# Patient Record
Sex: Male | Born: 1935
Health system: Southern US, Community
[De-identification: ages and names within clinical notes are randomized; demographics above are authoritative.]

## PROBLEM LIST (undated history)

## (undated) DIAGNOSIS — I2510 Atherosclerotic heart disease of native coronary artery without angina pectoris: Secondary | ICD-10-CM

## (undated) DIAGNOSIS — E785 Hyperlipidemia, unspecified: Secondary | ICD-10-CM

## (undated) DIAGNOSIS — I4891 Unspecified atrial fibrillation: Secondary | ICD-10-CM

## (undated) DIAGNOSIS — I10 Essential (primary) hypertension: Secondary | ICD-10-CM

## (undated) DIAGNOSIS — I495 Sick sinus syndrome: Secondary | ICD-10-CM

## (undated) DIAGNOSIS — R001 Bradycardia, unspecified: Secondary | ICD-10-CM

## (undated) HISTORY — DX: Atherosclerotic heart disease of native coronary artery without angina pectoris: I25.10

## (undated) HISTORY — DX: Bradycardia, unspecified: R00.1

## (undated) HISTORY — PX: CORONARY ARTERY BYPASS GRAFT: SHX141

## (undated) HISTORY — DX: Sick sinus syndrome: I49.5

## (undated) HISTORY — DX: Essential (primary) hypertension: I10

## (undated) HISTORY — DX: Hyperlipidemia, unspecified: E78.5

## (undated) HISTORY — PX: CARDIAC SURGERY: SHX584

## (undated) HISTORY — DX: Unspecified atrial fibrillation: I48.91

## (undated) HISTORY — PX: TONSILLECTOMY AND ADENOIDECTOMY: SUR1326

---

## 1999-03-16 ENCOUNTER — Encounter: Payer: Self-pay | Admitting: Internal Medicine

## 1999-03-16 ENCOUNTER — Inpatient Hospital Stay (HOSPITAL_COMMUNITY): Admission: EM | Admit: 1999-03-16 | Discharge: 1999-03-19 | Payer: Self-pay | Admitting: Emergency Medicine

## 1999-06-02 ENCOUNTER — Encounter (HOSPITAL_COMMUNITY): Admission: RE | Admit: 1999-06-02 | Discharge: 1999-08-31 | Payer: Self-pay | Admitting: *Deleted

## 1999-06-22 ENCOUNTER — Encounter: Payer: Self-pay | Admitting: Emergency Medicine

## 1999-06-22 ENCOUNTER — Inpatient Hospital Stay (HOSPITAL_COMMUNITY): Admission: EM | Admit: 1999-06-22 | Discharge: 1999-06-24 | Payer: Self-pay

## 1999-09-01 ENCOUNTER — Encounter (HOSPITAL_COMMUNITY): Admission: RE | Admit: 1999-09-01 | Discharge: 1999-09-12 | Payer: Self-pay | Admitting: *Deleted

## 1999-11-10 ENCOUNTER — Inpatient Hospital Stay (HOSPITAL_COMMUNITY): Admission: EM | Admit: 1999-11-10 | Discharge: 1999-11-12 | Payer: Self-pay | Admitting: Emergency Medicine

## 1999-11-16 ENCOUNTER — Encounter: Payer: Self-pay | Admitting: Surgery

## 1999-11-16 ENCOUNTER — Inpatient Hospital Stay (HOSPITAL_COMMUNITY): Admission: RE | Admit: 1999-11-16 | Discharge: 1999-11-21 | Payer: Self-pay | Admitting: Surgery

## 1999-11-17 ENCOUNTER — Encounter: Payer: Self-pay | Admitting: Surgery

## 1999-11-18 ENCOUNTER — Encounter: Payer: Self-pay | Admitting: Surgery

## 1999-12-22 ENCOUNTER — Encounter (HOSPITAL_COMMUNITY): Admission: RE | Admit: 1999-12-22 | Discharge: 2000-03-21 | Payer: Self-pay | Admitting: *Deleted

## 2001-05-20 ENCOUNTER — Emergency Department (HOSPITAL_COMMUNITY): Admission: EM | Admit: 2001-05-20 | Discharge: 2001-05-20 | Payer: Self-pay | Admitting: Emergency Medicine

## 2003-11-14 ENCOUNTER — Ambulatory Visit: Payer: Self-pay | Admitting: Psychology

## 2004-04-09 ENCOUNTER — Ambulatory Visit: Payer: Self-pay | Admitting: Internal Medicine

## 2004-04-14 ENCOUNTER — Ambulatory Visit: Payer: Self-pay | Admitting: *Deleted

## 2004-05-18 ENCOUNTER — Ambulatory Visit: Payer: Self-pay | Admitting: Internal Medicine

## 2004-05-25 ENCOUNTER — Ambulatory Visit: Payer: Self-pay | Admitting: Internal Medicine

## 2004-09-24 ENCOUNTER — Ambulatory Visit: Payer: Self-pay | Admitting: *Deleted

## 2004-09-29 ENCOUNTER — Ambulatory Visit: Payer: Self-pay | Admitting: *Deleted

## 2004-09-29 ENCOUNTER — Ambulatory Visit: Payer: Self-pay

## 2005-03-17 ENCOUNTER — Ambulatory Visit: Payer: Self-pay | Admitting: *Deleted

## 2005-03-17 ENCOUNTER — Ambulatory Visit: Payer: Self-pay | Admitting: Internal Medicine

## 2005-03-23 ENCOUNTER — Ambulatory Visit: Payer: Self-pay | Admitting: *Deleted

## 2005-05-04 ENCOUNTER — Ambulatory Visit: Payer: Self-pay | Admitting: Internal Medicine

## 2005-09-16 ENCOUNTER — Ambulatory Visit: Payer: Self-pay | Admitting: Internal Medicine

## 2005-09-20 ENCOUNTER — Ambulatory Visit: Payer: Self-pay | Admitting: *Deleted

## 2006-04-07 ENCOUNTER — Ambulatory Visit: Payer: Self-pay | Admitting: *Deleted

## 2006-04-12 ENCOUNTER — Ambulatory Visit: Payer: Self-pay | Admitting: *Deleted

## 2006-04-12 LAB — CONVERTED CEMR LAB
ALT: 20 units/L (ref 0–40)
AST: 24 units/L (ref 0–37)
Alkaline Phosphatase: 82 units/L (ref 39–117)
BUN: 12 mg/dL (ref 6–23)
Bilirubin, Direct: 0.2 mg/dL (ref 0.0–0.3)
CO2: 27 meq/L (ref 19–32)
Calcium: 9.5 mg/dL (ref 8.4–10.5)
Chloride: 106 meq/L (ref 96–112)
Cholesterol: 163 mg/dL (ref 0–200)
Creatinine, Ser: 1.1 mg/dL (ref 0.4–1.5)
Glucose, Bld: 101 mg/dL — ABNORMAL HIGH (ref 70–99)
Total Protein: 6.5 g/dL (ref 6.0–8.3)

## 2006-05-12 ENCOUNTER — Ambulatory Visit: Payer: Self-pay | Admitting: Internal Medicine

## 2006-06-09 ENCOUNTER — Ambulatory Visit: Payer: Self-pay | Admitting: *Deleted

## 2006-06-09 LAB — CONVERTED CEMR LAB
AST: 28 units/L (ref 0–37)
Albumin: 4 g/dL (ref 3.5–5.2)
Alkaline Phosphatase: 91 units/L (ref 39–117)
Total Bilirubin: 1.3 mg/dL — ABNORMAL HIGH (ref 0.3–1.2)
VLDL: 16 mg/dL (ref 0–40)

## 2006-07-12 ENCOUNTER — Ambulatory Visit: Payer: Self-pay | Admitting: Internal Medicine

## 2006-09-15 ENCOUNTER — Ambulatory Visit: Payer: Self-pay | Admitting: Internal Medicine

## 2006-09-22 ENCOUNTER — Ambulatory Visit: Payer: Self-pay

## 2006-09-22 LAB — CONVERTED CEMR LAB
AST: 31 units/L (ref 0–37)
Albumin: 4.4 g/dL (ref 3.5–5.2)
Bilirubin, Direct: 0.2 mg/dL (ref 0.0–0.3)
Calcium: 9.7 mg/dL (ref 8.4–10.5)
Chloride: 109 meq/L (ref 96–112)
Cholesterol: 149 mg/dL (ref 0–200)
GFR calc Af Amer: 93 mL/min
GFR calc non Af Amer: 77 mL/min
Glucose, Bld: 114 mg/dL — ABNORMAL HIGH (ref 70–99)
HDL: 61.9 mg/dL (ref >39.0)
LDL Cholesterol: 73 mg/dL (ref 0–99)
Sodium: 144 meq/L (ref 135–145)
Total CHOL/HDL Ratio: 2.4
VLDL: 14 mg/dL (ref 0–40)

## 2006-10-21 ENCOUNTER — Ambulatory Visit: Payer: Self-pay | Admitting: Internal Medicine

## 2006-10-21 LAB — CONVERTED CEMR LAB
BUN: 11 mg/dL (ref 6–23)
CO2: 27 meq/L (ref 19–32)
Chloride: 110 meq/L (ref 96–112)
Creatinine, Ser: 1.1 mg/dL (ref 0.4–1.5)

## 2006-12-01 ENCOUNTER — Telehealth: Payer: Self-pay | Admitting: Internal Medicine

## 2006-12-09 ENCOUNTER — Encounter: Payer: Self-pay | Admitting: Internal Medicine

## 2006-12-09 ENCOUNTER — Ambulatory Visit: Payer: Self-pay | Admitting: Internal Medicine

## 2006-12-09 DIAGNOSIS — I252 Old myocardial infarction: Secondary | ICD-10-CM

## 2006-12-09 DIAGNOSIS — E782 Mixed hyperlipidemia: Secondary | ICD-10-CM | POA: Insufficient documentation

## 2006-12-09 DIAGNOSIS — E785 Hyperlipidemia, unspecified: Secondary | ICD-10-CM

## 2006-12-09 DIAGNOSIS — I2510 Atherosclerotic heart disease of native coronary artery without angina pectoris: Secondary | ICD-10-CM | POA: Insufficient documentation

## 2006-12-09 DIAGNOSIS — R279 Unspecified lack of coordination: Secondary | ICD-10-CM

## 2006-12-09 DIAGNOSIS — I10 Essential (primary) hypertension: Secondary | ICD-10-CM

## 2006-12-15 ENCOUNTER — Encounter: Admission: RE | Admit: 2006-12-15 | Discharge: 2006-12-15 | Payer: Self-pay | Admitting: Internal Medicine

## 2006-12-15 ENCOUNTER — Encounter: Payer: Self-pay | Admitting: Internal Medicine

## 2006-12-20 ENCOUNTER — Telehealth (INDEPENDENT_AMBULATORY_CARE_PROVIDER_SITE_OTHER): Payer: Self-pay | Admitting: *Deleted

## 2006-12-22 ENCOUNTER — Telehealth: Payer: Self-pay | Admitting: Internal Medicine

## 2006-12-23 ENCOUNTER — Encounter: Payer: Self-pay | Admitting: Internal Medicine

## 2007-04-06 ENCOUNTER — Ambulatory Visit: Payer: Self-pay | Admitting: Internal Medicine

## 2007-04-07 ENCOUNTER — Ambulatory Visit: Payer: Self-pay | Admitting: Internal Medicine

## 2007-04-07 LAB — CONVERTED CEMR LAB
AST: 26 units/L (ref 0–37)
Albumin: 3.6 g/dL (ref 3.5–5.2)
BUN: 14 mg/dL (ref 6–23)
Calcium: 9.1 mg/dL (ref 8.4–10.5)
Chloride: 112 meq/L (ref 96–112)
Cholesterol: 154 mg/dL (ref 0–200)
Creatinine, Ser: 1 mg/dL (ref 0.4–1.5)
GFR calc Af Amer: 93 mL/min
GFR calc non Af Amer: 77 mL/min
HDL: 61.8 mg/dL (ref >39.0)
LDL Cholesterol: 81 mg/dL (ref 0–99)
Total Bilirubin: 0.9 mg/dL (ref 0.3–1.2)
Total CHOL/HDL Ratio: 2.5
Triglycerides: 54 mg/dL (ref 0–149)
VLDL: 11 mg/dL (ref 0–40)

## 2007-05-26 ENCOUNTER — Ambulatory Visit: Payer: Self-pay | Admitting: Internal Medicine

## 2007-05-26 DIAGNOSIS — I872 Venous insufficiency (chronic) (peripheral): Secondary | ICD-10-CM | POA: Insufficient documentation

## 2007-05-30 ENCOUNTER — Telehealth: Payer: Self-pay | Admitting: Internal Medicine

## 2007-06-12 ENCOUNTER — Ambulatory Visit: Payer: Self-pay | Admitting: Internal Medicine

## 2007-06-12 LAB — CONVERTED CEMR LAB
ALT: 19 units/L (ref 0–53)
Albumin: 3.9 g/dL (ref 3.5–5.2)
Bilirubin, Direct: 0.2 mg/dL (ref 0.0–0.3)
Cholesterol: 147 mg/dL (ref 0–200)
Total Protein: 6.7 g/dL (ref 6.0–8.3)
Triglycerides: 91 mg/dL (ref 0–149)
VLDL: 18 mg/dL (ref 0–40)

## 2007-07-19 ENCOUNTER — Telehealth (INDEPENDENT_AMBULATORY_CARE_PROVIDER_SITE_OTHER): Payer: Self-pay | Admitting: *Deleted

## 2007-07-20 ENCOUNTER — Ambulatory Visit: Payer: Self-pay | Admitting: Internal Medicine

## 2007-07-25 ENCOUNTER — Encounter: Payer: Self-pay | Admitting: Internal Medicine

## 2007-07-25 LAB — CONVERTED CEMR LAB: Creatinine, Urine: 72.8 mg/dL

## 2007-07-27 ENCOUNTER — Encounter: Payer: Self-pay | Admitting: Internal Medicine

## 2007-07-31 ENCOUNTER — Ambulatory Visit: Payer: Self-pay

## 2007-07-31 ENCOUNTER — Encounter: Payer: Self-pay | Admitting: Internal Medicine

## 2007-08-06 ENCOUNTER — Encounter: Payer: Self-pay | Admitting: Internal Medicine

## 2007-08-14 ENCOUNTER — Ambulatory Visit: Payer: Self-pay | Admitting: Internal Medicine

## 2007-08-14 LAB — CONVERTED CEMR LAB
Albumin: 4.1 g/dL (ref 3.5–5.2)
Bilirubin, Direct: 0.2 mg/dL (ref 0.0–0.3)
Cholesterol: 160 mg/dL (ref 0–200)
LDL Cholesterol: 88 mg/dL (ref 0–99)
Total CHOL/HDL Ratio: 3.2
Total Protein: 6.7 g/dL (ref 6.0–8.3)
Triglycerides: 106 mg/dL (ref 0–149)
VLDL: 21 mg/dL (ref 0–40)

## 2007-09-21 ENCOUNTER — Ambulatory Visit: Payer: Self-pay | Admitting: Internal Medicine

## 2007-09-28 ENCOUNTER — Ambulatory Visit: Payer: Self-pay | Admitting: Internal Medicine

## 2007-09-28 LAB — CONVERTED CEMR LAB
Chloride: 105 meq/L (ref 96–112)
GFR calc non Af Amer: 69 mL/min
Potassium: 4.3 meq/L (ref 3.5–5.1)

## 2007-12-21 ENCOUNTER — Telehealth (INDEPENDENT_AMBULATORY_CARE_PROVIDER_SITE_OTHER): Payer: Self-pay | Admitting: *Deleted

## 2007-12-25 ENCOUNTER — Ambulatory Visit: Payer: Self-pay | Admitting: Internal Medicine

## 2008-03-21 ENCOUNTER — Ambulatory Visit: Payer: Self-pay | Admitting: Internal Medicine

## 2008-03-21 ENCOUNTER — Encounter: Payer: Self-pay | Admitting: Internal Medicine

## 2008-03-21 DIAGNOSIS — I498 Other specified cardiac arrhythmias: Secondary | ICD-10-CM

## 2008-03-29 ENCOUNTER — Ambulatory Visit: Payer: Self-pay | Admitting: Internal Medicine

## 2008-04-02 LAB — CONVERTED CEMR LAB
AST: 29 units/L (ref 0–37)
Albumin: 4 g/dL (ref 3.5–5.2)
CO2: 31 meq/L (ref 19–32)
Chloride: 106 meq/L (ref 96–112)
HDL: 53.8 mg/dL (ref >39.00)
LDL Cholesterol: 88 mg/dL (ref 0–99)
Sodium: 144 meq/L (ref 135–145)
Total CHOL/HDL Ratio: 3
Triglycerides: 75 mg/dL (ref 0.0–149.0)
VLDL: 15 mg/dL (ref 0.0–40.0)

## 2008-05-16 ENCOUNTER — Telehealth (INDEPENDENT_AMBULATORY_CARE_PROVIDER_SITE_OTHER): Payer: Self-pay | Admitting: *Deleted

## 2008-06-16 ENCOUNTER — Inpatient Hospital Stay (HOSPITAL_COMMUNITY): Admission: EM | Admit: 2008-06-16 | Discharge: 2008-06-17 | Payer: Self-pay | Admitting: Emergency Medicine

## 2008-06-16 ENCOUNTER — Ambulatory Visit: Payer: Self-pay | Admitting: Cardiology

## 2008-06-16 ENCOUNTER — Telehealth: Payer: Self-pay | Admitting: Adult Health

## 2008-06-18 ENCOUNTER — Ambulatory Visit: Payer: Self-pay | Admitting: Cardiology

## 2008-06-24 ENCOUNTER — Ambulatory Visit: Payer: Self-pay | Admitting: Cardiology

## 2008-06-24 ENCOUNTER — Encounter (INDEPENDENT_AMBULATORY_CARE_PROVIDER_SITE_OTHER): Payer: Self-pay | Admitting: Cardiology

## 2008-06-24 LAB — CONVERTED CEMR LAB: POC INR: 1.9

## 2008-06-27 ENCOUNTER — Ambulatory Visit: Payer: Self-pay | Admitting: Internal Medicine

## 2008-06-27 DIAGNOSIS — I4891 Unspecified atrial fibrillation: Secondary | ICD-10-CM

## 2008-07-01 ENCOUNTER — Ambulatory Visit: Payer: Self-pay | Admitting: Internal Medicine

## 2008-07-01 LAB — CONVERTED CEMR LAB
POC INR: 5.8
Protime: 29.4

## 2008-07-08 ENCOUNTER — Ambulatory Visit: Payer: Self-pay | Admitting: Cardiovascular Disease

## 2008-07-11 ENCOUNTER — Telehealth: Payer: Self-pay | Admitting: Internal Medicine

## 2008-07-16 ENCOUNTER — Encounter (INDEPENDENT_AMBULATORY_CARE_PROVIDER_SITE_OTHER): Payer: Self-pay | Admitting: Cardiology

## 2008-07-16 ENCOUNTER — Ambulatory Visit: Payer: Self-pay | Admitting: Cardiology

## 2008-07-16 LAB — CONVERTED CEMR LAB: POC INR: 2.2

## 2008-07-23 ENCOUNTER — Ambulatory Visit: Payer: Self-pay | Admitting: Internal Medicine

## 2008-07-23 LAB — CONVERTED CEMR LAB
Albumin: 4 g/dL (ref 3.5–5.2)
BUN: 13 mg/dL (ref 6–23)
Bilirubin, Direct: 0.2 mg/dL (ref 0.0–0.3)
Chloride: 106 meq/L (ref 96–112)
Cholesterol: 153 mg/dL (ref 0–200)
LDL Cholesterol: 74 mg/dL (ref 0–99)
Potassium: 5.5 meq/L — ABNORMAL HIGH (ref 3.5–5.1)
TSH: 1.03 microintl units/mL (ref 0.35–5.50)
Total Protein: 6.9 g/dL (ref 6.0–8.3)
VLDL: 20.6 mg/dL (ref 0.0–40.0)

## 2008-08-01 ENCOUNTER — Ambulatory Visit: Payer: Self-pay | Admitting: Cardiovascular Disease

## 2008-08-01 LAB — CONVERTED CEMR LAB
POC INR: 2
Prothrombin Time: 17.3 s

## 2008-08-07 ENCOUNTER — Ambulatory Visit: Payer: Self-pay | Admitting: Internal Medicine

## 2008-08-22 ENCOUNTER — Ambulatory Visit: Payer: Self-pay | Admitting: Internal Medicine

## 2008-09-04 ENCOUNTER — Ambulatory Visit: Payer: Self-pay | Admitting: Internal Medicine

## 2008-09-19 ENCOUNTER — Ambulatory Visit: Payer: Self-pay | Admitting: Cardiology

## 2008-10-17 ENCOUNTER — Ambulatory Visit: Payer: Self-pay | Admitting: Cardiology

## 2008-10-17 LAB — CONVERTED CEMR LAB: POC INR: 2.8

## 2008-11-14 ENCOUNTER — Ambulatory Visit: Payer: Self-pay | Admitting: Internal Medicine

## 2008-12-12 ENCOUNTER — Ambulatory Visit: Payer: Self-pay | Admitting: Cardiology

## 2009-01-09 ENCOUNTER — Encounter (INDEPENDENT_AMBULATORY_CARE_PROVIDER_SITE_OTHER): Payer: Self-pay | Admitting: Cardiology

## 2009-01-09 ENCOUNTER — Ambulatory Visit: Payer: Self-pay | Admitting: Cardiovascular Disease

## 2009-01-09 LAB — CONVERTED CEMR LAB: POC INR: 2.7

## 2009-01-29 ENCOUNTER — Ambulatory Visit: Payer: Self-pay | Admitting: Cardiology

## 2009-01-29 LAB — CONVERTED CEMR LAB: POC INR: 2.6

## 2009-02-18 ENCOUNTER — Ambulatory Visit: Payer: Self-pay | Admitting: Internal Medicine

## 2009-02-19 ENCOUNTER — Ambulatory Visit: Payer: Self-pay | Admitting: Internal Medicine

## 2009-02-26 LAB — CONVERTED CEMR LAB
Albumin: 3.9 g/dL (ref 3.5–5.2)
Chloride: 102 meq/L (ref 96–112)
Cholesterol: 173 mg/dL (ref 0–200)
HDL: 63.7 mg/dL (ref >39.00)
LDL Cholesterol: 96 mg/dL (ref 0–99)
Potassium: 4.6 meq/L (ref 3.5–5.1)
Sodium: 140 meq/L (ref 135–145)
Total Protein: 6.6 g/dL (ref 6.0–8.3)
Triglycerides: 69 mg/dL (ref 0.0–149.0)
VLDL: 13.8 mg/dL (ref 0.0–40.0)

## 2009-02-27 ENCOUNTER — Ambulatory Visit: Payer: Self-pay | Admitting: Cardiology

## 2009-02-27 ENCOUNTER — Telehealth: Payer: Self-pay | Admitting: Internal Medicine

## 2009-03-07 ENCOUNTER — Telehealth: Payer: Self-pay | Admitting: Internal Medicine

## 2009-03-18 ENCOUNTER — Telehealth: Payer: Self-pay | Admitting: Internal Medicine

## 2009-03-27 ENCOUNTER — Encounter (INDEPENDENT_AMBULATORY_CARE_PROVIDER_SITE_OTHER): Payer: Self-pay | Admitting: Cardiology

## 2009-03-27 ENCOUNTER — Ambulatory Visit: Payer: Self-pay | Admitting: Internal Medicine

## 2009-03-27 ENCOUNTER — Ambulatory Visit: Payer: Self-pay | Admitting: Cardiology

## 2009-03-27 LAB — CONVERTED CEMR LAB: POC INR: 2.7

## 2009-04-02 ENCOUNTER — Telehealth: Payer: Self-pay | Admitting: Internal Medicine

## 2009-04-17 ENCOUNTER — Ambulatory Visit: Payer: Self-pay | Admitting: Cardiology

## 2009-04-17 LAB — CONVERTED CEMR LAB: POC INR: 2

## 2009-05-07 ENCOUNTER — Telehealth: Payer: Self-pay | Admitting: Internal Medicine

## 2009-05-08 ENCOUNTER — Ambulatory Visit: Payer: Self-pay | Admitting: Cardiology

## 2009-06-05 ENCOUNTER — Ambulatory Visit: Payer: Self-pay | Admitting: Internal Medicine

## 2009-07-01 ENCOUNTER — Telehealth: Payer: Self-pay | Admitting: Internal Medicine

## 2009-07-03 ENCOUNTER — Ambulatory Visit: Payer: Self-pay | Admitting: Cardiology

## 2009-08-07 ENCOUNTER — Ambulatory Visit: Payer: Self-pay | Admitting: Cardiology

## 2009-08-12 ENCOUNTER — Telehealth: Payer: Self-pay | Admitting: Internal Medicine

## 2009-08-13 ENCOUNTER — Ambulatory Visit: Payer: Self-pay | Admitting: Internal Medicine

## 2009-08-13 LAB — CONVERTED CEMR LAB
ALT: 23 units/L (ref 0–53)
AST: 27 units/L (ref 0–37)
BUN: 13 mg/dL (ref 6–23)
Bilirubin, Direct: 0.2 mg/dL (ref 0.0–0.3)
CO2: 28 meq/L (ref 19–32)
Chloride: 102 meq/L (ref 96–112)
Creatinine, Ser: 1 mg/dL (ref 0.4–1.5)
Potassium: 3.9 meq/L (ref 3.5–5.1)
Total CHOL/HDL Ratio: 3
Total Protein: 6.4 g/dL (ref 6.0–8.3)
Triglycerides: 116 mg/dL (ref 0.0–149.0)

## 2009-08-14 ENCOUNTER — Ambulatory Visit: Payer: Self-pay | Admitting: Internal Medicine

## 2009-08-15 ENCOUNTER — Encounter: Payer: Self-pay | Admitting: Internal Medicine

## 2009-08-22 ENCOUNTER — Telehealth: Payer: Self-pay | Admitting: Internal Medicine

## 2009-08-22 DIAGNOSIS — H9190 Unspecified hearing loss, unspecified ear: Secondary | ICD-10-CM | POA: Insufficient documentation

## 2009-09-01 ENCOUNTER — Encounter: Payer: Self-pay | Admitting: Internal Medicine

## 2009-09-04 ENCOUNTER — Ambulatory Visit: Payer: Self-pay | Admitting: Internal Medicine

## 2009-09-23 ENCOUNTER — Ambulatory Visit: Payer: Self-pay | Admitting: Internal Medicine

## 2009-09-23 ENCOUNTER — Ambulatory Visit: Payer: Self-pay

## 2009-10-02 ENCOUNTER — Ambulatory Visit: Payer: Self-pay | Admitting: Internal Medicine

## 2009-10-02 LAB — CONVERTED CEMR LAB: POC INR: 2.2

## 2009-10-06 ENCOUNTER — Telehealth: Payer: Self-pay | Admitting: Internal Medicine

## 2009-11-12 ENCOUNTER — Ambulatory Visit: Payer: Self-pay | Admitting: Cardiology

## 2009-11-12 LAB — CONVERTED CEMR LAB: POC INR: 2.7

## 2009-12-22 ENCOUNTER — Encounter: Payer: Self-pay | Admitting: Internal Medicine

## 2009-12-24 ENCOUNTER — Ambulatory Visit: Payer: Self-pay | Admitting: Cardiology

## 2009-12-24 LAB — CONVERTED CEMR LAB: POC INR: 2.3

## 2010-02-02 ENCOUNTER — Telehealth: Payer: Self-pay | Admitting: Internal Medicine

## 2010-02-04 ENCOUNTER — Encounter: Payer: Self-pay | Admitting: Internal Medicine

## 2010-02-04 ENCOUNTER — Ambulatory Visit: Admission: RE | Admit: 2010-02-04 | Discharge: 2010-02-04 | Payer: Self-pay | Source: Home / Self Care

## 2010-02-04 ENCOUNTER — Ambulatory Visit
Admission: RE | Admit: 2010-02-04 | Discharge: 2010-02-04 | Payer: Self-pay | Source: Home / Self Care | Attending: Internal Medicine | Admitting: Internal Medicine

## 2010-02-10 NOTE — Progress Notes (Signed)
Summary: questions re lipator   Phone Note Call from Patient Call back at Home Phone [phone]   Caller: Patient [phone] Reason for Call: Talk to Nurse Summary of Call: questions re lipator Initial call taken by: Glynda Jaeger,  July 01, 2009 4:10 PM  Follow-up for Phone Call        wanted to know when med was going to be generic and if he could switch advised was going generic in the fall and we will be happy to switch him at that time Meredith Staggers, RN  July 01, 2009 4:28 PM

## 2010-02-10 NOTE — Medication Information (Signed)
Summary: rov/ewj  Anticoagulant Therapy  Managed by: Cloyde Reams, RN, BSN Referring MD: Gala Romney MD, Reuel Boom PCP: Link Snuffer MD: Myrtis Ser MD, Tinnie Gens Indication 1: Atrial Fibrillation Lab Used: [facility] [place] Site: [address] INR POC 3.4 INR RANGE 2.0-3.0  Dietary changes: no    Health status changes: no    Bleeding/hemorrhagic complications: no    Recent/future hospitalizations: no    Any changes in medication regimen? no    Recent/future dental: no  Any missed doses?: no       Is patient compliant with meds? yes       Allergies (verified): No Known Drug Allergies  Anticoagulation Management History:      The patient is taking warfarin and comes in today for a routine follow up visit.  Anticoagulation is being administered due to chronic atrial fibrillation.  Positive risk factors for bleeding include an age of 40 years or older.  Negative risk factors for bleeding include no history of CVA/TIA, no history of GI bleeding, and absence of serious comorbidities.  The bleeding index is 'intermediate risk'.  Positive CHADS2 values include History of HTN and Age > 4 years old.  Negative CHADS2 values include History of CHF, History of Diabetes, and Prior Stroke/CVA/TIA.  The start date was 06/18/2008.  His last INR was 2.  Anticoagulation responsible provider: Myrtis Ser MD, Tinnie Gens.  INR POC: 3.4.  Cuvette Lot#: [id number].  Exp: 04/2010.    Anticoagulation Management Assessment/Plan:      The patient's current anticoagulation dose is Warfarin sodium 2.5 mg tabs: Use as directed by Anticoagualtion Clinic.  The target INR is 2.0-3.0.  The next INR is due 03/27/2009.  Anticoagulation instructions were given to patient.  Results were reviewed/authorized by Cloyde Reams, RN, BSN.  He was notified by Cloyde Reams RN.         Prior Anticoagulation Instructions: INR 2.6  Continue on same dosage 1 tablet except 2 tablets on Tuesdays and Saturdays.  Recheck in 4  weeks.    Current Anticoagulation Instructions: INR 3.4  Skip tonight's dosage of coumadin, then resume same dosage 1 tablet daily except 2 tablets on Tuesdays and Saturdays.   Recheck in 3-4 weeks.

## 2010-02-10 NOTE — Medication Information (Signed)
Summary: rov/ewj  Anticoagulant Therapy  Managed by: Weston Brass, PharmD Referring MD: Gala Romney MD, Reuel Boom PCP: Debby [name] Supervising MD: Tenny Craw MD, Gunnar Fusi Indication 1: Atrial Fibrillation Lab Used: [facility] [place] Site: [address] INR POC 2.6 INR RANGE 2.0-3.0  Dietary changes: no    Health status changes: no    Bleeding/hemorrhagic complications: no    Recent/future hospitalizations: no    Any changes in medication regimen? no    Recent/future dental: no  Any missed doses?: no       Is patient compliant with meds? yes       Allergies: No Known Drug Allergies  Anticoagulation Management History:      The patient is taking warfarin and comes in today for a routine follow up visit.  He is being anticoagulated due to chronic atrial fibrillation.  Positive risk factors for bleeding include an age of 75 years or older.  Negative risk factors for bleeding include no history of CVA/TIA, no history of GI bleeding, and absence of serious comorbidities.  The bleeding index is 'intermediate risk'.  Positive CHADS2 values include History of HTN and Age > 21 years old.  Negative CHADS2 values include History of CHF, History of Diabetes, and Prior Stroke/CVA/TIA.  The start date was 06/18/2008.  His last INR was 2.  Anticoagulation responsible Macaria Bias: Tenny Craw MD, Gunnar Fusi.  INR POC: 2.6.  Cuvette Lot#: [id number].  Exp: 10/2010.    Anticoagulation Management Assessment/Plan:      The patient's current anticoagulation dose is Warfarin sodium 2.5 mg tabs: Use as directed by Anticoagualtion Clinic.  The target INR is 2.0-3.0.  The next INR is due 10/02/2009.  Anticoagulation instructions were given to patient.  Results were reviewed/authorized by Weston Brass, PharmD.  He was notified by Weston Brass PharmD.         Prior Anticoagulation Instructions: INR 2.3  Continue on same dosage 1 tablet daily except 2 tablets on Tuesdays.  Recheck in 4 weeks.    Current Anticoagulation  Instructions: INR 2.6  Continue same dose of 1 tablet every day except 2 tablets on Tuesday.  Recheck INR in 4 weeks.

## 2010-02-10 NOTE — Medication Information (Signed)
Summary: rov.m  Anticoagulant Therapy  Managed by: Cloyde Reams, RN, BSN Referring MD: Gala Romney MD, Reuel Boom PCP: Link Snuffer MD: Myrtis Ser MD, Tinnie Gens Indication 1: Atrial Fibrillation Lab Used: [facility] [place] Site: [address] INR POC 2.6 INR RANGE 2.0-3.0  Dietary changes: no    Health status changes: no    Bleeding/hemorrhagic complications: no    Recent/future hospitalizations: no    Any changes in medication regimen? no    Recent/future dental: no  Any missed doses?: no       Is patient compliant with meds? yes       Allergies (verified): No Known Drug Allergies  Anticoagulation Management History:      The patient is taking warfarin and comes in today for a routine follow up visit.  Warfarin therapy is being given due to chronic atrial fibrillation.  Positive risk factors for bleeding include an age of 75 years or older.  Negative risk factors for bleeding include no history of CVA/TIA, no history of GI bleeding, and absence of serious comorbidities.  The bleeding index is 'intermediate risk'.  Positive CHADS2 values include History of HTN and Age > 75 years old.  Negative CHADS2 values include History of CHF, History of Diabetes, and Prior Stroke/CVA/TIA.  The start date was 06/18/2008.  His last INR was 2.  Anticoagulation responsible provider: Myrtis Ser MD, Tinnie Gens.  INR POC: 2.6.  Cuvette Lot#: [id number].  Exp: 04/2010.    Anticoagulation Management Assessment/Plan:      The patient's current anticoagulation dose is Warfarin sodium 2.5 mg tabs: Use as directed by Anticoagualtion Clinic.  The target INR is 2.0-3.0.  The next INR is due 02/27/2009.  Anticoagulation instructions were given to patient.  Results were reviewed/authorized by Cloyde Reams, RN, BSN.  He was notified by Cloyde Reams RN.         Prior Anticoagulation Instructions: INR 2.7  Continue 1 tab daily except 2 tabs each Tuesday and Saturday.  Recheck prior to trip.    Current  Anticoagulation Instructions: INR 2.6  Continue on same dosage 1 tablet except 2 tablets on Tuesdays and Saturdays.  Recheck in 4 weeks.

## 2010-02-10 NOTE — Progress Notes (Signed)
Summary: elevated bp   Phone Note Call from Patient Call back at Home Phone [phone]   Caller: Patient Reason for Call: Talk to Nurse Summary of Call: pt states  bp is elevated, needs to know what to do Initial call taken by: Migdalia Dk,  March 18, 2009 1:52 PM  Follow-up for Phone Call        BP today was 161, for past few weeks BP has been ranging from 139-150, will discuss w/Dr Bensimhon and call back tom Meredith Staggers, RN  March 18, 2009 5:42 PM   Additional Follow-up for Phone Call Additional follow up Details #1::        Potassium has been up in past. Thus not good candidate for ACE-I or ARB. Would start amlodipine 2.5 once daily Dolores Patty, MD, [facility]  March 18, 2009 8:11 PM  Left message to call back Meredith Staggers, RN  March 19, 2009 11:59 AM     Additional Follow-up for Phone Call Additional follow up Details #2::    spoke w/pt, he states that his wife had a physical for insurance purposes today and he had them check his BP as well and it was 126/78 he turned right around and checked it w/his home cuff and it was high a very different reading, so he is confused, advised pt to bring cuff to his coumadin appt next week and we will also sch him a nurse visit to compare his cuff and go from there, pt agreeable Meredith Staggers, RN  March 19, 2009 4:46 PM

## 2010-02-10 NOTE — Progress Notes (Signed)
Summary: pt wants to lab work at visit tomorrow   Phone Note Call from Patient Call back at [facility] Phone [phone]   Caller: Patient Reason for Call: Talk to Nurse, Talk to Doctor Summary of Call: pt wants to have lab work a lipid  prior to his appt on tomorrow pls let him know today so he will know if he can eat or not Initial call taken by: Omer Jack,  August 12, 2009 11:51 AM  Follow-up for Phone Call        spoke w/pt he will come fasting tom Meredith Staggers, RN  August 12, 2009 12:42 PM

## 2010-02-10 NOTE — Medication Information (Signed)
Summary: Larry Lopez  Anticoagulant Therapy  Managed by: Cloyde Reams, RN, BSN Referring MD: Gala Romney MD, Reuel Boom PCP: Link Snuffer MD: Shirlee Latch MD, Dalton Indication 1: Atrial Fibrillation Lab Used: [facility] [place] Site: [address] INR POC 2.3 INR RANGE 2.0-3.0  Dietary changes: no    Health status changes: no    Bleeding/hemorrhagic complications: yes       Details: Incr bruising  Recent/future hospitalizations: no    Any changes in medication regimen? no    Recent/future dental: no  Any missed doses?: no       Is patient compliant with meds? yes       Allergies: No Known Drug Allergies  Anticoagulation Management History:      The patient is taking warfarin and comes in today for a routine follow up visit.  The patient is on warfarin for chronic atrial fibrillation.  Positive risk factors for bleeding include an age of 33 years or older.  Negative risk factors for bleeding include no history of CVA/TIA, no history of GI bleeding, and absence of serious comorbidities.  The bleeding index is 'intermediate risk'.  Positive CHADS2 values include History of HTN and Age > 49 years old.  Negative CHADS2 values include History of CHF, History of Diabetes, and Prior Stroke/CVA/TIA.  The start date was 06/18/2008.  His last INR was 2.  Anticoagulation responsible provider: Shirlee Latch MD, Dalton.  INR POC: 2.3.  Cuvette Lot#: [id number].  Exp: 10/2010.    Anticoagulation Management Assessment/Plan:      The patient's current anticoagulation dose is Warfarin sodium 2.5 mg tabs: Use as directed by Anticoagualtion Clinic.  The target INR is 2.0-3.0.  The next INR is due 09/04/2009.  Anticoagulation instructions were given to patient.  Results were reviewed/authorized by Cloyde Reams, RN, BSN.  He was notified by Cloyde Reams RN.         Prior Anticoagulation Instructions: INR 2.2  Continue on same dosage 1 tablet daily except 2 tablets on Tuesdays. Return in 4 weeks.     Current Anticoagulation Instructions: INR 2.3  Continue on same dosage 1 tablet daily except 2 tablets on Tuesdays.  Recheck in 4 weeks.

## 2010-02-10 NOTE — Therapy (Signed)
Summary: [facility]  [facility]   Imported By: Sherian Rein 10/02/2009 09:42:37  _____________________________________________________________________  External Attachment:    Type:   Image     Comment:   External Document

## 2010-02-10 NOTE — Progress Notes (Signed)
Summary: Coumadin f/u every 6 weeks   ---- Converted from flag ---- ---- 10/02/2009 7:01 PM, Dolores Patty, MD, [facility] wrote: ok by me.  ---- 10/02/2009 9:31 AM, Eda Keys wrote: Larry Lopez was seen today in coumadin clinic with therapeutic INR of 2.2.  He has been in range since 06/2008 with the exception of one INR of 3.4 and one INR of 5.8 back in 2010.  We have been seeing him monthly, but if possible patient would like to be placed on a 6 week schedule with the understanding that if he falls out of range he will need to be seen sooner.  Do you approve of a monitoring change to every 6 weeks?  Thank you! ------------------------------  Phone Note Call from Patient   Caller: Patient Summary of Call: Pt called to see if Dr. Gala Romney had responded to message.  Informed pt okay with Dr. Gala Romney to f/u every 6 weeks.  Appt made for 6 weeks from prior INR check.  Initial call taken by: Weston Brass PharmD,  October 06, 2009 11:59 AM

## 2010-02-10 NOTE — Miscellaneous (Signed)
Summary: [facility]  [facility]   Imported By: Lester Stratford 08/22/2009 10:32:42  _____________________________________________________________________  External Attachment:    Type:   Image     Comment:   External Document

## 2010-02-10 NOTE — Medication Information (Signed)
Summary: rov/eac   Anticoagulant Therapy  Managed by: Lyna Poser, PharmD Referring MD: Gala Romney MD, Reuel Boom PCP: Debby [name] Supervising MD: Clifton James MD,Christopher Indication 1: Atrial Fibrillation Lab Used: [facility] [place] Site: [address] INR POC 2.7 INR RANGE 2.0-3.0  Dietary changes: no    Health status changes: no    Bleeding/hemorrhagic complications: no    Recent/future hospitalizations: no    Any changes in medication regimen? no    Recent/future dental: no  Any missed doses?: no       Is patient compliant with meds? yes       Allergies: No Known Drug Allergies  Anticoagulation Management History:      The patient is taking warfarin and comes in today for a routine follow up visit.  He is being anticoagulated because of chronic atrial fibrillation.  Positive risk factors for bleeding include an age of 6 years or older.  Negative risk factors for bleeding include no history of CVA/TIA, no history of GI bleeding, and absence of serious comorbidities.  The bleeding index is 'intermediate risk'.  Positive CHADS2 values include History of HTN and Age > 58 years old.  Negative CHADS2 values include History of CHF, History of Diabetes, and Prior Stroke/CVA/TIA.  The start date was 06/18/2008.  His last INR was 2.  Anticoagulation responsible provider: Clifton James MD,Christopher.  INR POC: 2.7.  Cuvette Lot#: [id number].  Exp: 11/2010.    Anticoagulation Management Assessment/Plan:      The patient's current anticoagulation dose is Warfarin sodium 2.5 mg tabs: Use as directed by Anticoagualtion Clinic.  The target INR is 2.0-3.0.  The next INR is due 12/24/2009.  Anticoagulation instructions were given to patient.  Results were reviewed/authorized by Lyna Poser, PharmD.         Prior Anticoagulation Instructions: INR 2.2  Continue taking 1 tablet everyday, except take 2 tablets on Tuesday.  Return in 4 weeks unless approved by Dr. Gala Romney.  Will call  you to reschedule if able.   Current Anticoagulation Instructions: INR 2.7  Continue taking 2 tablets on tuesday. And 1 tablet all other days. Recheck in 6 weeks.

## 2010-02-10 NOTE — Progress Notes (Signed)
Summary: REFERRAL  Phone Note Call from Patient Call back at Home Phone [phone]   Caller: Patient Call For: Dr Debby [name] Summary of Call: Pt requesting referral for hearing evaluation. [facility] -[address] near [place] [phone] phone.  Initial call taken by: Verdell Face,  August 22, 2009 9:24 AM  Follow-up for Phone Call        OK for eval. [facility] notified Follow-up by: Jacques Navy MD,  August 22, 2009 2:30 PM  New Problems: HEARING LOSS (ICD-389.9)   New Problems: HEARING LOSS (ICD-389.9)

## 2010-02-10 NOTE — Medication Information (Signed)
Summary: rov/cb  Anticoagulant Therapy  Managed by: Bethena Midget, RN, BSN Referring MD: Gala Romney MD, Reuel Boom PCP: Link Snuffer MD: Myrtis Ser MD, Tinnie Gens Indication 1: Atrial Fibrillation Lab Used: [facility] [place] Site: [address] INR POC 2.1 INR RANGE 2.0-3.0  Dietary changes: no    Health status changes: no    Bleeding/hemorrhagic complications: no    Recent/future hospitalizations: no    Any changes in medication regimen? no    Recent/future dental: no  Any missed doses?: no       Is patient compliant with meds? yes      Comments: Pending eye surgery need off betweeen 5-7 days per Dr. Priscille Kluver, sees them on 06/11/09 for pre-op.   Allergies: No Known Drug Allergies  Anticoagulation Management History:      The patient is taking warfarin and comes in today for a routine follow up visit.  The patient is taking warfarin for chronic atrial fibrillation.  Positive risk factors for bleeding include an age of 75 years or older.  Negative risk factors for bleeding include no history of CVA/TIA, no history of GI bleeding, and absence of serious comorbidities.  The bleeding index is 'intermediate risk'.  Positive CHADS2 values include History of HTN and Age > 75 years old.  Negative CHADS2 values include History of CHF, History of Diabetes, and Prior Stroke/CVA/TIA.  The start date was 06/18/2008.  His last INR was 2.  Anticoagulation responsible provider: Myrtis Ser MD, Tinnie Gens.  INR POC: 2.1.  Cuvette Lot#: [id number].  Exp: 06/2010.    Anticoagulation Management Assessment/Plan:      The patient's current anticoagulation dose is Warfarin sodium 2.5 mg tabs: Use as directed by Anticoagualtion Clinic.  The target INR is 2.0-3.0.  The next INR is due 06/05/2009.  Anticoagulation instructions were given to patient.  Results were reviewed/authorized by Bethena Midget, RN, BSN.  He was notified by Bethena Midget, RN, BSN.         Prior Anticoagulation Instructions: INR 2.0.  Take 1 tablet daily except 2 tablets on Tues. Recheck in 3 weeks.  Current Anticoagulation Instructions: INR 2.1 Continue 2.5mg s daily except 5mg s on Tuesdays. Recheck in 4 weeks.

## 2010-02-10 NOTE — Progress Notes (Signed)
Summary: eye surgery/coumadin   Phone Note Call from Patient Call back at Home Phone [phone]   Summary of Call: can pt stop his coumadin for 1 week for eye surgery Initial call taken by: Migdalia Dk,  May 07, 2009 1:42 PM  Follow-up for Phone Call        [facility] opthamology, ? need laser surgery in June for narrow angle glaucoma and MD ask if pt can be off coumadin for 1 week, will send to Dr Gala Romney for review Meredith Staggers, RN  May 07, 2009 2:14 PM   Additional Follow-up for Phone Call Additional follow up Details #1::        that is ok Dolores Patty, MD, [facility]  May 07, 2009 6:06 PM  pt is aware, will send mess to coumadin clinic Meredith Staggers, RN  May 08, 2009 9:20 AM

## 2010-02-10 NOTE — Medication Information (Signed)
Summary: rov/ewj  Anticoagulant Therapy  Managed by: Elaina Pattee, PharmD Referring MD: Gala Romney MD, Reuel Boom PCP: Link Snuffer MD: Shirlee Latch MD, Charell Faulk Indication 1: Atrial Fibrillation Lab Used: [facility] [place] Site: [address] INR POC 2.0 INR RANGE 2.0-3.0  Dietary changes: yes       Details: Drinks 3 glasses of wine per day (has a wine cellar).  Health status changes: no    Bleeding/hemorrhagic complications: no    Recent/future hospitalizations: no    Any changes in medication regimen? yes       Details: Amlodipine added for BP.   Recent/future dental: no  Any missed doses?: no       Is patient compliant with meds? yes       Allergies: No Known Drug Allergies  Anticoagulation Management History:      The patient is taking warfarin and comes in today for a routine follow up visit.  The patient is taking warfarin for chronic atrial fibrillation.  Positive risk factors for bleeding include an age of 29 years or older.  Negative risk factors for bleeding include no history of CVA/TIA, no history of GI bleeding, and absence of serious comorbidities.  The bleeding index is 'intermediate risk'.  Positive CHADS2 values include History of HTN and Age > 54 years old.  Negative CHADS2 values include History of CHF, History of Diabetes, and Prior Stroke/CVA/TIA.  The start date was 06/18/2008.  His last INR was 2.  Anticoagulation responsible provider: Shirlee Latch MD, Leighanne Adolph.  INR POC: 2.0.  Cuvette Lot#: [id number].  Exp: 05/2010.    Anticoagulation Management Assessment/Plan:      The patient's current anticoagulation dose is Warfarin sodium 2.5 mg tabs: Use as directed by Anticoagualtion Clinic.  The target INR is 2.0-3.0.  The next INR is due 05/08/2009.  Anticoagulation instructions were given to patient.  Results were reviewed/authorized by Elaina Pattee, PharmD.  He was notified by Elaina Pattee, PharmD.         Prior Anticoagulation Instructions: INR  2.7  Take 2 tabs each Tuesday, and 1 tab on all other days because INR is trending upwards.  Recheck in 3 - 4 weeks.   Current Anticoagulation Instructions: INR 2.0. Take 1 tablet daily except 2 tablets on Tues. Recheck in 3 weeks.

## 2010-02-10 NOTE — Assessment & Plan Note (Signed)
Summary: rov per pt call/lg      Allergies Added: NKDA  Primary Provider:  Norins  CC:  follow up.  History of Present Illness: Mr. Larry Lopez is a very pleasant 77 year old male with a history of a coronary artery disease status post redo bypass surgery in 2002. He also has a history of hypertension and hyperlipidemia, PAF complicated by  tachy-brady syndrome with significant resting bradycardia prohibiting AV-Nodal blockade. Was hospitalized in 6/10 with rapid AF.  He saw Dr. Graciela Husbands last year for consideration of pacemaker but it was thought that as AF is infrequent and self-limiting and his bradycardia is asymptomatic  that we would watch it for now and avoid AV-nodal blockers due to bradycardia. IF AF burden increased would then consider pacing and re-insitutuion of AV-nodal blockers.  He returns for routine f/u. He is doing great. [facility] least 4 times a week and aerobics classes and treadmill. He feels great.  He denies any chest pain or shortness of breath no lower extremity edema. With exercise gets up to 112bpm. No palpitations. Tolerating coumadin well. no bleeding. SP 119-135 generally. HRs 52-60.    Current Medications (verified): 1)  Chlorthalidone 25 Mg Tabs (Chlorthalidone) .... Take One Tablet Once Daily 2)  Calcium 600 1500 Mg  Tabs (Calcium Carbonate) .... Once Daily 3)  Isopto Carpine 1 %  Soln (Pilocarpine Hcl) .... Three Times A Day 4)  Lipitor 40 Mg  Tabs (Atorvastatin Calcium) .[name].. 1 By Mouth Once Daily 5)  Multivitamins   Tabs (Multiple Vitamin) .[name].. 1 By Mouth Once Daily 6)  Nitroquick 0.4 Mg  Subl (Nitroglycerin) .... Prn 7)  Warfarin Sodium 2.5 Mg Tabs (Warfarin Sodium) .... Use As Directed By Anticoagualtion Clinic 8)  Metoprolol Tartrate 25 Mg Tabs (Metoprolol Tartrate) .... Take 1 As Needed For Tachycardia  Allergies (verified): No Known Drug Allergies  Past History:  Past Medical History:  1.  CAD      a.   s/p re-do bypass  in 2001. Normal LV function      b.   LIMA to LAD, SVG to OM1 and 2, diagonal, and PDA.      c.   ETT/Myoview 9/08 EF 65% normal perfusion   2.  HTN  3.  Hyperlipidemia  4.  Bradycardia prohibiting beta-blocker usage  5.  ACE-I cough 6.  glaucoma  7. Atrial fib with tachy-brady syndrome  Review of Systems       As per HPI and past medical history; otherwise all systems negative.   Vital Signs:  Patient profile:   77 year old male Height:      71 inches Weight:      167 pounds BMI:     23.38 Pulse rate:   54 / minute BP sitting:   144 / 76  (left arm) Cuff size:   regular  Vitals Entered By: Hardin Negus, RMA (February 18, 2009 12:46 PM)  Physical Exam  General:  Slender. well appearing. no resp difficulty HEENT: normal Neck: supple. no JVD. Carotids 2+ bilat; bruits. No lymphadenopathy or thryomegaly appreciated. Cor: PMI nondisplaced. Regular and bradycardic. No rubs, murmur. +s4 Lungs: clear Abdomen: soft, nontender, nondistended.  Good bowel sounds. Extremities: no cyanosis, clubbing, rash, edema Neuro: alert & orientedx3, cranial nerves grossly intact. moves all 4 extremities w/o difficulty. affect pleasant    Impression & Recommendations:  Problem # 1:  CORONARY ARTERY DISEASE (ICD-414.00) Stable. No evidence of ischemia. Continue current regimen. Will  need screening ETT in near future.   Problem # 2:  HYPERTENSION (ICD-401.9) Blood pressure mildly elevated here but normal at home. Continue current regimen.  Problem # 3:  ATRIAL FIBRILLATION (ICD-427.31) Quiescent. Continue coumadin. No need for pacer at this point.  Other Orders: EKG w/ Interpretation (93000)  Patient Instructions: 1)  Labs at [facility] office, next week 2)  Follow up in 6 months

## 2010-02-10 NOTE — Medication Information (Signed)
Summary: rov/ewj  Anticoagulant Therapy  Managed by: Shelby Dubin, PharmD, BCPS, CPP Referring MD: Gala Romney MD, Reuel Boom PCP: Link Snuffer MD: Shirlee Latch MD, Dalton Indication 1: Atrial Fibrillation Lab Used: [facility] [place] Site: [address] INR POC 2.7 INR RANGE 2.0-3.0  Dietary changes: yes       Details: increased EtOH from "baseline"...  Health status changes: no    Bleeding/hemorrhagic complications: no    Recent/future hospitalizations: no    Any changes in medication regimen? no    Recent/future dental: no  Any missed doses?: no       Is patient compliant with meds? yes       Allergies (verified): No Known Drug Allergies  Anticoagulation Management History:      The patient is taking warfarin and comes in today for a routine follow up visit.  The patient is taking warfarin for chronic atrial fibrillation.  Positive risk factors for bleeding include an age of 17 years or older.  Negative risk factors for bleeding include no history of CVA/TIA, no history of GI bleeding, and absence of serious comorbidities.  The bleeding index is 'intermediate risk'.  Positive CHADS2 values include History of HTN and Age > 55 years old.  Negative CHADS2 values include History of CHF, History of Diabetes, and Prior Stroke/CVA/TIA.  The start date was 06/18/2008.  His last INR was 2.  Anticoagulation responsible provider: Shirlee Latch MD, Dalton.  INR POC: 2.7.  Cuvette Lot#: [id number].  Exp: 05/2010.    Anticoagulation Management Assessment/Plan:      The patient's current anticoagulation dose is Warfarin sodium 2.5 mg tabs: Use as directed by Anticoagualtion Clinic.  The target INR is 2.0-3.0.  The next INR is due 04/17/2009.  Anticoagulation instructions were given to patient.  Results were reviewed/authorized by Shelby Dubin, PharmD, BCPS, CPP.  He was notified by Shelby Dubin PharmD, BCPS, CPP.         Prior Anticoagulation Instructions: INR 3.4  Skip tonight's dosage  of coumadin, then resume same dosage 1 tablet daily except 2 tablets on Tuesdays and Saturdays.   Recheck in 3-4 weeks.      Current Anticoagulation Instructions: INR 2.7  Take 2 tabs each Tuesday, and 1 tab on all other days because INR is trending upwards.  Recheck in 3 - 4 weeks.

## 2010-02-10 NOTE — Medication Information (Signed)
Summary: rov/tm   Anticoagulant Therapy  Managed by: Eda Keys, PHarmD Referring MD: Gala Romney MD, Reuel Boom PCP: Debby [name] Supervising MD: Daleen Squibb MD, Maisie Fus Indication 1: Atrial Fibrillation Lab Used: [facility]  Site: [address] INR POC 2.6 INR RANGE 2.0-3.0  Dietary changes: no    Health status changes: no    Bleeding/hemorrhagic complications: no    Recent/future hospitalizations: no    Any changes in medication regimen? no    Recent/future dental: no  Any missed doses?: no       Is patient compliant with meds? yes       Allergies: No Known Drug Allergies  Anticoagulation Management History:      The patient is taking warfarin and comes in today for a routine follow up visit.  The patient is on warfarin for chronic atrial fibrillation.  Positive risk factors for bleeding include an age of 75 years or older.  Negative risk factors for bleeding include no history of CVA/TIA, no history of GI bleeding, and absence of serious comorbidities.  The bleeding index is 'intermediate risk'.  Positive CHADS2 values include History of HTN and Age > 34 years old.  Negative CHADS2 values include History of CHF, History of Diabetes, and Prior Stroke/CVA/TIA.  The start date was 06/18/2008.  His last INR was 2.  Anticoagulation responsible provider: Daleen Squibb MD, Maisie Fus.  INR POC: 2.6.  Cuvette Lot#: [id number].  Exp: 08.12.    Anticoagulation Management Assessment/Plan:      The patient's current anticoagulation dose is Warfarin sodium 2.5 mg tabs: Use as directed by Anticoagualtion Clinic.  The target INR is 2.0-3.0.  The next INR is due 07/03/2009.  Anticoagulation instructions were given to patient.  Results were reviewed/authorized by Eda Keys, PHarmD.  He was notified by Eda Keys, PharmD.         Prior Anticoagulation Instructions: INR 2.1 Continue 2.5mg s daily except 5mg s on Tuesdays. Recheck in 4 weeks.   Current Anticoagulation Instructions: INR  2.6  Continue taking 2 tablets on Tuesday and 1 tablet all other days.  Return ot clinic in 4 weeks.

## 2010-02-10 NOTE — Assessment & Plan Note (Signed)
Summary: bp check to compare home cuff/hms  Nurse Visit   Vital Signs:  Patient profile:   77 year old male Height:      71 inches Weight:      164.75 pounds Pulse rate:   62 / minute Pulse rhythm:   regular BP sitting:   142 / 74  (left arm) Cuff size:   regular Comments Pt here to compare his home BP monitor to ours.  His monitor shows a BP of 156/85 HR 61 and ours is 142/74 HR 62.  Pt concerned that his BP is still too high.  He has no complaints.  Stated his medications have not changed since his last office visit; he didn't bring a list and doesn't know what they are.  He is going to purchase a new monitor to use at home.  Pt aware Dr Gala Romney will be made aware of readings and his nurse will call him if any changes are to be made.  He states understanding.   Allergies: No Known Drug Allergies  Appended Document: bp check to compare home cuff/hms Has h/o hyperkalemia so will not use ace-i or ARB. Start amlodipine 2.5 once daily.   Appended Document: bp check to compare home cuff/hms Left message to call back   Appended Document: bp check to compare home cuff/hms pt aware, see phone note 3/23

## 2010-02-10 NOTE — Medication Information (Signed)
Summary: rov/sp  Anticoagulant Therapy  Managed by: Eda Keys, PharmD Referring MD: Gala Romney MD, Reuel Boom PCP: Debby [name] Supervising MD: Gala Romney MD, Reuel Boom Indication 1: Atrial Fibrillation Lab Used: [facility] [place] Site: [address] INR POC 2.2 INR RANGE 2.0-3.0  Dietary changes: no    Health status changes: no    Bleeding/hemorrhagic complications: no    Recent/future hospitalizations: no    Any changes in medication regimen? no    Recent/future dental: no  Any missed doses?: no       Is patient compliant with meds? yes       Allergies: No Known Drug Allergies  Anticoagulation Management History:      The patient is taking warfarin and comes in today for a routine follow up visit.  He is being anticoagulated because of chronic atrial fibrillation.  Positive risk factors for bleeding include an age of 75 years or older.  Negative risk factors for bleeding include no history of CVA/TIA, no history of GI bleeding, and absence of serious comorbidities.  The bleeding index is 'intermediate risk'.  Positive CHADS2 values include History of HTN and Age > 39 years old.  Negative CHADS2 values include History of CHF, History of Diabetes, and Prior Stroke/CVA/TIA.  The start date was 06/18/2008.  His last INR was 2.  Anticoagulation responsible provider: Bensimhon MD, Reuel Boom.  INR POC: 2.2.  Cuvette Lot#: [id number].  Exp: 11/2010.    Anticoagulation Management Assessment/Plan:      The patient's current anticoagulation dose is Warfarin sodium 2.5 mg tabs: Use as directed by Anticoagualtion Clinic.  The target INR is 2.0-3.0.  The next INR is due 10/30/2009.  Anticoagulation instructions were given to patient.  Results were reviewed/authorized by Eda Keys, PharmD.  He was notified by Eda Keys.         Prior Anticoagulation Instructions: INR 2.6  Continue same dose of 1 tablet every day except 2 tablets on Tuesday.  Recheck INR in 4 weeks.    Current Anticoagulation Instructions: INR 2.2  Continue taking 1 tablet everyday, except take 2 tablets on Tuesday.  Return in 4 weeks unless approved by Dr. Gala Romney.  Will call you to reschedule if able.

## 2010-02-10 NOTE — Medication Information (Signed)
Summary: rov/eac  Anticoagulant Therapy  Managed by: Cloyde Reams, RN, BSN Referring MD: Gala Romney MD, Reuel Boom PCP: Link Snuffer MD: Shirlee Latch MD, Merrit Friesen Indication 1: Atrial Fibrillation Lab Used: [facility] [place] Site: [address] INR POC 2.2 INR RANGE 2.0-3.0  Dietary changes: no    Health status changes: no    Bleeding/hemorrhagic complications: yes       Details: Incr bruising  Recent/future hospitalizations: no    Any changes in medication regimen? no    Recent/future dental: no  Any missed doses?: no       Is patient compliant with meds? yes       Allergies: No Known Drug Allergies  Anticoagulation Management History:      The patient is taking warfarin and comes in today for a routine follow up visit.  The patient is on warfarin for chronic atrial fibrillation.  Positive risk factors for bleeding include an age of 75 years or older.  Negative risk factors for bleeding include no history of CVA/TIA, no history of GI bleeding, and absence of serious comorbidities.  The bleeding index is 'intermediate risk'.  Positive CHADS2 values include History of HTN and Age > 45 years old.  Negative CHADS2 values include History of CHF, History of Diabetes, and Prior Stroke/CVA/TIA.  The start date was 06/18/2008.  His last INR was 2.  Anticoagulation responsible provider: Shirlee Latch MD, Luqman Perrelli.  INR POC: 2.2.  Cuvette Lot#: [id number].  Exp: 08/2010.    Anticoagulation Management Assessment/Plan:      The patient's current anticoagulation dose is Warfarin sodium 2.5 mg tabs: Use as directed by Anticoagualtion Clinic.  The target INR is 2.0-3.0.  The next INR is due 08/07/2009.  Anticoagulation instructions were given to patient.  Results were reviewed/authorized by Cloyde Reams, RN, BSN.  He was notified by Cloyde Reams RN.         Prior Anticoagulation Instructions: INR 2.6  Continue taking 2 tablets on Tuesday and 1 tablet all other days.  Return ot clinic in  4 weeks.    Current Anticoagulation Instructions: INR 2.2  Continue on same dosage 1 tablet daily except 2 tablets on Tuesdays. Return in 4 weeks.

## 2010-02-10 NOTE — Progress Notes (Signed)
Summary: afib   Phone Note Call from Patient Call back at Home Phone [phone]   Caller: Patient Reason for Call: Talk to Nurse Summary of Call: hr elevated, thinks he is in afib Initial call taken by: Migdalia Dk,  March 07, 2009 8:26 AM  Follow-up for Phone Call        Today BP 121/65 HR 69, 102/68 HR 69, 113/71 HR 57, no pain does feel shakey or jumpy, no SOB, pt states he feels like he went into a-fib for a short time but now is back in rhythm, wants to know at what point to take metoprolol, explained to pt, he will see how he feels today and c/b as needed Meredith Staggers, RN  March 07, 2009 9:09 AM

## 2010-02-10 NOTE — Progress Notes (Signed)
Summary: BP med  Medications Added PLAVIX 75 MG TABS (CLOPIDOGREL BISULFATE)  PLAVIX 75 MG TABS (CLOPIDOGREL BISULFATE) Take 1 tablet by mouth once a day PLAVIX 75 MG TABS (CLOPIDOGREL BISULFATE) Take 1 tablet by mouth once a day ADULT ASPIRIN LOW STRENGTH 81 MG  TBDP (ASPIRIN) 1 by mouth two times a day AMLODIPINE BESYLATE 10 MG TABS (AMLODIPINE BESYLATE) Take one tablet by mouth daily CHLORTHALIDONE 25 MG TABS (CHLORTHALIDONE) take one tablet once daily ADULT ASPIRIN LOW STRENGTH 81 MG  TBDP (ASPIRIN) Take 2 tablet by mouth once a day CALCIUM 600 1500 MG  TABS (CALCIUM CARBONATE) once daily ISOPTO CARPINE 1 %  SOLN (PILOCARPINE HCL) three times a day NORVASC 10 MG  TABS (AMLODIPINE BESYLATE) 1 by mouth once daily NORVASC 10 MG  TABS (AMLODIPINE BESYLATE) 1 by mouth once daily ZETIA 10 MG  TABS (EZETIMIBE) 1 by mouth once daily ZETIA 10 MG  TABS (EZETIMIBE) 1 by mouth once daily LIPITOR 80 MG TABS (ATORVASTATIN CALCIUM) Take one tablet by mouth daily. LIPITOR 40 MG  TABS (ATORVASTATIN CALCIUM) 1 by mouth once daily MULTIVITAMINS   TABS (MULTIPLE VITAMIN) 1 by mouth once daily NITROQUICK 0.4 MG  SUBL (NITROGLYCERIN) prn AUG BETAMETHASONE DIPROPIONATE 0.05 %  CREA (AUG BETAMETHASONE DIPROPIONATE) used as needed for ear infection. AUG BETAMETHASONE DIPROPIONATE 0.05 %  CREA (AUG BETAMETHASONE DIPROPIONATE) used as needed for ear infection. CRESTOR 40 MG  TABS (ROSUVASTATIN CALCIUM) Take 1 tablet by mouth once a day CRESTOR 40 MG  TABS (ROSUVASTATIN CALCIUM) Take 1 tablet by mouth once a day WARFARIN SODIUM 2.5 MG TABS (WARFARIN SODIUM) Use as directed by Anticoagualtion Clinic WARFARIN SODIUM 5 MG TABS (WARFARIN SODIUM) Use as directed by Anticoagulation Clinic METOPROLOL TARTRATE 25 MG TABS (METOPROLOL TARTRATE) take 1 as needed for tachycardia AMLODIPINE BESYLATE 2.5 MG TABS (AMLODIPINE BESYLATE) Take one tablet by mouth daily       Phone Note Call from Patient Call back at Home  Phone [phone]   Caller: Patient Reason for Call: Talk to Nurse Summary of Call: returning call  Initial call taken by: Migdalia Dk,  April 02, 2009 9:00 AM  Follow-up for Phone Call        I spoke with the pt and made him aware that Dr Gala Romney would like to start him on Amlodipine 2.5mg  daily.  The pt will monitor his BP 2-3 per week.  Rx sent to [facility].  Follow-up by: Julieta Gutting, RN, BSN,  April 02, 2009 9:19 AM    New/Updated Medications: AMLODIPINE BESYLATE 2.5 MG TABS (AMLODIPINE BESYLATE) Take one tablet by mouth daily Prescriptions: AMLODIPINE BESYLATE 2.5 MG TABS (AMLODIPINE BESYLATE) Take one tablet by mouth daily  #90 x 3   Entered by:   Julieta Gutting, RN, BSN   Authorized by:   Dolores Patty, MD, [facility]   Signed by:   Julieta Gutting, RN, BSN on 04/02/2009   Method used:   Electronically to        [id number] (retail)       [address]       [place], [place]  [address]       Ph: [phone] or [phone]       Fax: [phone]   RxID:   [id number]

## 2010-02-10 NOTE — Letter (Signed)
Summary: Handout Printed  Printed Handout:  - Coumadin Instructions-w/out Meds 

## 2010-02-10 NOTE — Assessment & Plan Note (Signed)
Summary: YEARLY FU/ MEDICARE/ MAY COME FATING/NWS   Vital Signs:  Patient profile:   77 year old male Height:      71 inches Weight:      164 pounds BMI:     22.96 O2 Sat:      96 % on Room air Temp:     98.0 degrees F oral Pulse rate:   60 / minute BP sitting:   114 / 62  (left arm) Cuff size:   regular  Vitals Entered By: Bill Salinas CMA (August 14, 2009 1:12 PM)  O2 Flow:  Room air CC: cpx/ab  Vision Screening:      Vision Comments: Pt has eye exam every 6 months. Hx of Glaucoma-both eyes. Next appt Aug 2011   Primary Care Provider:  Norins  CC:  cpx/ab.  History of Present Illness: Patient presents for medical follow-up. He is feeling great. He is exercisng on a regular basis. He just saw Dr. Gala Romney August 3 and was found to be doing well. He is to have an ETT for routine risk stratification and follow-up. He did have a lipid panel: LDL 98 up from 74 one year ago. On reflection he has had an increase in consumption of desserts.   Preventive Screening-Counseling & Management  Alcohol-Tobacco     Alcohol drinks/day: 4     Alcohol type: wine     Alcohol Counseling: not indicated; use of alcohol is not excessive or problematic     Smoking Status: quit     Smoking Cessation Counseling: no  Caffeine-Diet-Exercise     Diet Comments: heart healthy - low fat     Diet Counseling: to improve diet; diet is suboptimal     Does Patient Exercise: yes     Type of exercise: water exercise     Exercise (avg: min/session): 30-60     Times/week: 5     MSH Depression Score: not depressed  Hep-HIV-STD-Contraception     Dental Visit-last 6 months yes     Sun Exposure-Excessive: no  Safety-Violence-Falls     Seat Belt Use: yes     Fall Risk: no falls in past year  Current Medications (verified): 1)  Chlorthalidone 25 Mg Tabs (Chlorthalidone) .... Take One Tablet Once Daily 2)  Calcium 600 1500 Mg  Tabs (Calcium Carbonate) .... Once Daily 3)  Isopto Carpine 1 %  Soln  (Pilocarpine Hcl) .... Three Times A Day 4)  Lipitor 80 Mg Tabs (Atorvastatin Calcium) .... Take One Tablet By Mouth Daily. 5)  Multivitamins   Tabs (Multiple Vitamin) .[name].. 1 By Mouth Once Daily 6)  Nitroquick 0.4 Mg  Subl (Nitroglycerin) .... Prn 7)  Warfarin Sodium 2.5 Mg Tabs (Warfarin Sodium) .... Use As Directed By Anticoagualtion Clinic 8)  Metoprolol Tartrate 25 Mg Tabs (Metoprolol Tartrate) .... Take 1 As Needed For Tachycardia 9)  Amlodipine Besylate 2.5 Mg Tabs (Amlodipine Besylate) .... Take One Tablet By Mouth Daily 10)  Travatan Z 0.004 % Soln (Travoprost) .... Once Daily  Allergies (verified): No Known Drug Allergies  Past History:  Past Medical History: Last updated: 02/18/2009  1.  CAD      a.   s/p re-do bypass in 2001. Normal LV function      b.   LIMA to LAD, SVG to OM1 and 2, diagonal, and PDA.      c.   ETT/Myoview 9/08 EF 65% normal perfusion   2.  HTN  3.  Hyperlipidemia  4.  Bradycardia prohibiting beta-blocker  usage  5.  ACE-I cough 6.  glaucoma  7. Atrial fib with tachy-brady syndrome  Past Surgical History: Last updated: 12/09/2006 Tonsillectomy with adenoidectomy, remote * CARDIAC BYPASS SURGERY  Family History: Last updated: 08-09-2007 father - deceased. No health history avail mother - deceased @ 47: cancer death (breast,ovarian?), Neg- colon or prostate cancer; DM,CAD  Social History: Last updated: 08/09/2007 HS[employer], [employer]. 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  Risk Factors: Alcohol Use: 4 (08/14/2009) Diet: heart healthy - low fat (08/14/2009) Exercise: yes (08/14/2009)  Risk Factors: Smoking Status: quit (08/14/2009)  Social History: Dental Care w/in 6 mos.:  yes Sun Exposure-Excessive:  no Fall  Risk:  no falls in past year Risk analyst Use:  yes Alcohol:  1 per day Does Patient Exercise:  yes  Review of Systems  The patient denies anorexia, fever, weight loss, weight gain, decreased hearing, hoarseness, chest pain, dyspnea on exertion, peripheral edema, headaches, abdominal pain, hematochezia, hematuria, muscle weakness, difficulty walking, depression, abnormal bleeding, and angioedema.    Physical Exam  General:  Thin older white male in no distress Head:  normocephalic, atraumatic, and no abnormalities observed.   Eyes:  vision grossly intact, pupils equal, pupils round, and corneas and lenses clear.   Ears:  R ear normal and L ear normal.  Hearing normal Nose:  no external deformity and no external erythema.   Mouth:  good dentition, no gingival abnormalities, no dental plaque, and pharynx pink and moist.   Neck:  supple and full ROM.   Chest Wall:  no deformities.   Lungs:  normal respiratory effort, normal breath sounds, no crackles, and no wheezes.   Heart:  normal rate, regular rhythm, and no JVD.   Abdomen:  soft, non-tender, normal bowel sounds, no masses, and no hepatomegaly.   Prostate:  deferred to age. Msk:  normal ROM, no joint tenderness, no joint swelling, no joint warmth, and no joint instability.   Pulses:  2+ radial  Extremities:  No clubbing, cyanosis, edema, or deformity noted with normal full range of motion of all joints.   Neurologic:  alert & oriented X3, cranial nerves II-XII intact, strength normal in all extremities, gait normal, and DTRs symmetrical and normal.   Skin:  turgor normal, color normal, no rashes, no petechiae, and no ulcerations.   Cervical Nodes:  no anterior cervical adenopathy and no posterior cervical adenopathy.   Psych:  Oriented X3, memory intact for recent and remote, normally interactive, good eye contact, and not anxious appearing.     Impression & Recommendations:  Problem # 1:  ATRIAL FIBRILLATION (ICD-427.31) Stable  rate. Continues on coumadin  His updated medication list for this problem includes:    Warfarin Sodium 2.5 Mg Tabs (Warfarin sodium) ..... Use as directed by anticoagualtion clinic    Metoprolol Tartrate 25 Mg Tabs (Metoprolol tartrate) .[name]... Take 1 as needed for tachycardia    Amlodipine Besylate 2.5 Mg Tabs (Amlodipine besylate) .[name]... Take one tablet by mouth daily  Problem # 2:  CORONARY ARTERY DISEASE (ICD-414.00) Assessment: Deteriorated Stable with recent visit to Dr. Gala Romney. For ETT as noted  His updated medication list for this problem includes:    Chlorthalidone 25 Mg Tabs (Chlorthalidone) .[name]... Take one tablet  once daily    Nitroquick 0.4 Mg Subl (Nitroglycerin) .[name]... Prn    Metoprolol Tartrate 25 Mg Tabs (Metoprolol tartrate) .[name]... Take 1 as needed for tachycardia    Amlodipine Besylate 2.5 Mg Tabs (Amlodipine besylate) .[name]... Take one tablet by mouth daily  Problem # 3:  HYPERTENSION (ICD-401.9)  His updated medication list for this problem includes:    Chlorthalidone 25 Mg Tabs (Chlorthalidone) .[name]... Take one tablet once daily    Metoprolol Tartrate 25 Mg Tabs (Metoprolol tartrate) .[name]... Take 1 as needed for tachycardia    Amlodipine Besylate 2.5 Mg Tabs (Amlodipine besylate) .[name]... Take one tablet by mouth daily  BP today: 114/62 Prior BP: 118/64 (08/13/2009)  Labs Reviewed: K+: 3.9 (08/13/2009) Creat: : 1.0 (08/13/2009)     Good control. Continue present medications  Problem # 4:  HYPERLIPIDEMIA (ICD-272.4) Rise in LDL from 74 to 98. This is most likely due to dietary indescretion.  Plan - limit desserts.  His updated medication list for this problem includes:    Lipitor 40 Mg Tabs (Atorvastatin calcium) .[name]... 1 by mouth qpm  Problem # 5:  Preventive Health Care (ICD-V70.0) Unremarkable history and normal physical exanm. Labs reviewed and in normal limits but LDL is up. He is current with colorectal cancer screenig. He has no symptoms or signs to suggest  prostate disease and is not in need of screening at age 31.   Patient has great equanimity in the face of an unstable stock market. He has no signs of depression. He has no increased fall or injury risk. He does exercise on a regular basis. He is fully independent in all activities of daily living.  In summary - a delightful man who is medically stable. He will return as needed or 6 months.   Complete Medication List: 1)  Chlorthalidone 25 Mg Tabs (Chlorthalidone) .... Take one tablet once daily 2)  Calcium 600 1500 Mg Tabs (Calcium carbonate) .... Once daily 3)  Isopto Carpine 1 % Soln (Pilocarpine hcl) .... Three times a day 4)  Lipitor 40 Mg Tabs (Atorvastatin calcium) .[name].. 1 by mouth qpm 5)  Multivitamins Tabs (Multiple vitamin) .[name].. 1 by mouth once daily 6)  Nitroquick 0.4 Mg Subl (Nitroglycerin) .... Prn 7)  Warfarin Sodium 2.5 Mg Tabs (Warfarin sodium) .... Use as directed by anticoagualtion clinic 8)  Metoprolol Tartrate 25 Mg Tabs (Metoprolol tartrate) .... Take 1 as needed for tachycardia 9)  Amlodipine Besylate 2.5 Mg Tabs (Amlodipine besylate) .... Take one tablet by mouth daily 10)  Travatan Z 0.004 % Soln (Travoprost) .... Once daily  Other Orders: Subsequent annual wellness visit with prevention plan (D6644)  Patient: Larry Lopez Note: All result statuses are Final unless otherwise noted.  Tests: (1) BMP (METABOL)   Sodium                    140 mEq/L                   135-145   Potassium                 3.9 mEq/L                   3.5-5.1   Chloride                  102 mEq/L                   96-112   Carbon Dioxide  28 mEq/L                    19-32   Glucose                   90 mg/dL                    32-35   BUN                       13 mg/dL                    5-73   Creatinine                1.0 mg/dL                   2.2-0.2   Calcium                   8.9 mg/dL                   5.4-27.0   GFR                       77.11 mL/min                 >60  Tests: (2) Hepatic/Liver Function Panel (HEPATIC)   Total Bilirubin           1.1 mg/dL                   6.2-3.7   Direct Bilirubin          0.2 mg/dL                   6.2-8.3   Alkaline Phosphatase      84 U/L                      39-117   AST                       27 U/L                      0-37   ALT                       23 U/L                      0-53   Total Protein             6.4 g/dL                    1.5-1.7   Albumin                   4.1 g/dL                    6.1-6.0  Tests: (3) Lipid Panel (LIPID)   Cholesterol               173 mg/dL                   7-371     ATP III Classification            Desirable:  < 200 mg/dL  Borderline High:  200 - 239 mg/dL               High:  > = 240 mg/dL   Triglycerides             116.0 mg/dL                 6.3-875.6     Normal:  <150 mg/dL     Borderline High:  433 - 199 mg/dL   HDL                       29.51 mg/dL                 >88.41   VLDL Cholesterol          23.2 mg/dL                  6.6-06.3   LDL Cholesterol           98 mg/dL                    0-16  CHO/HDL Ratio:  CHD Risk                             3                    Men          Women     1/2 Average Risk     3.4          3.3     Average Risk          5.0          4.4     2X Average Risk          9.6          7.1     3X Average Risk          15.0          11.0   Preventive Care Screening  Last Flu Shot:    Date:  10/23/2008    Results:  given   Prevention & Chronic Care Immunizations   Influenza vaccine: given  (10/23/2008)    Tetanus booster: Not documented    Pneumococcal vaccine: Pneumovax  (05/13/2003)    H. zoster vaccine: Not documented   H. zoster vaccine deferral: Deferred  (08/14/2009)    Immunization comments: patient given Rx to get zostavax at pharmacy undr mediare part D  Colorectal Screening   Hemoccult: Not documented    Colonoscopy: Done  (08/29/2001)  Other Screening   PSA: Not  documented   Smoking status: quit  (08/14/2009)  Lipids   Total Cholesterol: 173  (08/13/2009)   Lipid panel action/deferral: Not indicated   LDL: 98  (08/13/2009)   LDL Direct: Not documented   HDL: 52.00  (08/13/2009)   Triglycerides: 116.0  (08/13/2009)    SGOT (AST): 27  (08/13/2009)   SGPT (ALT): 23  (08/13/2009)   Alkaline phosphatase: 84  (08/13/2009)   Total bilirubin: 1.1  (08/13/2009)    Lipid flowsheet reviewed?: Yes   Progress toward LDL goal: Unchanged  Hypertension   Last Blood Pressure: 114 / 62  (08/14/2009)   Serum creatinine: 1.0  (08/13/2009)   Serum potassium 3.9  (08/13/2009)  Self-Management Support :    Patient will work on the following items until the  next clinic visit to reach self-care goals:     Eating: eat fruit for snacks and desserts  (08/14/2009)    Hypertension self-management support: Not documented    Lipid self-management support: Not documented

## 2010-02-10 NOTE — Progress Notes (Signed)
Summary: Lipitor   Phone Note Call from Patient   Caller: Patient Summary of Call: pt called and stated he had reviewed his records and that in 2008 he had increased lipitor to 80mg  and he had a terrible time w/leg cramps and weakness, since he tolerates the 40mg  dose he will stay there and work on diet and exercise  Initial call taken by: Meredith Staggers, RN,  February 27, 2009 11:56 AM

## 2010-02-10 NOTE — Assessment & Plan Note (Signed)
Summary: f30m      Allergies Added: NKDA  Visit Type:  Follow-up Primary Provider:  Norins  CC:  no complaints.  History of Present Illness: Larry Lopez is a very pleasant 77 year old male with a history of a coronary artery disease status post redo bypass surgery in 2002. He also has a history of hypertension and hyperlipidemia, PAF complicated by  tachy-brady syndrome with significant resting bradycardia prohibiting AV-Nodal blockade. Was hospitalized in 6/10 with rapid AF.  He saw Dr. Graciela Husbands last year for consideration of pacemaker but it was thought that as AF is infrequent and self-limiting and his bradycardia is asymptomatic  that we would watch it for now and avoid AV-nodal blockers due to bradycardia. IF AF burden increased would then consider pacing and re-insitutuion of AV-nodal blockers.  He returns for routine f/u. He is doing great. [facility] at least 4 times a week and aerobics classes and treadmill. He feels great.  He denies any chest pain or shortness of breath no lower extremity edema. With exercise gets up to 112bpm. No palpitations. Tolerating coumadin well. no bleeding.    Current Medications (verified): 1)  Chlorthalidone 25 Mg Tabs (Chlorthalidone) .... Take One Tablet Once Daily 2)  Calcium 600 1500 Mg  Tabs (Calcium Carbonate) .... Once Daily 3)  Isopto Carpine 1 %  Soln (Pilocarpine Hcl) .... Three Times A Day 4)  Lipitor 80 Mg Tabs (Atorvastatin Calcium) .... Take One Tablet By Mouth Daily. 5)  Multivitamins   Tabs (Multiple Vitamin) .[name].. 1 By Mouth Once Daily 6)  Nitroquick 0.4 Mg  Subl (Nitroglycerin) .... Prn 7)  Warfarin Sodium 2.5 Mg Tabs (Warfarin Sodium) .... Use As Directed By Anticoagualtion Clinic 8)  Metoprolol Tartrate 25 Mg Tabs (Metoprolol Tartrate) .... Take 1 As Needed For Tachycardia 9)  Amlodipine Besylate 2.5 Mg Tabs (Amlodipine Besylate) .... Take One Tablet By Mouth Daily  Allergies (verified): No Known Drug  Allergies  Review of Systems       As per HPI and past medical history; otherwise all systems negative.   Vital Signs:  Patient profile:   77 year old male Height:      71 inches Weight:      162 pounds BMI:     22.68 Pulse rate:   52 / minute BP sitting:   118 / 64  (right arm) Cuff size:   regular  Vitals Entered By: Larry Lopez, RMA (August 13, 2009 9:49 AM)  Physical Exam  General:  Slender. well appearing. no resp difficulty HEENT: normal Neck: supple. no JVD. Carotids 2+ bilat; bruits. No lymphadenopathy or thryomegaly appreciated. Cor: PMI nondisplaced. Regular and bradycardic. No rubs, murmur. +s4 Lungs: clear Abdomen: soft, nontender, nondistended.  Good bowel sounds. Extremities: no cyanosis, clubbing, rash, edema Neuro: alert & orientedx3, cranial nerves grossly intact. moves all 4 extremities w/o difficulty. affect pleasant    Impression & Recommendations:  Problem # 1:  CORONARY ARTERY DISEASE (ICD-414.00) Stable. No evidence of ischemia. Continue current regimen. Plan screening ETT.  Problem # 2:  ATRIAL FIBRILLATION (ICD-427.31) Quiescent. Continue coumadin. No need for pacer at this point.  Problem # 3:  HYPERLIPIDEMIA (ICD-272.4) Followed by Dr. Debby Larry Lopez. Goal LDL < 70. Continue Lipitor.   Other Orders: EKG w/ Interpretation (93000) Treadmill (Treadmill)  Patient Instructions: 1)  Labs today 2)  Your physician has requested that you have an exercise tolerance test.  For further information please visit [email or web address]  Please also follow instruction sheet,  as given. 3)  Your physician wants you to follow-up in:  9 months.  You will receive a reminder letter in the mail two months in advance. If you don't receive a letter, please call our office to schedule the follow-up appointment.  Appended Document: f22m    Clinical Lists Changes  Medications: Added new medication of TRAVATAN Z 0.004 % SOLN (TRAVOPROST) once daily

## 2010-02-11 ENCOUNTER — Telehealth: Payer: Self-pay | Admitting: Internal Medicine

## 2010-02-12 NOTE — Medication Information (Signed)
Summary: rov/mw   Anticoagulant Therapy  Managed by: Weston Brass, PharmD Referring MD: Gala Romney MD, Reuel Boom PCP: Debby [name] Supervising MD: Patty Sermons Indication 1: Atrial Fibrillation Lab Used: [facility] [place] Site: [address] INR POC 2.3 INR RANGE 2.0-3.0  Dietary changes: no    Health status changes: no    Bleeding/hemorrhagic complications: no    Recent/future hospitalizations: no    Any changes in medication regimen? no    Recent/future dental: no  Any missed doses?: no       Is patient compliant with meds? yes       Allergies: No Known Drug Allergies  Anticoagulation Management History:      The patient is taking warfarin and comes in today for a routine follow up visit.  Warfarin therapy is being given due to chronic atrial fibrillation.  Positive risk factors for bleeding include an age of 75 years or older.  Negative risk factors for bleeding include no history of CVA/TIA, no history of GI bleeding, and absence of serious comorbidities.  The bleeding index is 'intermediate risk'.  Positive CHADS2 values include History of HTN and Age > 68 years old.  Negative CHADS2 values include History of CHF, History of Diabetes, and Prior Stroke/CVA/TIA.  The start date was 06/18/2008.  His last INR was 2.  Anticoagulation responsible provider: Myrtis Maille.  INR POC: 2.3.  Cuvette Lot#: [id number].  Exp: 02/2011.    Anticoagulation Management Assessment/Plan:      The patient's current anticoagulation dose is Warfarin sodium 2.5 mg tabs: Use as directed by Anticoagualtion Clinic.  The target INR is 2.0-3.0.  The next INR is due 02/04/2010.  Anticoagulation instructions were given to patient.  Results were reviewed/authorized by Weston Brass, PharmD.  He was notified by Weston Brass PharmD.         Prior Anticoagulation Instructions: INR 2.7  Continue taking 2 tablets on tuesday. And 1 tablet all other days. Recheck in 6 weeks.   Current Anticoagulation  Instructions: INR 2.3  Continue same dose of 1 tablet every day except 2 tablets on Tuesday.  Recheck INR in 6 weeks.

## 2010-02-12 NOTE — Medication Information (Signed)
Summary: rov/sp  Anticoagulant Therapy  Managed by: Bethena Midget, RN, BSN Referring MD: Gala Romney MD, Reuel Boom PCP: Debby [name] Supervising MD: Patty Sermons Indication 1: Atrial Fibrillation Lab Used: [facility] [place] Site: [address] INR POC 2.3 INR RANGE 2.0-3.0  Dietary changes: no    Health status changes: yes       Details: Sunday morning started experiencing Afib, seeing nurse today for EKG.   Bleeding/hemorrhagic complications: no    Recent/future hospitalizations: no    Any changes in medication regimen? no    Recent/future dental: no  Any missed doses?: no       Is patient compliant with meds? yes       Allergies: No Known Drug Allergies  Anticoagulation Management History:      The patient is taking warfarin and comes in today for a routine follow up visit.  Anticoagulation is being administered due to chronic atrial fibrillation.  Positive risk factors for bleeding include an age of 75 years or older.  Negative risk factors for bleeding include no history of CVA/TIA, no history of GI bleeding, and absence of serious comorbidities.  The bleeding index is 'intermediate risk'.  Positive CHADS2 values include History of HTN and Age > 69 years old.  Negative CHADS2 values include History of CHF, History of Diabetes, and Prior Stroke/CVA/TIA.  The start date was 06/18/2008.  His last INR was 2.  Anticoagulation responsible provider: Fay Swider.  INR POC: 2.3.  Cuvette Lot#: [id number].  Exp: 01/2011.    Anticoagulation Management Assessment/Plan:      The patient's current anticoagulation dose is Warfarin sodium 2.5 mg tabs: Use as directed by Anticoagualtion Clinic.  The target INR is 2.0-3.0.  The next INR is due 03/18/2010.  Anticoagulation instructions were given to patient.  Results were reviewed/authorized by Bethena Midget, RN, BSN.  He was notified by Bethena Midget, RN, BSN.         Prior Anticoagulation Instructions: INR 2.3  Continue same dose of 1 tablet  every day except 2 tablets on Tuesday.  Recheck INR in 6 weeks.   Current Anticoagulation Instructions: INR 2.3 Continue 1 pill everyday except 2 pills on Tuesdays. Recheck in 6 weeks.

## 2010-02-12 NOTE — Letter (Signed)
Summary: Ophthalmology/[facility]  Ophthalmology/[facility]   Imported By: Sherian Rein 01/22/2010 10:54:02  _____________________________________________________________________  External Attachment:    Type:   Image     Comment:   External Document

## 2010-02-12 NOTE — Progress Notes (Signed)
Summary: medication question   Phone Note Call from Patient Call back at [phone]   Caller: Patient Reason for Call: Talk to Nurse, Talk to Doctor Summary of Call: pt would like to discuss a old medication to see if Dr. Jesusita Oka still wants him to take it Initial call taken by: Omer Jack,  February 02, 2010 10:02 AM  Follow-up for Phone Call        Phone Call Completed PER DR Sherena Machorro METOPROLOL STILL GOOD CHOICE OF MED FOR AFIB HOWEVER IF FREQUENCY OF AFIB INCREASES TO CALL OFFICE  FOR POSSIBLE DIFFER MED   ALSO HAVE PT COME IN OFFICE THIS WEEK FOR EKG.PT AWARE OFABOVE AND HAS COUMADIN CLINIC APPT ON WED AT 9:00 AM  WILL DO EKG AT THAT TIME  PER PT HEART DOWN AND IN NORMAL RYHTM B/P OKAY FEELS FINE .  Follow-up by: Scherrie Bateman, LPN,  February 02, 2010 11:14 AM    Prescriptions: METOPROLOL TARTRATE 25 MG TABS (METOPROLOL TARTRATE) take 1 as needed for tachycardia  #30 x 11   Entered by:   Meredith Staggers, RN   Authorized by:   Dolores Patty, MD, [facility]   Signed by:   Meredith Staggers, RN on 02/02/2010   Method used:   Electronically to        [id number] (retail)       [address]       [place], [place]  [address]       Ph: [phone] or [phone]       Fax: [phone]   RxID:   [id number]

## 2010-02-12 NOTE — Assessment & Plan Note (Signed)
Summary: ekg epsiode over weekend of afib per dr Kaison Mcparland./cy  Nurse Visit   Vital Signs:  Patient profile:   77 year old male Height:      71 inches Weight:      165.50 pounds BMI:     23.17 Pulse rate:   54 / minute Pulse rhythm:   regular BP sitting:   122 / 76  (left arm) Cuff size:   regular  Vitals Entered By: Lisabeth Devoid RN (February 04, 2010 9:52 AM) CC: follow-up visit Comments Pt here for ekg.  He experienced rapid heart rate on 02/01/10 of 101.  He took 2 Metoprolol Tartrate 25mg  that day with HR decreasing to 71 by bedtime   Current Medications (verified): 1)  Chlorthalidone 25 Mg Tabs (Chlorthalidone) .... Take One Tablet Once Daily 2)  Calcium 600 1500 Mg  Tabs (Calcium Carbonate) .... Once Daily 3)  Isopto Carpine 1 %  Soln (Pilocarpine Hcl) .... Three Times A Day 4)  Lipitor 40 Mg Tabs (Atorvastatin Calcium) .[name].. 1 By Mouth Qpm 5)  Multivitamins   Tabs (Multiple Vitamin) .[name].. 1 By Mouth Once Daily 6)  Nitroquick 0.4 Mg  Subl (Nitroglycerin) .... Prn 7)  Warfarin Sodium 2.5 Mg Tabs (Warfarin Sodium) .... Use As Directed By Anticoagualtion Clinic 8)  Metoprolol Tartrate 25 Mg Tabs (Metoprolol Tartrate) .... Take 1 As Needed For Tachycardia 9)  Amlodipine Besylate 2.5 Mg Tabs (Amlodipine Besylate) .... Take One Tablet By Mouth Daily 10)  Travatan Z 0.004 % Soln (Travoprost) .... Once Daily  Allergies (verified): No Known Drug Allergies

## 2010-02-18 NOTE — Progress Notes (Signed)
Summary: question about medication due to rapid heart beat   Phone Note Call from Patient Call back at Home Phone [phone]   Caller: Patient Summary of Call: Pt have questions about taking a medication, due to his rapid heart beat. Initial call taken by: Judie Grieve,  February 11, 2010 8:46 AM  Follow-up for Phone Call        Pt. called to ask if he needs to take Metoprolol 25 mg. Pt. has metoprolol 25 mg as needed for tachycardia. Pt's heart rate is 85 beats/ min. Pt denies any symptoms. He states  is feeling great. I let pt. know that he can take Metoprolol 25 mg if his heart rate is over 100 beats/mi. Pt. verbalized understanding. Follow-up by: Ollen Gross, RN, BSN,  February 11, 2010 9:06 AM

## 2010-03-09 ENCOUNTER — Encounter: Payer: Self-pay | Admitting: Internal Medicine

## 2010-03-09 DIAGNOSIS — I4891 Unspecified atrial fibrillation: Secondary | ICD-10-CM

## 2010-03-18 ENCOUNTER — Encounter: Payer: Self-pay | Admitting: Cardiology

## 2010-03-18 ENCOUNTER — Encounter (INDEPENDENT_AMBULATORY_CARE_PROVIDER_SITE_OTHER): Payer: Medicare Other

## 2010-03-18 DIAGNOSIS — Z7901 Long term (current) use of anticoagulants: Secondary | ICD-10-CM

## 2010-03-18 DIAGNOSIS — I4891 Unspecified atrial fibrillation: Secondary | ICD-10-CM

## 2010-03-18 LAB — CONVERTED CEMR LAB: POC INR: 2.2

## 2010-03-24 NOTE — Medication Information (Signed)
Summary: rov/tm   Anticoagulant Therapy  Managed by: Weston Brass, PharmD Referring MD: Gala Romney MD, Reuel Boom PCP: Debby [name] Supervising MD: Myrtis Ser MD, Tinnie Gens Indication 1: Atrial Fibrillation Lab Used: [facility] [place] Site: [address] INR POC 2.2 INR RANGE 2.0-3.0  Dietary changes: no    Health status changes: yes       Details: have an episode of tachycardia about 1x/month and taking metoprolol prn  Bleeding/hemorrhagic complications: no    Recent/future hospitalizations: no    Any changes in medication regimen? no    Recent/future dental: no  Any missed doses?: no       Is patient compliant with meds? yes       Allergies: No Known Drug Allergies  Anticoagulation Management History:      The patient is taking warfarin and comes in today for a routine follow up visit.  Anticoagulation is being administered due to chronic atrial fibrillation.  Positive risk factors for bleeding include an age of 38 years or older.  Negative risk factors for bleeding include no history of CVA/TIA, no history of GI bleeding, and absence of serious comorbidities.  The bleeding index is 'intermediate risk'.  Positive CHADS2 values include History of HTN and Age > 56 years old.  Negative CHADS2 values include History of CHF, History of Diabetes, and Prior Stroke/CVA/TIA.  The start date was 06/18/2008.  His last INR was 2.  Anticoagulation responsible provider: Myrtis Ser MD, Tinnie Gens.  INR POC: 2.2.  Cuvette Lot#: [id number].  Exp: 01/2011.    Anticoagulation Management Assessment/Plan:      The patient's current anticoagulation dose is Warfarin sodium 2.5 mg tabs: Use as directed by Anticoagualtion Clinic.  The target INR is 2.0-3.0.  The next INR is due 05/06/2010.  Anticoagulation instructions were given to patient.  Results were reviewed/authorized by Weston Brass, PharmD.  He was notified by Weston Brass PharmD.         Prior Anticoagulation Instructions: INR 2.3 Continue 1 pill everyday  except 2 pills on Tuesdays. Recheck in 6 weeks.   Current Anticoagulation Instructions: INR 2.2  Continue same dose of 1 tablet every day except 2 tablets on Tuesday.  Recheck INR in 6 weeks.

## 2010-04-20 LAB — CARDIAC PANEL(CRET KIN+CKTOT+MB+TROPI)
CK, MB: 1.3 ng/mL (ref 0.3–4.0)
CK, MB: 1.8 ng/mL (ref 0.3–4.0)
Total CK: 118 U/L (ref 7–232)

## 2010-04-20 LAB — CBC
HCT: 50.9 % (ref 39.0–52.0)
Hemoglobin: 15.6 g/dL (ref 13.0–17.0)
Hemoglobin: 17.6 g/dL — ABNORMAL HIGH (ref 13.0–17.0)
Platelets: 195 10*3/uL (ref 150–400)
RBC: 5.43 MIL/uL (ref 4.22–5.81)
RDW: 14.1 % (ref 11.5–15.5)
WBC: 5.2 10*3/uL (ref 4.0–10.5)

## 2010-04-20 LAB — PROTIME-INR
INR: 1 (ref 0.00–1.49)
Prothrombin Time: 13.4 seconds (ref 11.6–15.2)

## 2010-04-20 LAB — DIFFERENTIAL
Lymphocytes Relative: 23 % (ref 12–46)
Lymphs Abs: 1.2 10*3/uL (ref 0.7–4.0)
Neutro Abs: 3.3 10*3/uL (ref 1.7–7.7)
Neutrophils Relative %: 63 % (ref 43–77)

## 2010-04-20 LAB — HEPARIN LEVEL (UNFRACTIONATED): Heparin Unfractionated: 0.65 IU/mL (ref 0.30–0.70)

## 2010-04-20 LAB — BASIC METABOLIC PANEL
Calcium: 8.7 mg/dL (ref 8.4–10.5)
Calcium: 9.4 mg/dL (ref 8.4–10.5)
GFR calc Af Amer: >60 mL/min (ref >60)
GFR calc Af Amer: >60 mL/min (ref >60)
GFR calc non Af Amer: >60 mL/min (ref >60)
GFR calc non Af Amer: >60 mL/min (ref >60)
Glucose, Bld: 101 mg/dL — ABNORMAL HIGH (ref 70–99)
Glucose, Bld: 99 mg/dL (ref 70–99)
Potassium: 3.8 mEq/L (ref 3.5–5.1)
Sodium: 139 mEq/L (ref 135–145)
Sodium: 140 mEq/L (ref 135–145)

## 2010-04-20 LAB — TROPONIN I: Troponin I: 0.01 ng/mL (ref 0.00–0.06)

## 2010-04-20 LAB — MAGNESIUM: Magnesium: 2.3 mg/dL (ref 1.5–2.5)

## 2010-04-20 LAB — TSH: TSH: 1.163 u[IU]/mL (ref 0.350–4.500)

## 2010-04-20 LAB — CK TOTAL AND CKMB (NOT AT ARMC)
CK, MB: 1.4 ng/mL (ref 0.3–4.0)
Total CK: 94 U/L (ref 7–232)

## 2010-05-06 ENCOUNTER — Ambulatory Visit (INDEPENDENT_AMBULATORY_CARE_PROVIDER_SITE_OTHER): Payer: Medicare Other | Admitting: *Deleted

## 2010-05-06 DIAGNOSIS — I4891 Unspecified atrial fibrillation: Secondary | ICD-10-CM

## 2010-05-06 LAB — POCT INR: INR: 2.1

## 2010-05-26 NOTE — Assessment & Plan Note (Signed)
[facility]                            CARDIOLOGY OFFICE NOTE   NAME:TURCOTKeigan, Tafoya                       MRN:          [id number]  DATE:09/21/2007                            DOB:          [birth year]    PRIMARY CARE PHYSICIAN:  Rosalyn Gess. Norins, MD.   INTERVAL HISTORY:  Mr. Layton is a delightful 77 year old male with a  history of coronary artery disease, status post redo bypass in 2002.  He  has normal LV function.  He also has a history of hypertension and  hyperlipidemia.  He has been intolerant of beta-blockers due to  bradycardia and unable to tolerate ACE inhibitors due to cough.   He is doing great.  [facility] walking 2 miles at a time.  He has no chest pain or shortness of  breath.  He walks fairly quickly.  However, he says that when he put his  hands on the bar to measures heart rate, he only gets about 55.  He is  wondering if he should do anything different.  His main complaint is  some lower extremity edema.  This is worse at night and usually goes  away in the morning.  He has not had orthopnea or PND.   CURRENT MEDICATIONS:  1. Aspirin 81 a day.  2. Calcium.  3. Plavix 75 a day.  4. Norvasc 10 a day.  5. Lipitor 40 a day.   He was intolerant previously of LIPITOR 80.   REVIEW OF SYSTEMS:  Negative except for as above.   PHYSICAL EXAMINATION:  GENERAL:  He is well-appearing, looks much  younger than the stated age.  He ambulates in the clinic briskly with no  respiratory distress.  VITAL SIGNS:  Blood pressure is 138/64, heart rate is 51, and weight is  163.  HEENT:  Normal.  NECK:  Supple.  No JVD.  Carotids are 2+ bilaterally without any bruits.  There is no lymphadenopathy or thyromegaly.  CARDIAC:  PMI is nondisplaced.  Regular rate and rhythm with an S4.  No  obvious murmur.  LUNGS:  Clear.  ABDOMEN:  Soft, nontender, and nondistended.  There is no  hepatosplenomegaly.   No bruits.  No masses.  Good bowel sounds.  EXTREMITIES:  Warm with no cyanosis, clubbing or edema.  Good pulses.  NEURO:  Alert and x3.  Cranial nerves II-XII intact.  Moves all 4  extremities without difficulty.  Affect is pleasant.   EKG shows sinus bradycardia at a rate of 51 with no ST-T wave  abnormalities.  QRS duration is 80 msec.   I walked him around the office and he walked very briskly.  He get his  heart rate up to about 80.   ASSESSMENT AND PLAN:  1. Coronary artery disease, status post redo bypass.  He is stable.      No evidence of ischemia.  2. Hypertension.  Blood pressures are mildly elevated.  We will be      taking him off his Norvasc.  I think this  is contributing to his      lower extremity edema.  We will start him on chlorthalidone at 12.5      mg a day.  Of note, I did consider spironolactone or an ARB,      however, he has a history of hyperkalemia.  Potassium is around 5,      so we chose a thiazide.  3. Lower extremity edema.  I suspect this is mostly venous      insufficiency in combination with his Norvasc.  As above we are      stopping Norvasc.  We are changing him to chlorthalidone and giving      him support stockings as needed.  4. Bradycardia.  He may have some ailment of chronotropic      incompetence, but this is not debilitating.  Continue current      therapy.  5. Hyperlipidemia.  Recheck his lipids.  Goal LDL is less than 70.  He      has been intolerant of higher doses of STATINS in the past.   DISPOSITION:  Return to clinic in 6 months.     Bevelyn Buckles. Bensimhon, MD  Electronically Signed    DRB/MedQ  DD: 09/21/2007  DT: 09/22/2007  Job #: [id number]

## 2010-05-26 NOTE — Assessment & Plan Note (Signed)
[facility]                           PRIMARY CARE OFFICE NOTE   NAME:TURCOTRegan, Llorente                       MRN:          [id number]  DATE:07/12/2006                            DOB:          14-Sep-1926    Mr. Gullickson is a 77 year old gentleman followed for CAD, history of MI,  history of bypass surgery, history of hyperlipidemia and hypertension.   The patient reports he has had a 3-week history of mild dysequilibrium  that is specifically related to head movement.  If he rotates and  extends his neck, such as looking back over his shoulder or up at an  object, he will have dysequilibrium that is short in duration.  He also  will have a similar problem if he bends over.  The patient also reports  that, if he moves quickly from a sitting or lying to a standing  position, he will have momentary vertigo/dysequilibrium.  The patient  has had no slurred speech, change in vision, focal weakness,  paresthesias, loss of coordination or other neurological symptoms or  changes.   CURRENT MEDICATIONS:  1. Lipitor 40 mg daily.  2. Aspirin 81 mg daily.  3. Calcium daily.  4. Plavix 75 mg daily.  5. Isopto-Carpine drops t.i.d.  6. Furosemide 20 mg daily.  7. Norvasc 5 mg daily.  8. Zetia 10 mg daily.   REVIEW OF SYSTEMS:  Negative for any fevers, sweats, or chills.  He has  had no chest pain, chest discomfort, or other cardiac symptoms.  He has  had no respiratory problems.   CHART REVIEW:  The patient has had no neuro imaging.   EXAMINATION:  Temperature 98.5, blood pressure 115/67, pulse 60, weight  168.  GENERAL APPEARANCE:  This is a well-nourished gentleman in no acute  distress.  NEUROLOGIC:  The patient is awake, alert, and oriented to person, place,  time, and context.  His speech is clear.  He is a good historian.  There  is no cognitive impairment.  Cranial nerves 2-12 are intact.  Normal  facial symmetry and muscle movement.  No deviation of  the tongue or  uvula.  Extraocular muscles are intact.  There was no nystagmus.  Pupils  are equal, round, and reactive.  The patient had normal cerebellar  function with normal rapid alternating finger movement and no pronator  drift.  He could do a heel to shin maneuver without difficulty.  He had  normal gait and station.   ASSESSMENT AND PLAN:  1. Labyrinthitis.  I believe the patient's dysequilibrium associated      with the movement of his head, particularly with rotation and      extension, is strongly suggestive of labyrinthitis.  Full      explanation was provided to the patient, including diagrams and      anatomy pictures.  Plan:  If the patient's symptoms are bothersome,      I have recommended taking Meclizine in the form of Bonine at 12.5      mg q.8h as needed.  This is optional.  2. Positional vertigo.  The patient does have some component of      positional vertigo, most likely related to carotid dysautonomia.      Full explanation was provided to the patient.  I have advised him      to follow the rule of 20 in terms of giving himself adequate time      for accommodation and adjustment if he changes positions.   The patient was reassured that his neurological examination was nonfocal  and that there was no evidence of a central nervous system disorder  here.  I have advised him to watch for signs of any progressive change,  particularly slurred speech, change in speech, focal weakness, change in  vision, change in motor strength.     Rosalyn Gess Norins, MD  Electronically Signed    MEN/MedQ  DD: 07/13/2006  DT: 07/13/2006  Job #: [id number]   cc:   Jossie Ng Groot

## 2010-05-26 NOTE — H&P (Signed)
NAMEDIMETRI, ARMITAGE NO.:  [id number]   MEDICAL RECORD NO.:  [id number]          PATIENT TYPE:  INP   LOCATION:  2926                         FACILITY:  [facility]   PHYSICIAN:  Jesse Sans. Wall, MD, FACCDATE OF BIRTH:  03-03-1926   DATE OF ADMISSION:  06/16/2008  DATE OF DISCHARGE:                              HISTORY & PHYSICAL   PRIMARY CARDIOLOGIST:  Bevelyn Buckles. Bensimhon, MD.   PRIMARY CARE PHYSICIAN:  Rosalyn Gess. Norins, MD.   HISTORY OF PRESENT ILLNESS:  A 77 year old Caucasian male with known  history of CAD, CABG x2, hypertension, hyperlipidemia, and multiple  percutaneous interventions who during this morning after climbing stairs  in his home began to have racing heart.  He states he had to climb some  stairs into the attic and came back down and then had forgotten  something and had to go back upstairs and on his way back down he  started feeling funny and felt his heart racing.  He rested for a few  minutes and then took his blood pressure and found that his blood  pressure was 117/76 with his heart rate in the 120s.  As a result of  this and the feeling funny, he called our office and was directed to me.  As this is a weekend, I advised him to come to the emergency room to  evaluate his heart rate.  On arrival, he had an EKG which revealed an  AFib RVR with a heart rate in the 120s and 140s with some nonspecific ST  changes, possibly rate-related.  The ER physician called and was advised  to start him on a Cardizem drip.  He was given 15 mg IV bolus and  started on 15 mg an hour.  The patient denies any chest pain, shortness  of breath, nausea, vomiting, or weakness associated with these symptoms  and is currently resting quietly.   REVIEW OF SYSTEMS:  Rapid heart rate.  All other systems reviewed and  are found to be negative.   PAST MEDICAL HISTORY:  1. Extensive cardiac history, CAD.      a.     Status post 3-vessel coronary artery bypass grafting  in       [place], [place].  This was an SVG to the LAD; SVG to OM-1, 2,       and 3; and SVG to RCA.      b.     Status post MI in 1995 with PCI to the SVG to OM-1.      c.     Recurrent angina with SVG to the right coronary artery       stenting in 1995.      d.     Status post coronary artery bypass grafting redo in August       2001.  2. Hypertension.  3. Hyperlipidemia.   PAST SURGICAL HISTORY:  CABG x2 in 1983 and 2001.  Echocardiogram  revealing EF of 55-60% in June 2009.   SOCIAL HISTORY:  He lives in [place] with his wife.  He is retired.  No  cigarettes, no alcohol, no drug use.   FAMILY HISTORY:  Nonsignificant secondary to patient's age and cardiac  history.   CURRENT MEDICATIONS AT HOME:  1. Plavix 75 mg daily.  2. Lipitor 40 mg daily.  3. Norvasc 10 mg daily.  4. Aspirin 81 mg daily.  5. Multivitamin daily.   ALLERGIES:  No known drug allergies.   CURRENT LABORATORY DATA:  Hemoglobin 17.6, hematocrit 50.9, white blood  cells 5.2, platelets 182, INR 1.0, PT 13.4, APTT 27.  Troponin and  chemistries are pending.  Chest x-ray revealing no active disease.  EKG  revealing atrial fib with RVR with ST changes, nonspecific, possibly  rate related.   PHYSICAL EXAMINATION:  VITAL SIGNS:  Blood pressure 100/71, pulse 89  irregular, respirations 20, O2 sat 100% on 2 L.  GENERAL:  He is awake, alert, oriented, in no acute distress.  Good  affect.  HEENT:  Head is normocephalic and atraumatic.  Eyes, PERRLA.  Mucous  membranes, mouth pink and moist.  Tongue is midline. NECK:  Supple, no  JVD.  No carotid bruits are appreciated.  CARDIOVASCULAR:  Irregular rhythm.  Distant heart sounds without  murmurs, rubs, or gallops.  Pulses are 2+ and equal without bruits.  LUNGS:  Clear to auscultation without wheezes, rales, or rhonchi.  ABDOMEN:  Soft, nontender, 2+ bowel sounds.  EXTREMITIES:  Without clubbing or cyanosis.  There is some mild  nonpitting edema in the lower  extremities bilaterally.  NEURO:  Cranial nerves II-XII are grossly intact with exception he is  hard of hearing.   IMPRESSION:  1. Paroxysmal atrial fibrillation with rapid ventricular response, new      onset.  2. Coronary artery disease.      a.     Status post coronary artery bypass grafting in 1983 with       redo in October 2001 with multiple percutaneous coronary       interventions.  3. History of bradycardia intolerant to beta-blocker.  4. History of hypertension.  5. Hyperlipidemia.   PLAN:  This is a very pleasant 77 year old Caucasian male, well known to  Dr. Arvilla Meres, with sudden onset of atrial fibrillation with RVR  this morning after climbing stairs.  The patient was advised to come to  the emergency room and found to be in AFib RVR, heart rate 120-140.  The  patient was started on a diltiazem drip.  After a 15 mg bolus, the  patient will be admitted, we will cycle cardiac enzymes, begin heparin,  and follow closely  making further recommendations throughout hospital course.  In the  interim, the patient's Norvasc will be discontinued as he is on the  Cardizem, and adjustment of medications may be necessary post discharge.  This has been explained to the patient who verbalizes understanding and  is willing to be admitted.      Bettey Mare. Lyman Bishop, NP      Jesse Sans. Daleen Squibb, MD, [facility]  Electronically Signed    KML/MEDQ  D:  06/16/2008  T:  06/16/2008  Job:  [id number]   cc:   Rosalyn Gess. Norins, MD

## 2010-05-26 NOTE — Assessment & Plan Note (Signed)
Larry Lopez                            Larry Lopez   NAME:Larry Lopez, Larry Lopez                       MRN:          [id number]  DATE:04/06/2007                            DOB:          1926/05/09    PRIMARY CARE PHYSICIAN:  Dr. Illene Regulus   INTERVAL HISTORY:  [name] is a very pleasant 77 year old male with  history of coronary artery disease status post redo bypass surgery six  or seven years ago.  He has normal LV function.  Also with a history of  hyperlipidemia and hypertension.  He has been intolerant to BETA  BLOCKERS in the past due to bradycardia.   He returns today for routine follow-up.  He is doing quite well.  [facility] and walks two miles in under  40 minutes.  He has not had any chest pain or shortness of breath.  He  does have numerous questions about his cholesterol regimen.  He does not  like to take Zetia as he thinks it is associated with worsening  outcomes.  He also has been intolerant of high dose Lipitor in the past.   CURRENT MEDICATIONS:  1. Aspirin 81 a day.  2. Calcium.  3. Plavix 75 a day.  4. Lipitor 40 a day.  5. Multivitamin.  6. Norvasc 10 a day.   PHYSICAL EXAMINATION:  GENERAL:  He is well-appearing and in no acute  distress, ambulates around the clinic without respiratory difficulty.  VITAL SIGNS:  Blood pressure was 128/64, heart rate 61, weight 163.  HEENT:  Normal.  NECK:  Supple.  No JVD.  Carotids are 2+ bilaterally without bruits.  There is no lymphadenopathy or thyromegaly.  CARDIAC:  PMI is  nondisplaced, regular rate and rhythm with S4, no murmur.  LUNGS:  Clear.  ABDOMEN:  Soft, nontender, nondistended.  There is no  hepatosplenomegaly, no bruits, no masses.  Good bowel sounds.  EXTREMITIES:  Warm with no cyanosis, clubbing or edema.  SKIN:  No rash.  NEUROLOGICAL:  Alert and oriented x3.  Cranial nerves II-XII are intact.  Moves all four extremities  without  difficulty.  Affect is pleasant.   EKG shows normal sinus rhythm at a rate of 61 with possible small  inferior Q waves.  No significant ST-T wave abnormalities.   ASSESSMENT/PLAN:  1. Coronary artery disease is stable without any evidence of ongoing      ischemia.  Continue current therapy.  Continue exercise.  2. Hypertension well-controlled.  3 Chronic hyperlipidemia.  Goal LDL is less than 70.  We will get  fasting lipid panel tomorrow if his LDL is not under 70.  We have made  the decision to switch him over to Crestor 40.  However, if his LDL is  markedly elevated I think he would probably be better served on Lipitor  40 and Zetia 10.  We had a very long discussion about this.  We will see  what his lipids are on the check tomorrow.  We will also check a liver  panel.  DISPOSITION:  Will see him back in six months for routine follow-up.  Total time spent during the visit of approximately 35 minutes.     Bevelyn Buckles. Bensimhon, MD  Electronically Signed    DRB/MedQ  DD: 04/06/2007  DT: 04/07/2007  Job #: [id number]

## 2010-05-26 NOTE — Assessment & Plan Note (Signed)
[facility]                            CARDIOLOGY OFFICE NOTE   NAME:TURCOTGaberial, Lopez                       MRN:          [id number]  DATE:09/15/2006                            DOB:          1926-04-15    PRIMARY CARE PHYSICIAN:  Rosalyn Gess. Norins, M.D.   INTERVAL HISTORY:  Mr. Larry Lopez is a delightful 77 year old male with a  history of coronary artery disease, status post redo bypass  approximately 6 years ago.  He has normal LV function.  He also has a  history of hyperlipidemia, hypertension.  He has been intolerant of beta-  blocker in the past due to significant bradycardia.  He was previously  followed by Dr. Glennon Hamilton and comes today to establish long term  followup.   Overall, he is doing quite well.  [facility] where they do stretching, calisthenics, and  some aerobics for about an hour.  He tolerates this without much  problems.  He has had 2 episodes in the past month or so, one while  taking the garbage and one during the exercise class where he had some  brief period of dyspnea but this was fleeting.  There was no chest pain.  He has been compliant with all his medications.  No heart failure  symptoms.   CURRENT MEDICATIONS:  1. Aspirin 81.  2. Plavix 75.  3. Norvasc 5.  4. Zetia 10.  5. Lipitor 40.  6. Multivitamin.   Of note, he was intolerant of 80 mg of Lipitor.   PHYSICAL EXAMINATION:  GENERAL:  He is well-appearing, no acute  distress.  He ambulates around the clinic without any respiratory  difficulty.  VITAL SIGNS:  Blood pressure is 142/68, heart rate is 53, weight is 168.  HEENT:  Normal.  NECK:  Supple.  No JVD.  Carotids are 2+ bilaterally without any bruits.  There is no lymphadenopathy or thyromegaly.  CARDIAC:  PMI is not displaced.  He has a regular rate and rhythm with  an S4.  No obvious murmur.  LUNGS:  Clear.  ABDOMEN:  Soft, nontender, nondistended.   There is hepatosplenomegaly.  No bruits.  No masses.  Good bowel sounds.  EXTREMITIES:  Warm with no cyanosis, clubbing, or edema.  Good pulses.  No rash.  NEURO:  Alert and oriented x3.  Cranial nerves II-XII are intact.  He  moves all 4 extremities without difficulty.  Affect is pleasant.   EKG shows a sinus bradycardia at a rate of 53 with mild nonspecific T  wave flattening in V1 and V2.   ASSESSMENT/PLAN:  1. Coronary artery disease is quite stable.  He has had several      episodes of dyspnea.  I think it is reasonable to proceed with a      treadmill Cardiolite to make sure there is no underlying ischemia.      It would also be good to see what his heart rate response is.  2. Hypertension.  Blood pressure is suboptimally controlled.  Increase  his Norvasc to 10 a day.  I suspect he may need an ACE inhibitor in      the future.  3. Hyperlipidemia.  Most recent LDL was 105.  He subsequently has been      started on Zetia.  We will recheck his Lipids today.  If his LDL is      at goal, then I would leave him where he is at.  If he is not, I      would consider switching the Lipitor over to Crestor and he is      interested in trying to stop the Zetia but I told him not at this      moment.   DISPOSITION:  We will see him back for routine followup in 6 months.  We  will await the results of his stress test.     Bevelyn Buckles. Bensimhon, MD  Electronically Signed    DRB/MedQ  DD: 09/15/2006  DT: 09/15/2006  Job #: [id number]

## 2010-05-26 NOTE — Discharge Summary (Signed)
NAMEBOYD, BUFFALO NO.:  [id number]   MEDICAL RECORD NO.:  [id number]          PATIENT TYPE:  INP   LOCATION:  2926                         FACILITY:  [facility]   PHYSICIAN:  Bevelyn Buckles. Bensimhon, MDDATE OF BIRTH:  May 19, 1926   DATE OF ADMISSION:  06/16/2008  DATE OF DISCHARGE:  06/17/2008                               DISCHARGE SUMMARY   PROCEDURES PERFORMED DURING HOSPITALIZATION:  None.   FINAL DISCHARGE DIAGNOSES:  1. Paroxysmal atrial fibrillation with rapid ventricular response.      a.     Converted with Cardizem drip.      b.     CHADS score of 2.  2. Hypotension.  3. Coronary artery disease.      a.     Status post 3-vessel coronary artery bypass grafting in       [place], [place] with saphenous vein graft to left anterior       descending, saphenous vein graft to obtuse marginal 1, 2, and 3,       and saphenous vein graft to right coronary artery.      b.     Status post myocardial infarction in 1995 with percutaneous       coronary intervention to saphenous vein graft and obtuse marginal       1.      c.     Recurrent angina with saphenous vein graft to right coronary       artery stenting in 1995.      d.     Status post redo coronary artery bypass grafting in August       2001.  4. Hypertension.  5. Hyperlipidemia.   HOSPITAL COURSE:  This is a very pleasant 77 year old Caucasian male  with known history of CAD, CABG, and hypertension who presented to the  emergency room after calling over the weekend because of racing heart.  In the emergency room, he had an EKG revealing AFib with RVR,  ventricular rate of 140 beats per minute.  The patient was given IV  bolus of Cardizem and placed on a Cardizem drip and brought to the unit  for further evaluation.  Cardiac enzymes were cycled and found to be  negative x3.  The patient converted to normal sinus rhythm approximately  6 hours later and diltiazem drip was discontinued.  The patient remained  mildly hypotensive with a blood pressure of 104/51.  He was previously  on Norvasc prior to being admitted, but this was not restarted.  He was  also found to be mildly hypokalemic with a potassium of 3.1 which was  repleted.  The patient was found after cardioversion to be bradycardic  at rest with evidence of chronotropic incompetence as he had been walked  in Dr. Prescott Gum office with a heart rate in 80s.  The patient was  started on p.o. Coumadin 5 mg daily and heparin was discontinued.  He  will follow up in our office by Dr. Sherryl Manges to evaluate for  chronotropic incompetence and possible need for pacemaker with evidence  of tachy-brady syndrome.  The patient's  Plavix will be discontinued and  so will his Norvasc.  The patient will be followed in the Coumadin  Clinic and also by Dr. Gala Romney after discharge.  It was discussed with  the patient by Dr. Gala Romney that there was a possibility of need for  pacemaker.   DISCHARGE LABORATORY DATA:  Sodium 139, potassium 3.1 which has been  repleted, chloride 104, CO2 of 26, BUN 18, creatinine 1.0, and glucose  101.  Hemoglobin 15.6, hematocrit 45.1, white blood cells 6.8, and  platelets 195.  Troponins are negative x3.   Blood pressure 104/51, pulse 48, respirations 15, temperature 98.6, and  O2 saturation 95% on room air.   DISCHARGE MEDICATIONS:  1. Lipitor 40 mg daily.  2. Aspirin 81 mg daily.  3. Multivitamin daily.  4. Calcium daily.  5. Coumadin 5 mg daily (new prescription provided).  The patient has been advised to stop taking Plavix.   FOLLOWUP PLANS AND APPOINTMENTS:  1. The patient will follow up with the Coumadin Clinic on June 8 at 11      a.m. for establishment with clinic and adjustment of Coumadin      dosing in the setting of paroxysmal atrial fibrillation.  2. The patient will follow with Dr. Sherryl Manges on June 22, 2008, at      1:45 p.m. for evaluation for need for pacemaker in the setting of       tachy-brady syndrome and possible chronotropic incompetence.  3. The patient will follow up with Dr. Arvilla Meres on September 04, 2008, at 10:30 p.m. on previously scheduled appointment.  4. The patient will follow up with primary care physician, Dr. Illene Regulus.  He is to make that appointment on his own accord for      continued medical management.  5. The patient has been advised on changing of medications, to stop      Plavix, and to start Coumadin.   Time spent with the patient to include physician time 35 minutes.      Bettey Mare. Lyman Bishop, NP      Bevelyn Buckles. Bensimhon, MD  Electronically Signed    KML/MEDQ  D:  06/17/2008  T:  06/18/2008  Job:  [id number]   cc:   Rosalyn Gess. Norins, MD

## 2010-05-29 NOTE — Discharge Summary (Signed)
[place]. [facility]  Patient:    Larry Lopez, Larry Lopez                       MRN: [id number] Adm. Date:  [id number] Disc. Date: [id number] Attending:  Cleatrice Burke Dictator:   Carlye Grippe. CC:         Cecil Cranker, M.D. [facility]   Discharge Summary  CLINICAL HISTORY:  This is a 77 year old gentleman who underwent coronary artery bypass grafting x 5 in 1983 in [place], [place], by Dr. ______.  He has done well since that procedure, but required multiple percutaneous interventions for vein graft stenosis and native vessel disease.  He was recently admitted with recurrent exertional angina.  Cardiac catheterization showed severe native three-vessel disease with severe saphenous vein graft disease.  The LAD was occluded proximally.  There was a patent saphenous vein graft to the mid LAD.  There was a medium sized diagonal branch that came off the LAD proximally and the a high-grade proximal stenosis.  The left circumflex was a dominant vessel that gave off two or three obtuse marginal branches.  The obtuse marginal branches were occluded.  They were filled by a saphenous vein graft that was severely and diffusely diseased.  The vein graft had been stented a couple of times and had high-grade restenosis inside the stents.  There were two obtuse marginal branches at the vein grafts.  Both were small branches.  The distal left circumflex terminated at a small posterolateral branch which was not felt to be graftable size.  The right coronary artery was diffusely diseased.  The posterior descending branch was occluded and had an open saphenous vein graft to the posterior descending branch, which was diffusely disease.  Left ventricular function was well preserved.  After review of the angiograms and examination of the patient, it was felt by Alleen Borne, M.D., that coronary artery bypass grafting was the best treatment.  This was discussed with the  patient.  The patient was admitted for the procedure.  PAST MEDICAL HISTORY:  Coronary artery disease.  History of myocardial infarction.  History of hyperlipidemia.  MEDICATIONS ON ADMISSION: 1. Aspirin 325 mg q.d. 2. Lipitor 20 mg q.h.s. 3. Hyzaar 50/12.5 mg q.d. 4. Toprol XL 50 mg one half q.d. 5. Isoptocarpine 1% eyedrops. 6. Nitroglycerin 0.4 mg p.r.n. 7. Ativan 1 mg t.i.d. p.r.n.  ALLERGIES:  None.  FAMILY HISTORY:  Please see the recent history and physical done at the previous hospitalization.  SOCIAL HISTORY:  Please see the recent history and physical done at the previous hospitalization.  REVIEW OF SYSTEMS:  Please see the recent history and physical done at the previous hospitalization.  PHYSICAL EXAMINATION:  Please see the recent history and physical done at the previous hospitalization.  HOSPITAL COURSE:  The patient was admitted electively and underwent the following procedure:  Redo coronary artery bypass grafting x 5.  The following grafts were placed:  1. Left internal mammary artery to the LAD. 2. Sequential saphenous vein graft to obtuse marginal 1 and obtuse marginal 2. 3. Saphenous vein graft to the diagonal.  4. Saphenous vein graft to the posterior descending.  The cross clamp time was 138 minutes.  The patient tolerated the procedure well and was taken to the surgical intensive care unit in stable condition.  The patient has done well postoperatively.  He has remained hemodynamically stable.  He initially had some sinus bradycardia, but on subsequently changed  to normal sinus rhythm.  He has had no significant cardiac dysrhythmias.  He was extubated without difficulty.  Routine lines, monitors, and tubes were discontinued in a stepwise routine manner.  The patient did have postoperative anemia that did require transfusion.  The hemoglobin and hematocrit have stabilized with the most recent of 8.9 and 25.2, respectively, on November 20, 1999.  The  incisions are healing well.  The patient was tolerating routine advancement in regards to cardiac rehabilitation phase I modalities.  The patient is tolerating diet.  Oxygen has been weaned and he maintained good saturation.  He is tolerating a good diuresis, although he is above his preoperative weight and will continue short term on post discharge Lasix.  DISPOSITION:  Overall he is currently felt to be quite stable for tentative discharge in the morning of November 21, 1999, pending morning round re-evaluation.  MEDICATIONS ON DISCHARGE: 1. Enteric-coated aspirin 325 mg q.d. 2. Lipitor 20 mg q.d. 3. Tylox one to two q.4-6h. p.r.n. 4. Lasix 40 mg q.d. x 5 days. 5. Potassium chloride 20 mEq q.d. x 5 days.  Note that the patient has not been started on his Hyzaar and Toprol has been on hold.  FOLLOW-UP:  Will include Alleen Borne, M.D., in three weeks and E. Graceann Congress, M.D., in two weeks.  FINAL DIAGNOSES: 1. Recurrent coronary artery disease. 2. History of myocardial infarction. 3. Hyperlipidemia. 4. Postoperative anemia. 5. History of previous percutaneous transluminal coronary angioplasty and    stenting.  DISCHARGE INSTRUCTIONS:  The patient will receive written instructions in regard to medications, activity, diet, wound care, and follow-up. DD:  11/20/99 TD:  11/21/99 Job: [id number]

## 2010-05-29 NOTE — Op Note (Signed)
[place]. [facility]  Patient:    Larry Lopez, Larry Lopez                       MRN: [id number] Proc. Date: 11/16/99 Adm. Date:  [id number] Attending:  Cleatrice Burke CC:         [facility]  [facility]  [facility]   Operative Report  PREOPERATIVE DIAGNOSIS:  Severe native three-vessel coronary artery disease and saphenous vein graft disease with unstable angina.  POSTOPERATIVE DIAGNOSIS:  Severe native three-vessel coronary artery disease and saphenous vein graft disease with unstable angina.  PROCEDURE:  Median sternotomy, extracorporeal circulation, redo coronary artery bypass graft surgery x 5 using a left internal mammary artery graft to the left anterior descending coronary artery, with a saphenous vein graft to the diagonal branch of the left anterior descending artery and a sequential saphenous vein graft to the first and second obtuse marginal branches of the left circumflex coronary artery, and a saphenous vein graft to the posterior descending branch of the right coronary artery.  SURGEON:  Alleen Borne, M.D.  ASSISTANT:  _____, P.A.-C.  ANESTHESIA:  General endotracheal.  CLINICAL HISTORY:  This patient is a 77 year old gentleman who underwent coronary bypass x 5 in 1983 in [place], [place].  He has done well since that procedure but has required multiple percutaneous interventions for vein graft stenoses and native vessel disease.  He was recently readmitted with recurrent exertional angina.  Cardiac catheterization showed severe native three-vessel disease with severe saphenous vein graft disease.  The LAD was occluded proximally.  There was a patent saphenous vein graft to the mid-LAD. There was a medium-sized diagonal branch that came off the LAD proximally, then a high-grade proximal stenosis.  The left circumflex was a dominant vessel that gave off two or three obtuse marginal branches.  The  obtuse marginal branches were occluded.  They were filled by a saphenous vein graft that was severely and diffusely diseased.  This vein graft had been stented a couple of times and had high-grade restenoses inside the stents.  The two obtuse marginal branches that the vein graft went to were both small branches. The distal left circumflex terminated as a small posterolateral branch, which was not felt to be a graftable size.  The right coronary artery was diffusely diseased.  The posterior descending branch was occluded and had an open saphenous vein graft to the posterior descending branch, which was diffusely diseased.  Left ventricular function was well-preserved.  After review of the angiograms and examination of the patient, it was felt that coronary artery bypass graft surgery was the best treatment.  I discussed the operative procedure of redo coronary bypass surgery with the patient and his wife, including the increased risks and the possibility that the obtuse marginal branches may not be graftable due to their small size.  I also discussed the usual risks of bleeding, possible blood transfusion, infection, stroke, myocardial infarction, and death.  They understood and agreed to proceed with surgery.  DESCRIPTION OF PROCEDURE:  The patient was taken to the operating room and placed on the table in supine position.  After induction of general endotracheal anesthesia, a Foley catheter was placed in the bladder using sterile technique.  Then the chest, abdomen, and both lower extremities were prepped and draped in the usual sterile manner.  The chest was re-entered through the previous median sternotomy incision.  The sternum was opened without difficulty using  the oscillating saw.  The pericardium had not been closed at the original procedure.  Therefore, the anterior surface of the right ventricle was adhesed to the sternum, and this was dissected using electrocautery.  Then  the left internal mammary artery was harvested from the chest wall as a pedicle graft.  This was a medium-caliber vessel with excellent blood flow through it.  At the same time, a segment of greater saphenous vein was harvested from the right lower leg, and this vein was of medium size and good quality.  Then further dissection was performed to expose the right atrium and ascending aorta.  The old saphenous vein grafts were carefully identified and preserved.  Then the patient was heparinized and when an adequate activated clotting time was achieved, the distal ascending aorta was cannulated using a 20 French aortic cannula for arterial inflow.  Venous outflow was achieved using a two-stage venous cannula through the right atrial appendage.  An antegrade cardioplegia and vent cannula was inserted in the aortic root.  The patient was placed on cardiopulmonary bypass, and the remainder of the heart was dissected from the pericardium.  There were moderately dense adhesions present throughout.  The distal coronary arteries were then identified.  The LAD was a medium-sized graftable vessel.  Saphenous vein graft to the LAD was diseased proximally but beyond this had no significant disease in it.  The diagonal branch was intramyocardial, was located, and was a medium-sized graftable vessel.  The first and second marginal branches were intramyocardial.  They were located and were small but graftable.  The distal left circumflex was a very small vessel that was not graftable.  On the right coronary artery, there was a medium-sized posterior descending branch that was graftable.  Then the aorta was crossclamped and 500 cc of cold blood antegrade cardioplegia was administered in the aortic root with quick arrest of the heart.  Systemic hypothermia at 20 degrees Centigrade and topical hypothermic iced saline was used.  The temperature probe was placed in the septum and an  insulating pad in the  pericardium.  The first distal anastomosis was performed to the first marginal branch.  The internal diameter was about 1.5 mm.  The conduit used was a segment of greater saphenous vein and the anastomosis performed in a sequential side-to-side manner using continuous 7-0 Prolene suture.  Flow was noted through the graft and was excellent.  The second distal anastomosis was performed to the second marginal branch. The internal diameter was 1.5 mm.  The conduit used was the same segment of greater saphenous vein.  Anastomosis was performed in a sequential end-to-side manner using continuous 7-0 Prolene suture.  Flow was noted through the graft and was excellent.  Then another dose of cardioplegia was given down the vein graft and in the aortic root.  The third distal anastomosis was performed to the diagonal branch.  The internal diameter was 1.6 mm.  The conduit used was a second segment of greater saphenous vein and the anastomosis performed in an end-to-side manner using continuous 7-0 Prolene suture.  Flow was noticed through the graft and was excellent.  The fourth distal anastomosis was performed to the posterior descending branch.  The internal diameter was 1.5 mm.  The conduit used was a third segment of third segment of greater saphenous vein and the anastomosis performed in an end-to-side manner using continuous 7-0 Prolene suture.  Flow was noted through the graft and was excellent.  Then another dose of cardioplegia was  given down the vein graft and in the aortic root.  The fifth distal anastomosis was performed in the distal portion of the left anterior descending coronary artery.  The internal diameter was 1.75 mm.  The conduit used was the left internal mammary artery graft, and this was brought through an opening in the left pericardium anterior to the phrenic nerve.  It was anastomosed to the LAD in an end-to-side manner using continuous 8-0 Prolene suture.  The  pedicle was tacked to the epicardium with 6-0 Prolene sutures.  The patient was rewarmed to 37 degrees Centigrade.  Then with the aortic crossclamp in place, the three proximal vein graft anastomoses were performed to the hoods of the old vein grafts using continuous 6-0 Prolene suture in an end-to-side manner.  Then the aortic root was de-aired.  The clamp was removed from the mammary pedicle.  There was rapid warming of the ventricular septum and return of spontaneous ventricular fibrillation.  The crossclamp was removed with a time of 138 minutes.  There was spontaneous return of sinus rhythm.  The proximal and distal anastomoses appeared hemostatic and the alignment of the grafts satisfactory.  Graft markers were placed on the proximal anastomoses.  Two temporary right ventricular and right atrial pacing wires were placed and brought out through the skin.  When the patient had rewarmed to 37 degrees Centigrade, he was weaned from cardiopulmonary bypass on no inotropic agents.  Total bypass time was 188 minutes.  Cardiac function appeared excellent with a cardiac output of 4 L/min.  Protamine was given, and the venous and aortic cannulae were removed without difficulty.  Four chest tubes were placed, with a tube in the posterior pericardium, one in the anterior mediastinum, and one in both pleural spaces.  The pericardium was loosely reapproximated over the heart. The sternum was closed with #6 stainless steel wires.  The fascia was closed with continuous #1 Vicryl suture.  The subcutaneous tissue was closed using continuous 2-0 Vicryl, and the skin with 3-0 Vicryl subcuticular closure.  The lower extremity vein harvest site was closed in layers in a similar manner. The sponge, needle, and instrument counts were correct according to the scrub nurse.  Dry sterile dressings were applied over the incisions, around the chest tubes, which was hooked to Pleuravac suction.  The patient  remained stable and was transported to the SICU in guarded but stable condition. DD:  11/16/99 TD:  11/17/99 Job: [id number]

## 2010-05-29 NOTE — Cardiovascular Report (Signed)
[place]. [facility]  Patient:    Larry Lopez, Larry Lopez                       MRN: [id number] Proc. Date: 06/23/99 Adm. Date:  [id number] Disc. Date: [id number] Attending:  Alric Quan CC:         Cecil Cranker, M.D. [facility]             Catheter lab                        Cardiac Catheterization  PROCEDURES: 1. Left heart catheterization with coronary angiography. 2. Bypass graft angiography and left ventriculography. 3. Percutaneous transluminal coronary angioplasty x 3 of a sequential    saphenous vein graft to obtuse marginal-1, obtuse marginal-2, and obtuse    marginal-3.  INDICATION:  Mr. Frankey Poot is a 77 year old male with a history of previous bypass surgery.  He has had multiple previous interventions including placement of three stents and sequential vein graft to OM1, OM2, and OM3 and a stent in the distal portion of the vein to the distal right coronary artery.  He presented with recurrent unstable angina.  DESCRIPTION OF PROCEDURE:  A 6 French sheath was placed in the right femoral artery.  Catheter was utilized including a 6 [place], 6 [place] JL4, and 6 [place] RCD.  We used an angled pigtail catheter for ventriculography. Contrast was Omnipaque.  There were no complications.  RESULTS:  Hemodynamics: 1. Left ventricular pressure 138/20. 2. Aortic pressure 128/64. 3. There is no aortic valve gradient.  Left ventriculogram:  There is mild focal area of hypokinesis of the anterior wall.  Ejection fraction is calculated at 62%.  No mitral regurgitation.  Coronary arteriography (codominant): 1. Left main normal. 2. LAD is 100% occluded at its origin. 3. The mid and distal LAD fill via saphenous vein graft. 4. Left circumflex has a 50% stenosis in the proximal vessel.  The distal    vessel has a 60% followed by an 80% stenosis proximal to the origin of    two small posterolateral branches.  There is a small first posterolateral  branch with an 80% stenosis proximally.  There is a small second    posterolateral branch.  There are three normal size obtuse marginals which    are all 100% occluded at their origin and fill via a sequential vein graft. 5. Right coronary artery has a 70% stenosis proximally, 100% occlusion in the    mid-vessel.  The distal vessel including the posterior descending artery    and a small posterior lateral branch fill via a saphenous vein graft. 6. Saphenous vein graft to LAD is patent.  There is a 30% stenosis in the    proximal graft.  It fills the mid and distal LAD.  The proximal vein graft    insertion in the mid-vessel is a 50% stenosis.  In the apical vessel, there    was a 70% stenosis. 7. The first diagonal was normal size with a tubular 90% stenosis. 8. Sequential saphenous vein graft to OM1/OM2/OM3 had significant disease.    There is a 60% restenosis in the proximal stent.  In the mid-stent there is    a 99% focal end-stent restenosis at the proximal edge of the stent.  There    is TIMI II flow beyond this.  In the distal portion of the vein graft just    after the second  anastomosis is a 90% stenosis.  There are collaterals    filling the obtuse marginal branches from the LAD. 9. Saphenous vein graft to the distal right coronary artery has three 30%    stenoses in the proximal and mid-vessel.  In the distal vessel, there is    a stent which is widely patent with 0% stenosis.  IMPRESSION: 1. Preserved left ventricular systolic function. 2. Severe native three vessel coronary artery disease. 3. Status post coronary artery bypass grafting characterized by patent grafts    to the right coronary artery and left anterior descending artery.  There    is significant restenosis in the sequential vein graft to obtuse    marginal-1, obtuse marginal-2, and obtuse marginal-3 as described.  PLAN:  Percutaneous intervention to the vein graft to the obtuse marginal. See below.  PTCA  PROCEDURE NOTE:  Following completion of diagnostic catheterization, we opted to proceed with coronary intervention.  A 6 French sheath in the right femoral artery was exchanged over the wire for a 7 [place] sheath.  We used a 7 [place] guiding catheter with side holes and a luge wire.  The wire was advanced successfully via the vein graft into the distal marginal.  We then used a 2.5 by 15 mm Maverick balloon.  This was positioned within the stent, across the 99% stenosis in the stent in the mid-graft, and inflated to 12 atmospheres.  The balloon was then passed distally and positioned across the 90% stenosis in the distal anastomosis and inflated to 9 atmospheres.  We then used a 3.5 by 15 mm cutting balloon.  This was positioned across the stents in the mid-vessel and inflated to 6 atmospheres.  The balloon was then pulled back slightly to overlap the proximal edge of the stent as well as the vessel proximal to the stent where it was inflated to 6 atmospheres for two additional inflations.  The balloon was then positioned within the stent and the proximal graft and inflated to 6 atmospheres for three inflations.  We then went back to the portion of the vein graft just proximal to the mid-stent and inflated the same 3, 5, and 15 cutting balloon to 6 atmospheres for two additional inflations.  Final angiographic images revealed an excellent result at all sites.  The 99% stenosis in the mid-graft at the proximal edge of the stent in the mid-graft was reduced to less than 20% with TIMI III flow.  The 90% stenosis at the distal anastomosis was reduced to 25% with TIMI III flow.  The 60% stenosis in the proximal stent was reduced to 25% stenosis with TIMI III flow.  COMPLICATIONS:  None.  RESULTS:  Successful PTCA at three sites with the saphenous vein graft to OM1/OM2/OM3 as described above.  A 99% end-stent restenosis and the mid-graft was reduced to less than 20%.  A 90% stenosis in  the distal anastomosis was reduced to 25%.  A 60% stenosis in the proximal graft was reduced to 25%.   PLAN:  Integrilin will be continued overnight.  The patient will be continued on Plavix which he has been taking since his last coronary intervention.  If he does have recurrent end-stent restenosis, would consider use of brachytherapy. DD:  06/23/99 TD:  06/25/99 Job: [id number]

## 2010-05-29 NOTE — Cardiovascular Report (Signed)
[place]. [facility]  Patient:    Larry Lopez, Larry Lopez                       MRN: [id number] Proc. Date: 03/17/99 Adm. Date:  [id number] Disc. Date: [id number] Attending:  Pricilla Riffle CC:         Mahala Menghini. Evonnie Dawes, M.D. [facility]             E. Graceann Congress, M.D. [facility]                        Cardiac Catheterization  INDICATIONS:  This is a 77 year old patient with an 66 year old bypass graft. e has been having unstable angina.  This study is done to rule out ischemia.  The case was done from the right femoral artery.  There is considerable scar tissue in the right femoral artery.  A right coronary catheter and J wire was used to negotiate the mild to moderate peripheral vascular disease in the leg.  CORONARY ARTERIOGRAPHY:  Left main coronary artery:  The left main coronary artery had 50% diffuse disease.  Left anterior descending artery:  The left anterior descending artery was 100% occluded ostially.  Circumflex coronary artery:  The circumflex coronary artery had 70% diffuse disease proximally.  The obtuse marginal branches were all 100% occluded and the distal  circumflex had 90% discrete disease.  Right coronary artery:  The right coronary artery was 100% occluded in its mid vessel.  Saphenous vein graft to the right coronary artery had 30% multiple discrete lesions in the mid vessel.  There was a 60% hazy-type lesion in the distal body of the ein graft to the RCA.  Saphenous vein graft to the obtuse marginal branch was markedly diseased. There was a 995-99% tubular lesion proximally.  There was also 80% diffuse in-stent re-stenosis in the midbody and poor runoff.  Saphenous vein graft to the LAD had 20% discrete disease.  There was diffuse 40-50% disease in the distal LAD after insertion of the graft.  Subselective injection of the left subclavian showed the LIMA to be widely patent.   RIGHT ANTERIOR OBLIQUE VENTRICULOGRAPHY:  The RAO  ventriculography revealed anterior basal wall hypokinesis with an ejection fraction in the 50%.  There was no MR and no gradient across the aortic valve.  Aortic pressure was 137/66.  LV pressure was 133 with a post A wave EDP of 30.   IMPRESSION:  I do not think the patient would be a candidate for re-do coronary  artery bypass graft since the vein graft to the left anterior descending looks o good.  The problem with the vein graft to the obtuse marginal branch is extremely poor runoff.  However, I do think that it would be reasonable to start him on the IIb/IIIa inhibitor and proceed with possible angioplasty of this vein graft in he morning.  We may also need to intervene on the distal right coronary graft.  The films will be reviewed with Dr. Juanda Chance and Dr. Riley Kill for final recommendations. DD:  03/17/99 TD:  03/18/99 Job: [id number]

## 2010-05-29 NOTE — Assessment & Plan Note (Signed)
[facility]                           PRIMARY CARE OFFICE NOTE   NAME:TURCOTUrian, Martenson                       MRN:          [id number]  DATE:05/12/2006                            DOB:          [birth year]    Mr. Beaver is a 77 year old Caucasian gentleman followed for CAD,  hyperlipidemia and hypertension who presents for followup evaluation and  exam. He was last seen May 04, 2005 in primary care and in the  interval has kept up with his cardiology visits, most recently April 07, 2006. Dr. Corinda Gubler is pleased with the patient's progress. He has  recommended the patient to Dr. Gala Romney following his retirement.   CHIEF COMPLAINT:  1. Lipids. The patient is very concerned about Zetia. He has read the      press reports. He had stopped taking Zetia and is on Lipitor alone.      His LDL had gone from 73 to 88. Dr. Corinda Gubler is aware of this and      has ordered a followup lipid panel at the end of May. We discussed      extensively the role of Zetia in terms of lowering LDL and that it      does do this in an effective way. Furthermore Zetia has not been      shown to have any adverse effect. We also discussed risk and risk      management in regards to his overall cholesterol management.  2. GI. The patient complains of singultus on an intermittent basis,      usually in the evening and usually following a meal. He is      concerned this is a variant of GERD and we discussed the mechanisms      involved.  3. DERM. The patient has several new lumps and bumps including  a      small nodule on his right forehead and a new lesion on his back. He      is followed by Dr. Dorinda Hill on a regular basis.  4. GU. The patient does have some problems with decreased libido and      decreased tumescence. He has had his testosterone level checked May 25, 2004 which was low at 259. We discussed the relative options      including testosterone replacement.  There is not an absolute      cardiovascular contraindication although if he should decide to      pursue this, he would need cardiac clearance.   PAST MEDICAL HISTORY:   SURGICAL:  1. Tonsillectomy with adenoidectomy, remote.  2. Cardiac bypass surgery x5 vessels in 1983.  3. Redo surgery 2001 with LIMA to LAD and SVG to OM1 and 2, diagonal      and PDA.   MEDICAL:  1. Usual childhood disease.  2. An unusual illness that restricted his activities for several      months as a child.  3. CAD with initial surgery by Dr. __________ in [place].  4. MI in 1995 with angioplasty to follow with dilation  of the SVG to      OM1, OM2 and OM3.  5. Repeat angioplasty in 1995.  6. Angioplasty and stenting of the marginal vessel in 2001.  7. Hyperlipidemia.  8. Glaucoma.  9. Hearing loss.   PHYSICIAN ROSTER:  1. Dr. Gabriel Rung [place] for cardiology.  2. Dr. Narda Bonds for ENT.  3. Dr. Nile Riggs for ophthalmology.  4. Dr. Laneta Simmers for CVTS.   FAMILY HISTORY:  Negative for colon cancer, prostate cancer or diabetes.   SOCIAL HISTORY:  The patient was married for 19 years. He has been  married to his second wife for 35 years. He has 4 sons and 1 daughter  and now has 6 grandchildren. 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 He  is an oenophile. He is happily married and reports that his marriage is  very satisfying and stable. He also reports that he thoroughly enjoys  his grandchildren and is a great grandfather.   CURRENT MEDICATIONS:  1. Lipitor 40 mg daily.  2. Aspirin 81 mg b.i.d.  3. Calcium daily.  4. Plavix 75 mg daily.  5. Multivitamin daily.  6. Isopto-Carpine t.i.d.  7. Lasix 20 mg daily.  8. Norvasc 5 mg daily.   REVIEW OF SYSTEMS:  Negative for any constitutional, cardiovascular,  respiratory, or GI problem. The patient does have occasional cough and  throat clearing particularly when in animated  conversation. No GU  problems except as per the HPI.   CHART REVIEW:  Last chest x-ray from April 09, 2004 was negative for any  active disease. Last EKG from April 07, 2006 showed sinus bradycardia.  The last colonoscopy was August 2003 with Dr. Corinda Gubler recommending 4  year followup. Last stress test Cardiolite study from September 30, 2004  which showed no ischemia, ejection fraction of 62%. Last lower extremity  arterial Doppler from March 2000 was negative for arterial disease. Last  abdominal ultrasound December 2002 was negative for abdominal aortic  aneurysm. Last carotid Doppler study from October 2001 with no evidence  of any carotid stenosis.   PHYSICAL EXAMINATION:  VITAL SIGNS:  Temperature was 97.2, blood  pressure 139/69, pulse 56, weight 172.  GENERAL:  A well-nourished, well-developed gentleman in no acute  distress.  HEENT:  Normocephalic, atraumatic. The patient had hearing aids in both  ears. Oropharynx with native dentition in good repair. No buccal or  palatal lesions were noted. The posterior pharynx was clear, conjunctiva  and sclera was clear. PERRLA. EOMI. Funduscopic exam was unremarkable.  NECK:  Supple without thyromegaly.  NODES:  No adenopathy was noted in the cervical or supraclavicular  regions.  CHEST:  No CVA tenderness. He has a well healed sternotomy scar.  LUNGS:  Clear with no rales, wheezes or rhonchi.  CARDIOVASCULAR:  2+ radial pulses. No JVD or carotid bruits. He had a  quiet precordium with a regular rate and rhythm without murmurs, rubs or  gallops.  ABDOMEN:  Soft, no guarding, no rebound, no organosplenomegaly was  noted.  GENITALIA:  Normal male phallus. The patient has mildly atrophic  testicles that are soft.  RECTAL:  A normal sphincter tone was noted. The prostate was smooth,  round and normal in size and contour. No nodules or abnormalities were  present. EXTREMITIES:  Without clubbing, cyanosis, edema or deformity.  NEUROLOGIC:   Nonfocal.   Most recent laboratory April 12, 2006 with normal electrolytes. Glucose  was 101. Kidney function normal  with a creatinine of 1.1. liver  functions were normal. Cholesterol 163, triglycerides 116, HDL 52.0, LDL  was 88.   ASSESSMENT/PLAN:  1. Coronary artery disease stable and followed by Dr. Corinda Gubler.  2. Lipids. An area of discussion in regards to the use of Zetia. The      patient is to have a followup lipid panel at the end of May. Based      on those results, it may be necessary to restart him on Zetia or      other second agent to attain an LDL goal of less than 80.  3. Hypertension. The patient's blood pressure is adequately controlled      on his present medical regimen. He will continue the same.  4. Hypogonadism. Patient with known low testosterone level and mild      gonadal change. We discussed the options. At this point, he will      take this under consideration. Should he decide he wants to      consider testosterone replacement would then need clearance from      cardiology.  5. Health maintenance. The patient should schedule a colonoscopy at      his convenience or ask me to assist in the same. He is otherwise      current and up-to-date with health maintenance. The patient did      have a pneumonia vaccine in 2005. He may want to consider Zostavax      and we would be glad to help facilitate this in regards to whether      there is insurance coverage or whether he needs to be referred to      the adult immunization clinic.   In summary, this is a very pleasant gentleman with problems as outlined  above who is medically stable. I have asked him to return to see me in 1  year or on an as needed basis.     Rosalyn Gess Norins, MD  Electronically Signed    MEN/MedQ  DD: 05/13/2006  DT: 05/13/2006  Job #: [id number]   cc:   Jossie Ng. [place]

## 2010-05-29 NOTE — Assessment & Plan Note (Signed)
[facility]                              CARDIOLOGY OFFICE NOTE   NAME:TURCOTCataldo, Cosgriff                       MRN:          [id number]  DATE:09/16/2005                            DOB:          14-Jun-1926    Mr. Mangiaracina is a very pleasant 77 year old married white male, 24 years post  CABG by Dr. Fayette Pho, and five years post redo CABG by Dr. Laneta Simmers.  Patient  continues to feel well, is completely asymptomatic.  He has a normal  ventricle.  He exercises regularly.  His cholesterol is well controlled.  He  has not tolerated beta blockers, which were discontinued in February, 2001  because of sinus bradycardia.   The patient has had occasional blood pressures in the 140-150 range.  For  several weeks, they have been in the 120s range; however, with anticipation  of his doctor's visit, according to the patient, his blood pressure has been  up to the 150s over the last 3-4 days.   MEDICATIONS:  Include Zetia 10, Lipitor 40, aspirin 81, Plavix 75,  __________, furosemide 20, per Dr. Debby [name].   PHYSICAL EXAMINATION:  VITAL SIGNS:  Blood pressure 148/77, pulse 58, sinus  bradycardia.  GENERAL APPEARANCE:  Normal.  NECK:  JVP is not elevated.  Carotid pulses are palpable.  He has no bruits.  LUNGS:  Clear.  CARDIAC:  Normal.  ABDOMEN:  Normal.  No pulsations or masses.  Liver, spleen, and kidney not  palpable.  EXTREMITIES:  Normal.  Pulses are palpable equally bilaterally.   EKG:  No sinus bradycardia.  Minor nonspecific T changes in AVL and V1-2.  These may be slightly more significant than a year ago, but I do not think  there is significant concern.  The patient is asymptomatic.   IMPRESSION:  1. Coronary disease, five years status post redo coronary artery bypass      graft with good left ventricular function and asymptomatic.  2. Hyperlipidemia, controlled.  3. Hypertension, fair control.  I have suggested adding additional      antihypertensive  therapy; however, the patient is somewhat resistant      and would like to continue on the present regimen.  I suggested that he      continue with daily blood pressures, fax this information after a      month.  Certainly, if he has more than 1 or 2 days when the blood      pressure is over 130, I think additional therapy would be indicated.   Otherwise, we will plan to see him in six months.  He should have a BNP,  lipid, and LFTs in the near future.                              Cecil Cranker, MD, [facility]    EJL/MedQ  DD:  09/16/2005  DT:  09/16/2005  Job #:  [id number]

## 2010-05-29 NOTE — Discharge Summary (Signed)
[place]. [facility]  Patient:    Larry Lopez, Larry Lopez                       MRN: [id number] Adm. Date:  [id number] Disc. Date: 06/23/99 Attending:  Alric Quan Dictator:   Joellyn Rued, P.A.C. CC:         Cecil Cranker, M.D. [facility]             Hubert L. Evonnie Dawes, M.D. [facility]                  Referring Physician Discharge [place]  DATE OF BIRTH:  11-10-1926  SUMMARY OF HISTORY:  Larry Lopez is a 77 year old white male who has been doing well since his last intervention in March.  However, he began cardiac rehab approximately three weeks ago.  While in rehab this morning, he developed chest tightness while using the Stepmaster.  He took a sublingual nitroglycerin without relief.  He then began exercising on an Exercycle, and after approximately two minutes, he had recurring chest discomfort associated with shortness of breath and diaphoresis.  The sublingual nitroglycerin did not provide any relief.  He stopped exercising and he was brought to the emergency room.  His initial EKG did not show any acute changes.  However, he said his symptoms were similar to, but not as severe as, his presentation in March. His history is notable for MI angioplasty in 1985, followed by two stents secondary to restenoses.  He had bypass surgery in 1983 and, more recently, he had a complex intervention on a saphenous vein graft to his circumflex branches in March.  He was admitted for cardiac catheterization. He also has a history of hyperlipidemia.  LABORATORY DATA:  EKG showed sinus bradycardia, no acute changes.  Chest x-ray showed new left retrocardiac infiltrate or atelectasis.  CKs and troponins were negative for myocardial infarction.  H&H was 15.4 and 44.8, normal indices, platelets 198, wbcs 4.6.  Sodium 141, potassium 4.1, BUN 16, creatinine 1.1, glucose 126.  PT 12.6, PTT 25.  HOSPITAL COURSE:  Larry Lopez underwent cardiac catheterization on June 12 by Dr.  Gerri Spore.  According to his progress note, he had an EF of 62%, mild anterior hypokinesis, no MR.  He had native three-vessel coronary artery disease.  The saphenous vein graft to the RCA had less than 30% irregularities and the distal saphenous vein graft to the RCA stent was patent.  The saphenous vein graft to the LAD had a proximal less than 30% lesion.  There was noted to be some distal lesions in the branches off the LAD.  The saphenous vein graft to the OM-1, OM-2, and OM-3 had a 60% proximal in-stent restenosis, a mid 99% focal in-stent restenosis, and a 90% stenosis at the anastomosis of the OM-2.  Dr. Gerri Spore, after reviewing the information with the patient, felt that intervention on the saphenous vein graft to the circumflex branches was in order, with consideration, if there were to be any restenoses, to think about radiation therapy.  Thus, Dr. Gerri Spore angioplastied the mid stent lesion of 99% to less than 20%, restoring TIMI 3 flow.  The proximal in-stent stenosis reduced from 60-25% and the distal anastomosis from 90 to 25% without difficulty.  Post sheath removal and bedrest, he was ambulating the hall without difficulty.  Catheterization site was intact.  Integrilin was discontinued after 20 hours.  Post procedural CK and troponin were unremarkable.  H&H was 15.6  and 43.4, normal indices, platelets 188, wbcs 5.5.  Sodium 140, potassium 4.1, BUN 10, creatinine 1.1, glucose 98.  Post EKG did not show any acute changes.  On June 13, he was ambulating the hall without difficulty and it was felt that he could be discharged home.  DISCHARGE DIAGNOSES:  Unstable angina, status post angioplasty utilizing a cutting balloon on three lesions in the saphenous vein graft to the first obtuse marginal, second obtuse marginal, and third obtuse marginal.  History as previously described.  DISPOSITION:  He is discharged home.  MEDICATIONS: 1. Plavix 75 mg q.d. 2. Enteric-coated  aspirin 325 q.d. 3. Hyzaar 50/12.5 q.d. 4. Lipitor 20 mg q.h.s. 5. Toprol XL 25 mg q.d. 6. Sublingual nitroglycerin as needed. 7. His eyedrops.  ACTIVITY:  He was advised no lifting, driving, sexual activity, or heavy exertion for two days.  He was given permission to resume cardiac rehab on Monday.  FOLLOW-UP:  He will see Dr. Corinda Gubler on Thursday, June 28 at 3:15 p.m.  WOUND CARE:  If he had any problems with his cardiac catheterization site, he was asked to call us immediately. DD:  06/24/99 TD:  06/24/99 Job: [id number]

## 2010-05-29 NOTE — Assessment & Plan Note (Signed)
[facility]                            CARDIOLOGY OFFICE NOTE   NAME:TURCOTJahi, Roza                       MRN:          [id number]  DATE:04/07/2006                            DOB:          07-Apr-1926    Mr. Snedeker is a very pleasant 77 year old white married male 25 years  post CABG by Dr. Nathanial Rancher and 6-and-a-half years post redo CABG by Dr.  Evelene Croon.  The patient has remained asymptomatic.  He exercises  regularly.  His cholesterol has been well controlled.  He has not  tolerated beta blockers in the past and they were discontinued February  2001 because of significant sinus bradycardia.  Blood pressure has been  well controlled.  He follows this closely at home and the systolic  pressures at home have been in the 110-120s range.   He is on Lipitor 40, aspirin 81, Plavix 75, furosemide 20, Norvasc 5.  Blood pressure is 139/76, pulse 53, sinus bradycardia.  General  appearance is normal.  JVP is not elevated.  Lungs are clear to  percussion and auscultation.  Cardiac exam reveals no murmur or gallop.  Abdominal exam is normal.  Extremities normal.  Pulse palpable and equal  bilaterally.  EKG reveals sinus bradycardia, rate of 53.  There is T-  wave inversion V1 through V2 which is unchanged.   I am certainly pleased the patient is getting along so well.  His  diagnoses are as above.  We will plan to check a BMP, LFTs, and lipid,  and I have suggested he follow up with Dr. Gala Romney in 6 months or  p.r.n.     E. Graceann Congress, MD, [facility]  Electronically Signed    EJL/MedQ  DD: 04/07/2006  DT: 04/07/2006  Job #: [id number]

## 2010-05-29 NOTE — Discharge Summary (Signed)
[place]  Patient:    Larry Lopez, Larry Lopez                       MRN: [id number] Adm. Date:  [id number] Disc. Date: [id number] Attending:  Pricilla Riffle Dictator:   Leonides Cave, P.A. CC:         Cecil Cranker, M.D., [facility]             Velda Shell L. Evonnie Dawes, M.D. [facility]                           Discharge Summary  DATE OF BIRTH:  [birth year]  PRIMARY CARE PHYSICIAN:  Mahala Menghini. Evonnie Dawes, M.D.  DISCHARGE DIAGNOSES: 1. Coronary artery disease. Status post multiple percutaneous coronary    interventions per Bruce R. Juanda Chance, M.D. March 18, 1999. 2. Hyperlipidemia. 3. Remote tobacco.  HISTORY OF PRESENT ILLNESS:  The patient is a 77 year old white male who presented to [facility] on March 16, 1999, complaining of chest discomfort. Sunday prior to admission, he had a 6 to 7-minute pain episode after using a treadmill noted anterior chest pressure and burning with radiation to bilateral arms. The patient continued to exercise, and pain intensified with increased speed on the treadmill. He stopped after 30 minutes, and symptoms gradually decreased, but he also noted a soreness in his chest which has persisted since. One sublingual nitroglycerin 20 minutes after exercise was used which also helped to alleviate his pain. The patient states that this pain does not resemble his pain in the past which was felt to be musculoskeletal in the past. He was admitted for cardiac catheterization. He was started on IV heparin and continued on his home medications. He was also started on Aggrastat and IV nitroglycerin.  HOSPITAL COURSE:  The patient was taken to the catheterization lab by Theron Arista C. Eden Emms, M.D. on March 17, 1999. Results are:  Left main 50% diffuse disease, LAD 100% osteal lesion, circumflex 70% diffuse proximal lesion, OM1 and OM2 100% occluded, 90% distal disease, RCA 100% mid SVG to the RCA with a 30% mid lesion, 50 to 60% distal lesion, SVG to the  OM with a 95% proximal lesion, 95% in-stent restenosis, SVG to LAD with a 20% lesion and a 40 to 50% more distal. LV revealed anterior and basilar hypokinesis with EF of 60%. Noralyn Pick. Eden Emms, M.D. reviewed the case with Everardo Beals. Juanda Chance, M.D., and it was felt that a percutaneous intervention was the best course of action. On March 18, 1999, Bruce R. Juanda Chance, M.D. proceeded to perform PCI. Bruce Elvera Lennox Juanda Chance, M.D. performed PTCA of the SVG to the circumflex artery reducing the 95% lesion to less than 10% residually and also performed PTCA to the SVG to circumflex artery 80% lesion was reduced to less than 20% residually and the patient had a PTCA and stent performed on the RCA. Please see dictated procedure report for full details. The patient is discharged home on the next day, March 19, 1999, on the same medications he came in on with addition of Plavix 75 mg q.d. for four weeks. He will continue nitroglycerin 0.4 p.r.n., enteric-coated aspirin q.d., Lipitor 20 mg q.p.m., Isopto Carpine o.u., and Hyzaar 50/12.5 q.d.  INSTRUCTIONS:  He is instructed to undergo no heavy lifting, driving, or sexual activity for 48 hours. He will continue a low-salt, low-fat, low-cholesterol diet. He was given instructions to observe his catheterization site.  FOLLOW-UP:  He will follow up with E. Graceann Congress, M.D. on Tuesday, March 27 at 2:15 p.m. DD:  03/19/99 TD:  03/19/99 Job: [id number]

## 2010-05-29 NOTE — Discharge Summary (Signed)
[place]  Patient:    HOLLY, PRING                       MRN: [id number] Adm. Date:  [id number] Disc. Date: [id number] Attending:  Cleatrice Burke Dictator:   Joellyn Rued, P.A.C. CC:         [employer], primary care             [employer]. Dellia Cloud, Ph.D. [facility]                  Referring Physician Discharge [place]  SUMMARY OF HISTORY:  Mr. Prosperi is a 77 year old white male who complained of chest soreness radiating into both his arms associated with shortness of breath while walking on the treadmill.  The discomfort eased with a sublingual nitroglycerin.  However, due to its persistence, he presented to the hospital. He had the same experience in March and June, but not as severe.  His history is notable for bypass surgery in 1983, with a saphenous vein graft to the LAD and diagonal, saphenous vein graft to the OM-1, OM-2, and OM-3, and a saphenous vein graft to the RCA.  In 1995, he had a myocardial infarction with angioplasty to the saphenous vein graft to the OM.  It was redilated later that same year, and unsuccessful stent placement in the same vessel.  He did have an intervention for in-stent restenosis in the mid graft, and a stent placed in the saphenous vein graft to the RCA in March by Dr. Juanda Chance.  He has also had angioplasty of three different sites in the saphenous vein graft to the OM-1, OM-2, and OM-3 in June by Dr. Gerri Spore.  His ejection fraction at that time was 62% with anterior hypokinesis.  His history is also notable for hyperlipidemia, and stress.  LABORATORY DATA:  CKs and MBs and troponins were negative for myocardial infarction.  Admission PT was 11.7, PTT 25.  H&H 15.1 and 41.7, MCHC was slightly elevated at 36.2, platelets 150, WBC 4.6.  Chest x-ray did not show any active disease.  There is mild cardiomegaly and slight elevation of the left hemidiaphragm, which was felt to be improved compared to  prior studies.  His EKG showed sinus bradycardia with a rate of 48, nonspecific ST-T wave changes.  HOSPITAL COURSE:  Mr. Jeanbaptiste was admitted to the hospital.  With his multiple interventions in the past, it was felt that catheterization was the best evaluation for Mr. Correll.  It was felt that if his stent had reclosed, that brachytherapy would be initiated and radiology/oncology saw him in anticipation of this.  Catheterization was performed on October 31 by Dr. Gerri Spore.  This revealed, according to the progress note, native three-vessel coronary artery disease. The saphenous vein graft to the RCA had a 30/50/30% lesion and there was less than 10% stent restenosis.  The saphenous vein graft to the LAD had a 50% proximal lesion.  The saphenous vein graft to the OM-1, OM-2, and OM-3 had multiple lesions, with a 95% in-stent restenosis.  CVTS was consulted, and family is considering surgery.  After arrangements were made, it was felt that he could be discharged home with readmission for bypass surgery.  DISCHARGE DIAGNOSES: 1. Unstable angina. 2. Progressive coronary artery disease.  DISPOSITION:  He is discharged home.  MEDICATIONS: 1. Coated aspirin 325 q.d. 2. Lipitor 20 q.h.s. 3. Hyzaar 50/12.25 q.d. 4. Toprol-XL 50 one-half tablet q.d. 5.  Isopto Carpine 1% eyedrops as previously. 6. Sublingual nitroglycerin as needed. 7. Ativan 1 mg t.i.d. as needed.  ACTIVITY:  No lifting, driving, sexual activity, or heavy exertion for two days.  He was advised to take it easy over the weekend.  SPECIAL INSTRUCTIONS:  If he had any problems, to call or return to the emergency room immediately.  DIET:  He was advised a low salt/fat/cholesterol diet.  WOUND CARE:  If he had any problems with his catheterization site, he was asked to call.  MEDICATION INSTRUCTIONS:  He was instructed to not take his Plavix.  FOLLOW-UP:  To report as scheduled per CVTS. DD:  11/30/99 TD:   11/30/99 Job: [id number]

## 2010-05-29 NOTE — Cardiovascular Report (Signed)
[place]  Patient:    Larry Lopez, Larry Lopez                       MRN: [id number] Proc. Date: 11/11/99 Adm. Date:  [id number] Attending:  Talitha Givens CC:         Cecil Cranker, M.D. [facility]  [facility]  Cardiac catheterization lab   Cardiac Catheterization  PROCEDURE PERFORMED:  Left heart cardiac catheterization, coronary angiography, bypass graft angiography, and left ventriculography.  INDICATION:  [name] is a 77 year-old male with a history of coronary artery bypass grafting in 1983. He has had multiple previous percutaneous interventions to his vein grafts. Most recently was treated for in-stent restenosis in the sequential saphenous vein graft to three obtuse marginal branches. He presented to the hospital again with unstable angina.  PROCEDURES:  A 6-French sheath was placed in the right femoral artery. Catheters utilized included a 6-French JL4, 6-French JR4, 6-French RCD, and 6-French angle pigtail. Contrast was Omnipaque. There were no complications.  RESULTS:  HEMODYNAMICS:  Left ventricular pressure:  124/20. Aortic pressure:  130/64. No aortic valve gradient.  Left ventriculogram:  Wall motion is normal. Ejection fraction estimated at 60%. No mitral regurgitation.  CORONARY ARTERIOGRAPHY ( ______ ):  Left main has a tubular 50% stenosis in the distal portion.  Left circumflex has an ostial 60% stenosis. In the distal circumflex before the posterolateral branches is a 60% stenosis followed by an 80% stenosis at the bifurcation at the first posterolateral branch. There is a small first posterolateral branch which has an 80% stenosis and a small second posterolateral branch. There are three obtuse marginals arising from the proximal to mid circumflex which were 100% occluded proximally.  The left anterior descending artery is 100% occluded at its origin.  Right coronary artery has a diffuse  70% stenosis in the proximal vessel and 100% occluded in the mid vessel after a right ventricular branch.  Saphenous vein graft to the distal left anterior descending artery has a 50% stenosis in the proximal body of the graft. The remainder of the graft is patent and is anastomosed to the distal LAD. The distal LAD beyond the graft insertion has a diffuse 30% stenosis. The mid LAD proximal to the graft insertion has a diffuse 70% stenosis. There is a first diagonal branch which also fills retrograde via this graft.  There is a sequential saphenous vein graft which is described as being anastomosed to OM1, OM2, and OM3. However, the first anastomosis may actually be with the first diagonal branch. Within this graft, in the proximal portion of this graft is an 80% stenosis. Following this 80% is a stent which had been placed in March. Within this stent is a 95% in-stent restenosis. Just beyond the stent is a tubular 80% stenosis. Further down in the distal portion of the graft is a stent which was placed several years ago. This was recently dilated for in-stent restenosis. There is a residual 40% stenosis within this stent. At the first anastomosis with the diagonal versus the first marginal branch there is a 40% stenosis with faint filling of this branch. However, that branch is seen filling via the left anterior descending artery system. This graft then is anastomosed to a second and third marginal branches. There is a 25% in the distal portion of the vein graft.  Saphenous vein graft to the posterior descending artery has a 30% stenosis in  the proximal body, 50% in the mid body, and 30% in the distal body. There is a previously placed stent in the very distal portion of this graft which has less than 10% stenosis within the stent. This is anastomosed to a normal-sized posterior descending artery.  IMPRESSIONS: 1. Normal left ventricular systolic function. 2. Native occlusive  three-vessel coronary artery disease. 3. Recurrent restenosis in the sequential saphenous vein graft to the obtuse    marginal system as described. There is also moderate disease in the vein    grafts to the left anterior descending and right coronary artery.  PLAN:  These images were reviewed with Dr. Juanda Chance. Based on the age of the vein grafts and the patients recurrent problem of restenosis, we feel the best option may be redo coronary artery bypass grafting with the LIMA to the LAD and regrafting of the obtuse marginals and posterior descending artery. This would likely give him a more durable long-term result. The patient will be restarted on heparin and cardiovascular surgery will be consulted. DD:  11/11/99 TD:  11/11/99 Job: [id number]

## 2010-05-29 NOTE — Cardiovascular Report (Signed)
[place]  Patient:    Larry Lopez, Larry Lopez                       MRN: [id number] Proc. Date: 03/18/99 Adm. Date:  [id number] Disc. Date: [id number] Attending:  Pricilla Riffle CC:         Mahala Menghini. Evonnie Dawes, M.D. [facility]             E. Graceann Congress, M.D. [facility]             Peter C. Eden Emms, M.D. [facility]             Bruce R. Juanda Chance, M.D. [facility]             Cardiopulmonary Laboratory                        Cardiac Catheterization  CLINICAL HISTORY:  Mr. Massie is 77 years old and had bypass surgery in 1983. n 1995 he had a stent placed to the saphenous vein graft of the circumflex system  with tandem overlying Palmaz iliac stents.  He subsequently had PTCA for in-stent re-stenosis.  He was recently admitted with unstable angina and the diagnostic procedure was performed yesterday by Dr. Eden Emms.  This showed a critical lesion in the proximal portion of the vein graft to the circumflex marginal system as well as significant in-stent re-stenosis.  There was also moderately severe lesion in the distal portion of the vein graft to the right coronary artery.  DESCRIPTION OF PROCEDURE:  The procedure was performed via the right femoral artery using an arterial sheath and 6 French preformed coronary catheters.  A front wall arterial puncture was performed and Visipaque contrast was used.  The patient was given weight-adjusted heparin to prolong the ACT greater than 200 seconds.  He as given double bolus Integrilin and infusion.  We used an AL-1 7 [place] guiding catheter and a Software engineer.  We crossed the lesion in the proximal portion of the vein graft without difficulty.  We initially went in with a 3.0 x 20 mm CrossSail balloon and performed two inflations of 8 and 9 atmospheres for 32 and 39 seconds.  We then went in with a 3.5 x 15 mm cutting balloon and performed a total of 7 inflations up to 9 atmospheres for 55 seconds within the  stent and in the  segment just proximal to the stent, which was located in the midportion of the vein graft.  We then also used a cutting balloon in the proximal lesion performing two inflations of 6 atmospheres each for 55 and 52 seconds. e then elected to deploy a 3.0 x 13 mm Tetra at the proximal lesion site with two  inflations of 12 and 14 atmospheres for 48 and 30 seconds.  Repeat diagnostic studies were then performed through the guiding catheter.  We next turned our attention to the saphenous vein graft of the right coronary artery.  The distal portion of the vessel had a 70-80% stenosis.  We used a 7 [place] catheter and the same Personal assistant. We crossed the lesion with the wire without difficulty.  We direct stented with a 2.75 x 13 mm Tetra stent with one inflation of 10 atmospheres for 24 seconds. Repeat diagnostic studies were then performed through the guiding catheter. The patient tolerated the procedure well and left the laboratory in satisfactory condition.  RESULTS:  Initially  the stenosis in the proximal portion of the vein graft to the circumflex system was estimated at 95%.  Following PTCA and stenting this improved to less than 10%.  The lesion within the stent in the midportion of the vein graft to the circumflex artery was estimated at 80% initially and was the ______ site of diffuse in-stent re-stenosis.  Following use of the cutting balloon this improved to less than 20%.  The lesion in the distal portion of the vein graft to the right coronary artery was initially estimated at 70-80%.  Following stenting, this improved to 0%.  CONCLUSIONS: 1. Coronary artery disease, status post coronary artery bypass graft surgery in    1993 and status post stenting of the saphenous vein graft to the marginal branch    of the circumflex artery in 1995 with subsequent percutaneous transluminal    coronary angioplasty for in-stent re-stenosis in 1995,  with 95% stenosis in he    proximal portion of the vein graft, the circumflex artery, 80% stenosis within    the stent in the midportion of the vein graft to the circumflex artery, and    70-80% stenosis in the distal portion to the right coronary artery, patent    vein graft to the left anterior descending and good left ventricular function is    documented by Dr. Eden Emms. 2. Successful stenting of the proximal lesion in the vein graft to the marginal  vessel with improvement in percent diameter narrowing from 95% to less than    10%.  Successful percutaneous transluminal coronary angioplasty for in-stent    re-stenosis of the stent in the midportion of the vein graft to the circumflex    marginal vessel with improvement in percent diameter narrowing from 80% to less    than 20%. 3. Successful stenting of the lesion in the vein graft to the right coronary artery    with improvement in percent diameter narrowing from 70-80% to 0%.  DISPOSITION:  The patient was returned to the postangioplasty for further observation. DD:  03/18/99 TD:  03/19/99 Job: [id number]

## 2010-05-29 NOTE — Letter (Signed)
September 16, 2005     Mr. and Mrs. [facility]  [address]  [place], [place] [address]   RE:  Larry Lopez, Larry Lopez  MRN:  [id number]  /  DOB:  Apr 28, 1926   Dear [name] and [name],   Thank you so much for your thoughtfulness in bringing the wonderful bottle  of [employer] wine.  You are so nice to think of me when you come in for  your visits.   I am certainly pleased that your are continuing to get along so well.    Sincerely,      E. Graceann Congress, MD, [facility]   EJL/MedQ  DD:  09/16/2005  DT:  09/16/2005  Job #:  [id number]

## 2010-05-29 NOTE — Letter (Signed)
April 07, 2006    Mr. and Mrs. [name] Madole  [address]  [place], [place]  [address]   RE:  STACI, CARVER  MRN:  [id number]  /  DOB:  1926/12/18   Dear [name] and [name]:   It was so nice to see you all this week and I am pleased that you are  both getting along so well.  It has been a pleasure to care for you over  the past quarter century and I know you will continue to do well.   Thank you so much for the special bottle of [employer] [phone].  I will certainly look for a special occasion to enjoy this  and will think of you.   As you suggested, I hope that we can get together for dinner some time  in the future when my friend, Darl Pikes, is here.   Once again, it has been a pleasure and a privilege to care for you all  and please do not hesitate to call me at any time.    Sincerely,      E. Graceann Congress, MD, [facility]  Electronically Signed    EJL/MedQ  DD: 04/07/2006  DT: 04/07/2006  Job #: [id number]

## 2010-06-02 ENCOUNTER — Encounter: Payer: Self-pay | Admitting: Internal Medicine

## 2010-06-05 ENCOUNTER — Encounter: Payer: Self-pay | Admitting: Internal Medicine

## 2010-06-05 ENCOUNTER — Ambulatory Visit (INDEPENDENT_AMBULATORY_CARE_PROVIDER_SITE_OTHER): Payer: Medicare Other | Admitting: Internal Medicine

## 2010-06-05 VITALS — BP 126/78 | HR 60 | Ht 71.0 in | Wt 162.0 lb

## 2010-06-05 DIAGNOSIS — I4891 Unspecified atrial fibrillation: Secondary | ICD-10-CM

## 2010-06-05 DIAGNOSIS — I2510 Atherosclerotic heart disease of native coronary artery without angina pectoris: Secondary | ICD-10-CM

## 2010-06-05 DIAGNOSIS — I10 Essential (primary) hypertension: Secondary | ICD-10-CM

## 2010-06-05 NOTE — Assessment & Plan Note (Signed)
Quiescent. Continue coumadin.  

## 2010-06-05 NOTE — Progress Notes (Signed)
HPI:  Larry Lopez is a very pleasant 77 year old male with a history of a coronary artery disease status post redo bypass surgery in 2002 by Dr. Laneta Simmers.[name] He also has a history of hypertension and hyperlipidemia, PAF complicated by  tachy-brady syndrome with significant resting bradycardia prohibiting AV-Nodal blockade. Was hospitalized in 6/10 with rapid AF.  He saw Dr. Graciela Husbands last year for consideration of pacemaker but it was thought that as AF is infrequent and self-limiting and his bradycardia is asymptomatic  that we would watch it for now and avoid AV-nodal blockers due to bradycardia. IF AF burden increased would then consider pacing and re-insitutuion of AV-nodal blockers.  He returns for routine f/u. He is doing great. [facility] at least 4 times a week and aerobics classes and occasional treadmill. He feels great.  He denies any chest pain or shortness of breath no lower extremity edema. A few months ago had a few hour episode of AF which resolved. Tolerating coumadin well. No bleeding. INR always 2.3-2.4.    ROS: All systems negative except as listed in HPI, PMH and Problem List.  Past Medical History  Diagnosis Date  . CAD (coronary artery disease)   . HTN (hypertension)   . HLD (hyperlipidemia)   . Bradycardia   . Glaucoma   . Atrial fibrillation   . Tachy-brady syndrome     Current Outpatient Prescriptions  Medication Sig Dispense Refill  . amLODipine (NORVASC) 2.5 MG tablet Take 2.5 mg by mouth daily.        [name] aspirin 81 MG tablet Take 81 mg by mouth daily.        [name] atorvastatin (LIPITOR) 40 MG tablet Take 1 tablet by mouth daily.      . Calcium Carbonate (CALCIUM 600) 1500 MG TABS Take by mouth daily.        . chlorthalidone (HYGROTON) 25 MG tablet Take 25 mg by mouth daily.        . metoprolol tartrate (LOPRESSOR) 25 MG tablet Take 25 mg by mouth daily as needed.        . Multiple Vitamin (MULTIVITAMIN) tablet Take 1 tablet by mouth daily.        .  nitroGLYCERIN (NITROSTAT) 0.4 MG SL tablet Place 0.4 mg under the tongue every 5 (five) minutes as needed.        . pilocarpine (ISOPTO CARPINE) 1 % ophthalmic solution 1 drop 3 (three) times daily.        . TRAVATAN Z 0.004 % ophthalmic solution Take 1 tablet by mouth daily.      [name] warfarin (COUMADIN) 2.5 MG tablet Take by mouth as directed.           PHYSICAL EXAM: Filed Vitals:   06/05/10 1457  BP: 126/78  Pulse: 60   General:  Well appearing. Thin. No resp difficulty HEENT: normal Neck: supple. JVP flat. Carotids 2+ bilaterally; no bruits. No lymphadenopathy or thryomegaly appreciated. Cor: PMI normal. Regular rate & rhythm. No rubs, gallops or murmurs. Lungs: clear Abdomen: soft, nontender, nondistended. No hepatosplenomegaly. No bruits or masses. Good bowel sounds. Extremities: no cyanosis, clubbing, rash, edema Neuro: alert & orientedx3, cranial nerves grossly intact. Moves all 4 extremities w/o difficulty. Affect pleasant.    ECG: NSR 60 TWI v1-v2   ASSESSMENT & PLAN:

## 2010-06-05 NOTE — Patient Instructions (Signed)
Your physician wants you to follow-up in:  6 months. You will receive a reminder letter in the mail two months in advance. If you don't receive a letter, please call our office to schedule the follow-up appointment.   

## 2010-06-05 NOTE — Assessment & Plan Note (Signed)
No evidence of ischemia. Continue current regimen.   

## 2010-06-05 NOTE — Assessment & Plan Note (Signed)
Blood pressure well controlled. Continue current regimen.  

## 2010-06-15 ENCOUNTER — Telehealth: Payer: Self-pay | Admitting: Internal Medicine

## 2010-06-15 DIAGNOSIS — E785 Hyperlipidemia, unspecified: Secondary | ICD-10-CM

## 2010-06-15 DIAGNOSIS — I2510 Atherosclerotic heart disease of native coronary artery without angina pectoris: Secondary | ICD-10-CM

## 2010-06-15 NOTE — Telephone Encounter (Signed)
Spoke w/pt orders placed for labs

## 2010-06-15 NOTE — Telephone Encounter (Signed)
Pt wants to have blood work done this Wendnesday pt states dr bensimhom told him he needs blood work. Pt is coming in for his coumadin ck on Wednesday.

## 2010-06-17 ENCOUNTER — Ambulatory Visit (INDEPENDENT_AMBULATORY_CARE_PROVIDER_SITE_OTHER): Payer: Medicare Other | Admitting: *Deleted

## 2010-06-17 ENCOUNTER — Other Ambulatory Visit (INDEPENDENT_AMBULATORY_CARE_PROVIDER_SITE_OTHER): Payer: Medicare Other | Admitting: *Deleted

## 2010-06-17 DIAGNOSIS — I2510 Atherosclerotic heart disease of native coronary artery without angina pectoris: Secondary | ICD-10-CM

## 2010-06-17 DIAGNOSIS — I4891 Unspecified atrial fibrillation: Secondary | ICD-10-CM

## 2010-06-17 DIAGNOSIS — E785 Hyperlipidemia, unspecified: Secondary | ICD-10-CM

## 2010-06-17 LAB — LIPID PANEL
Cholesterol: 173 mg/dL (ref 0–200)
HDL: 64.2 mg/dL (ref >39.00)
LDL Cholesterol: 94 mg/dL (ref 0–99)
Triglycerides: 72 mg/dL (ref 0.0–149.0)
VLDL: 14.4 mg/dL (ref 0.0–40.0)

## 2010-06-17 LAB — BASIC METABOLIC PANEL
BUN: 15 mg/dL (ref 6–23)
Calcium: 9.1 mg/dL (ref 8.4–10.5)
Creatinine, Ser: 0.9 mg/dL (ref 0.4–1.5)
GFR: 87.01 mL/min (ref >60.00)
Potassium: 3.9 mEq/L (ref 3.5–5.1)

## 2010-06-17 LAB — POCT INR: INR: 2.1

## 2010-06-17 LAB — HEPATIC FUNCTION PANEL
Albumin: 4.3 g/dL (ref 3.5–5.2)
Total Bilirubin: 1.1 mg/dL (ref 0.3–1.2)
Total Protein: 6.8 g/dL (ref 6.0–8.3)

## 2010-06-24 ENCOUNTER — Telehealth: Payer: Self-pay | Admitting: *Deleted

## 2010-06-24 NOTE — Telephone Encounter (Signed)
  Patient requesting a return call w/specific numbers of cholesterol. - Called pt - I see results note from cardiology w/directions to change med on 6/6. I gave pt cholesterol info and suggested he call cardiology as Dr Gala Romney has noted to increase lipitor to 80 mg to get LDL <70. He says he will not increase med b/c at 80 mg in the past he had terrible muscle pains. He is very happy w/numbers as they are now and will keep f/u ov w/Dr Norins in August.

## 2010-06-24 NOTE — Telephone Encounter (Signed)
Patient requesting a return call w/specific numbers of cholesterol. I see results note from cardiology w/directions to change med on 6/6. I gave pt cholesterol info and suggested he call cardiology as Dr Gala Romney has noted to increase lipitor to 80 mg to get LDL <70. He says he will not increase med b/c at 80 mg in the past he had terrible muscle pains. He is very happy w/numbers as they are now and will keep f/u ov w/Dr Norins in August.

## 2010-07-29 ENCOUNTER — Ambulatory Visit (INDEPENDENT_AMBULATORY_CARE_PROVIDER_SITE_OTHER): Payer: Medicare Other | Admitting: *Deleted

## 2010-07-29 DIAGNOSIS — I4891 Unspecified atrial fibrillation: Secondary | ICD-10-CM

## 2010-07-30 ENCOUNTER — Other Ambulatory Visit: Payer: Self-pay | Admitting: Internal Medicine

## 2010-08-18 ENCOUNTER — Encounter: Payer: Self-pay | Admitting: Internal Medicine

## 2010-08-19 ENCOUNTER — Encounter: Payer: Self-pay | Admitting: Internal Medicine

## 2010-08-19 ENCOUNTER — Ambulatory Visit (INDEPENDENT_AMBULATORY_CARE_PROVIDER_SITE_OTHER): Payer: Medicare Other | Admitting: Internal Medicine

## 2010-08-19 VITALS — BP 110/62 | HR 58 | Temp 97.1°F | Wt 160.0 lb

## 2010-08-19 DIAGNOSIS — R279 Unspecified lack of coordination: Secondary | ICD-10-CM

## 2010-08-19 DIAGNOSIS — I10 Essential (primary) hypertension: Secondary | ICD-10-CM

## 2010-08-19 DIAGNOSIS — E785 Hyperlipidemia, unspecified: Secondary | ICD-10-CM

## 2010-08-19 DIAGNOSIS — Z0000 Encounter for general adult medical examination without abnormal findings: Secondary | ICD-10-CM

## 2010-08-19 DIAGNOSIS — Z23 Encounter for immunization: Secondary | ICD-10-CM

## 2010-08-19 DIAGNOSIS — I2510 Atherosclerotic heart disease of native coronary artery without angina pectoris: Secondary | ICD-10-CM

## 2010-08-19 DIAGNOSIS — I4891 Unspecified atrial fibrillation: Secondary | ICD-10-CM

## 2010-08-19 MED ORDER — TETANUS-DIPHTH-ACELL PERTUSSIS 5-2.5-18.5 LF-MCG/0.5 IM SUSP: 0.5000 mL | Freq: Once | INTRAMUSCULAR | Status: DC

## 2010-08-19 NOTE — Progress Notes (Signed)
Subjective:    Patient ID: Larry Lopez, male    DOB: July 09, 1926, 77 y.o.   MRN: [id number]  HPI  The patient is here for annual Medicare wellness examination and management of other chronic and acute problems. He is feeling very well. His only complaint is that he has nocturia x 3 but this is not really disturbing his sleep.   The risk factors are reflected in the social history.  The roster of all physicians providing medical care to patient - is listed in the Snapshot section of the chart.  Activities of daily living:  The patient is 100% inedpendent in all ADLs: dressing, toileting, feeding as well as independent mobility  Home safety : The patient has smoke detectors in the home. They wear seatbelts.No firearms at home. There is no violence in the home.   There is no risks for hepatitis, STDs or HIV. There is history of blood transfusion. They have no travel history to infectious disease endemic areas of the world.  The patient has seen their dentist in the last six month. They have seen their eye doctor in the last year. They admit to any hearing difficulty and have  had audiologic testing in the last year.  They do not  have excessive sun exposure. Discussed the need for sun protection: hats, long sleeves and use of sunscreen if there is significant sun exposure.   Diet: the importance of a healthy diet is discussed. They do have a healthy diet.  The patient has a regular exercise program: cardio and water aerobics , 1 hour duration, 4 days per week.  The benefits of regular aerobic exercise were discussed.  Depression screen: there are no signs or vegative symptoms of depression- irritability, change in appetite, anhedonia, sadness/tearfullness.  Cognitive assessment: the patient manages all their financial and personal affairs and is actively engaged. They could relate day,date,year and events; recalled 3/3 objects at 3 minutes; performed clock-face test normally.  The  following portions of the patient's history were reviewed and updated as appropriate: allergies, current medications, past family history, past medical history,  past surgical history, past social history  and problem list.  Vision, hearing, body mass index were assessed and reviewed.   During the course of the visit the patient was educated and counseled about appropriate screening and preventive services including : fall prevention , diabetes screening, nutrition counseling, colorectal cancer screening, and recommended immunizations.  Past Medical History  Diagnosis Date  . CAD (coronary artery disease)   . HTN (hypertension)   . HLD (hyperlipidemia)   . Bradycardia   . Glaucoma   . Atrial fibrillation   . Tachy-brady syndrome    Past Surgical History  Procedure Date  . Tonsillectomy and adenoidectomy   . Coronary artery bypass graft    Family History  Problem Relation Age of Onset  . Cancer    . Diabetes    . Coronary artery disease     History   Social History  . Marital Status: Married    Spouse Name: N/A    Number of Children: 2  . Years of Education: N/A   Occupational History  . accountant    Social History Main Topics  . Smoking status: Former Smoker    Quit date: 01/12/1967  . Smokeless tobacco: Never Used  . Alcohol Use: 14.0 oz/week    28 drink(s) per week  . Drug Use: No  . Sexually Active: Yes   Other Topics Concern  . Not on  file   Social History Narrative   [facility], [place]. 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 Has weathered the recession OK with some adjustments. Advanced Care planning - patient does not want cardiac resuscitation - provided a signed "out of facility" order for home display (Aug '12) and did review the MOST form, providing an unsigned copy to be used for home discussion with report  back.         Review of Systems Review of Systems  Constitutional:  Negative for fever, chills, activity change and unexpected weight change.  HEENT:  Negative for hearing loss, ear pain, congestion, neck stiffness and postnasal drip. Negative for sore throat or swallowing problems. Negative for dental complaints.   Eyes: Negative for vision loss or change in visual acuity.  Respiratory: Negative for chest tightness and wheezing.   Cardiovascular: Negative for chest pain and palpitation. No decreased exercise tolerance Gastrointestinal: No change in bowel habit. No bloating or gas. No reflux or indigestion Genitourinary: Negative for urgency, frequency, flank pain and difficulty urinating.  Musculoskeletal: Negative for myalgias, back pain, arthralgias and gait problem.  Neurological: Negative for dizziness, tremors, weakness and headaches.  Hematological: Negative for adenopathy.  Psychiatric/Behavioral: Negative for behavioral problems and dysphoric mood.       Objective:   Physical Exam Vital signs reviewed - normal Gen'l: Well nourished well developed white male in no acute distress looking fit HEENT:  Head: Normocephalic and atraumatic.  Right Ear: External ear normal. EAC/TM nl Left Ear: External ear normal.  EAC/TM nl Nose: Nose normal.  Mouth/Throat: Oropharynx is clear and moist. Dentition - native, in good repair. No buccal or palatal lesions. Posterior pharynx clear. Eyes: Conjunctivae and sclera clear. EOM intact. Pupils are constricted but equal, round, and reactive to light. Right eye exhibits no discharge. Left eye exhibits no discharge. Neck: Normal range of motion. Neck supple. No JVD present. No tracheal deviation present. No thyromegaly present.  Cardiovascular: Normal rate, regular rhythm, no gallop, no friction rub, no murmur heard.      Quiet precordium. 2+ radial and DP pulses . No carotid bruits Pulmonary/Chest: Effort normal. No respiratory distress or  increased WOB, no wheezes, no rales. No chest wall deformity or CVAT. Abdominal: Soft. Bowel sounds are normal in all quadrants. He exhibits no distension, no tenderness, no rebound or guarding, No heptosplenomegaly  Genitourinary:   Musculoskeletal: Normal range of motion. He exhibits no edema and no tenderness.       Small and large joints without redness, synovial thickening or deformity. Full range of motion preserved about all small, median and large joints.  Lymphadenopathy:    He has no cervical or supraclavicular adenopathy.  Neurological: He is alert and oriented to person, place, and time. CN II-XII intact. DTRs 2+ and symmetrical biceps, radial and patellar tendons. Cerebellar function normal with no tremor, rigidity, normal gait and station.  Skin: Skin is warm and dry. No rash noted. No erythema.  Psychiatric: He has a normal mood and affect. His behavior is normal. Thought content normal.   Lab Results  Component Value Date   WBC 6.8 06/17/2008   HGB 15.6 06/17/2008   HCT 45.1 06/17/2008   PLT 195 06/17/2008   CHOL 173 06/17/2010   TRIG 72.0 06/17/2010   HDL 64.20 06/17/2010   ALT 25 06/17/2010   AST 28 06/17/2010   NA  141 06/17/2010   K 3.9 06/17/2010   CL 104 06/17/2010   CREATININE 0.9 06/17/2010   BUN 15 06/17/2010   CO2 28 06/17/2010   TSH 1.03 07/23/2008   INR 1.9 07/29/2010   Lab Results  Component Value Date   LDLCALC 94 06/17/2010    No indication to repeat labs at this date.          Assessment & Plan:

## 2010-08-22 ENCOUNTER — Encounter: Payer: Self-pay | Admitting: Internal Medicine

## 2010-08-22 DIAGNOSIS — Z0000 Encounter for general adult medical examination without abnormal findings: Secondary | ICD-10-CM | POA: Insufficient documentation

## 2010-08-22 NOTE — Assessment & Plan Note (Signed)
Last LDL at 94 is close to goal.   Plan - no change in medication           Recommend improved vigilance re: low fat diet.

## 2010-08-22 NOTE — Assessment & Plan Note (Signed)
On exam he demonstrated a normal gait with minimal broadening of his base.

## 2010-08-22 NOTE — Assessment & Plan Note (Signed)
Interval history is benign. Physical exam (per Art Laural Benes MS III) is normal. Lab results from June are excellent. He is current with colorectal cancer screening with last exam Aug '03. Immunizations: Tdap - today, pneumonia vaccine May '05; Shingles vaccine March '10. Aged out of prostate cancer screening. Previous EKG reviewed - no active ischemia.  In summary - a delightful man who is medically stable and doing very well. He will continue his present medical and life-style regimen. He will return the MOST form at his discretion. He is asked to return as needed or in 6 months.

## 2010-08-22 NOTE — Assessment & Plan Note (Signed)
Stable and well controlled with no reported symptoms of dizziness, near-syncope, decreased exercise tolerance. He continues on stroke risk reduction with use of warfarin.  Plan - per cardiology

## 2010-08-22 NOTE — Assessment & Plan Note (Signed)
No c/o chest pain or other symptoms. He does follow with cardiology on a regular basis.   Plan - continued risk modification with appropriate diet, exercise and adherence to medical regimen

## 2010-08-22 NOTE — Assessment & Plan Note (Signed)
BP Readings from Last 3 Encounters:  08/19/10 110/62  06/05/10 126/78  02/04/10 122/76   Very good control on present medical regimen. No change indicated.

## 2010-09-09 ENCOUNTER — Ambulatory Visit (INDEPENDENT_AMBULATORY_CARE_PROVIDER_SITE_OTHER): Payer: Medicare Other | Admitting: *Deleted

## 2010-09-09 DIAGNOSIS — Z7901 Long term (current) use of anticoagulants: Secondary | ICD-10-CM | POA: Insufficient documentation

## 2010-09-09 DIAGNOSIS — I4891 Unspecified atrial fibrillation: Secondary | ICD-10-CM

## 2010-09-21 ENCOUNTER — Other Ambulatory Visit: Payer: Self-pay | Admitting: Internal Medicine

## 2010-10-21 ENCOUNTER — Encounter: Payer: Medicare Other | Admitting: *Deleted

## 2010-10-22 ENCOUNTER — Ambulatory Visit (INDEPENDENT_AMBULATORY_CARE_PROVIDER_SITE_OTHER): Payer: Medicare Other | Admitting: *Deleted

## 2010-10-22 DIAGNOSIS — I4891 Unspecified atrial fibrillation: Secondary | ICD-10-CM

## 2010-12-02 ENCOUNTER — Ambulatory Visit (INDEPENDENT_AMBULATORY_CARE_PROVIDER_SITE_OTHER): Payer: Medicare Other | Admitting: *Deleted

## 2010-12-02 DIAGNOSIS — Z7901 Long term (current) use of anticoagulants: Secondary | ICD-10-CM

## 2010-12-02 DIAGNOSIS — I4891 Unspecified atrial fibrillation: Secondary | ICD-10-CM

## 2011-01-01 ENCOUNTER — Other Ambulatory Visit: Payer: Self-pay | Admitting: Internal Medicine

## 2011-01-04 NOTE — Telephone Encounter (Signed)
Coumadin refill

## 2011-01-14 ENCOUNTER — Ambulatory Visit (INDEPENDENT_AMBULATORY_CARE_PROVIDER_SITE_OTHER): Payer: Medicare Other | Admitting: *Deleted

## 2011-01-14 DIAGNOSIS — Z7901 Long term (current) use of anticoagulants: Secondary | ICD-10-CM | POA: Diagnosis not present

## 2011-01-14 DIAGNOSIS — I4891 Unspecified atrial fibrillation: Secondary | ICD-10-CM

## 2011-01-18 DIAGNOSIS — H40039 Anatomical narrow angle, unspecified eye: Secondary | ICD-10-CM | POA: Insufficient documentation

## 2011-01-18 DIAGNOSIS — H4011X0 Primary open-angle glaucoma, stage unspecified: Secondary | ICD-10-CM | POA: Diagnosis not present

## 2011-01-18 DIAGNOSIS — H2510 Age-related nuclear cataract, unspecified eye: Secondary | ICD-10-CM | POA: Diagnosis not present

## 2011-01-18 DIAGNOSIS — H409 Unspecified glaucoma: Secondary | ICD-10-CM | POA: Diagnosis not present

## 2011-01-18 DIAGNOSIS — H401190 Primary open-angle glaucoma, unspecified eye, stage unspecified: Secondary | ICD-10-CM | POA: Insufficient documentation

## 2011-02-25 ENCOUNTER — Ambulatory Visit (INDEPENDENT_AMBULATORY_CARE_PROVIDER_SITE_OTHER): Payer: Medicare Other | Admitting: *Deleted

## 2011-02-25 DIAGNOSIS — Z7901 Long term (current) use of anticoagulants: Secondary | ICD-10-CM | POA: Diagnosis not present

## 2011-02-25 DIAGNOSIS — I4891 Unspecified atrial fibrillation: Secondary | ICD-10-CM

## 2011-03-11 DIAGNOSIS — H21569 Pupillary abnormality, unspecified eye: Secondary | ICD-10-CM | POA: Diagnosis not present

## 2011-03-11 DIAGNOSIS — H5340 Unspecified visual field defects: Secondary | ICD-10-CM | POA: Diagnosis not present

## 2011-03-11 DIAGNOSIS — H4020X0 Unspecified primary angle-closure glaucoma, stage unspecified: Secondary | ICD-10-CM | POA: Diagnosis not present

## 2011-03-11 DIAGNOSIS — H259 Unspecified age-related cataract: Secondary | ICD-10-CM | POA: Diagnosis not present

## 2011-03-15 ENCOUNTER — Other Ambulatory Visit (HOSPITAL_COMMUNITY): Payer: Self-pay | Admitting: Internal Medicine

## 2011-03-16 ENCOUNTER — Ambulatory Visit (INDEPENDENT_AMBULATORY_CARE_PROVIDER_SITE_OTHER): Payer: Medicare Other | Admitting: Internal Medicine

## 2011-03-16 ENCOUNTER — Encounter: Payer: Self-pay | Admitting: Internal Medicine

## 2011-03-16 ENCOUNTER — Ambulatory Visit (INDEPENDENT_AMBULATORY_CARE_PROVIDER_SITE_OTHER): Payer: Medicare Other | Admitting: Pharmacist

## 2011-03-16 VITALS — BP 124/58 | HR 62 | Ht 71.0 in | Wt 161.0 lb

## 2011-03-16 DIAGNOSIS — Z7901 Long term (current) use of anticoagulants: Secondary | ICD-10-CM | POA: Diagnosis not present

## 2011-03-16 DIAGNOSIS — I10 Essential (primary) hypertension: Secondary | ICD-10-CM | POA: Diagnosis not present

## 2011-03-16 DIAGNOSIS — I4891 Unspecified atrial fibrillation: Secondary | ICD-10-CM

## 2011-03-16 DIAGNOSIS — I2510 Atherosclerotic heart disease of native coronary artery without angina pectoris: Secondary | ICD-10-CM | POA: Diagnosis not present

## 2011-03-16 NOTE — Assessment & Plan Note (Signed)
Doing well. Maintaining SR. Continue coumadin.

## 2011-03-16 NOTE — Assessment & Plan Note (Signed)
Goal LDL < 70. Recheck lipids/CMET. Continue statin. Adjust as needed.

## 2011-03-16 NOTE — Patient Instructions (Signed)
Your physician wants you to follow-up in: 6 MONTHS IN THE [facility] AT [phone] You will receive a reminder letter in the mail two months in advance. If you don't receive a letter, please call our office to schedule the follow-up appointment.   Your physician recommends that you return for lab work in: WHEN ABLE-FASTING AT [address]

## 2011-03-16 NOTE — Progress Notes (Signed)
HPI:  [name] is a very pleasant 78 year old male with a history of a coronary artery disease status post redo bypass surgery in 2002 by Dr. Laneta Simmers.[name] He also has a history of hypertension and hyperlipidemia, PAF complicated by  tachy-brady syndrome with significant resting bradycardia prohibiting AV-Nodal blockade. Was hospitalized in 6/10 with rapid AF.  He saw Dr. Graciela Husbands two years ago for consideration of pacemaker but it was thought that as AF is infrequent and self-limiting and his bradycardia is asymptomatic  that we would watch it for now and avoid AV-nodal blockers due to bradycardia. If AF burden increased would then consider pacing and re-insitutuion of AV-nodal blockers.  He returns for routine f/u. He is doing great. [facility] at least 4 times a week and aerobics classes and occasional treadmill/exercise ike. He feels great.  He denies any chest pain or shortness of breath no lower extremity edema. Has not had known episode of AF since March 2012.Tolerating coumadin well. No bleeding. INR recently was 1.8 but last check 2.0.    SBP usually 120-130.   Lab Results  Component Value Date   CHOL 173 06/17/2010   HDL 64.20 06/17/2010   LDLCALC 94 06/17/2010   TRIG 72.0 06/17/2010   CHOLHDL 3 06/17/2010      ROS: All systems negative except as listed in HPI, PMH and Problem List.  Past Medical History  Diagnosis Date  . CAD (coronary artery disease)   . HTN (hypertension)   . HLD (hyperlipidemia)   . Bradycardia   . Glaucoma   . Atrial fibrillation   . Tachy-brady syndrome     Current Outpatient Prescriptions  Medication Sig Dispense Refill  . amLODipine (NORVASC) 2.5 MG tablet Take 2.5 mg by mouth daily.        [name] aspirin 81 MG tablet Take 81 mg by mouth daily.        [name] atorvastatin (LIPITOR) 40 MG tablet TAKE 1 TABLET BY MOUTH ONCE DAILY  90 tablet  3  . Calcium Carbonate (CALCIUM 600) 1500 MG TABS Take by mouth daily.        . chlorthalidone (HYGROTON) 25 MG  tablet Take 25 mg by mouth daily.        . metoprolol tartrate (LOPRESSOR) 25 MG tablet TAKE 1 TABLET BY MOUTH AS NEEDED FOR TACHYCARDIA, AS DIRECTED  30 tablet  11  . Multiple Vitamin (MULTIVITAMIN) tablet Take 1 tablet by mouth daily.        . nitroGLYCERIN (NITROSTAT) 0.4 MG SL tablet Place 0.4 mg under the tongue every 5 (five) minutes as needed.        . pilocarpine (ISOPTO CARPINE) 1 % ophthalmic solution 1 drop 3 (three) times daily.        . TRAVATAN Z 0.004 % ophthalmic solution Take 1 tablet by mouth daily.      [name] warfarin (COUMADIN) 2.5 MG tablet Take 1 tablet (2.5 mg total) by mouth as directed.  100 tablet  1   Current Facility-Administered Medications  Medication Dose Route Frequency Provider Last Rate Last Dose  . TDaP (BOOSTRIX) injection 0.5 mL  0.5 mL Intramuscular Once Duke Salvia, MD         PHYSICAL EXAM: Filed Vitals:   03/16/11 1058  BP: 142/68  Pulse: 62   General:  Well appearing. Thin. No resp difficulty HEENT: normal Neck: supple. JVP flat. Carotids 2+ bilaterally; no bruits. No lymphadenopathy or thryomegaly appreciated. Cor: PMI normal. Regular rate &  rhythm. No rubs, gallops or murmurs. Lungs: clear Abdomen: soft, nontender, nondistended. No hepatosplenomegaly. No bruits or masses. Good bowel sounds. Extremities: no cyanosis, clubbing, rash, edema Neuro: alert & orientedx3, cranial nerves grossly intact. Moves all 4 extremities w/o difficulty. Affect pleasant.    ECG: NSR 62 No ST-T wave abnormalities.    ASSESSMENT & PLAN:

## 2011-03-16 NOTE — Assessment & Plan Note (Signed)
Blood pressure well controlled. Continue current regimen.  

## 2011-03-17 ENCOUNTER — Other Ambulatory Visit (INDEPENDENT_AMBULATORY_CARE_PROVIDER_SITE_OTHER): Payer: Medicare Other

## 2011-03-17 DIAGNOSIS — I2510 Atherosclerotic heart disease of native coronary artery without angina pectoris: Secondary | ICD-10-CM

## 2011-03-17 LAB — LIPID PANEL
Cholesterol: 176 mg/dL (ref 0–200)
HDL: 60.3 mg/dL (ref >39.00)
LDL Cholesterol: 91 mg/dL (ref 0–99)
Triglycerides: 122 mg/dL (ref 0.0–149.0)
VLDL: 24.4 mg/dL (ref 0.0–40.0)

## 2011-03-17 LAB — HEPATIC FUNCTION PANEL
Albumin: 4.1 g/dL (ref 3.5–5.2)
Alkaline Phosphatase: 82 U/L (ref 39–117)
Total Protein: 6.6 g/dL (ref 6.0–8.3)

## 2011-03-23 ENCOUNTER — Telehealth: Payer: Self-pay | Admitting: Internal Medicine

## 2011-03-23 NOTE — Telephone Encounter (Signed)
New problem:  Patient calling regarding lab  test results.

## 2011-03-23 NOTE — Telephone Encounter (Signed)
Lab results read to pt.

## 2011-04-15 ENCOUNTER — Ambulatory Visit (INDEPENDENT_AMBULATORY_CARE_PROVIDER_SITE_OTHER): Payer: Medicare Other

## 2011-04-15 DIAGNOSIS — Z7901 Long term (current) use of anticoagulants: Secondary | ICD-10-CM

## 2011-04-15 DIAGNOSIS — I4891 Unspecified atrial fibrillation: Secondary | ICD-10-CM | POA: Diagnosis not present

## 2011-04-15 LAB — POCT INR: INR: 3.3

## 2011-05-10 ENCOUNTER — Other Ambulatory Visit (HOSPITAL_COMMUNITY): Payer: Self-pay | Admitting: Internal Medicine

## 2011-05-13 ENCOUNTER — Ambulatory Visit (INDEPENDENT_AMBULATORY_CARE_PROVIDER_SITE_OTHER): Payer: Medicare Other

## 2011-05-13 DIAGNOSIS — Z7901 Long term (current) use of anticoagulants: Secondary | ICD-10-CM

## 2011-05-13 DIAGNOSIS — I4891 Unspecified atrial fibrillation: Secondary | ICD-10-CM

## 2011-05-13 LAB — POCT INR: INR: 3.3

## 2011-05-24 ENCOUNTER — Telehealth (HOSPITAL_COMMUNITY): Payer: Self-pay | Admitting: *Deleted

## 2011-05-24 NOTE — Telephone Encounter (Signed)
Mr Pickel called today, he would like to have atrovastatin via mail order with [facility]. [phone]. This will save him $60/month. Please call him back.

## 2011-05-24 NOTE — Telephone Encounter (Signed)
Spoke w/pt new rx faxed to [facility] at [phone]

## 2011-05-30 ENCOUNTER — Other Ambulatory Visit: Payer: Self-pay | Admitting: Internal Medicine

## 2011-06-08 ENCOUNTER — Other Ambulatory Visit (HOSPITAL_COMMUNITY): Payer: Self-pay | Admitting: Internal Medicine

## 2011-06-24 ENCOUNTER — Ambulatory Visit (INDEPENDENT_AMBULATORY_CARE_PROVIDER_SITE_OTHER): Payer: Medicare Other | Admitting: *Deleted

## 2011-06-24 DIAGNOSIS — Z7901 Long term (current) use of anticoagulants: Secondary | ICD-10-CM

## 2011-06-24 DIAGNOSIS — I4891 Unspecified atrial fibrillation: Secondary | ICD-10-CM

## 2011-06-25 DIAGNOSIS — H903 Sensorineural hearing loss, bilateral: Secondary | ICD-10-CM | POA: Diagnosis not present

## 2011-08-05 ENCOUNTER — Ambulatory Visit (INDEPENDENT_AMBULATORY_CARE_PROVIDER_SITE_OTHER): Payer: Medicare Other | Admitting: *Deleted

## 2011-08-05 DIAGNOSIS — Z7901 Long term (current) use of anticoagulants: Secondary | ICD-10-CM | POA: Diagnosis not present

## 2011-08-05 DIAGNOSIS — I4891 Unspecified atrial fibrillation: Secondary | ICD-10-CM | POA: Diagnosis not present

## 2011-09-09 DIAGNOSIS — H40039 Anatomical narrow angle, unspecified eye: Secondary | ICD-10-CM | POA: Diagnosis not present

## 2011-09-09 DIAGNOSIS — H259 Unspecified age-related cataract: Secondary | ICD-10-CM | POA: Diagnosis not present

## 2011-09-09 DIAGNOSIS — H01009 Unspecified blepharitis unspecified eye, unspecified eyelid: Secondary | ICD-10-CM | POA: Diagnosis not present

## 2011-09-15 ENCOUNTER — Encounter: Payer: Self-pay | Admitting: General Practice

## 2011-09-23 ENCOUNTER — Ambulatory Visit (HOSPITAL_COMMUNITY)
Admission: RE | Admit: 2011-09-23 | Discharge: 2011-09-23 | Disposition: A | Discharge status: Home / Self Care | Payer: Medicare Other | Source: Ambulatory Visit | Attending: Internal Medicine | Admitting: Internal Medicine

## 2011-09-23 ENCOUNTER — Ambulatory Visit: Payer: Self-pay | Admitting: Pharmacist

## 2011-09-23 ENCOUNTER — Encounter (HOSPITAL_COMMUNITY): Payer: Self-pay

## 2011-09-23 VITALS — BP 126/58 | HR 60 | Ht 71.0 in | Wt 160.0 lb

## 2011-09-23 DIAGNOSIS — E785 Hyperlipidemia, unspecified: Secondary | ICD-10-CM

## 2011-09-23 DIAGNOSIS — I10 Essential (primary) hypertension: Secondary | ICD-10-CM | POA: Insufficient documentation

## 2011-09-23 DIAGNOSIS — I4891 Unspecified atrial fibrillation: Secondary | ICD-10-CM | POA: Diagnosis not present

## 2011-09-23 DIAGNOSIS — I2510 Atherosclerotic heart disease of native coronary artery without angina pectoris: Secondary | ICD-10-CM | POA: Diagnosis not present

## 2011-09-23 DIAGNOSIS — Z7901 Long term (current) use of anticoagulants: Secondary | ICD-10-CM

## 2011-09-23 LAB — BASIC METABOLIC PANEL
BUN: 12 mg/dL (ref 6–23)
Calcium: 10.3 mg/dL (ref 8.4–10.5)
Chloride: 103 mEq/L (ref 96–112)
Creatinine, Ser: 0.9 mg/dL (ref 0.50–1.35)
GFR calc Af Amer: 89 mL/min — ABNORMAL LOW (ref >90)
GFR calc non Af Amer: 77 mL/min — ABNORMAL LOW (ref >90)

## 2011-09-23 LAB — CBC WITH DIFFERENTIAL/PLATELET
Basophils Absolute: 0.1 10*3/uL (ref 0.0–0.1)
Basophils Relative: 1 % (ref 0–1)
HCT: 48.5 % (ref 39.0–52.0)
Lymphocytes Relative: 24 % (ref 12–46)
MCHC: 35.1 g/dL (ref 30.0–36.0)
Monocytes Absolute: 0.9 10*3/uL (ref 0.1–1.0)
Neutro Abs: 3.4 10*3/uL (ref 1.7–7.7)
Neutrophils Relative %: 58 % (ref 43–77)
Platelets: 182 10*3/uL (ref 150–400)
RDW: 14.6 % (ref 11.5–15.5)
WBC: 5.8 10*3/uL (ref 4.0–10.5)

## 2011-09-23 LAB — PROTIME-INR: Prothrombin Time: 21.3 seconds — ABNORMAL HIGH (ref 11.6–15.2)

## 2011-09-23 NOTE — Assessment & Plan Note (Signed)
Blood pressure well controlled. Continue current regimen.  

## 2011-09-23 NOTE — Progress Notes (Signed)
HPI:  [name] is a very pleasant 78 year old male with a history of a coronary artery disease status post redo bypass surgery in 2002 by Dr. Laneta Simmers. He also has a history of hypertension and hyperlipidemia, PAF complicated by  tachy-brady syndrome with significant resting bradycardia prohibiting AV-Nodal blockade. Was hospitalized in 6/10 with rapid AF.  He saw Dr. Graciela Husbands in 2010 for consideration of pacemaker but it was thought that as AF is infrequent and self-limiting and his bradycardia is asymptomatic  that we would watch it for now and avoid AV-nodal blockers due to bradycardia. If AF burden increased would then consider pacing and re-insitutuion of AV-nodal blockers.  He returns for routine f/u. He is doing great. [facility] and exercising at least 4 times a week on the treadmill and bike vigorously.  He feels great.  He denies any chest pain or shortness of breath no lower extremity edema. Has not had known episode of AF since March 2012.  Lots of bruising with coumadin.    SBP usually 120-130.   Lab Results  Component Value Date   CHOL 176 03/17/2011   HDL 60.30 03/17/2011   LDLCALC 91 03/17/2011   TRIG 122.0 03/17/2011   CHOLHDL 3 03/17/2011      ROS: All systems negative except as listed in HPI, PMH and Problem List.  Past Medical History  Diagnosis Date  . CAD (coronary artery disease)   . HTN (hypertension)   . HLD (hyperlipidemia)   . Bradycardia   . Glaucoma   . Atrial fibrillation   . Tachy-brady syndrome     Current Outpatient Prescriptions  Medication Sig Dispense Refill  . amLODipine (NORVASC) 2.5 MG tablet take 1 tablet by mouth once daily  90 tablet  5  . aspirin 81 MG tablet Take 81 mg by mouth daily.        [name] atorvastatin (LIPITOR) 40 MG tablet TAKE 1 TABLET BY MOUTH ONCE DAILY  90 tablet  3  . Calcium Carbonate (CALCIUM 600) 1500 MG TABS Take by mouth daily.        . chlorthalidone (HYGROTON) 25 MG tablet take 1 tablet by mouth once daily  90 tablet  3  .  metoprolol tartrate (LOPRESSOR) 25 MG tablet TAKE 1 TABLET BY MOUTH AS NEEDED FOR TACHYCARDIA, AS DIRECTED  30 tablet  11  . Multiple Vitamin (MULTIVITAMIN) tablet Take 1 tablet by mouth daily.        . nitroGLYCERIN (NITROSTAT) 0.4 MG SL tablet Place 0.4 mg under the tongue every 5 (five) minutes as needed.        . pilocarpine (ISOPTO CARPINE) 1 % ophthalmic solution 1 drop 3 (three) times daily.        . TRAVATAN Z 0.004 % ophthalmic solution Take 1 tablet by mouth daily.      [name] warfarin (COUMADIN) 2.5 MG tablet Take as directed by Anticoagulation clinic  115 tablet  1   Current Facility-Administered Medications  Medication Dose Route Frequency Provider Last Rate Last Dose  . TDaP (BOOSTRIX) injection 0.5 mL  0.5 mL Intramuscular Once Jacques Navy, MD         PHYSICAL EXAM: Filed Vitals:   09/23/11 0931  BP: 126/58  Pulse: 60   General:  Well appearing. Thin. No resp difficulty HEENT: normal Neck: supple. JVP flat. Carotids 2+ bilaterally; no bruits. No lymphadenopathy or thryomegaly appreciated. Cor: PMI normal. Regular rate & rhythm. No rubs, gallops or murmurs. Lungs: clear Abdomen: soft,  nontender, nondistended. No hepatosplenomegaly. No bruits or masses. Good bowel sounds. Extremities: no cyanosis, clubbing, rash, edema Neuro: alert & orientedx3, cranial nerves grossly intact. Moves all 4 extremities w/o difficulty. Affect pleasant.    ECG: SB 59 TWI v1, v2    ASSESSMENT & PLAN:

## 2011-09-23 NOTE — Addendum Note (Signed)
Encounter addended by: Noralee Space, RN on: 09/23/2011 11:29 AM<BR>     Documentation filed: Patient Instructions Section

## 2011-09-23 NOTE — Assessment & Plan Note (Signed)
At goal. Continue atorvastatin.

## 2011-09-23 NOTE — Assessment & Plan Note (Signed)
Bruising significantly with coumadin. Long talk about risks/benefits of NOACs. Will change to apixaban 5mg  bid when INR < 2.0.

## 2011-09-23 NOTE — Patient Instructions (Addendum)
We will contact you in 6 months to schedule your next appointment.  

## 2011-09-23 NOTE — Assessment & Plan Note (Signed)
No evidence of ischemia. Continue current regimen.   

## 2011-09-24 NOTE — Addendum Note (Signed)
Encounter addended by: Franco Duley S Manuelita Moxon on: 09/24/2011  8:34 AM<BR>     Documentation filed: Charges VN

## 2011-09-24 NOTE — Addendum Note (Signed)
Encounter addended by: Cresenciano Genre on: 09/24/2011  8:33 AM<BR>     Documentation filed: Charges VN

## 2011-10-14 ENCOUNTER — Other Ambulatory Visit (HOSPITAL_COMMUNITY): Payer: Self-pay | Admitting: *Deleted

## 2011-10-14 DIAGNOSIS — Z23 Encounter for immunization: Secondary | ICD-10-CM | POA: Diagnosis not present

## 2011-10-14 MED ORDER — APIXABAN 5 MG PO TABS: 5.0000 mg | ORAL_TABLET | Freq: Two times a day (BID) | ORAL | Status: DC

## 2011-10-21 ENCOUNTER — Ambulatory Visit (INDEPENDENT_AMBULATORY_CARE_PROVIDER_SITE_OTHER): Payer: Medicare Other | Admitting: Internal Medicine

## 2011-10-21 ENCOUNTER — Encounter: Payer: Self-pay | Admitting: Internal Medicine

## 2011-10-21 VITALS — BP 110/58 | HR 60 | Temp 97.0°F | Resp 16 | Wt 159.0 lb

## 2011-10-21 DIAGNOSIS — Z0000 Encounter for general adult medical examination without abnormal findings: Secondary | ICD-10-CM | POA: Diagnosis not present

## 2011-10-21 DIAGNOSIS — I4891 Unspecified atrial fibrillation: Secondary | ICD-10-CM

## 2011-10-21 DIAGNOSIS — I10 Essential (primary) hypertension: Secondary | ICD-10-CM

## 2011-10-21 DIAGNOSIS — E785 Hyperlipidemia, unspecified: Secondary | ICD-10-CM | POA: Diagnosis not present

## 2011-10-21 DIAGNOSIS — H9190 Unspecified hearing loss, unspecified ear: Secondary | ICD-10-CM

## 2011-10-21 DIAGNOSIS — I2510 Atherosclerotic heart disease of native coronary artery without angina pectoris: Secondary | ICD-10-CM

## 2011-10-21 DIAGNOSIS — I872 Venous insufficiency (chronic) (peripheral): Secondary | ICD-10-CM | POA: Diagnosis not present

## 2011-10-21 NOTE — Assessment & Plan Note (Signed)
BP Readings from Last 3 Encounters:  10/21/11 110/58  09/23/11 126/58  03/16/11 124/58   Good control on present medications

## 2011-10-21 NOTE — Assessment & Plan Note (Signed)
On Equis for anticoagulation x 1 month and is doing great. On exam today in sinus rhythm.  Plan - continue present meds.

## 2011-10-21 NOTE — Assessment & Plan Note (Signed)
No chest pain  Or limitation in activity. Follows closely with Dr. Leonard Schwartz

## 2011-10-21 NOTE — Assessment & Plan Note (Signed)
Stable with no peripheral edema on today's exam

## 2011-10-21 NOTE — Assessment & Plan Note (Signed)
Last lab in March '13 - good control.   Plan - continue good diet and medications

## 2011-10-21 NOTE — Progress Notes (Signed)
Subjective:    Patient ID: Larry Lopez, male    DOB: 1926/02/23, 78 y.o.   MRN: [id number]  HPI The patient is here for annual Medicare wellness examination and management of other chronic and acute problems. He reports that he is doing well. He has been followed closely by Dr. Gala Romney - he started on eliquis and has done well.    The risk factors are reflected in the social history.  The roster of all physicians providing medical care to patient - is listed in the Snapshot section of the chart.  Activities of daily living:  The patient is 100% inedpendent in all ADLs: dressing, toileting, feeding as well as independent mobility  Home safety : The patient has smoke detectors in the home. Fall - has had no falls, house is fall safe. They wear seatbelts. No firearms at home  There is no violence in the home.   There is no risks for hepatitis, STDs or HIV. There is no   history of blood transfusion. They have no travel history to infectious disease endemic areas of the world.  The patient has seen their dentist in the last six month. They have seen their eye doctor in the last year. They admit to hearing difficulty and have not had audiologic testing in the last year.    They do not  have excessive sun exposure. Discussed the need for sun protection: hats, long sleeves and use of sunscreen if there is significant sun exposure.   Diet: the importance of a healthy diet is discussed. They do have a healthy diet.  The patient has a regular exercise program: weight training, aerobic , 30-60 min duration, 3-per week.  The benefits of regular aerobic exercise were discussed.  Depression screen: there are no signs or vegative symptoms of depression- irritability, change in appetite, anhedonia, sadness/tearfullness.  Cognitive assessment: the patient manages all their financial and personal affairs and is actively engaged.   The following portions of the patient's history were reviewed and  updated as appropriate: allergies, current medications, past family history, past medical history,  past surgical history, past social history  and problem list.  Vision, hearing, body mass index were assessed and reviewed.   During the course of the visit the patient was educated and counseled about appropriate screening and preventive services including : fall prevention , diabetes screening, nutrition counseling, colorectal cancer screening, and recommended immunizations.  Past Medical History  Diagnosis Date  . CAD (coronary artery disease)   . HTN (hypertension)   . HLD (hyperlipidemia)   . Bradycardia   . Glaucoma(365)   . Atrial fibrillation   . Tachy-brady syndrome    Past Surgical History  Procedure Date  . Tonsillectomy and adenoidectomy   . Coronary artery bypass graft    Family History  Problem Relation Age of Onset  . Cancer    . Diabetes    . Coronary artery disease     History   Social History  . Marital Status: Married    Spouse Name: N/A    Number of Children: 2  . Years of Education: N/A   Occupational History  . accountant    Social History Main Topics  . Smoking status: Former Smoker    Quit date: 01/12/1967  . Smokeless tobacco: Never Used  . Alcohol Use: 14.0 oz/week    28 drink(s) per week  . Drug Use: No  . Sexually Active: Yes   Other Topics Concern  . Not on file  Social History Narrative   HS[employer], [employer]. 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 Has weathered the recession OK with some adjustments. Advanced Care planning - patient does not want cardiac resuscitation - provided a signed "out of facility" order for home display (Aug '12) and did review the MOST form, providing an unsigned copy to be used for home discussion with report back.     Current Outpatient Prescriptions on File  Prior to Visit  Medication Sig Dispense Refill  . amLODipine (NORVASC) 2.5 MG tablet take 1 tablet by mouth once daily  90 tablet  5  . apixaban (ELIQUIS) 5 MG TABS tablet Take 1 tablet (5 mg total) by mouth 2 (two) times daily.  60 tablet  6  . aspirin 81 MG tablet Take 81 mg by mouth daily.        [name] atorvastatin (LIPITOR) 40 MG tablet TAKE 1 TABLET BY MOUTH ONCE DAILY  90 tablet  3  . Calcium Carbonate (CALCIUM 600) 1500 MG TABS Take by mouth daily.        . chlorthalidone (HYGROTON) 25 MG tablet take 1 tablet by mouth once daily  90 tablet  3  . metoprolol tartrate (LOPRESSOR) 25 MG tablet TAKE 1 TABLET BY MOUTH AS NEEDED FOR TACHYCARDIA, AS DIRECTED  30 tablet  11  . Multiple Vitamin (MULTIVITAMIN) tablet Take 1 tablet by mouth daily.        . nitroGLYCERIN (NITROSTAT) 0.4 MG SL tablet Place 0.4 mg under the tongue every 5 (five) minutes as needed.        . pilocarpine (ISOPTO CARPINE) 1 % ophthalmic solution 1 drop 3 (three) times daily.        . TRAVATAN Z 0.004 % ophthalmic solution Place 1 drop into both eyes daily.        Current Facility-Administered Medications on File Prior to Visit  Medication Dose Route Frequency Provider Last Rate Last Dose  . TDaP (BOOSTRIX) injection 0.5 mL  0.5 mL Intramuscular Once Jacques Navy, MD           Review of Systems Constitutional:  Negative for fever, chills, activity change and unexpected weight change.  HEENT:  Negative for hearing loss, ear pain, congestion, neck stiffness and postnasal drip. Negative for sore throat or swallowing problems. Negative for dental complaints.   Eyes: Negative for vision loss or change in visual acuity.  Respiratory: Negative for chest tightness and wheezing. Negative for DOE.   Cardiovascular: Negative for chest pain or palpitations. No decreased exercise tolerance Gastrointestinal: No change in bowel habit. No bloating or gas. No reflux or indigestion Genitourinary: Negative for urgency, frequency, flank  pain and difficulty urinating.  Musculoskeletal: Negative for myalgias, back pain, arthralgias and gait problem.  Neurological: Negative for dizziness, tremors, weakness and headaches. Admits to problems with balance. Hematological: Negative for adenopathy.  Psychiatric/Behavioral: Negative for behavioral problems and dysphoric mood.       Objective:   Physical Exam Filed Vitals:   10/21/11 0906  BP: 110/58  Pulse: 60  Temp: 97 F (36.1 C)  Resp: 16   Wt Readings from Last 3 Encounters:  10/21/11 159 lb (72.122 kg)  09/23/11 160 lb (72.576 kg)  03/16/11 161 lb (73.029 kg)   Gen'l: Well nourished well developed white male in no acute distress  HEENT: Head: Normocephalic and atraumatic. Right Ear: External ear  normal. EAC/TM nl. Left Ear: External ear normal.  EAC/TM nl. Nose: Nose normal. Mouth/Throat: Oropharynx is clear and moist. Dentition - native, in good repair. No buccal or palatal lesions. Posterior pharynx clear. Eyes: Conjunctivae and sclera clear. EOM intact. Right eye exhibits no discharge. Left eye exhibits no discharge. Neck: Normal range of motion. Neck supple. No JVD present. No tracheal deviation present. No thyromegaly present.  Cardiovascular: Normal rate, regular rhythm, no gallop, no friction rub, no murmur heard.      Quiet precordium. 2+ radial and DP pulses . No carotid bruits Pulmonary/Chest: Effort normal. No respiratory distress or increased WOB, no wheezes, no rales. No chest wall deformity or CVAT. Abdomen: Soft. Bowel sounds are normal in all quadrants. He exhibits no distension, no tenderness, no rebound or guarding, No heptosplenomegaly  Genitourinary:  deferred Musculoskeletal: Normal range of motion. He exhibits no edema and no tenderness.       Small and large joints without redness, synovial thickening or deformity. Full range of motion preserved about all small, median and large joints.  Lymphadenopathy:    He has no cervical or supraclavicular  adenopathy.  Neurological: He is alert and oriented to person, place, and time. CN II-XII intact. DTRs 2+ and symmetrical biceps, radial and patellar tendons. Cerebellar function normal with no tremor, rigidity, normal gait and station.  Skin: Skin is warm and dry. No rash noted. No erythema.  Psychiatric: He has a normal mood and affect. His behavior is normal. Thought content normal.   Lab Results  Component Value Date   WBC 5.8 09/23/2011   HGB 17.0 09/23/2011   HCT 48.5 09/23/2011   PLT 182 09/23/2011   GLUCOSE 88 09/23/2011   CHOL 176 03/17/2011   TRIG 122.0 03/17/2011   HDL 60.30 03/17/2011   LDLCALC 91 03/17/2011   ALT 24 03/17/2011   AST 32 03/17/2011   NA 141 09/23/2011   K 5.0 09/23/2011   CL 103 09/23/2011   CREATININE 0.90 09/23/2011   BUN 12 09/23/2011   CO2 30 09/23/2011   TSH 1.03 07/23/2008   INR 1.81* 09/23/2011          Assessment & Plan:

## 2011-10-21 NOTE — Assessment & Plan Note (Signed)
Interval history w/o major illness,surgery or injury. Limited physical exam is normal. Aged out of colorectal and prostate cancer screening. Immunizations are up to date. He exercises on a regular basis. He is advised to do stretch/flex/balance classes to help with his balance problems.  In summary - a very nice man who appears to be medically stable.

## 2011-10-21 NOTE — Assessment & Plan Note (Signed)
Uses hearing aids and is doing OK

## 2011-11-16 ENCOUNTER — Other Ambulatory Visit (HOSPITAL_COMMUNITY): Payer: Self-pay | Admitting: *Deleted

## 2011-11-16 MED ORDER — APIXABAN 5 MG PO TABS: 5.0000 mg | ORAL_TABLET | Freq: Two times a day (BID) | ORAL | Status: DC

## 2011-12-08 DIAGNOSIS — H4011X0 Primary open-angle glaucoma, stage unspecified: Secondary | ICD-10-CM | POA: Diagnosis not present

## 2011-12-13 ENCOUNTER — Telehealth: Payer: Self-pay | Admitting: *Deleted

## 2011-12-13 NOTE — Telephone Encounter (Signed)
Pt called and stated he is taking ELIQUES and needs a note for his eye doctor for upcoming surgery .  Caralee Ates, CMA

## 2011-12-13 NOTE — Telephone Encounter (Signed)
Pt will probably need cataract surgery in the future and wants to know how long he would need to hold his eliquis, will check with Dr Gala Romney and get back to him

## 2011-12-13 NOTE — Telephone Encounter (Signed)
Left message to call back  

## 2011-12-16 NOTE — Telephone Encounter (Signed)
Hold Eliquis for 24 hour prior to surgery.

## 2012-01-18 ENCOUNTER — Telehealth (HOSPITAL_COMMUNITY): Payer: Self-pay | Admitting: Cardiology

## 2012-01-18 NOTE — Telephone Encounter (Signed)
Will send to Dr Bensimhon  

## 2012-01-18 NOTE — Telephone Encounter (Signed)
Pt is requesting a letter to give to eye doctor prior to surgery explaining the risks of bleeding associated with taking Eliquis. Clearance to proceed with surgery.[name]

## 2012-02-03 ENCOUNTER — Encounter (HOSPITAL_COMMUNITY): Payer: Self-pay | Admitting: Internal Medicine

## 2012-02-04 NOTE — Telephone Encounter (Signed)
Letter mailed to pt.  

## 2012-02-14 ENCOUNTER — Other Ambulatory Visit (HOSPITAL_COMMUNITY): Payer: Self-pay | Admitting: *Deleted

## 2012-02-14 MED ORDER — CHLORTHALIDONE 25 MG PO TABS: 25.0000 mg | ORAL_TABLET | Freq: Every day | ORAL | Status: DC

## 2012-03-10 ENCOUNTER — Telehealth (HOSPITAL_COMMUNITY): Payer: Self-pay | Admitting: Cardiology

## 2012-03-10 DIAGNOSIS — Z7901 Long term (current) use of anticoagulants: Secondary | ICD-10-CM

## 2012-03-10 MED ORDER — APIXABAN 5 MG PO TABS: 5.0000 mg | ORAL_TABLET | Freq: Two times a day (BID) | ORAL | Status: DC

## 2012-03-10 NOTE — Telephone Encounter (Signed)
Pt is requesting a 90 day supply of Eliquis

## 2012-03-15 DIAGNOSIS — H538 Other visual disturbances: Secondary | ICD-10-CM | POA: Diagnosis not present

## 2012-03-15 DIAGNOSIS — H40039 Anatomical narrow angle, unspecified eye: Secondary | ICD-10-CM | POA: Diagnosis not present

## 2012-03-15 DIAGNOSIS — H259 Unspecified age-related cataract: Secondary | ICD-10-CM | POA: Diagnosis not present

## 2012-04-03 ENCOUNTER — Ambulatory Visit (HOSPITAL_COMMUNITY)
Admission: RE | Admit: 2012-04-03 | Discharge: 2012-04-03 | Disposition: A | Discharge status: Home / Self Care | Payer: Medicare Other | Source: Ambulatory Visit | Attending: Internal Medicine | Admitting: Internal Medicine

## 2012-04-03 ENCOUNTER — Encounter (HOSPITAL_COMMUNITY): Payer: Self-pay

## 2012-04-03 VITALS — BP 130/70 | HR 61 | Wt 157.8 lb

## 2012-04-03 DIAGNOSIS — I498 Other specified cardiac arrhythmias: Secondary | ICD-10-CM | POA: Diagnosis not present

## 2012-04-03 DIAGNOSIS — E785 Hyperlipidemia, unspecified: Secondary | ICD-10-CM | POA: Diagnosis not present

## 2012-04-03 DIAGNOSIS — I2510 Atherosclerotic heart disease of native coronary artery without angina pectoris: Secondary | ICD-10-CM

## 2012-04-03 DIAGNOSIS — I4891 Unspecified atrial fibrillation: Secondary | ICD-10-CM | POA: Diagnosis not present

## 2012-04-03 DIAGNOSIS — I10 Essential (primary) hypertension: Secondary | ICD-10-CM | POA: Diagnosis not present

## 2012-04-03 NOTE — Assessment & Plan Note (Signed)
Blood pressure well controlled. Continue current regimen.  

## 2012-04-03 NOTE — Patient Instructions (Addendum)
Fasting Labs at [facility] on [facility]  We will contact you in 1 year to schedule your next appointment.

## 2012-04-03 NOTE — Assessment & Plan Note (Signed)
No evidence of ischemia. Continue current regimen.   

## 2012-04-03 NOTE — Assessment & Plan Note (Signed)
Quiescent. Continue apixaban.

## 2012-04-03 NOTE — Progress Notes (Signed)
HPI:  Larry Lopez is a very pleasant 79 year old male with a history of a coronary artery disease status post redo bypass surgery in 2002 by Dr. Laneta Simmers.[name] He also has a history of hypertension and hyperlipidemia, PAF complicated by  tachy-brady syndrome with significant resting bradycardia prohibiting AV-Nodal blockade. Was hospitalized in 6/10 with rapid AF.  He saw Dr. Graciela Husbands two years ago for consideration of pacemaker but it was thought that as AF is infrequent and self-limiting and his bradycardia is asymptomatic  that we would watch it for now and avoid AV-nodal blockers due to bradycardia. If AF burden increased would then consider pacing and re-insitutuion of AV-nodal blockers.  He returns for routine f/u. He is doing great. [facility] at least 4 times a week and aerobics classes, spin bike (12 miles in 30 mins) and 30 mins on [employer] rower. He feels great.  He denies any chest pain or shortness of breath no lower extremity edema. Has not had known episode of AF since March 2012. Doing well with apixaban.    SBP usually 120-130.   Lab Results  Component Value Date   CHOL 176 03/17/2011   HDL 60.30 03/17/2011   LDLCALC 91 03/17/2011   TRIG 122.0 03/17/2011   CHOLHDL 3 03/17/2011      ROS: All systems negative except as listed in HPI, PMH and Problem List.  Past Medical History  Diagnosis Date  . CAD (coronary artery disease)   . HTN (hypertension)   . HLD (hyperlipidemia)   . Bradycardia   . Glaucoma(365)   . Atrial fibrillation   . Tachy-brady syndrome     Current Outpatient Prescriptions  Medication Sig Dispense Refill  . amLODipine (NORVASC) 2.5 MG tablet take 1 tablet by mouth once daily  90 tablet  5  . apixaban (ELIQUIS) 5 MG TABS tablet Take 1 tablet (5 mg total) by mouth 2 (two) times daily.  180 tablet  3  . aspirin 81 MG tablet Take 81 mg by mouth daily.        [name] atorvastatin (LIPITOR) 40 MG tablet TAKE 1 TABLET BY MOUTH ONCE DAILY  90 tablet  3  .  Calcium Carbonate (CALCIUM 600) 1500 MG TABS Take by mouth daily.        . chlorthalidone (HYGROTON) 25 MG tablet Take 1 tablet (25 mg total) by mouth daily.  90 tablet  3  . metoprolol tartrate (LOPRESSOR) 25 MG tablet TAKE 1 TABLET BY MOUTH AS NEEDED FOR TACHYCARDIA, AS DIRECTED  30 tablet  11  . Multiple Vitamin (MULTIVITAMIN) tablet Take 1 tablet by mouth daily.        . nitroGLYCERIN (NITROSTAT) 0.4 MG SL tablet Place 0.4 mg under the tongue every 5 (five) minutes as needed.        . pilocarpine (ISOPTO CARPINE) 1 % ophthalmic solution 1 drop 3 (three) times daily.        . TRAVATAN Z 0.004 % ophthalmic solution Place 1 drop into both eyes daily.        Current Facility-Administered Medications  Medication Dose Route Frequency Provider Last Rate Last Dose  . TDaP (BOOSTRIX) injection 0.5 mL  0.5 mL Intramuscular Once Jacques Navy, MD         PHYSICAL EXAM: Filed Vitals:   04/03/12 1451  BP: 130/70  Pulse: 61   General:  Well appearing. Thin. No resp difficulty HEENT: normal Neck: supple. JVP flat. Carotids 2+ bilaterally; no bruits. No  lymphadenopathy or thryomegaly appreciated. Cor: PMI normal. Regular rate & rhythm. No rubs, gallops or murmurs. Lungs: clear Abdomen: soft, nontender, nondistended. No hepatosplenomegaly. No bruits or masses. Good bowel sounds. Extremities: no cyanosis, clubbing, rash, edema Neuro: alert & orientedx3, cranial nerves grossly intact. Moves all 4 extremities w/o difficulty. Affect pleasant.    ECG: Sinus brady 57 No ST-T wave abnormalities.    ASSESSMENT & PLAN:

## 2012-04-03 NOTE — Assessment & Plan Note (Signed)
Typically followed by Dr. Debby [name]. However, has not had lipid panel in over a year. We will take the liberty to order for him. Goal LDL < 70. Continue statin.

## 2012-04-05 NOTE — Addendum Note (Signed)
Encounter addended by: Pleasant Bensinger, CCT on: 04/05/2012  8:41 AM<BR>     Documentation filed: Charges VN

## 2012-04-06 ENCOUNTER — Other Ambulatory Visit (INDEPENDENT_AMBULATORY_CARE_PROVIDER_SITE_OTHER): Payer: Medicare Other

## 2012-04-06 DIAGNOSIS — I2510 Atherosclerotic heart disease of native coronary artery without angina pectoris: Secondary | ICD-10-CM | POA: Diagnosis not present

## 2012-04-06 DIAGNOSIS — E785 Hyperlipidemia, unspecified: Secondary | ICD-10-CM

## 2012-04-06 LAB — BASIC METABOLIC PANEL
Calcium: 9.5 mg/dL (ref 8.4–10.5)
GFR: 83.38 mL/min (ref >60.00)
Glucose, Bld: 100 mg/dL — ABNORMAL HIGH (ref 70–99)
Potassium: 3.9 mEq/L (ref 3.5–5.1)
Sodium: 139 mEq/L (ref 135–145)

## 2012-04-06 LAB — LIPID PANEL
Cholesterol: 155 mg/dL (ref 0–200)
HDL: 60.2 mg/dL (ref >39.00)
LDL Cholesterol: 79 mg/dL (ref 0–99)
VLDL: 15.6 mg/dL (ref 0.0–40.0)

## 2012-04-06 LAB — HEPATIC FUNCTION PANEL
Albumin: 4.1 g/dL (ref 3.5–5.2)
Alkaline Phosphatase: 83 U/L (ref 39–117)
Total Bilirubin: 0.8 mg/dL (ref 0.3–1.2)

## 2012-04-14 ENCOUNTER — Encounter (HOSPITAL_COMMUNITY): Payer: Self-pay | Admitting: *Deleted

## 2012-05-03 DIAGNOSIS — H40039 Anatomical narrow angle, unspecified eye: Secondary | ICD-10-CM | POA: Diagnosis not present

## 2012-05-03 DIAGNOSIS — H4011X0 Primary open-angle glaucoma, stage unspecified: Secondary | ICD-10-CM | POA: Diagnosis not present

## 2012-05-03 DIAGNOSIS — H2510 Age-related nuclear cataract, unspecified eye: Secondary | ICD-10-CM | POA: Diagnosis not present

## 2012-05-09 ENCOUNTER — Telehealth (HOSPITAL_COMMUNITY): Payer: Self-pay | Admitting: *Deleted

## 2012-05-09 MED ORDER — HYDROCHLOROTHIAZIDE 25 MG PO TABS: 25.0000 mg | ORAL_TABLET | Freq: Every day | ORAL | Status: DC

## 2012-05-09 NOTE — Telephone Encounter (Signed)
Per pt the Chlorthalidone is going to cost him about $80 but HCTZ will be free, per Dr Gala Romney ok to change, new rx for HCTZ faxed to [facility] at [phone]

## 2012-05-11 ENCOUNTER — Other Ambulatory Visit (HOSPITAL_COMMUNITY): Payer: Self-pay | Admitting: *Deleted

## 2012-05-11 MED ORDER — ATORVASTATIN CALCIUM 40 MG PO TABS: ORAL_TABLET | ORAL | Status: DC

## 2012-05-19 ENCOUNTER — Other Ambulatory Visit (HOSPITAL_COMMUNITY): Payer: Self-pay | Admitting: *Deleted

## 2012-05-19 MED ORDER — AMLODIPINE BESYLATE 2.5 MG PO TABS: ORAL_TABLET | ORAL | Status: DC

## 2012-05-23 DIAGNOSIS — H40039 Anatomical narrow angle, unspecified eye: Secondary | ICD-10-CM | POA: Diagnosis not present

## 2012-06-02 DIAGNOSIS — H40039 Anatomical narrow angle, unspecified eye: Secondary | ICD-10-CM | POA: Diagnosis not present

## 2012-06-02 DIAGNOSIS — H4011X0 Primary open-angle glaucoma, stage unspecified: Secondary | ICD-10-CM | POA: Diagnosis not present

## 2012-06-02 DIAGNOSIS — H2510 Age-related nuclear cataract, unspecified eye: Secondary | ICD-10-CM | POA: Diagnosis not present

## 2012-06-19 ENCOUNTER — Other Ambulatory Visit (HOSPITAL_COMMUNITY): Payer: Self-pay | Admitting: *Deleted

## 2012-06-19 MED ORDER — METOPROLOL TARTRATE 25 MG PO TABS: ORAL_TABLET | ORAL | Status: DC

## 2012-07-02 ENCOUNTER — Emergency Department (HOSPITAL_COMMUNITY): Payer: Medicare Other

## 2012-07-02 ENCOUNTER — Emergency Department (HOSPITAL_COMMUNITY)
Admission: EM | Admit: 2012-07-02 | Discharge: 2012-07-03 | Disposition: A | Discharge status: Home / Self Care | Payer: Medicare Other | Attending: Emergency Medicine | Admitting: Emergency Medicine

## 2012-07-02 DIAGNOSIS — S4980XA Other specified injuries of shoulder and upper arm, unspecified arm, initial encounter: Secondary | ICD-10-CM | POA: Diagnosis not present

## 2012-07-02 DIAGNOSIS — S61409A Unspecified open wound of unspecified hand, initial encounter: Secondary | ICD-10-CM | POA: Diagnosis not present

## 2012-07-02 DIAGNOSIS — W1809XA Striking against other object with subsequent fall, initial encounter: Secondary | ICD-10-CM | POA: Insufficient documentation

## 2012-07-02 DIAGNOSIS — I2510 Atherosclerotic heart disease of native coronary artery without angina pectoris: Secondary | ICD-10-CM | POA: Diagnosis not present

## 2012-07-02 DIAGNOSIS — S199XXA Unspecified injury of neck, initial encounter: Secondary | ICD-10-CM | POA: Diagnosis not present

## 2012-07-02 DIAGNOSIS — S0511XA Contusion of eyeball and orbital tissues, right eye, initial encounter: Secondary | ICD-10-CM

## 2012-07-02 DIAGNOSIS — H409 Unspecified glaucoma: Secondary | ICD-10-CM | POA: Insufficient documentation

## 2012-07-02 DIAGNOSIS — Z951 Presence of aortocoronary bypass graft: Secondary | ICD-10-CM | POA: Diagnosis not present

## 2012-07-02 DIAGNOSIS — Z7982 Long term (current) use of aspirin: Secondary | ICD-10-CM | POA: Diagnosis not present

## 2012-07-02 DIAGNOSIS — G319 Degenerative disease of nervous system, unspecified: Secondary | ICD-10-CM | POA: Insufficient documentation

## 2012-07-02 DIAGNOSIS — S058X9A Other injuries of unspecified eye and orbit, initial encounter: Secondary | ICD-10-CM | POA: Diagnosis not present

## 2012-07-02 DIAGNOSIS — W108XXA Fall (on) (from) other stairs and steps, initial encounter: Secondary | ICD-10-CM | POA: Insufficient documentation

## 2012-07-02 DIAGNOSIS — Z87891 Personal history of nicotine dependence: Secondary | ICD-10-CM | POA: Insufficient documentation

## 2012-07-02 DIAGNOSIS — S0083XA Contusion of other part of head, initial encounter: Secondary | ICD-10-CM | POA: Diagnosis not present

## 2012-07-02 DIAGNOSIS — Z8679 Personal history of other diseases of the circulatory system: Secondary | ICD-10-CM | POA: Diagnosis not present

## 2012-07-02 DIAGNOSIS — Y929 Unspecified place or not applicable: Secondary | ICD-10-CM | POA: Insufficient documentation

## 2012-07-02 DIAGNOSIS — S46909A Unspecified injury of unspecified muscle, fascia and tendon at shoulder and upper arm level, unspecified arm, initial encounter: Secondary | ICD-10-CM | POA: Insufficient documentation

## 2012-07-02 DIAGNOSIS — S01409A Unspecified open wound of unspecified cheek and temporomandibular area, initial encounter: Secondary | ICD-10-CM | POA: Diagnosis not present

## 2012-07-02 DIAGNOSIS — I10 Essential (primary) hypertension: Secondary | ICD-10-CM | POA: Insufficient documentation

## 2012-07-02 DIAGNOSIS — E785 Hyperlipidemia, unspecified: Secondary | ICD-10-CM | POA: Diagnosis not present

## 2012-07-02 DIAGNOSIS — S0510XA Contusion of eyeball and orbital tissues, unspecified eye, initial encounter: Secondary | ICD-10-CM | POA: Diagnosis not present

## 2012-07-02 DIAGNOSIS — S51809A Unspecified open wound of unspecified forearm, initial encounter: Secondary | ICD-10-CM | POA: Diagnosis not present

## 2012-07-02 DIAGNOSIS — W19XXXA Unspecified fall, initial encounter: Secondary | ICD-10-CM

## 2012-07-02 DIAGNOSIS — Y939 Activity, unspecified: Secondary | ICD-10-CM | POA: Insufficient documentation

## 2012-07-02 DIAGNOSIS — Z79899 Other long term (current) drug therapy: Secondary | ICD-10-CM | POA: Diagnosis not present

## 2012-07-02 DIAGNOSIS — S0993XA Unspecified injury of face, initial encounter: Secondary | ICD-10-CM | POA: Insufficient documentation

## 2012-07-02 DIAGNOSIS — S0990XA Unspecified injury of head, initial encounter: Secondary | ICD-10-CM | POA: Diagnosis not present

## 2012-07-02 DIAGNOSIS — S0003XA Contusion of scalp, initial encounter: Secondary | ICD-10-CM | POA: Diagnosis not present

## 2012-07-02 LAB — BASIC METABOLIC PANEL
Calcium: 9.1 mg/dL (ref 8.4–10.5)
GFR calc Af Amer: 87 mL/min — ABNORMAL LOW (ref >90)
GFR calc non Af Amer: 75 mL/min — ABNORMAL LOW (ref >90)
Sodium: 141 mEq/L (ref 135–145)

## 2012-07-02 LAB — CBC WITH DIFFERENTIAL/PLATELET
Basophils Absolute: 0.1 10*3/uL (ref 0.0–0.1)
Eosinophils Absolute: 0.2 10*3/uL (ref 0.0–0.7)
Eosinophils Relative: 2 % (ref 0–5)
MCH: 31.4 pg (ref 26.0–34.0)
MCV: 89.8 fL (ref 78.0–100.0)
Neutrophils Relative %: 60 % (ref 43–77)
Platelets: 190 10*3/uL (ref 150–400)
RDW: 14.3 % (ref 11.5–15.5)
WBC: 7.7 10*3/uL (ref 4.0–10.5)

## 2012-07-02 LAB — PROTIME-INR
INR: 0.96 (ref 0.00–1.49)
Prothrombin Time: 12.7 seconds (ref 11.6–15.2)

## 2012-07-02 NOTE — ED Notes (Signed)
Pt waiting to go to ct

## 2012-07-02 NOTE — ED Notes (Signed)
To ct now.

## 2012-07-02 NOTE — ED Notes (Signed)
The pt returned from xray 

## 2012-07-02 NOTE — ED Notes (Addendum)
Pt tripped and landed on his shoulder and right side of his face, right hand swollen and pain. Pt takes elloquist at home.  Pt is alert and oriented.

## 2012-07-02 NOTE — ED Notes (Signed)
No active bkeeding rt cheek wound

## 2012-07-02 NOTE — ED Notes (Signed)
The pt was  Brought back from triage after he fell striking his face on the ground after he tripped and fell striking his rt face on the  Ground.  No loc swelling and lac rt cheek.  Moves all extremities etc.  Family at the bedside

## 2012-07-02 NOTE — ED Notes (Signed)
Ice pack place on the pts face.  He is getting very agitated about waiting for the c-t scan his rt eye is getting more swollen

## 2012-07-02 NOTE — ED Notes (Signed)
rts 12

## 2012-07-02 NOTE — ED Notes (Signed)
Family at beside. Family given emotional support. 

## 2012-07-02 NOTE — Progress Notes (Signed)
Orthopedic Tech Progress Note Patient Details:  Larry Lopez 28-Jul-1926 [id number]  Patient ID: Larry Lopez, male   DOB: 12/02/1926, 79 y.o.   MRN: [id number] Made level 2 trauma visit  Nikki Dom 07/02/2012, 9:38 PM

## 2012-07-02 NOTE — ED Provider Notes (Signed)
History     CSN: [id number]  Arrival date & time 07/02/12  2108   First MD Initiated Contact with Patient 07/02/12 2133      Chief Complaint  Patient presents with  . Fall    (Consider location/radiation/quality/duration/timing/severity/associated sxs/prior treatment) Patient is a 79 y.o. male presenting with fall.  Fall This is a new problem. The current episode started today. Episode frequency: once. Pertinent negatives include no abdominal pain, arthralgias, chest pain, chills, congestion, coughing, fever, headaches, nausea, numbness, rash, sore throat, vertigo, vomiting or weakness. Nothing aggravates the symptoms. He has tried nothing for the symptoms.    Past Medical History  Diagnosis Date  . CAD (coronary artery disease)   . HTN (hypertension)   . HLD (hyperlipidemia)   . Bradycardia   . Glaucoma(365)   . Atrial fibrillation   . Tachy-brady syndrome     Past Surgical History  Procedure Laterality Date  . Tonsillectomy and adenoidectomy    . Coronary artery bypass graft      Family History  Problem Relation Age of Onset  . Cancer    . Diabetes    . Coronary artery disease      History  Substance Use Topics  . Smoking status: Former Smoker    Quit date: 01/12/1967  . Smokeless tobacco: Never Used  . Alcohol Use: 14.0 oz/week    28 drink(s) per week      Review of Systems  Constitutional: Negative for fever and chills.  HENT: Negative for congestion, sore throat and rhinorrhea.   Eyes: Negative for photophobia and visual disturbance.  Respiratory: Negative for cough and shortness of breath.   Cardiovascular: Negative for chest pain and leg swelling.  Gastrointestinal: Negative for nausea, vomiting, abdominal pain, diarrhea and constipation.  Endocrine: Negative for polydipsia and polyuria.  Genitourinary: Negative for dysuria and hematuria.  Musculoskeletal: Negative for back pain and arthralgias.  Skin: Negative for color change and rash.    Neurological: Negative for dizziness, vertigo, syncope, weakness, light-headedness, numbness and headaches.  Hematological: Negative for adenopathy. Does not bruise/bleed easily.  All other systems reviewed and are negative.    Allergies  Review of patient's allergies indicates no known allergies.  Home Medications   Current Outpatient Rx  Name  Route  Sig  Dispense  Refill  . amLODipine (NORVASC) 2.5 MG tablet      take 1 tablet by mouth once daily   90 tablet   5   . apixaban (ELIQUIS) 5 MG TABS tablet   Oral   Take 5 mg by mouth 2 (two) times daily.         [name] aspirin 81 MG tablet   Oral   Take 81 mg by mouth daily.           [name] atorvastatin (LIPITOR) 40 MG tablet   Oral   Take 40 mg by mouth daily.         . Calcium Carbonate (CALCIUM 600) 1500 MG TABS   Oral   Take 1 tablet by mouth daily.          . chlorthalidone (HYGROTON) 25 MG tablet   Oral   Take 25 mg by mouth daily.         . metoprolol tartrate (LOPRESSOR) 25 MG tablet   Oral   Take 25 mg by mouth as needed. Only uses if needed for tachycardia. Has not needed in over a year.         . Multiple Vitamin (MULTIVITAMIN)  tablet   Oral   Take 1 tablet by mouth daily.          . pilocarpine (ISOPTO CARPINE) 1 % ophthalmic solution   Both Eyes   Place 1 drop into both eyes 3 (three) times daily.          . TRAVATAN Z 0.004 % ophthalmic solution   Both Eyes   Place 1 drop into both eyes daily.          . metoprolol tartrate (LOPRESSOR) 25 MG tablet      TAKE 1 TABLET BY MOUTH AS NEEDED FOR TACHYCARDIA, AS DIRECTED   90 tablet   3   . nitroGLYCERIN (NITROSTAT) 0.4 MG SL tablet   Sublingual   Place 0.4 mg under the tongue every 5 (five) minutes as needed for chest pain.            BP 130/60  Pulse 52  Temp(Src) 98.4 F (36.9 C) (Oral)  Resp 18  Ht 5\' 10"  (1.778 m)  Wt 155 lb (70.308 kg)  BMI 22.24 kg/m2  SpO2 98%  Physical Exam  Vitals reviewed. Constitutional: He  is oriented to person, place, and time. He appears well-developed and well-nourished.  HENT:  Head: Normocephalic. Head is with contusion. Head is without raccoon's eyes.    Eyes: Conjunctivae and EOM are normal.  Neck: Normal range of motion. Neck supple.  Cardiovascular: Normal rate, regular rhythm and normal heart sounds.   Pulmonary/Chest: Effort normal and breath sounds normal. No respiratory distress.  Abdominal: He exhibits no distension. There is no tenderness. There is no rebound and no guarding.  Musculoskeletal: Normal range of motion.       Right hand: He exhibits no tenderness (no snuffbox tenderness).       Hands: Neurological: He is alert and oriented to person, place, and time.  Skin: Skin is warm and dry.    ED Course  Procedures (including critical care time)  Labs Reviewed  BASIC METABOLIC PANEL - Abnormal; Notable for the following:    Glucose, Bld 113 (*)    GFR calc non Af Amer 75 (*)    GFR calc Af Amer 87 (*)    All other components within normal limits  CBC WITH DIFFERENTIAL  PROTIME-INR   Results for orders placed during the hospital encounter of 07/02/12  CBC WITH DIFFERENTIAL      Result Value Range   WBC 7.7  4.0 - 10.5 K/uL   RBC 4.91  4.22 - 5.81 MIL/uL   Hemoglobin 15.4  13.0 - 17.0 g/dL   HCT 69.6  29.5 - 28.4 %   MCV 89.8  78.0 - 100.0 fL   MCH 31.4  26.0 - 34.0 pg   MCHC 34.9  30.0 - 36.0 g/dL   RDW 13.2  44.0 - 10.2 %   Platelets 190  150 - 400 K/uL   Neutrophils Relative % 60  43 - 77 %   Neutro Abs 4.6  1.7 - 7.7 K/uL   Lymphocytes Relative 27  12 - 46 %   Lymphs Abs 2.0  0.7 - 4.0 K/uL   Monocytes Relative 11  3 - 12 %   Monocytes Absolute 0.8  0.1 - 1.0 K/uL   Eosinophils Relative 2  0 - 5 %   Eosinophils Absolute 0.2  0.0 - 0.7 K/uL   Basophils Relative 1  0 - 1 %   Basophils Absolute 0.1  0.0 - 0.1 K/uL  BASIC METABOLIC PANEL  Result Value Range   Sodium 141  135 - 145 mEq/L   Potassium 3.6  3.5 - 5.1 mEq/L    Chloride 104  96 - 112 mEq/L   CO2 28  19 - 32 mEq/L   Glucose, Bld 113 (*) 70 - 99 mg/dL   BUN 18  6 - 23 mg/dL   Creatinine, Ser 1.61  0.50 - 1.35 mg/dL   Calcium 9.1  8.4 - 09.6 mg/dL   GFR calc non Af Amer 75 (*) >90 mL/min   GFR calc Af Amer 87 (*) >90 mL/min  PROTIME-INR      Result Value Range   Prothrombin Time 12.7  11.6 - 15.2 seconds   INR 0.96  0.00 - 1.49    Dg Forearm Right  07/03/2012   *RADIOLOGY REPORT*  Clinical Data: Fall.  Pain, lacerations.  RIGHT FOREARM - 2 VIEW  Comparison: None.  Findings: No acute bony abnormality.  Specifically, no fracture, subluxation, or dislocation.  Soft tissues are intact. Joint spaces are maintained.  Normal bone mineralization.  No radiopaque foreign bodies.  IMPRESSION: No acute bony abnormality.   Original Report Authenticated By: Charlett Nose, M.D.   Ct Head Wo Contrast  07/02/2012   *RADIOLOGY REPORT*  Clinical Data:  Fall.  Head injury  CT HEAD WITHOUT CONTRAST CT MAXILLOFACIAL WITHOUT CONTRAST CT CERVICAL SPINE WITHOUT CONTRAST  Technique:  Multidetector CT imaging of the head, cervical spine, and maxillofacial structures were performed using the standard protocol without intravenous contrast. Multiplanar CT image reconstructions of the cervical spine and maxillofacial structures were also generated.  Comparison:  MRI head 12/15/2006  CT HEAD  Findings: Moderate atrophy and chronic microvascular ischemic change.  No acute infarct.  Negative for hemorrhage or mass.  No subdural fluid collection.  Soft tissue swelling over the right eye.  Negative for skull fracture.  IMPRESSION: Moderate atrophy and chronic microvascular ischemia.  No acute abnormality.  CT MAXILLOFACIAL  Findings:  Soft tissue swelling over the right eye and maxilla.  Negative for facial fracture.  No orbital fracture is identified. Nasal bone is intact.  No fracture of the mandible.  Mild mucosal edema in the paranasal sinuses.  No air-fluid level.  IMPRESSION: Negative for  facial fracture.  CT CERVICAL SPINE  Findings:   Negative for fracture.  Disc degeneration and spondylosis throughout the cervical spine, most prominent C5-6 and C6-7.  Mild facet degeneration.  Sclerotic lesion C3 vertebral body appears benign. Atherosclerotic calcification carotid artery bilaterally.  IMPRESSION: Negative for fracture.   Original Report Authenticated By: Janeece Riggers, M.D.   Ct Cervical Spine Wo Contrast  07/02/2012   *RADIOLOGY REPORT*  Clinical Data:  Fall.  Head injury  CT HEAD WITHOUT CONTRAST CT MAXILLOFACIAL WITHOUT CONTRAST CT CERVICAL SPINE WITHOUT CONTRAST  Technique:  Multidetector CT imaging of the head, cervical spine, and maxillofacial structures were performed using the standard protocol without intravenous contrast. Multiplanar CT image reconstructions of the cervical spine and maxillofacial structures were also generated.  Comparison:  MRI head 12/15/2006  CT HEAD  Findings: Moderate atrophy and chronic microvascular ischemic change.  No acute infarct.  Negative for hemorrhage or mass.  No subdural fluid collection.  Soft tissue swelling over the right eye.  Negative for skull fracture.  IMPRESSION: Moderate atrophy and chronic microvascular ischemia.  No acute abnormality.  CT MAXILLOFACIAL  Findings:  Soft tissue swelling over the right eye and maxilla.  Negative for facial fracture.  No orbital fracture is identified. Nasal bone is  intact.  No fracture of the mandible.  Mild mucosal edema in the paranasal sinuses.  No air-fluid level.  IMPRESSION: Negative for facial fracture.  CT CERVICAL SPINE  Findings:   Negative for fracture.  Disc degeneration and spondylosis throughout the cervical spine, most prominent C5-6 and C6-7.  Mild facet degeneration.  Sclerotic lesion C3 vertebral body appears benign. Atherosclerotic calcification carotid artery bilaterally.  IMPRESSION: Negative for fracture.   Original Report Authenticated By: Janeece Riggers, M.D.   Dg Hand 2 View  Right  07/03/2012   *RADIOLOGY REPORT*  Clinical Data: Fall.  Right hand and forearm lacerations, pain.  RIGHT HAND - 2 VIEW  Comparison: None.  Findings: No acute bony abnormality.  Specifically, no fracture, subluxation, or dislocation.  Soft tissues are intact. Joint spaces are maintained.  Normal bone mineralization.  No radiopaque foreign bodies.  IMPRESSION: No acute bony abnormality.   Original Report Authenticated By: Charlett Nose, M.D.   Ct Maxillofacial Wo Cm  07/02/2012   *RADIOLOGY REPORT*  Clinical Data:  Fall.  Head injury  CT HEAD WITHOUT CONTRAST CT MAXILLOFACIAL WITHOUT CONTRAST CT CERVICAL SPINE WITHOUT CONTRAST  Technique:  Multidetector CT imaging of the head, cervical spine, and maxillofacial structures were performed using the standard protocol without intravenous contrast. Multiplanar CT image reconstructions of the cervical spine and maxillofacial structures were also generated.  Comparison:  MRI head 12/15/2006  CT HEAD  Findings: Moderate atrophy and chronic microvascular ischemic change.  No acute infarct.  Negative for hemorrhage or mass.  No subdural fluid collection.  Soft tissue swelling over the right eye.  Negative for skull fracture.  IMPRESSION: Moderate atrophy and chronic microvascular ischemia.  No acute abnormality.  CT MAXILLOFACIAL  Findings:  Soft tissue swelling over the right eye and maxilla.  Negative for facial fracture.  No orbital fracture is identified. Nasal bone is intact.  No fracture of the mandible.  Mild mucosal edema in the paranasal sinuses.  No air-fluid level.  IMPRESSION: Negative for facial fracture.  CT CERVICAL SPINE  Findings:   Negative for fracture.  Disc degeneration and spondylosis throughout the cervical spine, most prominent C5-6 and C6-7.  Mild facet degeneration.  Sclerotic lesion C3 vertebral body appears benign. Atherosclerotic calcification carotid artery bilaterally.  IMPRESSION: Negative for fracture.   Original Report Authenticated By:  Janeece Riggers, M.D.     1. Fall, initial encounter   2. Periorbital contusion of right eye, initial encounter       MDM  79 y.o. male  with pertinent PMH of CAD on apixaban, HTN presents with facial pain after fall down approximately 2 steps.  No LOC, pt ambulatory afterwards.  Physical exam as above with contusions, no signs of ocular entrapment, NV intact throughout.  Pt with only minimal pain.  Imaging returned negative as above.  As pt has no ha, no systemic signs, and was monitored without change, feel him stable to dc home with pcp fu and strict return precautions for any worsening ha, nausea, vomiting.  Family voiced understanding of precautions and agreed to fu.   Labs and imaging as above reviewed by myself and attending,Dr. Ranae Palms, with whom case was discussed.   1. Fall, initial encounter   2. Periorbital contusion of right eye, initial encounter             Noel Gerold, MD 07/03/12 [phone]

## 2012-07-03 ENCOUNTER — Emergency Department (HOSPITAL_COMMUNITY)
Admission: EM | Admit: 2012-07-03 | Discharge: 2012-07-03 | Disposition: A | Discharge status: Home / Self Care | Payer: Medicare Other | Source: Home / Self Care | Attending: Emergency Medicine | Admitting: Emergency Medicine

## 2012-07-03 ENCOUNTER — Encounter (HOSPITAL_COMMUNITY): Payer: Self-pay | Admitting: Nurse Practitioner

## 2012-07-03 ENCOUNTER — Emergency Department (HOSPITAL_COMMUNITY): Payer: Medicare Other

## 2012-07-03 ENCOUNTER — Telehealth: Payer: Self-pay | Admitting: Internal Medicine

## 2012-07-03 DIAGNOSIS — Z8679 Personal history of other diseases of the circulatory system: Secondary | ICD-10-CM | POA: Insufficient documentation

## 2012-07-03 DIAGNOSIS — I2510 Atherosclerotic heart disease of native coronary artery without angina pectoris: Secondary | ICD-10-CM | POA: Insufficient documentation

## 2012-07-03 DIAGNOSIS — E785 Hyperlipidemia, unspecified: Secondary | ICD-10-CM | POA: Insufficient documentation

## 2012-07-03 DIAGNOSIS — S1093XA Contusion of unspecified part of neck, initial encounter: Secondary | ICD-10-CM | POA: Diagnosis not present

## 2012-07-03 DIAGNOSIS — S0003XA Contusion of scalp, initial encounter: Secondary | ICD-10-CM | POA: Insufficient documentation

## 2012-07-03 DIAGNOSIS — I4891 Unspecified atrial fibrillation: Secondary | ICD-10-CM | POA: Insufficient documentation

## 2012-07-03 DIAGNOSIS — Y9389 Activity, other specified: Secondary | ICD-10-CM | POA: Insufficient documentation

## 2012-07-03 DIAGNOSIS — Z8669 Personal history of other diseases of the nervous system and sense organs: Secondary | ICD-10-CM | POA: Insufficient documentation

## 2012-07-03 DIAGNOSIS — S0083XD Contusion of other part of head, subsequent encounter: Secondary | ICD-10-CM

## 2012-07-03 DIAGNOSIS — Z79899 Other long term (current) drug therapy: Secondary | ICD-10-CM | POA: Insufficient documentation

## 2012-07-03 DIAGNOSIS — S51809A Unspecified open wound of unspecified forearm, initial encounter: Secondary | ICD-10-CM | POA: Diagnosis not present

## 2012-07-03 DIAGNOSIS — Z951 Presence of aortocoronary bypass graft: Secondary | ICD-10-CM | POA: Insufficient documentation

## 2012-07-03 DIAGNOSIS — IMO0002 Reserved for concepts with insufficient information to code with codable children: Secondary | ICD-10-CM | POA: Insufficient documentation

## 2012-07-03 DIAGNOSIS — Y929 Unspecified place or not applicable: Secondary | ICD-10-CM | POA: Insufficient documentation

## 2012-07-03 DIAGNOSIS — Z87891 Personal history of nicotine dependence: Secondary | ICD-10-CM | POA: Insufficient documentation

## 2012-07-03 DIAGNOSIS — Z7982 Long term (current) use of aspirin: Secondary | ICD-10-CM | POA: Insufficient documentation

## 2012-07-03 DIAGNOSIS — Z9889 Other specified postprocedural states: Secondary | ICD-10-CM | POA: Insufficient documentation

## 2012-07-03 DIAGNOSIS — S0081XA Abrasion of other part of head, initial encounter: Secondary | ICD-10-CM

## 2012-07-03 DIAGNOSIS — S61409A Unspecified open wound of unspecified hand, initial encounter: Secondary | ICD-10-CM | POA: Diagnosis not present

## 2012-07-03 DIAGNOSIS — R296 Repeated falls: Secondary | ICD-10-CM | POA: Insufficient documentation

## 2012-07-03 DIAGNOSIS — S058X9A Other injuries of unspecified eye and orbit, initial encounter: Secondary | ICD-10-CM | POA: Diagnosis not present

## 2012-07-03 DIAGNOSIS — I10 Essential (primary) hypertension: Secondary | ICD-10-CM | POA: Insufficient documentation

## 2012-07-03 DIAGNOSIS — H1130 Conjunctival hemorrhage, unspecified eye: Secondary | ICD-10-CM | POA: Insufficient documentation

## 2012-07-03 DIAGNOSIS — S01409A Unspecified open wound of unspecified cheek and temporomandibular area, initial encounter: Secondary | ICD-10-CM | POA: Diagnosis not present

## 2012-07-03 MED ORDER — SILVER NITRATE-POT NITRATE 75-25 % EX MISC
1.0000 | Freq: Once | CUTANEOUS | Status: DC
  Filled 2012-07-03: qty 1

## 2012-07-03 NOTE — ED Notes (Signed)
Lacerations cleaned with shurclens.  Noted swelling to the rt hand palmar surface and he has abrasion to the rt forearm and both knees

## 2012-07-03 NOTE — Telephone Encounter (Signed)
Patient Information:  Caller Name: [name]  Phone: [phone]  Patient: Larry, Lopez  Gender: Male  DOB: 08/10/1927  Age: 79 Years  PCP: Illene Regulus (Adults only)  Office Follow Up:  Does the office need to follow up with this patient?: No  Instructions For The Office: N/A  RN Note:  Call after 4 pm, sending to [facility] ER.  Symptoms  Reason For Call & Symptoms: Rushing to help family member about 8 pm yesterday 07/02/2012, fell down stairs, injury to Right side of face, nose and eye, injury to knees, hand and head.  Was bleeding from face and when applied pressure then bleeding stopped.  Since being home bleeding continues.  Bleeding multiple times during the night and all day today.  When applies pressure bleeding will stop.  When pressure stops bleeding starts again.   Blood all over multiple sheets, pillow cases, just can't stop the bleeding  Reviewed Health History In EMR: Yes  Reviewed Medications In EMR: Yes  Reviewed Allergies In EMR: Yes  Reviewed Surgeries / Procedures: Yes  Date of Onset of Symptoms: 07/02/2012  Treatments Tried: pressure only helps while applied  Treatments Tried Worked: No  Guideline(s) Used:  Skin Injury  Disposition Per Guideline:   Go to ED Now (or to Office with PCP Approval)  Reason For Disposition Reached:   Bleeding won't stop after 10 minutes of direct pressure (using correct technique)  Advice Given:  N/A  Patient Will Follow Care Advice:  YES

## 2012-07-03 NOTE — ED Provider Notes (Signed)
History  This chart was scribed for non-physician practitioner, Dierdre Forth PA-C, working with Flint Melter, MD by Ardeen Jourdain, ED Scribe. This patient was seen in room TR05C/TR05C and the patient's care was started at 1923.  CSN: [id number] Arrival date & time 07/03/12  1633   First MD Initiated Contact with Patient 07/03/12 1923     Chief Complaint  Patient presents with  . Coagulation Disorder    The history is provided by the patient. No language interpreter was used.    HPI Comments: Larry Lopez is a 79 y.o. male who presents to the Emergency Department complaining of a continually bleeding abrasion from a fall that occurred last night. Pt was evaluated here last night after the fall. He was discharged home with no intercranial bleeding or facial fractures. Pts wife states the abrasion to his right cheek has continued to ooze blood throughout the night. She states she was unable to get the bleeding stopped at home. She states she could intermittently stop the bleeding with pressure, but it would always return. He reports using ice on the area with slight relief, but without hemostasis. Pt is currently taking blood thinners.   Past Medical History  Diagnosis Date  . CAD (coronary artery disease)   . HTN (hypertension)   . HLD (hyperlipidemia)   . Bradycardia   . Glaucoma   . Atrial fibrillation   . Tachy-brady syndrome    Past Surgical History  Procedure Laterality Date  . Tonsillectomy and adenoidectomy    . Coronary artery bypass graft    . Cardiac surgery     Family History  Problem Relation Age of Onset  . Cancer    . Diabetes    . Coronary artery disease     History  Substance Use Topics  . Smoking status: Former Smoker    Quit date: 01/12/1967  . Smokeless tobacco: Never Used  . Alcohol Use: 14.0 oz/week    28 drink(s) per week    Review of Systems  Skin: Positive for wound.  All other systems reviewed and are negative.    Allergies   Review of patient's allergies indicates no known allergies.  Home Medications   Current Outpatient Rx  Name  Route  Sig  Dispense  Refill  . amLODipine (NORVASC) 2.5 MG tablet      take 1 tablet by mouth once daily   90 tablet   5   . apixaban (ELIQUIS) 5 MG TABS tablet   Oral   Take 5 mg by mouth 2 (two) times daily.         [name] aspirin 81 MG tablet   Oral   Take 81 mg by mouth daily.           [name] atorvastatin (LIPITOR) 40 MG tablet   Oral   Take 40 mg by mouth daily.         . Calcium Carbonate (CALCIUM 600) 1500 MG TABS   Oral   Take 1 tablet by mouth daily.          . chlorthalidone (HYGROTON) 25 MG tablet   Oral   Take 25 mg by mouth daily.         . metoprolol tartrate (LOPRESSOR) 25 MG tablet   Oral   Take 25 mg by mouth as needed. Only uses if needed for tachycardia. Has not needed in over a year.         . Multiple Vitamin (MULTIVITAMIN) tablet   Oral  Take 1 tablet by mouth daily.          . nitroGLYCERIN (NITROSTAT) 0.4 MG SL tablet   Sublingual   Place 0.4 mg under the tongue every 5 (five) minutes as needed for chest pain.          . pilocarpine (ISOPTO CARPINE) 1 % ophthalmic solution   Both Eyes   Place 1 drop into both eyes 3 (three) times daily.          . TRAVATAN Z 0.004 % ophthalmic solution   Both Eyes   Place 1 drop into both eyes daily.           Triage Vitals: BP 159/70  Pulse 65  Temp(Src) 98.3 F (36.8 C) (Oral)  Resp 16  Ht 5\' 11"  (1.803 m)  Wt 155 lb (70.308 kg)  BMI 21.63 kg/m2  SpO2 96%  Physical Exam  Nursing note and vitals reviewed. Constitutional: He is oriented to person, place, and time. He appears well-developed and well-nourished. No distress.  HENT:  Head: Normocephalic and atraumatic.  Mouth/Throat: Oropharynx is clear and moist. No oropharyngeal exudate.  Significant swelling and ecchymosis of right eye, cheek and nose. 1.5 cm open and bleeding laceration inferior to the right orbit.  Abrasion/skin tear surrounding laceration   Eyes: Pupils are equal, round, and reactive to light. Right conjunctiva has a hemorrhage. Left conjunctiva is not injected. Left conjunctiva has no hemorrhage. No scleral icterus. Right eye exhibits normal extraocular motion. Left eye exhibits normal extraocular motion.  Significant subconjunctival hemorrhage on exam  Neck: Normal range of motion and full passive range of motion without pain. No spinous process tenderness and no muscular tenderness present. No rigidity. Normal range of motion present.  Cardiovascular: Normal rate, regular rhythm, normal heart sounds and intact distal pulses.  Exam reveals no gallop and no friction rub.   No murmur heard. Pulmonary/Chest: Effort normal and breath sounds normal. No respiratory distress. He has no wheezes.  Musculoskeletal: Normal range of motion. He exhibits no edema.  Tenderness to the contusion of the face  Neurological: He is alert and oriented to person, place, and time. He exhibits normal muscle tone. Coordination normal.  Speech is clear and goal oriented Moves extremities without ataxia  Skin: Skin is warm and dry. He is not diaphoretic.  Psychiatric: He has a normal mood and affect.    ED Course  LACERATION REPAIR Date/Time: 07/03/2012 8:00 PM Performed by: Dierdre Forth Authorized by: Dierdre Forth Consent: Verbal consent obtained. Risks and benefits: risks, benefits and alternatives were discussed Consent given by: patient Patient understanding: patient states understanding of the procedure being performed Patient consent: the patient's understanding of the procedure matches consent given Procedure consent: procedure consent matches procedure scheduled Relevant documents: relevant documents present and verified Site marked: the operative site was marked Imaging studies: imaging studies available Required items: required blood products, implants, devices, and special  equipment available Patient identity confirmed: verbally with patient and arm band Time out: Immediately prior to procedure a "time out" was called to verify the correct patient, procedure, equipment, support staff and site/side marked as required. Body area: head/neck Location details: right cheek Laceration length: 2.5 cm Patient sedated: no Irrigation solution: saline Irrigation method: syringe Amount of cleaning: extensive Debridement: minimal Wound skin closure material used: silver nitrate.   (including critical care time)  DIAGNOSTIC STUDIES: Oxygen Saturation is 96% on room air, normal by my interpretation.    COORDINATION OF CARE:  7:42 PM-Discussed treatment plan which  includes quick clot and instructions for home care with pt at bedside and pt agreed to plan.   Labs Reviewed - No data to display Dg Forearm Right  07/03/2012   *RADIOLOGY REPORT*  Clinical Data: Fall.  Pain, lacerations.  RIGHT FOREARM - 2 VIEW  Comparison: None.  Findings: No acute bony abnormality.  Specifically, no fracture, subluxation, or dislocation.  Soft tissues are intact. Joint spaces are maintained.  Normal bone mineralization.  No radiopaque foreign bodies.  IMPRESSION: No acute bony abnormality.   Original Report Authenticated By: Charlett Nose, M.D.   Ct Head Wo Contrast  07/02/2012   *RADIOLOGY REPORT*  Clinical Data:  Fall.  Head injury  CT HEAD WITHOUT CONTRAST CT MAXILLOFACIAL WITHOUT CONTRAST CT CERVICAL SPINE WITHOUT CONTRAST  Technique:  Multidetector CT imaging of the head, cervical spine, and maxillofacial structures were performed using the standard protocol without intravenous contrast. Multiplanar CT image reconstructions of the cervical spine and maxillofacial structures were also generated.  Comparison:  MRI head 12/15/2006  CT HEAD  Findings: Moderate atrophy and chronic microvascular ischemic change.  No acute infarct.  Negative for hemorrhage or mass.  No subdural fluid collection.   Soft tissue swelling over the right eye.  Negative for skull fracture.  IMPRESSION: Moderate atrophy and chronic microvascular ischemia.  No acute abnormality.  CT MAXILLOFACIAL  Findings:  Soft tissue swelling over the right eye and maxilla.  Negative for facial fracture.  No orbital fracture is identified. Nasal bone is intact.  No fracture of the mandible.  Mild mucosal edema in the paranasal sinuses.  No air-fluid level.  IMPRESSION: Negative for facial fracture.  CT CERVICAL SPINE  Findings:   Negative for fracture.  Disc degeneration and spondylosis throughout the cervical spine, most prominent C5-6 and C6-7.  Mild facet degeneration.  Sclerotic lesion C3 vertebral body appears benign. Atherosclerotic calcification carotid artery bilaterally.  IMPRESSION: Negative for fracture.   Original Report Authenticated By: Janeece Riggers, M.D.   Ct Cervical Spine Wo Contrast  07/02/2012   *RADIOLOGY REPORT*  Clinical Data:  Fall.  Head injury  CT HEAD WITHOUT CONTRAST CT MAXILLOFACIAL WITHOUT CONTRAST CT CERVICAL SPINE WITHOUT CONTRAST  Technique:  Multidetector CT imaging of the head, cervical spine, and maxillofacial structures were performed using the standard protocol without intravenous contrast. Multiplanar CT image reconstructions of the cervical spine and maxillofacial structures were also generated.  Comparison:  MRI head 12/15/2006  CT HEAD  Findings: Moderate atrophy and chronic microvascular ischemic change.  No acute infarct.  Negative for hemorrhage or mass.  No subdural fluid collection.  Soft tissue swelling over the right eye.  Negative for skull fracture.  IMPRESSION: Moderate atrophy and chronic microvascular ischemia.  No acute abnormality.  CT MAXILLOFACIAL  Findings:  Soft tissue swelling over the right eye and maxilla.  Negative for facial fracture.  No orbital fracture is identified. Nasal bone is intact.  No fracture of the mandible.  Mild mucosal edema in the paranasal sinuses.  No air-fluid  level.  IMPRESSION: Negative for facial fracture.  CT CERVICAL SPINE  Findings:   Negative for fracture.  Disc degeneration and spondylosis throughout the cervical spine, most prominent C5-6 and C6-7.  Mild facet degeneration.  Sclerotic lesion C3 vertebral body appears benign. Atherosclerotic calcification carotid artery bilaterally.  IMPRESSION: Negative for fracture.   Original Report Authenticated By: Janeece Riggers, M.D.   Dg Hand 2 View Right  07/03/2012   *RADIOLOGY REPORT*  Clinical Data: Fall.  Right hand and forearm  lacerations, pain.  RIGHT HAND - 2 VIEW  Comparison: None.  Findings: No acute bony abnormality.  Specifically, no fracture, subluxation, or dislocation.  Soft tissues are intact. Joint spaces are maintained.  Normal bone mineralization.  No radiopaque foreign bodies.  IMPRESSION: No acute bony abnormality.   Original Report Authenticated By: Charlett Nose, M.D.   Ct Maxillofacial Wo Cm  07/02/2012   *RADIOLOGY REPORT*  Clinical Data:  Fall.  Head injury  CT HEAD WITHOUT CONTRAST CT MAXILLOFACIAL WITHOUT CONTRAST CT CERVICAL SPINE WITHOUT CONTRAST  Technique:  Multidetector CT imaging of the head, cervical spine, and maxillofacial structures were performed using the standard protocol without intravenous contrast. Multiplanar CT image reconstructions of the cervical spine and maxillofacial structures were also generated.  Comparison:  MRI head 12/15/2006  CT HEAD  Findings: Moderate atrophy and chronic microvascular ischemic change.  No acute infarct.  Negative for hemorrhage or mass.  No subdural fluid collection.  Soft tissue swelling over the right eye.  Negative for skull fracture.  IMPRESSION: Moderate atrophy and chronic microvascular ischemia.  No acute abnormality.  CT MAXILLOFACIAL  Findings:  Soft tissue swelling over the right eye and maxilla.  Negative for facial fracture.  No orbital fracture is identified. Nasal bone is intact.  No fracture of the mandible.  Mild mucosal edema in  the paranasal sinuses.  No air-fluid level.  IMPRESSION: Negative for facial fracture.  CT CERVICAL SPINE  Findings:   Negative for fracture.  Disc degeneration and spondylosis throughout the cervical spine, most prominent C5-6 and C6-7.  Mild facet degeneration.  Sclerotic lesion C3 vertebral body appears benign. Atherosclerotic calcification carotid artery bilaterally.  IMPRESSION: Negative for fracture.   Original Report Authenticated By: Janeece Riggers, M.D.   1. Contusion of face, subsequent encounter   2. Abrasion, face w/o infection     MDM  Jossie Ng Michelotti present 24 hours after fall with persistent oozing from right facial abrasion/laceration and patient takes blood thinners.  Patient was seen last night in the emergency department and evaluated for intracerebral hemorrhage.  CT scan of the face, head and cervical spine negative. I personally reviewed the imaging tests through PACS system.  I reviewed available ER/hospitalization records through the EMR.  Abrasion and small laceration noted to the right cheek. Site continues to ooze.  Use of silver nitrate on abrasion with subsequent pressure created hemostasis.  Discussed wound care and further actions if wound reopened. Suggested follow up care with primary care physician next week.  I have also discussed reasons to return immediately to the ER.  Patient expresses understanding and agrees with plan.  Dr. Mancel Bale was consulted, evaluated this patient with me and agrees with the plan.     I personally performed the services described in this documentation, which was scribed in my presence. The recorded information has been reviewed and is accurate.     Dahlia Client Zalyn Amend, PA-C 07/03/12 2037

## 2012-07-03 NOTE — ED Notes (Signed)
Pt was here last night after a fall, discharged home. Wife states R cheek abrasion continues to ooze blood since, she was unable to get the bleeding stopped at home with pressure. Minimal bleeding now. Pt is on blood thinners. A&Ox4, resp e/u

## 2012-07-04 NOTE — ED Provider Notes (Signed)
I saw and evaluated the patient, reviewed the resident's note and I agree with the findings and plan. Pt with fall and R eye contusion. Normal neuro exam. CT's neg. Return precautions given.   Loren Racer, MD 07/04/12 2330

## 2012-07-05 NOTE — ED Provider Notes (Signed)
Medical screening examination/treatment/procedure(s) were performed by non-physician practitioner and as supervising physician I was immediately available for consultation/collaboration.  Flint Melter, MD 07/05/12 [phone]

## 2012-09-15 DIAGNOSIS — H40039 Anatomical narrow angle, unspecified eye: Secondary | ICD-10-CM | POA: Diagnosis not present

## 2012-09-15 DIAGNOSIS — H5700 Unspecified anomaly of pupillary function: Secondary | ICD-10-CM | POA: Diagnosis not present

## 2012-09-15 DIAGNOSIS — H47239 Glaucomatous optic atrophy, unspecified eye: Secondary | ICD-10-CM | POA: Diagnosis not present

## 2012-09-15 DIAGNOSIS — H259 Unspecified age-related cataract: Secondary | ICD-10-CM | POA: Diagnosis not present

## 2012-09-20 ENCOUNTER — Telehealth (HOSPITAL_COMMUNITY): Payer: Self-pay | Admitting: Cardiology

## 2012-09-20 NOTE — Telephone Encounter (Signed)
Pt called to request order for Eliquis to be d/c up to one week prior to surgery. Current letter says one day prior however his surgeon would like to stop a week prior at least 4 days.  Please advise

## 2012-09-21 NOTE — Telephone Encounter (Signed)
Per Dr Gala Romney this is ok for pt to be off for a week for eye surgery, however pt is requesting a letter stating this, will send to Dr Gala Romney to complete letter

## 2012-09-24 ENCOUNTER — Encounter (HOSPITAL_COMMUNITY): Payer: Self-pay | Admitting: Internal Medicine

## 2012-09-24 NOTE — Telephone Encounter (Signed)
Letter written. Please see Letters section.

## 2012-10-17 DIAGNOSIS — Z9889 Other specified postprocedural states: Secondary | ICD-10-CM | POA: Diagnosis not present

## 2012-10-17 DIAGNOSIS — H5700 Unspecified anomaly of pupillary function: Secondary | ICD-10-CM | POA: Diagnosis not present

## 2012-10-17 DIAGNOSIS — H2510 Age-related nuclear cataract, unspecified eye: Secondary | ICD-10-CM | POA: Diagnosis not present

## 2012-10-17 DIAGNOSIS — H25049 Posterior subcapsular polar age-related cataract, unspecified eye: Secondary | ICD-10-CM | POA: Diagnosis not present

## 2012-10-17 DIAGNOSIS — H25019 Cortical age-related cataract, unspecified eye: Secondary | ICD-10-CM | POA: Diagnosis not present

## 2012-10-17 DIAGNOSIS — H409 Unspecified glaucoma: Secondary | ICD-10-CM | POA: Diagnosis not present

## 2012-10-17 DIAGNOSIS — H269 Unspecified cataract: Secondary | ICD-10-CM | POA: Diagnosis not present

## 2012-10-23 ENCOUNTER — Encounter: Payer: Self-pay | Admitting: Internal Medicine

## 2012-10-23 ENCOUNTER — Ambulatory Visit (INDEPENDENT_AMBULATORY_CARE_PROVIDER_SITE_OTHER): Payer: Medicare Other | Admitting: Internal Medicine

## 2012-10-23 VITALS — BP 124/62 | HR 77 | Temp 97.2°F | Ht 71.0 in | Wt 161.0 lb

## 2012-10-23 DIAGNOSIS — Z0000 Encounter for general adult medical examination without abnormal findings: Secondary | ICD-10-CM

## 2012-10-23 DIAGNOSIS — I10 Essential (primary) hypertension: Secondary | ICD-10-CM

## 2012-10-23 DIAGNOSIS — Z23 Encounter for immunization: Secondary | ICD-10-CM | POA: Diagnosis not present

## 2012-10-23 DIAGNOSIS — I2510 Atherosclerotic heart disease of native coronary artery without angina pectoris: Secondary | ICD-10-CM

## 2012-10-23 DIAGNOSIS — E785 Hyperlipidemia, unspecified: Secondary | ICD-10-CM

## 2012-10-23 NOTE — Progress Notes (Signed)
Subjective:    Patient ID: Larry Lopez, male    DOB: 1927/01/02, 79 y.o.   MRN: [id number]  HPI The patient is here for annual Medicare wellness examination and management of other chronic and acute problems.  Interval history - had cataract with IOL OS in the past 10 days - doing ok.  C/O:  positional vertigo  Nocturia  X 3, due to BPH/BOO, not disrupting sleep.   Shuffling gait with decreased balance - no falls and not tripping. Did have a fall.  Some stress overall.  Acid reflux - takes H2 blocker  Dry skin - worse after a hot shower.    The risk factors are reflected in the social history.  The roster of all physicians providing medical care to patient - is listed in the Snapshot section of the chart.  Activities of daily living:  The patient is 100% inedpendent in all ADLs: dressing, toileting, feeding as well as independent mobility  Home safety : The patient has smoke detectors in the home. Falls - had one in June - not LOC or balance issue. They wear seatbelts. No firearms at home. There is no violence in the home.   There is no risks for hepatitis, STDs or HIV. There is no history of blood transfusion - did have CABG. They have no travel history to infectious disease endemic areas of the world.  The patient has seen their dentist in the last six month. They have seen their eye doctor in the last year. They admit to any hearing difficulty and have had audiologic testing in the last year.    They do not  have excessive sun exposure. Discussed the need for sun protection: hats, long sleeves and use of sunscreen if there is significant sun exposure.   Diet: the importance of a healthy diet is discussed. They do have a healthy diet.  The patient has a regular exercise program: regular exercise/balance/training , 30 min duration, 5 per week.  The benefits of regular aerobic exercise were discussed.  Depression screen: there are no signs or vegative symptoms of depression-  irritability, change in appetite, anhedonia, sadness/tearfullness.  Cognitive assessment: the patient manages all their financial and personal affairs and is actively engaged.   The following portions of the patient's history were reviewed and updated as appropriate: allergies, current medications, past family history, past medical history,  past surgical history, past social history  and problem list.  Vision, hearing, body mass index were assessed and reviewed.   During the course of the visit the patient was educated and counseled about appropriate screening and preventive services including : fall prevention , diabetes screening, nutrition counseling, colorectal cancer screening, and recommended immunizations.  Past Medical History  Diagnosis Date  . CAD (coronary artery disease)   . HTN (hypertension)   . HLD (hyperlipidemia)   . Bradycardia   . Glaucoma   . Atrial fibrillation   . Tachy-brady syndrome    Past Surgical History  Procedure Laterality Date  . Tonsillectomy and adenoidectomy    . Coronary artery bypass graft    . Cardiac surgery     Family History  Problem Relation Age of Onset  . Cancer    . Diabetes    . Coronary artery disease     History   Social History  . Marital Status: Married    Spouse Name: N/A    Number of Children: 2  . Years of Education: N/A   Occupational History  . accountant  Social History Main Topics  . Smoking status: Former Smoker    Quit date: 01/12/1967  . Smokeless tobacco: Never Used  . Alcohol Use: 14.0 oz/week    28 drink(s) per week  . Drug Use: No  . Sexual Activity: Yes   Other Topics Concern  . Not on file   Social History Narrative   HSG, [place]. 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 Has weathered the recession OK with some adjustments. [facility]  planning - patient does not want cardiac resuscitation - provided a signed "out of facility" order for home display (Aug '12) and did review the MOST form, providing an unsigned copy to be used for home discussion with report back.              Current Outpatient Prescriptions on File Prior to Visit  Medication Sig Dispense Refill  . amLODipine (NORVASC) 2.5 MG tablet take 1 tablet by mouth once daily  90 tablet  5  . apixaban (ELIQUIS) 5 MG TABS tablet Take 5 mg by mouth 2 (two) times daily.      [name] aspirin 81 MG tablet Take 81 mg by mouth daily.        [name] atorvastatin (LIPITOR) 40 MG tablet Take 40 mg by mouth daily.      . Calcium Carbonate (CALCIUM 600) 1500 MG TABS Take 1 tablet by mouth daily.       . chlorthalidone (HYGROTON) 25 MG tablet Take 25 mg by mouth daily.      . metoprolol tartrate (LOPRESSOR) 25 MG tablet Take 25 mg by mouth as needed. Only uses if needed for tachycardia. Has not needed in over a year.      . Multiple Vitamin (MULTIVITAMIN) tablet Take 1 tablet by mouth daily.       . nitroGLYCERIN (NITROSTAT) 0.4 MG SL tablet Place 0.4 mg under the tongue every 5 (five) minutes as needed for chest pain.       . TRAVATAN Z 0.004 % ophthalmic solution Place 1 drop into both eyes daily.        Current Facility-Administered Medications on File Prior to Visit  Medication Dose Route Frequency Provider Last Rate Last Dose  . TDaP (BOOSTRIX) injection 0.5 mL  0.5 mL Intramuscular Once Jacques Navy, MD          Review of Systems Constitutional:  Negative for fever, chills, activity change and unexpected weight change.  HEENT:  Negative for hearing loss, ear pain, congestion, neck stiffness and postnasal drip. Negative for sore throat or swallowing problems. Negative for dental complaints.   Eyes: Negative for vision loss or change in visual acuity.  Respiratory: Negative for chest tightness and wheezing. Negative for DOE.   Cardiovascular: Negative for chest pain or  palpitations. No decreased exercise tolerance Gastrointestinal: No change in bowel habit. No bloating or gas. No reflux or indigestion Genitourinary: Negative for frequency, flank pain and difficulty urinating. Nocturia. Urgency. Musculoskeletal: Negative for myalgias, back pain, arthralgias and gait problem.  Neurological: Negative for dizziness, tremors, weakness and headaches. Balance Hematological: Negative for adenopathy.  Psychiatric/Behavioral: Negative for behavioral problems and dysphoric mood.       Objective:   Physical Exam Filed Vitals:   10/23/12 1334  BP: 124/62  Pulse: 77  Temp: 97.2 F (36.2 C)   Wt Readings from Last 3 Encounters:  10/23/12  161 lb (73.029 kg)  07/03/12 155 lb (70.308 kg)  07/02/12 155 lb (70.308 kg)   Gen'l: Well nourished well developed older male in no acute distress  HEENT: Head: Normocephalic and atraumatic. Right Ear: External ear normal. EAC/TM nl. Left Ear: External ear normal.  EAC/TM nl. Nose: Nose normal. Mouth/Throat: Oropharynx is clear and moist. Dentition - native, in good repair. No buccal or palatal lesions. Posterior pharynx clear. Eyes: Conjunctivae and sclera clear. EOM intact. Pupils are equal, round, and reactive to light. Right eye exhibits no discharge. Left eye exhibits no discharge. Neck: Normal range of motion. Neck supple. No JVD present. No tracheal deviation present. No thyromegaly present.  Cardiovascular: Normal rate, regular rhythm, no gallop, no friction rub, no murmur heard.      Quiet precordium. 2+ radial and DP pulses . No carotid bruits Pulmonary/Chest: Effort normal. No respiratory distress or increased WOB, no wheezes, no rales. No chest wall deformity or CVAT. Well healed sternotomy scar. Abdomen: Soft. Bowel sounds are normal in all quadrants. He exhibits no distension, no tenderness, no rebound or guarding, No heptosplenomegaly  Genitourinary:  deferred Musculoskeletal: Normal range of motion. He exhibits no  edema and no tenderness.       Small and large joints without redness, synovial thickening or deformity. Full range of motion preserved about all small, median and large joints.  Lymphadenopathy:    He has no cervical or supraclavicular adenopathy.  Neurological: He is alert and oriented to person, place, and time. CN II-XII intact. DTRs 2+ and symmetrical biceps, radial and patellar tendons. Normal 'get up and go," normal gait with 50 foot walk, normal turn. Cerebellar function normal with no tremor, rigidity.  Skin: Skin is warm and dry. No rash noted. No erythema.  Psychiatric: He has a normal mood and affect. His behavior is normal. Thought content normal.   Labs done in March '14 and June '14. No need to repeat.      Assessment & Plan:

## 2012-10-23 NOTE — Patient Instructions (Signed)
You are doing great. You gait, on my exam, is normal. No evidence to suggest any parkinsonian gait issue or other central nervous system dysfunction. If it is fear of falling that hinders your gait - have a trip free environment, continue to work on the balance and consider the use of a cane.  Your exam is otherwise normal.  You are current in regard to lab work.  Immunization status is current and now with Prevnar 13 complete. Please delay the flu shot for two weeks.

## 2012-10-24 NOTE — Assessment & Plan Note (Signed)
Interval history - unremarkable. Physical exam is normal. Reviewed previous labs - OK. He is current with immunizations and is given Prevnar today. He is aged out of colorectal and prostate cancer screening. He is advised to continue his activity level, to consider limiting wine to 2 x 40z glasses daily.  In summary A charming man who is medically stable at today's exam. He is encouraged to continue exercise and balance training, to watch his posture while walking and if in doubt use a cane.  He will return prn.

## 2012-10-24 NOTE — Assessment & Plan Note (Signed)
BP Readings from Last 3 Encounters:  10/23/12 124/62  07/03/12 159/70  07/03/12 130/60   Good control on present meds. Bmet in June '14 - normal  Plan Continue present medications

## 2012-10-24 NOTE — Assessment & Plan Note (Signed)
Taking and tolerating medication. Last lab March '14 - good control with LDL at goal. LFTs normal  Plan- Continue present meds

## 2012-10-24 NOTE — Assessment & Plan Note (Signed)
Stable and doing well. Current re: cardiology follow up. Good risk reduction/management.

## 2012-11-14 DIAGNOSIS — Z23 Encounter for immunization: Secondary | ICD-10-CM | POA: Diagnosis not present

## 2012-11-21 DIAGNOSIS — H25039 Anterior subcapsular polar age-related cataract, unspecified eye: Secondary | ICD-10-CM | POA: Diagnosis not present

## 2012-11-21 DIAGNOSIS — H409 Unspecified glaucoma: Secondary | ICD-10-CM | POA: Diagnosis not present

## 2012-11-21 DIAGNOSIS — H269 Unspecified cataract: Secondary | ICD-10-CM | POA: Diagnosis not present

## 2012-11-21 DIAGNOSIS — H2510 Age-related nuclear cataract, unspecified eye: Secondary | ICD-10-CM | POA: Diagnosis not present

## 2012-11-21 DIAGNOSIS — H21569 Pupillary abnormality, unspecified eye: Secondary | ICD-10-CM | POA: Diagnosis not present

## 2012-11-21 DIAGNOSIS — H25049 Posterior subcapsular polar age-related cataract, unspecified eye: Secondary | ICD-10-CM | POA: Diagnosis not present

## 2012-11-21 DIAGNOSIS — Z9889 Other specified postprocedural states: Secondary | ICD-10-CM | POA: Diagnosis not present

## 2012-11-21 DIAGNOSIS — H5700 Unspecified anomaly of pupillary function: Secondary | ICD-10-CM | POA: Diagnosis not present

## 2012-12-29 DIAGNOSIS — H40249 Residual stage of angle-closure glaucoma, unspecified eye: Secondary | ICD-10-CM | POA: Diagnosis not present

## 2013-01-29 DIAGNOSIS — Z85828 Personal history of other malignant neoplasm of skin: Secondary | ICD-10-CM | POA: Diagnosis not present

## 2013-01-29 DIAGNOSIS — L820 Inflamed seborrheic keratosis: Secondary | ICD-10-CM | POA: Diagnosis not present

## 2013-01-29 DIAGNOSIS — L570 Actinic keratosis: Secondary | ICD-10-CM | POA: Diagnosis not present

## 2013-02-01 DIAGNOSIS — H01009 Unspecified blepharitis unspecified eye, unspecified eyelid: Secondary | ICD-10-CM | POA: Diagnosis not present

## 2013-02-01 DIAGNOSIS — T887XXA Unspecified adverse effect of drug or medicament, initial encounter: Secondary | ICD-10-CM | POA: Diagnosis not present

## 2013-02-01 DIAGNOSIS — H16209 Unspecified keratoconjunctivitis, unspecified eye: Secondary | ICD-10-CM | POA: Diagnosis not present

## 2013-02-02 ENCOUNTER — Ambulatory Visit (HOSPITAL_COMMUNITY)
Admission: RE | Admit: 2013-02-02 | Discharge: 2013-02-02 | Disposition: A | Discharge status: Home / Self Care | Payer: Medicare Other | Source: Ambulatory Visit | Attending: Internal Medicine | Admitting: Internal Medicine

## 2013-02-02 ENCOUNTER — Encounter (HOSPITAL_COMMUNITY): Payer: Self-pay | Admitting: *Deleted

## 2013-02-02 ENCOUNTER — Telehealth (HOSPITAL_COMMUNITY): Payer: Self-pay | Admitting: *Deleted

## 2013-02-02 VITALS — BP 136/76 | HR 102 | Wt 160.0 lb

## 2013-02-02 DIAGNOSIS — I4891 Unspecified atrial fibrillation: Secondary | ICD-10-CM

## 2013-02-02 NOTE — Telephone Encounter (Signed)
Pt called this AM c/o increased HR, he states it usually runs 50-60s but yesterday he went to eye MD and his HR was 100, he states he took a Metoprolol when he got home and last night HR was around 80s, however this AM HR was 100 again, he denies any symptoms.  Discussed with Dr Haroldine Laws will bring pt in for EKG to check for a-fib, pt agreeable

## 2013-02-02 NOTE — Patient Instructions (Signed)
Take Metoprolol Twice daily   Your physician has recommended that you have a Cardioversion (DCCV). Electrical Cardioversion uses a jolt of electricity to your heart either through paddles or wired patches attached to your chest. This is a controlled, usually prescheduled, procedure. Defibrillation is done under light anesthesia in the hospital, and you usually go home the day of the procedure. This is done to get your heart back into a normal rhythm. You are not awake for the procedure. Please see the instruction sheet given to you today.

## 2013-02-02 NOTE — Progress Notes (Signed)
Pt came for EKG, which showed a-fib HR 83, pt is asymptomatic.  Reviewed info with Dr Haroldine Laws he would like to start taking his metoprolol BID, continue Eliquis and have DCCV next week.  Pt agreeable and DCCV sch for 1/27 at 12 pm, instructions reviewed with pt

## 2013-02-06 ENCOUNTER — Encounter (HOSPITAL_COMMUNITY)
Admission: RE | Disposition: A | Discharge status: Home / Self Care | Payer: Self-pay | Source: Ambulatory Visit | Attending: Internal Medicine

## 2013-02-06 ENCOUNTER — Ambulatory Visit (HOSPITAL_COMMUNITY)
Admission: RE | Admit: 2013-02-06 | Discharge: 2013-02-06 | Disposition: A | Discharge status: Home / Self Care | Payer: Medicare Other | Source: Ambulatory Visit | Attending: Internal Medicine | Admitting: Internal Medicine

## 2013-02-06 ENCOUNTER — Ambulatory Visit (HOSPITAL_COMMUNITY): Payer: Medicare Other | Admitting: Anesthesiology

## 2013-02-06 ENCOUNTER — Encounter (HOSPITAL_COMMUNITY): Payer: Self-pay | Admitting: *Deleted

## 2013-02-06 ENCOUNTER — Encounter (HOSPITAL_COMMUNITY): Payer: Medicare Other | Admitting: Anesthesiology

## 2013-02-06 DIAGNOSIS — I4891 Unspecified atrial fibrillation: Secondary | ICD-10-CM | POA: Insufficient documentation

## 2013-02-06 DIAGNOSIS — Z951 Presence of aortocoronary bypass graft: Secondary | ICD-10-CM | POA: Diagnosis not present

## 2013-02-06 DIAGNOSIS — Z87891 Personal history of nicotine dependence: Secondary | ICD-10-CM | POA: Insufficient documentation

## 2013-02-06 DIAGNOSIS — I252 Old myocardial infarction: Secondary | ICD-10-CM | POA: Diagnosis not present

## 2013-02-06 DIAGNOSIS — E785 Hyperlipidemia, unspecified: Secondary | ICD-10-CM | POA: Diagnosis not present

## 2013-02-06 DIAGNOSIS — H409 Unspecified glaucoma: Secondary | ICD-10-CM | POA: Insufficient documentation

## 2013-02-06 DIAGNOSIS — I495 Sick sinus syndrome: Secondary | ICD-10-CM | POA: Diagnosis not present

## 2013-02-06 DIAGNOSIS — I2510 Atherosclerotic heart disease of native coronary artery without angina pectoris: Secondary | ICD-10-CM | POA: Insufficient documentation

## 2013-02-06 DIAGNOSIS — I10 Essential (primary) hypertension: Secondary | ICD-10-CM | POA: Insufficient documentation

## 2013-02-06 DIAGNOSIS — Z7901 Long term (current) use of anticoagulants: Secondary | ICD-10-CM | POA: Insufficient documentation

## 2013-02-06 DIAGNOSIS — Z9861 Coronary angioplasty status: Secondary | ICD-10-CM | POA: Diagnosis not present

## 2013-02-06 HISTORY — PX: CARDIOVERSION: SHX1299

## 2013-02-06 LAB — BASIC METABOLIC PANEL
BUN: 12 mg/dL (ref 6–23)
CALCIUM: 9.1 mg/dL (ref 8.4–10.5)
CO2: 25 meq/L (ref 19–32)
CREATININE: 0.91 mg/dL (ref 0.50–1.35)
Chloride: 106 mEq/L (ref 96–112)
GFR calc non Af Amer: 76 mL/min — ABNORMAL LOW (ref >90)
GFR, EST AFRICAN AMERICAN: 88 mL/min — AB (ref >90)
Glucose, Bld: 91 mg/dL (ref 70–99)
Potassium: 4.4 mEq/L (ref 3.7–5.3)
Sodium: 144 mEq/L (ref 137–147)

## 2013-02-06 LAB — CBC WITH DIFFERENTIAL/PLATELET
BASOS ABS: 0 10*3/uL (ref 0.0–0.1)
Basophils Relative: 1 % (ref 0–1)
Eosinophils Absolute: 0.1 10*3/uL (ref 0.0–0.7)
Eosinophils Relative: 2 % (ref 0–5)
HCT: 41.9 % (ref 39.0–52.0)
Hemoglobin: 14.5 g/dL (ref 13.0–17.0)
LYMPHS ABS: 1.5 10*3/uL (ref 0.7–4.0)
LYMPHS PCT: 26 % (ref 12–46)
MCH: 32 pg (ref 26.0–34.0)
MCHC: 34.6 g/dL (ref 30.0–36.0)
MCV: 92.5 fL (ref 78.0–100.0)
Monocytes Absolute: 0.6 10*3/uL (ref 0.1–1.0)
Monocytes Relative: 10 % (ref 3–12)
NEUTROS ABS: 3.4 10*3/uL (ref 1.7–7.7)
NEUTROS PCT: 61 % (ref 43–77)
PLATELETS: 178 10*3/uL (ref 150–400)
RBC: 4.53 MIL/uL (ref 4.22–5.81)
RDW: 14.4 % (ref 11.5–15.5)
WBC: 5.9 10*3/uL (ref 4.0–10.5)

## 2013-02-06 SURGERY — CARDIOVERSION
Anesthesia: General

## 2013-02-06 MED ORDER — LIDOCAINE HCL (CARDIAC) 20 MG/ML IV SOLN
INTRAVENOUS | Status: DC | PRN
  Administered 2013-02-06: 40 mg via INTRAVENOUS

## 2013-02-06 MED ORDER — PROPOFOL 10 MG/ML IV BOLUS
INTRAVENOUS | Status: DC | PRN
  Administered 2013-02-06: 55 mg via INTRAVENOUS

## 2013-02-06 NOTE — CV Procedure (Addendum)
     DIRECT CURRENT CARDIOVERSION  NAME:  Larry Lopez   MRN: [id number] DOB:  21-Feb-1926   ADMIT DATE: 02/06/2013   INDICATIONS: Atrial fibrillation    PROCEDURE:   Informed consent was obtained prior to the procedure. The risks, benefits and alternatives for the procedure were discussed and the patient comprehended these risks. Once an appropriate time out was taken, the patient had the defibrillator pads placed in the anterior and posterior position. The patient then underwent sedation by the anesthesia service. Once an appropriate level of sedation was achieved, the patient received a single biphasic, synchronized 200J shock with prompt conversion to sinus rhythm. No apparent complications.   CONLCUSION:   1.  Successful DC-CV 2. Switch metoprolol back to prn due to tachy-brady syndrome  Benay Spice 12:31 PM

## 2013-02-06 NOTE — Discharge Instructions (Signed)
Electrical Cardioversion °Electrical cardioversion is the delivery of a jolt of electricity to change the rhythm of the heart. Sticky patches or metal paddles are placed on the chest to deliver the electricity from a device. This is done to restore a normal rhythm. A rhythm that is too fast or not regular keeps the heart from pumping well. °Electrical cardioversion is done in an emergency if:  °· There is low or no blood pressure as a result of the heart rhythm.   °· Normal rhythm must be restored as fast as possible to protect the brain and heart from further damage.   °· It may save a life. °Cardioversion may be done for heart rhythms that are not immediately life-threatening, such as atrial fibrillation or flutter, in which:  °· The heart is beating too fast or is not regular.   °· Medicine to change the rhythm has not worked.   °· It is safe to wait in order to allow time for preparation. °· Symptoms of the abnormal rhythm are bothersome. °· The risk of stroke and other serious complications can be reduced. °LET YOUR CAREGIVER KNOW ABOUT:  °· All medicines you are taking, including vitamins, herbs, eye drops, creams, and over-the-counter medicines.   °· Previous problems you or members of your family have had with the use of anesthetics.   °· Any blood disorders you have.   °· Previous surgeries you have had.   °· Medical conditions you have. °RISKS AND COMPLICATIONS  °Generally, this is a safe procedure. However, as with any procedure, complications can occur. Possible complications include:  °· Breathing problems related to the anesthetic used. °· Cardiac arrest This risk is rare. °· A blood clot that breaks free and travels to other parts of your body. This could cause a stroke or other problems. The risk of this is lowered by use of blood thinning medicine (anticoagulant) prior to the procedure. °BEFORE THE PROCEDURE  °· You may have tests to detect blood clots in your heart and evaluate heart  function.  °· You may start taking anticoagulants so your blood does not clot as easily.   °· Medicines may be given to help stabilize your heart rate and rhythm. °PROCEDURE °· You will be given medicine through an IV tube to reduce discomfort and make you sleepy (sedative).   °· An electrical shock will be delivered. °AFTER THE PROCEDURE °Your heart rhythm will be watched to make sure it does not change. You may be able to go home within a few hours.  °Document Released: 12/18/2001 Document Revised: 10/18/2012 Document Reviewed: 07/12/2012 °Exit[employer]® Patient Information ©2014 [employer]. ° °

## 2013-02-06 NOTE — Anesthesia Postprocedure Evaluation (Signed)
  Anesthesia Post-op Note  Patient: Larry Lopez Early  Procedure(s) Performed: Procedure(s): CARDIOVERSION (N/A)  Patient Location: PACU  Anesthesia Type:General  Level of Consciousness: awake, alert , oriented and patient cooperative  Airway and Oxygen Therapy: Patient Spontanous Breathing  Post-op Pain: none  Post-op Assessment: Post-op Vital signs reviewed, Patient's Cardiovascular Status Stable, Respiratory Function Stable, Patent Airway, No signs of Nausea or vomiting and Pain level controlled  Post-op Vital Signs: stable  Complications: No apparent anesthesia complications

## 2013-02-06 NOTE — Transfer of Care (Signed)
Immediate Anesthesia Transfer of Care Note  Patient: Larry Lopez  Procedure(s) Performed: Procedure(s): CARDIOVERSION (N/A)  Patient Location: PACU and Short Stay  Anesthesia Type:General  Level of Consciousness: awake, alert , oriented and sedated  Airway & Oxygen Therapy: Patient Spontanous Breathing and Patient connected to nasal cannula oxygen  Post-op Assessment: Report given to PACU RN, Post -op Vital signs reviewed and stable and Patient moving all extremities  Post vital signs: Reviewed and stable  Complications: No apparent anesthesia complications

## 2013-02-06 NOTE — H&P (Signed)
HPI:  Larry Lopez is a very pleasant 80 year old male with a history of a coronary artery disease status post redo bypass surgery in 2002 by Dr. Laneta Simmers.[name] He also has a history of hypertension and hyperlipidemia, PAF complicated by tachy-brady syndrome with significant resting bradycardia prohibiting AV-Nodal blockade. Was hospitalized in 6/10 with rapid AF.  He saw Dr. Graciela Husbands two years ago for consideration of pacemaker but it was thought that as AF is infrequent and self-limiting and his bradycardia is asymptomatic that we would watch it for now and avoid AV-nodal blockers due to bradycardia. If AF burden increased would then consider pacing and re-insitutuion of AV-nodal blockers.  Recently found to be in AF after visit to dentist office. HR was elevated. Lopressor increased. Relatively asx.   Has been on Eliquis without interruption > 10month. No bleeding   ECG: AF with rates in 60s     Review of Systems:     Cardiac Review of Systems: {Y] = yes [ ]  = no  Chest Pain [    ]  Resting SOB [   ] Exertional SOB  [  ]  Orthopnea [  ]   Pedal Edema [   ]    Palpitations [  ] Syncope  [  ]   Presyncope [   ]  General Review of Systems: [Y] = yes [  ]=no Constitional: recent weight change [  ]; anorexia [  ]; fatigue [  ]; nausea [  ]; night sweats [  ]; fever [  ]; or chills [  ];                                                                                                                                          Dental: poor dentition[  ];  Eye : blurred vision [  ]; diplopia [   ]; vision changes [  ];  Amaurosis fugax[  ]; Resp: cough [  ];  wheezing[  ];  hemoptysis[  ]; shortness of breath[  ]; paroxysmal nocturnal dyspnea[  ]; dyspnea on exertion[  ]; or orthopnea[  ];  GI:  gallstones[  ], vomiting[  ];  dysphagia[  ]; melena[  ];  hematochezia [  ]; heartburn[  ];   Hx of  Colonoscopy[  ]; GU: kidney stones [  ]; hematuria[  ];   dysuria [  ];  nocturia[  ];  history of     obstruction [   ];                 Skin: rash, swelling[  ];, hair loss[  ];  peripheral edema[  ];  or itching[  ]; Musculosketetal: myalgias[  ];  joint swelling[  ];  joint erythema[  ];  joint pain[y  ];  back pain[  ];  Heme/Lymph: bruising[  ];  bleeding[  ];  anemia[  ];  Neuro: TIA[  ];  headaches[  ];  stroke[  ];  vertigo[  ];  seizures[  ];   paresthesias[  ];  difficulty walking[  ];  Psych:depression[  ]; anxiety[  ];  Endocrine: diabetes[  ];  thyroid dysfunction[  ];  Immunizations: Flu [  ]; Pneumococcal[  ];  Other:  Past Medical History  Diagnosis Date  . CAD (coronary artery disease)   . HTN (hypertension)   . HLD (hyperlipidemia)   . Bradycardia   . Glaucoma   . Atrial fibrillation   . Tachy-brady syndrome     Medications Prior to Admission  Medication Sig Dispense Refill  . amLODipine (NORVASC) 2.5 MG tablet take 1 tablet by mouth once daily  90 tablet  5  . apixaban (ELIQUIS) 5 MG TABS tablet Take 5 mg by mouth 2 (two) times daily.      [name] aspirin 81 MG tablet Take 81 mg by mouth daily.        [name] atorvastatin (LIPITOR) 40 MG tablet Take 40 mg by mouth daily.      . Calcium Carbonate (CALCIUM 600) 1500 MG TABS Take 1 tablet by mouth daily.       . chlorthalidone (HYGROTON) 25 MG tablet Take 25 mg by mouth daily.      . metoprolol tartrate (LOPRESSOR) 25 MG tablet Take 25 mg by mouth as needed. Only uses if needed for tachycardia. Has not needed in over a year.      . Multiple Vitamin (MULTIVITAMIN) tablet Take 1 tablet by mouth daily.       . TRAVATAN Z 0.004 % ophthalmic solution Place 1 drop into both eyes daily.       . nitroGLYCERIN (NITROSTAT) 0.4 MG SL tablet Place 0.4 mg under the tongue every 5 (five) minutes as needed for chest pain.          No Known Allergies  History   Social History  . Marital Status: Married    Spouse Name: N/A    Number of Children: 2  . Years of Education: N/A   Occupational History  . accountant    Social History Main Topics  .  Smoking status: Former Smoker    Quit date: 01/12/1967  . Smokeless tobacco: Never Used  . Alcohol Use: 14.0 oz/week    28 drink(s) per week  . Drug Use: No  . Sexual Activity: Yes   Other Topics Concern  . Not on file   Social History Narrative   HS[facility], [place]. 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 Has weathered the recession OK with some adjustments. Advanced Care planning - patient does not want cardiac resuscitation - provided a signed "out of facility" order for home display (Aug '12) and did review the MOST form, providing an unsigned copy to be used for home discussion with report back.              Family History  Problem Relation Age of Onset  . Cancer    . Diabetes    . Coronary artery disease      PHYSICAL EXAM: Filed Vitals:   02/06/13 1007  BP: 153/79  Pulse: 78  Temp: 97.5 F (36.4 C)  Resp: 18   General:  Well appearing. No respiratory difficulty HEENT: normal Neck: supple. no JVD. Carotids 2+ bilat; no  bruits. No lymphadenopathy or thryomegaly appreciated. Cor: PMI nondisplaced. IRR, IRR No rubs, gallops or murmurs. Lungs: clear Abdomen: soft, nontender, nondistended. No hepatosplenomegaly. No bruits or masses. Good bowel sounds. Extremities: no cyanosis, clubbing, rash, edema Neuro: alert & oriented x 3, cranial nerves grossly intact. moves all 4 extremities w/o difficulty. Affect pleasant.   Results for orders placed during the hospital encounter of 02/06/13 (from the past 24 hour(s))  BASIC METABOLIC PANEL     Status: Abnormal   Collection Time    02/06/13 10:15 AM      Result Value Range   Sodium 144  137 - 147 mEq/L   Potassium 4.4  3.7 - 5.3 mEq/L   Chloride 106  96 - 112 mEq/L   CO2 25  19 - 32 mEq/L   Glucose, Bld 91  70 - 99 mg/dL   BUN 12  6 - 23 mg/dL   Creatinine, Ser 0.91  0.50 -  1.35 mg/dL   Calcium 9.1  8.4 - 10.5 mg/dL   GFR calc non Af Amer 76 (*) >90 mL/min   GFR calc Af Amer 88 (*) >90 mL/min  CBC WITH DIFFERENTIAL     Status: None   Collection Time    02/06/13 10:15 AM      Result Value Range   WBC 5.9  4.0 - 10.5 K/uL   RBC 4.53  4.22 - 5.81 MIL/uL   Hemoglobin 14.5  13.0 - 17.0 g/dL   HCT 41.9  39.0 - 52.0 %   MCV 92.5  78.0 - 100.0 fL   MCH 32.0  26.0 - 34.0 pg   MCHC 34.6  30.0 - 36.0 g/dL   RDW 14.4  11.5 - 15.5 %   Platelets 178  150 - 400 K/uL   Neutrophils Relative % 61  43 - 77 %   Neutro Abs 3.4  1.7 - 7.7 K/uL   Lymphocytes Relative 26  12 - 46 %   Lymphs Abs 1.5  0.7 - 4.0 K/uL   Monocytes Relative 10  3 - 12 %   Monocytes Absolute 0.6  0.1 - 1.0 K/uL   Eosinophils Relative 2  0 - 5 %   Eosinophils Absolute 0.1  0.0 - 0.7 K/uL   Basophils Relative 1  0 - 1 %   Basophils Absolute 0.0  0.0 - 0.1 K/uL   No results found.   ASSESSMENT: 1. Recurrent AF 2. CAD s/p CABG  PLAN/DISCUSSION:  AF is relatively asx but this is first episode since 2012. I think it is reasonable to proceed with attempt to maintain NSR. He agrees. Will proceed with DC-CV. If AF recurs in near future can switch to rate control strategy. Continue Eliquis.  Daniel Bensimhon,MD 12:30 PM

## 2013-02-06 NOTE — Anesthesia Preprocedure Evaluation (Signed)
Anesthesia Evaluation  Patient identified by MRN, date of birth, ID band Patient awake    Reviewed: Allergy & Precautions, H&P , NPO status , Patient's Chart, lab work & pertinent test results  Airway       Dental   Pulmonary former smoker,          Cardiovascular hypertension, + CAD, + Past MI, + Cardiac Stents and + Peripheral Vascular Disease + dysrhythmias Atrial Fibrillation     Neuro/Psych    GI/Hepatic   Endo/Other    Renal/GU      Musculoskeletal   Abdominal   Peds  Hematology   Anesthesia Other Findings   Reproductive/Obstetrics                           Anesthesia Physical Anesthesia Plan  ASA: III  Anesthesia Plan: General   Post-op Pain Management:    Induction: Intravenous  Airway Management Planned: Mask  Additional Equipment:   Intra-op Plan:   Post-operative Plan:   Informed Consent: I have reviewed the patients History and Physical, chart, labs and discussed the procedure including the risks, benefits and alternatives for the proposed anesthesia with the patient or authorized representative who has indicated his/her understanding and acceptance.     Plan Discussed with:   Anesthesia Plan Comments:         Anesthesia Quick Evaluation

## 2013-02-06 NOTE — Preoperative (Signed)
Beta Blockers   Reason not to administer Beta Blockers:Not Applicable 

## 2013-02-07 ENCOUNTER — Encounter (HOSPITAL_COMMUNITY): Payer: Self-pay | Admitting: Internal Medicine

## 2013-02-22 ENCOUNTER — Ambulatory Visit (HOSPITAL_COMMUNITY)
Admission: RE | Admit: 2013-02-22 | Discharge: 2013-02-22 | Disposition: A | Discharge status: Home / Self Care | Payer: Medicare Other | Source: Ambulatory Visit | Attending: Internal Medicine | Admitting: Internal Medicine

## 2013-02-22 VITALS — BP 122/68 | HR 80 | Wt 162.5 lb

## 2013-02-22 DIAGNOSIS — I4891 Unspecified atrial fibrillation: Secondary | ICD-10-CM | POA: Insufficient documentation

## 2013-02-22 DIAGNOSIS — I10 Essential (primary) hypertension: Secondary | ICD-10-CM | POA: Diagnosis not present

## 2013-02-22 DIAGNOSIS — Z951 Presence of aortocoronary bypass graft: Secondary | ICD-10-CM | POA: Diagnosis not present

## 2013-02-22 DIAGNOSIS — I2510 Atherosclerotic heart disease of native coronary artery without angina pectoris: Secondary | ICD-10-CM | POA: Insufficient documentation

## 2013-02-22 DIAGNOSIS — I495 Sick sinus syndrome: Secondary | ICD-10-CM | POA: Diagnosis not present

## 2013-02-22 DIAGNOSIS — E785 Hyperlipidemia, unspecified: Secondary | ICD-10-CM | POA: Diagnosis not present

## 2013-02-22 NOTE — Progress Notes (Signed)
HPI:  Larry Lopez is a very pleasant 80 year old male with a history of a coronary artery disease status post redo bypass surgery in 2002 by Dr. Cyndia Bent.[name] He also has a history of hypertension and hyperlipidemia, PAF complicated by  tachy-brady syndrome with significant resting bradycardia prohibiting AV-Nodal blockade. Was hospitalized in 6/10 with rapid AF.  He saw Dr. Caryl Comes previously for consideration of pacemaker but it was thought that as AF is infrequent and self-limiting and his bradycardia is asymptomatic  that we would watch it for now and avoid AV-nodal blockers due to bradycardia. If AF burden increased would then consider pacing and re-insitutuion of AV-nodal blockers.  On 02/06/13 underwent DC-CV for recurrent AF.   He returns for routine f/u. He is doing great. Continues to got to Y. Denies dyspnea. SOB or palpitations. Takes BP and pulse every am. Pulse typically 70-80. On a few mornings his HR was ~ 105 and he took a metoprolol tablet 25mg . He has only done this 3 times in 3 weeks. Denies dizziness.   SBP usually 115-120. No bleeding with Eliquis.   Lab Results  Component Value Date   CHOL 155 04/06/2012   HDL 60.20 04/06/2012   LDLCALC 79 04/06/2012   TRIG 78.0 04/06/2012   CHOLHDL 3 04/06/2012      ROS: All systems negative except as listed in HPI, PMH and Problem List.  Past Medical History  Diagnosis Date  . CAD (coronary artery disease)   . HTN (hypertension)   . HLD (hyperlipidemia)   . Bradycardia   . Glaucoma   . Atrial fibrillation   . Tachy-brady syndrome     Current Outpatient Prescriptions  Medication Sig Dispense Refill  . amLODipine (NORVASC) 2.5 MG tablet take 1 tablet by mouth once daily  90 tablet  5  . apixaban (ELIQUIS) 5 MG TABS tablet Take 5 mg by mouth 2 (two) times daily.      [name] aspirin 81 MG tablet Take 81 mg by mouth daily.        [name] atorvastatin (LIPITOR) 40 MG tablet Take 40 mg by mouth daily.      . Calcium Carbonate (CALCIUM 600) 1500 MG TABS  Take 1 tablet by mouth daily.       . chlorthalidone (HYGROTON) 25 MG tablet Take 25 mg by mouth daily.      . metoprolol tartrate (LOPRESSOR) 25 MG tablet Take 25 mg by mouth as needed. Only uses if needed for tachycardia. Has not needed in over a year.      . Multiple Vitamin (MULTIVITAMIN) tablet Take 1 tablet by mouth daily.       . nitroGLYCERIN (NITROSTAT) 0.4 MG SL tablet Place 0.4 mg under the tongue every 5 (five) minutes as needed for chest pain.       [name] timolol (BETIMOL) 0.5 % ophthalmic solution Place 1 drop into both eyes 2 (two) times daily.       No current facility-administered medications for this encounter.     PHYSICAL EXAM: Filed Vitals:   02/22/13 1032  BP: 122/68  Pulse: 80   General:  Well appearing. Thin. No resp difficulty HEENT: normal Neck: supple. JVP flat. Carotids 2+ bilaterally; no bruits. No lymphadenopathy or thryomegaly appreciated. Cor: PMI normal. Irregular rate & rhythm. No rubs, gallops or murmurs. Lungs: clear Abdomen: soft, nontender, nondistended. No hepatosplenomegaly. No bruits or masses. Good bowel sounds. Extremities: no cyanosis, clubbing, rash, edema Neuro: alert & orientedx3, cranial nerves grossly intact. Moves all 4 extremities w/o  difficulty. Affect pleasant.    ECG: AF 77  No ST-T wave abnormalities.    ASSESSMENT & PLAN:  1. AF 2. HTN 3. H/o tachy brady syndrome 4. CAD s/p re-do CABG 2002  He has failed DC-CV. However he is tolerating AF very well. He is asymptomatic with excellent exercise tolerance. Overall, rate is very well controlled with just occasional mils spikes. We talked about the options of using regularly scheduled metoprolol vs using PRN. Given his h/o bradycardia, I think the PRN model is probably best. He will watch his HR closely and take metoprolol if resting HR . 100. Continue Eliquis. BP looks great. CAD stable.   Daniel Bensimhon,MD 11:23 AM

## 2013-02-22 NOTE — Addendum Note (Signed)
Encounter addended by: Scarlette Calico, RN on: 02/22/2013 11:29 AM<BR>     Documentation filed: Patient Instructions Section

## 2013-02-22 NOTE — Patient Instructions (Signed)
We will contact you in 4 months to schedule your next appointment.  

## 2013-02-23 NOTE — Addendum Note (Signed)
Encounter addended by: Vanessa Barbara, CCT on: 02/23/2013  8:30 AM<BR>     Documentation filed: Charges VN

## 2013-03-12 ENCOUNTER — Other Ambulatory Visit (HOSPITAL_COMMUNITY): Payer: Self-pay | Admitting: Internal Medicine

## 2013-03-16 DIAGNOSIS — Z961 Presence of intraocular lens: Secondary | ICD-10-CM | POA: Diagnosis not present

## 2013-03-16 DIAGNOSIS — H40249 Residual stage of angle-closure glaucoma, unspecified eye: Secondary | ICD-10-CM | POA: Diagnosis not present

## 2013-05-21 ENCOUNTER — Other Ambulatory Visit (HOSPITAL_COMMUNITY): Payer: Self-pay

## 2013-05-21 MED ORDER — ATORVASTATIN CALCIUM 40 MG PO TABS: 40.0000 mg | ORAL_TABLET | Freq: Every day | ORAL | Status: DC

## 2013-05-23 ENCOUNTER — Other Ambulatory Visit (HOSPITAL_COMMUNITY): Payer: Self-pay

## 2013-05-23 MED ORDER — CHLORTHALIDONE 25 MG PO TABS: 25.0000 mg | ORAL_TABLET | Freq: Every day | ORAL | Status: DC

## 2013-05-31 ENCOUNTER — Other Ambulatory Visit (HOSPITAL_COMMUNITY): Payer: Self-pay

## 2013-05-31 MED ORDER — CHLORTHALIDONE 25 MG PO TABS: 25.0000 mg | ORAL_TABLET | Freq: Every day | ORAL | Status: DC

## 2013-06-06 ENCOUNTER — Telehealth (HOSPITAL_COMMUNITY): Payer: Self-pay | Admitting: *Deleted

## 2013-06-06 ENCOUNTER — Other Ambulatory Visit (HOSPITAL_COMMUNITY): Payer: Self-pay | Admitting: *Deleted

## 2013-06-06 MED ORDER — CHLORTHALIDONE 25 MG PO TABS: 25.0000 mg | ORAL_TABLET | Freq: Every day | ORAL | Status: DC

## 2013-06-06 MED ORDER — HYDROCHLOROTHIAZIDE 25 MG PO TABS: 25.0000 mg | ORAL_TABLET | Freq: Every day | ORAL | Status: DC

## 2013-06-06 NOTE — Telephone Encounter (Signed)
Pt called and stated he received the chlorthalidone from [facility] but that he has never taken this medication before, the has been on HCTZ 25 mg daily for a very long time and it is free, while the chlorthalidone will cost him $90 for a 90 day supply, since pt has been on HCTZ and wishes to continue due to cost new rx sent to [facility]

## 2013-06-25 DIAGNOSIS — H40249 Residual stage of angle-closure glaucoma, unspecified eye: Secondary | ICD-10-CM | POA: Diagnosis not present

## 2013-07-02 DIAGNOSIS — H2640 Unspecified secondary cataract: Secondary | ICD-10-CM | POA: Diagnosis not present

## 2013-07-02 DIAGNOSIS — H47239 Glaucomatous optic atrophy, unspecified eye: Secondary | ICD-10-CM | POA: Diagnosis not present

## 2013-07-02 DIAGNOSIS — H5340 Unspecified visual field defects: Secondary | ICD-10-CM | POA: Diagnosis not present

## 2013-07-02 DIAGNOSIS — H40249 Residual stage of angle-closure glaucoma, unspecified eye: Secondary | ICD-10-CM | POA: Diagnosis not present

## 2013-07-10 ENCOUNTER — Other Ambulatory Visit (HOSPITAL_COMMUNITY): Payer: Self-pay | Admitting: *Deleted

## 2013-07-10 MED ORDER — AMLODIPINE BESYLATE 2.5 MG PO TABS: ORAL_TABLET | ORAL | Status: DC

## 2013-07-11 ENCOUNTER — Encounter (HOSPITAL_COMMUNITY): Payer: Self-pay | Admitting: Cardiology

## 2013-08-03 ENCOUNTER — Telehealth (HOSPITAL_COMMUNITY): Payer: Self-pay | Admitting: *Deleted

## 2013-08-03 NOTE — Telephone Encounter (Signed)
Pt called to report he isn't feeling well, he states he is having a little more SOB than usual and his chest is "sore" denies pain, states it's been a few days and he just has a "feeling something isn't right" would like to see Dr Haroldine Laws before his appt on 8/17, appt sch for [facility] 7/27

## 2013-08-06 ENCOUNTER — Encounter (HOSPITAL_COMMUNITY): Payer: Self-pay

## 2013-08-06 ENCOUNTER — Ambulatory Visit (HOSPITAL_COMMUNITY)
Admission: RE | Admit: 2013-08-06 | Discharge: 2013-08-06 | Disposition: A | Discharge status: Home / Self Care | Payer: Medicare Other | Source: Ambulatory Visit | Attending: Internal Medicine | Admitting: Internal Medicine

## 2013-08-06 VITALS — BP 120/70 | HR 91 | Wt 159.8 lb

## 2013-08-06 DIAGNOSIS — R0609 Other forms of dyspnea: Secondary | ICD-10-CM | POA: Diagnosis not present

## 2013-08-06 DIAGNOSIS — Z79899 Other long term (current) drug therapy: Secondary | ICD-10-CM | POA: Insufficient documentation

## 2013-08-06 DIAGNOSIS — I2510 Atherosclerotic heart disease of native coronary artery without angina pectoris: Secondary | ICD-10-CM | POA: Insufficient documentation

## 2013-08-06 DIAGNOSIS — E785 Hyperlipidemia, unspecified: Secondary | ICD-10-CM | POA: Diagnosis not present

## 2013-08-06 DIAGNOSIS — I4891 Unspecified atrial fibrillation: Secondary | ICD-10-CM

## 2013-08-06 DIAGNOSIS — Z09 Encounter for follow-up examination after completed treatment for conditions other than malignant neoplasm: Secondary | ICD-10-CM | POA: Diagnosis not present

## 2013-08-06 DIAGNOSIS — R0989 Other specified symptoms and signs involving the circulatory and respiratory systems: Secondary | ICD-10-CM | POA: Diagnosis not present

## 2013-08-06 DIAGNOSIS — Z9861 Coronary angioplasty status: Secondary | ICD-10-CM | POA: Insufficient documentation

## 2013-08-06 DIAGNOSIS — I10 Essential (primary) hypertension: Secondary | ICD-10-CM | POA: Diagnosis not present

## 2013-08-06 DIAGNOSIS — I480 Paroxysmal atrial fibrillation: Secondary | ICD-10-CM

## 2013-08-06 NOTE — Addendum Note (Signed)
Encounter addended by: Kerry Dory, CMA on: 08/06/2013  4:15 PM<BR>     Documentation filed: Patient Instructions Section, Orders

## 2013-08-06 NOTE — Addendum Note (Signed)
Encounter addended by: Kerry Dory, CMA on: 08/06/2013  4:10 PM<BR>     Documentation filed: Visit Diagnoses, Orders

## 2013-08-06 NOTE — Patient Instructions (Addendum)
Your physician has requested that you have a lexiscan myoview. For further information please visit [email or web address]. Please follow instruction sheet, as given.  Your physician has requested that you have an echocardiogram. Echocardiography is a painless test that uses sound waves to create images of your heart. It provides your doctor with information about the size and shape of your heart and how well your heart's chambers and valves are working. This procedure takes approximately one hour. There are no restrictions for this procedure.  Your physician recommends that you schedule a follow-up appointment in: 1 month.   

## 2013-08-06 NOTE — Progress Notes (Signed)
Patient ID: [name], male   DOB: 1926/04/05, 80 y.o.   MRN: [id number] HPI:  Larry Lopez is a very pleasant 80 year old male with a history of a coronary artery disease status post redo bypass surgery in 2002 by Dr. Cyndia Lopez.[name] He also has a history of hypertension and hyperlipidemia, PAF complicated by  tachy-brady syndrome with significant resting bradycardia prohibiting AV-Nodal blockade. Was hospitalized in 6/10 with rapid AF.  He saw Dr. Caryl Lopez previously for consideration of pacemaker but it was thought that as AF is infrequent and self-limiting and his bradycardia is asymptomatic  that we would watch it for now and avoid AV-nodal blockers due to bradycardia. If AF burden increased would then consider pacing and re-insitutuion of AV-nodal blockers.  On 02/06/13 underwent DC-CV for recurrent AF but quickly reverted back to AF.   He returns for routine f/u. Over past 3-4 weeks notes more exertional fatigue and dyspnea. Denies CP but says he is aware of his chest more over that time. [facility] to [facility] doing [facility] and weights. No palpitations. Takes BP and pulse every am. Pulse typically ~70-80.  SBP usually 115-120. No bleeding with Eliquis.   Lab Results  Component Value Date   CHOL 155 04/06/2012   HDL 60.20 04/06/2012   LDLCALC 79 04/06/2012   TRIG 78.0 04/06/2012   CHOLHDL 3 04/06/2012      ROS: All systems negative except as listed in HPI, PMH and Problem List.  Past Medical History  Diagnosis Date  . CAD (coronary artery disease)   . HTN (hypertension)   . HLD (hyperlipidemia)   . Bradycardia   . Glaucoma   . Atrial fibrillation   . Tachy-brady syndrome     Current Outpatient Prescriptions  Medication Sig Dispense Refill  . amLODipine (NORVASC) 2.5 MG tablet take 1 tablet by mouth once daily  90 tablet  3  . aspirin 81 MG tablet Take 81 mg by mouth daily.        [name] atorvastatin (LIPITOR) 40 MG tablet Take 1 tablet (40 mg total) by mouth daily.  90 tablet  3  . Calcium  Carbonate (CALCIUM 600) 1500 MG TABS Take 1 tablet by mouth daily.       [name] ELIQUIS 5 MG TABS tablet TAKE ONE TABLET BY MOUTH TWICE DAILY  180 tablet  3  . hydrochlorothiazide (HYDRODIURIL) 25 MG tablet Take 1 tablet (25 mg total) by mouth daily.  90 tablet  3  . metoprolol tartrate (LOPRESSOR) 25 MG tablet Take 25 mg by mouth as needed. Only uses if needed for tachycardia. Has not needed in over a year.      . Multiple Vitamin (MULTIVITAMIN) tablet Take 1 tablet by mouth daily.       . nitroGLYCERIN (NITROSTAT) 0.4 MG SL tablet Place 0.4 mg under the tongue every 5 (five) minutes as needed for chest pain.       [name] timolol (BETIMOL) 0.5 % ophthalmic solution Place 1 drop into both eyes 2 (two) times daily.       No current facility-administered medications for this encounter.     PHYSICAL EXAM: Filed Vitals:   08/06/13 1512  BP: 120/70  Pulse: 91   General:  Well appearing. Thin. No resp difficulty HEENT: normal Neck: supple. JVP flat. Carotids 2+ bilaterally; no bruits. No lymphadenopathy or thryomegaly appreciated. Cor: PMI normal. Irregular rate & rhythm. No rubs, gallops or murmurs. Lungs: clear Abdomen: soft, nontender, nondistended. No hepatosplenomegaly. No bruits or masses. Good bowel  sounds. Extremities: no cyanosis, clubbing, rash, edema Neuro: alert & orientedx3, cranial nerves grossly intact. Moves all 4 extremities w/o difficulty. Affect pleasant.   ECG: AF 84  No ST-T wave abnormalities.    ASSESSMENT & PLAN:  1. Exertional dyspnea, progressive 2. Chronic AF 3. HTN 4. H/o tachy brady syndrome 5. CAD s/p re-do CABG 2002  He has had progressive exertional dyspnea and fatigue. No evidence of HF on exam. Has had chronic AF without change (failed DC-CV in 1/15). My concern is for recurrent symptomatic CAD. Will schedule Lexiscan myvoiew and echocardiogram. Come to ER if symptoms getting worse. Continue Eliquis. BP looks great.   Larry Monreal,MD 3:52 PM

## 2013-08-07 ENCOUNTER — Ambulatory Visit (HOSPITAL_COMMUNITY): Payer: Medicare Other | Attending: Cardiology | Admitting: Radiology

## 2013-08-07 VITALS — BP 137/74 | Ht 71.0 in | Wt 160.0 lb

## 2013-08-07 DIAGNOSIS — R0609 Other forms of dyspnea: Secondary | ICD-10-CM | POA: Insufficient documentation

## 2013-08-07 DIAGNOSIS — Z9861 Coronary angioplasty status: Secondary | ICD-10-CM | POA: Insufficient documentation

## 2013-08-07 DIAGNOSIS — R0989 Other specified symptoms and signs involving the circulatory and respiratory systems: Principal | ICD-10-CM | POA: Insufficient documentation

## 2013-08-07 DIAGNOSIS — Z87891 Personal history of nicotine dependence: Secondary | ICD-10-CM | POA: Diagnosis not present

## 2013-08-07 DIAGNOSIS — R5381 Other malaise: Secondary | ICD-10-CM | POA: Diagnosis not present

## 2013-08-07 DIAGNOSIS — I2510 Atherosclerotic heart disease of native coronary artery without angina pectoris: Secondary | ICD-10-CM | POA: Insufficient documentation

## 2013-08-07 DIAGNOSIS — E785 Hyperlipidemia, unspecified: Secondary | ICD-10-CM | POA: Diagnosis not present

## 2013-08-07 DIAGNOSIS — Z951 Presence of aortocoronary bypass graft: Secondary | ICD-10-CM | POA: Diagnosis not present

## 2013-08-07 DIAGNOSIS — R5383 Other fatigue: Secondary | ICD-10-CM

## 2013-08-07 DIAGNOSIS — I4891 Unspecified atrial fibrillation: Secondary | ICD-10-CM | POA: Insufficient documentation

## 2013-08-07 DIAGNOSIS — R002 Palpitations: Secondary | ICD-10-CM | POA: Diagnosis not present

## 2013-08-07 DIAGNOSIS — I10 Essential (primary) hypertension: Secondary | ICD-10-CM | POA: Diagnosis not present

## 2013-08-07 MED ORDER — TECHNETIUM TC 99M SESTAMIBI GENERIC - CARDIOLITE
33.0000 | Freq: Once | INTRAVENOUS | Status: AC | PRN
Start: 2013-08-07 — End: 2013-08-07
  Administered 2013-08-07: 33 via INTRAVENOUS

## 2013-08-07 MED ORDER — TECHNETIUM TC 99M SESTAMIBI GENERIC - CARDIOLITE
11.0000 | Freq: Once | INTRAVENOUS | Status: AC | PRN
Start: 2013-08-07 — End: 2013-08-07
  Administered 2013-08-07: 11 via INTRAVENOUS

## 2013-08-07 MED ORDER — REGADENOSON 0.4 MG/5ML IV SOLN
0.4000 mg | Freq: Once | INTRAVENOUS | Status: AC
  Administered 2013-08-07: 0.4 mg via INTRAVENOUS

## 2013-08-07 NOTE — Progress Notes (Signed)
[facility] [address] [place], [place] [address] [phone]    Cardiology Nuclear Med Study  Larry Lopez is a 80 y.o. male     MRN : [id number]     DOB: [birth year]  Procedure Date: 08/07/2013  Nuclear Med Background Indication for Stress Test:  Evaluation for Ischemia, Graft Patency History:  CAD-'83/'01 CABG x2-STENT-'09 ECHO: EF: 55-60%-'11 GXT-NL,01/15 AFIB  Cardiac Risk Factors: History of Smoking, Hypertension and Lipids  Symptoms:  DOE, Fatigue and Palpitations   Nuclear Pre-Procedure Caffeine/Decaff Intake:  None NPO After: 6:30 pm   Lungs:  clear O2 Sat: 98% on room air. IV 0.9% NS with Angio Cath:  22g  IV Site: R Antecubital  IV Started by:  Perrin Maltese, EMT-P  Chest Size (in):  42 Cup Size: n/a  Height: 5\' 11"  (1.803 m)  Weight:  160 lb (72.576 kg)  BMI:  Body mass index is 22.33 kg/(m^2). Tech Comments:  Rx this am    Nuclear Med Study 1 or 2 day study: 1 day  Stress Test Type:  Carlton Adam  Reading MD: Veatrice Bourbon MD  Order Authorizing Provider:  D. Bensimhon MD  Resting Radionuclide: Technetium 75m Sestamibi  Resting Radionuclide Dose: 11.0 mCi   Stress Radionuclide:  Technetium 36m Sestamibi  Stress Radionuclide Dose: 33.0 mCi           Stress Protocol Rest HR: 69 Stress HR: 88  Rest BP: 137/74 Stress BP: 199/111  Exercise Time (min): n/a METS: n/a   Predicted Max HR: 136 bpm % Max HR: 64.71 bpm Rate Pressure Product: 17512   Dose of Adenosine (mg):  n/a Dose of Lexiscan: 0.4 mg  Dose of Atropine (mg): n/a Dose of Dobutamine: n/a mcg/kg/min (at max HR)  Stress Test Technologist: Perrin Maltese, EMT-P  Nuclear Technologist:  Vedia Pereyra, CNMT     Rest Procedure:  Myocardial perfusion imaging was performed at rest 45 minutes following the intravenous administration of Technetium 6m Sestamibi. Rest ECG: Atrial fibrillation with controlled rate. Normal QRS.  Stress Procedure:  The patient received IV Lexiscan  0.4 mg over 15-seconds.  Technetium 17m Sestamibi injected at 30-seconds. This patient had no symptoms with the Lexiscan injection. Quantitative spect images were obtained after a 45 minute delay. Stress ECG: No significant change from baseline ECG  QPS Raw Data Images:  Patient motion noted. Stress Images:  Normal homogeneous uptake in all areas of the myocardium. Rest Images:  Normal homogeneous uptake in all areas of the myocardium. Subtraction (SDS):  No evidence of ischemia. Transient Ischemic Dilatation (Normal <1.22):  1.06 Lung/Heart Ratio (Normal <0.45):  0.36  Quantitative Gated Spect Images QGS EDV:  87 ml QGS ESV:  38 ml  Impression Exercise Capacity:  Lexiscan with no exercise. BP Response:  Hypertensive blood pressure response. Clinical Symptoms:  No significant symptoms noted. ECG Impression:  No significant ST segment change suggestive of ischemia. Comparison with Prior Nuclear Study: No images to compare  Overall Impression:  Normal stress nuclear study. This is a low risk scan. There is no scar or ischemia.  LV Ejection Fraction: 56%.  LV Wall Motion:  Normal Wall Motion.  [name], MD

## 2013-08-07 NOTE — Addendum Note (Signed)
Encounter addended by: Vanessa Barbara, CCT on: 08/07/2013  8:17 AM<BR>     Documentation filed: Charges VN

## 2013-08-08 ENCOUNTER — Ambulatory Visit (HOSPITAL_COMMUNITY)
Admission: RE | Admit: 2013-08-08 | Discharge: 2013-08-08 | Disposition: A | Discharge status: Home / Self Care | Payer: Medicare Other | Source: Ambulatory Visit | Attending: Family Medicine | Admitting: Family Medicine

## 2013-08-08 DIAGNOSIS — I369 Nonrheumatic tricuspid valve disorder, unspecified: Secondary | ICD-10-CM

## 2013-08-08 DIAGNOSIS — E785 Hyperlipidemia, unspecified: Secondary | ICD-10-CM | POA: Diagnosis not present

## 2013-08-08 DIAGNOSIS — R0609 Other forms of dyspnea: Secondary | ICD-10-CM | POA: Insufficient documentation

## 2013-08-08 DIAGNOSIS — R0989 Other specified symptoms and signs involving the circulatory and respiratory systems: Secondary | ICD-10-CM | POA: Diagnosis not present

## 2013-08-08 DIAGNOSIS — I10 Essential (primary) hypertension: Secondary | ICD-10-CM | POA: Insufficient documentation

## 2013-08-08 DIAGNOSIS — I4891 Unspecified atrial fibrillation: Secondary | ICD-10-CM | POA: Insufficient documentation

## 2013-08-08 DIAGNOSIS — I2510 Atherosclerotic heart disease of native coronary artery without angina pectoris: Secondary | ICD-10-CM | POA: Diagnosis not present

## 2013-08-09 ENCOUNTER — Encounter: Payer: Self-pay | Admitting: Internal Medicine

## 2013-08-09 ENCOUNTER — Encounter: Payer: Self-pay | Admitting: Cardiology

## 2013-08-13 ENCOUNTER — Telehealth (HOSPITAL_COMMUNITY): Payer: Self-pay | Admitting: *Deleted

## 2013-08-13 NOTE — Telephone Encounter (Signed)
Pt given myoview and echo results

## 2013-08-27 ENCOUNTER — Ambulatory Visit (HOSPITAL_COMMUNITY): Payer: Medicare Other

## 2013-09-06 ENCOUNTER — Ambulatory Visit (HOSPITAL_COMMUNITY)
Admission: RE | Admit: 2013-09-06 | Discharge: 2013-09-06 | Disposition: A | Discharge status: Home / Self Care | Payer: Medicare Other | Source: Ambulatory Visit | Attending: Cardiology | Admitting: Cardiology

## 2013-09-06 ENCOUNTER — Encounter (HOSPITAL_COMMUNITY): Payer: Self-pay

## 2013-09-06 VITALS — BP 121/72 | HR 92 | Resp 18 | Wt 160.0 lb

## 2013-09-06 DIAGNOSIS — I10 Essential (primary) hypertension: Secondary | ICD-10-CM | POA: Diagnosis not present

## 2013-09-06 DIAGNOSIS — R0609 Other forms of dyspnea: Secondary | ICD-10-CM | POA: Diagnosis not present

## 2013-09-06 DIAGNOSIS — I482 Chronic atrial fibrillation, unspecified: Secondary | ICD-10-CM

## 2013-09-06 DIAGNOSIS — I4891 Unspecified atrial fibrillation: Secondary | ICD-10-CM | POA: Diagnosis not present

## 2013-09-06 DIAGNOSIS — Z951 Presence of aortocoronary bypass graft: Secondary | ICD-10-CM | POA: Diagnosis not present

## 2013-09-06 DIAGNOSIS — I495 Sick sinus syndrome: Secondary | ICD-10-CM | POA: Diagnosis not present

## 2013-09-06 DIAGNOSIS — R0989 Other specified symptoms and signs involving the circulatory and respiratory systems: Principal | ICD-10-CM | POA: Insufficient documentation

## 2013-09-06 DIAGNOSIS — I2510 Atherosclerotic heart disease of native coronary artery without angina pectoris: Secondary | ICD-10-CM | POA: Insufficient documentation

## 2013-09-06 DIAGNOSIS — E785 Hyperlipidemia, unspecified: Secondary | ICD-10-CM | POA: Insufficient documentation

## 2013-09-06 NOTE — Addendum Note (Signed)
Encounter addended by: Kerry Dory, CMA on: 09/06/2013  2:43 PM<BR>     Documentation filed: Patient Instructions Section

## 2013-09-06 NOTE — Progress Notes (Signed)
Patient ID: Larry Lopez, male   DOB: May 07, 1926, 80 y.o.   MRN: [id number] HPI:  Larry Lopez is a very pleasant 80 year old male with a history of a coronary artery disease status post redo bypass surgery in 2002 by Dr. Cyndia Bent.[name] He also has a history of hypertension and hyperlipidemia, PAF complicated by  tachy-brady syndrome with significant resting bradycardia prohibiting AV-Nodal blockade. Was hospitalized in 6/10 with rapid AF.  He saw Dr. Caryl Comes previously for consideration of pacemaker but it was thought that as AF is infrequent and self-limiting and his bradycardia is asymptomatic  that we would watch it for now and avoid AV-nodal blockers due to bradycardia. If AF burden increased would then consider pacing and re-insitutuion of AV-nodal blockers.  On 02/06/13 underwent DC-CV for recurrent AF but quickly reverted back to AF.   He returns for routine f/u. At last visit, was having worsening SOB. He had Lexiscan Myoview 08/08/13 EF 56% no ischemia noted. Echo read as 40-45% (I reviewed personally and EF 50-55%. Feels much better. No CP or SOB. Continues to [facility] routinely at [facility] 1-2x/week. Doing weights at home every day.    Lab Results  Component Value Date   CHOL 155 04/06/2012   HDL 60.20 04/06/2012   LDLCALC 79 04/06/2012   TRIG 78.0 04/06/2012   CHOLHDL 3 04/06/2012      ROS: All systems negative except as listed in HPI, PMH and Problem List.  Past Medical History  Diagnosis Date  . CAD (coronary artery disease)   . HTN (hypertension)   . HLD (hyperlipidemia)   . Bradycardia   . Glaucoma   . Atrial fibrillation   . Tachy-brady syndrome     Current Outpatient Prescriptions  Medication Sig Dispense Refill  . amLODipine (NORVASC) 2.5 MG tablet take 1 tablet by mouth once daily  90 tablet  3  . aspirin 81 MG tablet Take 81 mg by mouth daily.        [name] atorvastatin (LIPITOR) 40 MG tablet Take 1 tablet (40 mg total) by mouth daily.  90 tablet  3  . Calcium Carbonate (CALCIUM 600) 1500  MG TABS Take 1 tablet by mouth daily.       [name] ELIQUIS 5 MG TABS tablet TAKE ONE TABLET BY MOUTH TWICE DAILY  180 tablet  3  . hydrochlorothiazide (HYDRODIURIL) 25 MG tablet Take 1 tablet (25 mg total) by mouth daily.  90 tablet  3  . metoprolol tartrate (LOPRESSOR) 25 MG tablet Take 25 mg by mouth as needed. Only uses if needed for tachycardia. Has not needed in over a year.      . Multiple Vitamin (MULTIVITAMIN) tablet Take 1 tablet by mouth daily.       . nitroGLYCERIN (NITROSTAT) 0.4 MG SL tablet Place 0.4 mg under the tongue every 5 (five) minutes as needed for chest pain.       [name] timolol (BETIMOL) 0.5 % ophthalmic solution Place 1 drop into both eyes 2 (two) times daily.       No current facility-administered medications for this encounter.     PHYSICAL EXAM: Filed Vitals:   09/06/13 1346  BP: 121/72  Pulse: 92  Resp: 18   General:  Well appearing. Thin. No resp difficulty HEENT: normal Neck: supple. JVP flat. Carotids 2+ bilaterally; no bruits. No lymphadenopathy or thryomegaly appreciated. Cor: PMI normal. Irregular rate & rhythm. No rubs, gallops or murmurs. Lungs: clear Abdomen: soft, nontender, nondistended. No hepatosplenomegaly. No bruits or masses. Good bowel sounds.  Extremities: no cyanosis, clubbing, rash, edema Neuro: alert & orientedx3, cranial nerves grossly intact. Moves all 4 extremities w/o difficulty. Affect pleasant.   ECG: AF 69  No ST-T wave abnormalities.    ASSESSMENT & PLAN:  1. Exertional dyspnea, resolved 2. Chronic AF on Apixaban 3. HTN 4. H/o tachy brady syndrome 5. CAD s/p re-do CABG 2002  He is much improved. Echo and Myoview reviewed with him. Wil continue medical therapy. Will see back in 2 months for close f/u.  Benay Spice 2:38 PM

## 2013-09-06 NOTE — Patient Instructions (Signed)
Your physician recommends that you schedule a follow-up appointment in: 2 months  Do the following things EVERYDAY: 1) Weigh yourself in the morning before breakfast. Write it down and keep it in a log. 2) Take your medicines as prescribed 3) Eat low salt foods-Limit salt (sodium) to 2000 mg per day.  4) Stay as active as you can everyday 5) Limit all fluids for the day to less than 2 liters 6)   

## 2013-10-22 DIAGNOSIS — Z23 Encounter for immunization: Secondary | ICD-10-CM | POA: Diagnosis not present

## 2013-10-26 ENCOUNTER — Other Ambulatory Visit: Payer: Self-pay

## 2013-11-01 DIAGNOSIS — H4010X2 Unspecified open-angle glaucoma, moderate stage: Secondary | ICD-10-CM | POA: Diagnosis not present

## 2013-11-01 DIAGNOSIS — H40243 Residual stage of angle-closure glaucoma, bilateral: Secondary | ICD-10-CM | POA: Diagnosis not present

## 2013-11-05 ENCOUNTER — Ambulatory Visit (HOSPITAL_COMMUNITY)
Admission: RE | Admit: 2013-11-05 | Discharge: 2013-11-05 | Disposition: A | Discharge status: Home / Self Care | Payer: Medicare Other | Source: Ambulatory Visit | Attending: Cardiology | Admitting: Cardiology

## 2013-11-05 VITALS — BP 116/64 | HR 74 | Wt 158.5 lb

## 2013-11-05 DIAGNOSIS — Z7901 Long term (current) use of anticoagulants: Secondary | ICD-10-CM | POA: Diagnosis not present

## 2013-11-05 DIAGNOSIS — Z951 Presence of aortocoronary bypass graft: Secondary | ICD-10-CM | POA: Diagnosis not present

## 2013-11-05 DIAGNOSIS — R2689 Other abnormalities of gait and mobility: Secondary | ICD-10-CM | POA: Insufficient documentation

## 2013-11-05 DIAGNOSIS — E785 Hyperlipidemia, unspecified: Secondary | ICD-10-CM | POA: Diagnosis not present

## 2013-11-05 DIAGNOSIS — Z7982 Long term (current) use of aspirin: Secondary | ICD-10-CM | POA: Diagnosis not present

## 2013-11-05 DIAGNOSIS — I10 Essential (primary) hypertension: Secondary | ICD-10-CM | POA: Diagnosis not present

## 2013-11-05 DIAGNOSIS — I2510 Atherosclerotic heart disease of native coronary artery without angina pectoris: Secondary | ICD-10-CM | POA: Diagnosis not present

## 2013-11-05 DIAGNOSIS — I495 Sick sinus syndrome: Secondary | ICD-10-CM | POA: Diagnosis not present

## 2013-11-05 DIAGNOSIS — I480 Paroxysmal atrial fibrillation: Secondary | ICD-10-CM | POA: Insufficient documentation

## 2013-11-05 DIAGNOSIS — I4891 Unspecified atrial fibrillation: Secondary | ICD-10-CM | POA: Diagnosis not present

## 2013-11-05 NOTE — Patient Instructions (Signed)
Stop Amlodipine  You have been referred to Neuro Therapy, Wed 10/28 at 8:45 at [facility]   We will contact you in 4 months to schedule your next appointment.

## 2013-11-05 NOTE — Addendum Note (Signed)
Encounter addended by: Scarlette Calico, RN on: 11/05/2013  2:41 PM<BR>     Documentation filed: Medications

## 2013-11-05 NOTE — Addendum Note (Signed)
Encounter addended by: Scarlette Calico, RN on: 11/05/2013  2:40 PM<BR>     Documentation filed: Patient Instructions Section

## 2013-11-05 NOTE — Progress Notes (Signed)
Patient ID: Larry Lopez, male   DOB: [birth year], 80 y.o.   MRN: [id number]  HPI:  Larry Lopez is a very pleasant 80 year old male with a history of a coronary artery disease status post redo bypass surgery in 2002 by Dr. Cyndia Bent.[name] He also has a history of hypertension and hyperlipidemia, PAF complicated by  tachy-brady syndrome with significant resting bradycardia prohibiting AV-Nodal blockade. Was hospitalized in 6/10 with rapid AF.  He saw Dr. Caryl Comes previously for consideration of pacemaker but it was thought that as AF is infrequent and self-limiting and his bradycardia is asymptomatic  that we would watch it for now and avoid AV-nodal blockers due to bradycardia. If AF burden increased would then consider pacing and re-insitutuion of AV-nodal blockers.  On 02/06/13 underwent DC-CV for recurrent AF but quickly reverted back to AF.   He returns for routine f/u. Earlier this year was having worsening SOB. He had Lexiscan Myoview 08/08/13 EF 56% no ischemia noted. Echo read as 40-45% (I reviewed personally and EF 50-55%. Feels much better. No CP or SOB. Feels he is a bit more unsteady on his feet. Occasionally lightheaded. Takes BP regularly at home and SBP usually 110-130. Continues to [facility] routinely at [facility] 1-2x/week. Doing weights at home every day.    Lab Results  Component Value Date   CHOL 155 04/06/2012   HDL 60.20 04/06/2012   LDLCALC 79 04/06/2012   TRIG 78.0 04/06/2012   CHOLHDL 3 04/06/2012      ROS: All systems negative except as listed in HPI, PMH and Problem List.  Past Medical History  Diagnosis Date  . CAD (coronary artery disease)   . HTN (hypertension)   . HLD (hyperlipidemia)   . Bradycardia   . Glaucoma   . Atrial fibrillation   . Tachy-brady syndrome     Current Outpatient Prescriptions  Medication Sig Dispense Refill  . amLODipine (NORVASC) 2.5 MG tablet take 1 tablet by mouth once daily  90 tablet  3  . aspirin 81 MG tablet Take 81 mg by mouth daily.        [name]  atorvastatin (LIPITOR) 40 MG tablet Take 1 tablet (40 mg total) by mouth daily.  90 tablet  3  . Calcium Carbonate (CALCIUM 600) 1500 MG TABS Take 1 tablet by mouth daily.       [name] ELIQUIS 5 MG TABS tablet TAKE ONE TABLET BY MOUTH TWICE DAILY  180 tablet  3  . hydrochlorothiazide (HYDRODIURIL) 25 MG tablet Take 1 tablet (25 mg total) by mouth daily.  90 tablet  3  . metoprolol tartrate (LOPRESSOR) 25 MG tablet Take 25 mg by mouth as needed. Only uses if needed for tachycardia. Has not needed in over a year.      . Multiple Vitamin (MULTIVITAMIN) tablet Take 1 tablet by mouth daily.       . nitroGLYCERIN (NITROSTAT) 0.4 MG SL tablet Place 0.4 mg under the tongue every 5 (five) minutes as needed for chest pain.       [name] timolol (BETIMOL) 0.5 % ophthalmic solution Place 1 drop into both eyes 2 (two) times daily.       No current facility-administered medications for this encounter.     PHYSICAL EXAM: Filed Vitals:   11/05/13 1352  BP: 116/64  Pulse: 74   General:  Well appearing. Thin. No resp difficulty HEENT: normal Neck: supple. JVP flat. Carotids 2+ bilaterally; no bruits. No lymphadenopathy or thryomegaly appreciated. Cor: PMI normal. Irregular rate & rhythm. No  rubs, gallops or murmurs. Lungs: clear Abdomen: soft, nontender, nondistended. No hepatosplenomegaly. No bruits or masses. Good bowel sounds. Extremities: no cyanosis, clubbing, rash, edema Neuro: alert & orientedx3, cranial nerves grossly intact. Moves all 4 extremities w/o difficulty. Affect pleasant. Normal Romberg. Normal FTN. Normal tandem gait. Do dysdiadochokinesis   ECG: AF 72  No ST-T wave abnormalities.    ASSESSMENT & PLAN:  1. Exertional dyspnea, resolved 2. Chronic AF on Apixaban 5 bid (may need to reduce dose if weight goes lower or renal function changes) 3. HTN 4. H/o tachy brady syndrome 5. CAD s/p re-do CABG 2002  He is much improved. Echo and Myoview reviewed with him. He has some unsteadiness on his  feet. BP is soft. Will stop amlodipine and follow BP closely. Can stop HCTZ if needed. Will send to PT for balance/strength evaluation.  Lira Stephen,MD 2:03 PM

## 2013-11-06 NOTE — Addendum Note (Signed)
Encounter addended by: Vanessa Barbara, CCT on: 11/06/2013  8:43 AM<BR>     Documentation filed: Charges VN

## 2013-11-07 ENCOUNTER — Ambulatory Visit: Payer: Medicare Other | Attending: Internal Medicine

## 2013-11-07 DIAGNOSIS — Q958 Other balanced rearrangements and structural markers: Secondary | ICD-10-CM | POA: Diagnosis not present

## 2013-11-07 DIAGNOSIS — R279 Unspecified lack of coordination: Secondary | ICD-10-CM | POA: Insufficient documentation

## 2013-11-07 DIAGNOSIS — Z5189 Encounter for other specified aftercare: Secondary | ICD-10-CM | POA: Insufficient documentation

## 2013-12-13 ENCOUNTER — Encounter: Payer: Self-pay | Admitting: Internal Medicine

## 2013-12-13 ENCOUNTER — Ambulatory Visit (INDEPENDENT_AMBULATORY_CARE_PROVIDER_SITE_OTHER): Payer: Medicare Other | Admitting: Internal Medicine

## 2013-12-13 VITALS — BP 118/74 | HR 78 | Temp 98.3°F | Resp 18 | Ht 71.0 in | Wt 157.2 lb

## 2013-12-13 DIAGNOSIS — I10 Essential (primary) hypertension: Secondary | ICD-10-CM

## 2013-12-13 DIAGNOSIS — R2689 Other abnormalities of gait and mobility: Secondary | ICD-10-CM | POA: Diagnosis not present

## 2013-12-13 DIAGNOSIS — I4891 Unspecified atrial fibrillation: Secondary | ICD-10-CM

## 2013-12-13 DIAGNOSIS — I2510 Atherosclerotic heart disease of native coronary artery without angina pectoris: Secondary | ICD-10-CM

## 2013-12-13 NOTE — Patient Instructions (Signed)
We do not need to check your blood work today. You are doing great with her health.  We will see you back next year for your physical. If you have any new problems or questions before then please feel free to call our office.

## 2013-12-13 NOTE — Progress Notes (Signed)
Pre visit review using our clinic review tool, if applicable. No additional management support is needed unless otherwise documented below in the visit note. 

## 2013-12-14 NOTE — Progress Notes (Signed)
   Subjective:    Patient ID: Larry Lopez, male    DOB: September 17, 1926, 80 y.o.   MRN: [id number]  HPI The patient is an 80 year old man who comes in today to establish care. He does have past medical history of hypertension, CAD, atrial fibrillation. He has had some problems with imbalance and it was thought that his blood pressure was getting too low. Some of his blood pressure medication was stopped and he has had less problems with that. He denies any recent falls at home. He also denies chest pains, shortness of breath, abdominal pain.  Review of Systems  Constitutional: Negative for fever, activity change, appetite change, fatigue and unexpected weight change.  HENT: Negative.   Respiratory: Negative for cough, chest tightness, shortness of breath and wheezing.   Cardiovascular: Negative for chest pain, palpitations and leg swelling.  Gastrointestinal: Negative for abdominal pain, diarrhea, constipation and abdominal distention.  Musculoskeletal: Negative.   Neurological:       Imbalance      Objective:   Physical Exam  Constitutional: He is oriented to person, place, and time. He appears well-developed and well-nourished.  HENT:  Head: Normocephalic and atraumatic.  Eyes: EOM are normal.  Neck: Normal range of motion.  Cardiovascular: Normal rate and regular rhythm.   Pulmonary/Chest: Effort normal. No respiratory distress. He has no wheezes. He has no rales.  Abdominal: Soft. Bowel sounds are normal. He exhibits no distension. There is no tenderness. There is no rebound.  Neurological: He is alert and oriented to person, place, and time.   Filed Vitals:   12/13/13 1356  BP: 118/74  Pulse: 78  Temp: 98.3 F (36.8 C)  TempSrc: Oral  Resp: 18  Height: 5\' 11"  (1.803 m)  Weight: 157 lb 3.2 oz (71.305 kg)  SpO2: 98%      Assessment & Plan:

## 2013-12-14 NOTE — Assessment & Plan Note (Signed)
If he continues to have problems office blood pressure medication may be related to his timolol eyedrops.

## 2013-12-14 NOTE — Assessment & Plan Note (Signed)
He takes beta blocker, anticoagulant.

## 2013-12-14 NOTE — Assessment & Plan Note (Signed)
BP still mildly low to normal on exam today. No orthostatic hypotension.

## 2013-12-17 ENCOUNTER — Other Ambulatory Visit (HOSPITAL_COMMUNITY): Payer: Self-pay | Admitting: Surgery

## 2013-12-17 MED ORDER — APIXABAN 5 MG PO TABS: 5.0000 mg | ORAL_TABLET | Freq: Two times a day (BID) | ORAL | Status: DC

## 2014-03-14 DIAGNOSIS — H01001 Unspecified blepharitis right upper eyelid: Secondary | ICD-10-CM | POA: Diagnosis not present

## 2014-03-14 DIAGNOSIS — H01004 Unspecified blepharitis left upper eyelid: Secondary | ICD-10-CM | POA: Diagnosis not present

## 2014-03-14 DIAGNOSIS — H40243 Residual stage of angle-closure glaucoma, bilateral: Secondary | ICD-10-CM | POA: Diagnosis not present

## 2014-03-14 DIAGNOSIS — H4010X2 Unspecified open-angle glaucoma, moderate stage: Secondary | ICD-10-CM | POA: Diagnosis not present

## 2014-03-26 ENCOUNTER — Other Ambulatory Visit: Payer: Self-pay

## 2014-03-26 MED ORDER — ATORVASTATIN CALCIUM 40 MG PO TABS: 40.0000 mg | ORAL_TABLET | Freq: Every day | ORAL | Status: DC

## 2014-04-03 DIAGNOSIS — H6121 Impacted cerumen, right ear: Secondary | ICD-10-CM | POA: Diagnosis not present

## 2014-04-12 ENCOUNTER — Other Ambulatory Visit (HOSPITAL_COMMUNITY): Payer: Self-pay | Admitting: Internal Medicine

## 2014-04-15 ENCOUNTER — Other Ambulatory Visit (HOSPITAL_COMMUNITY): Payer: Self-pay | Admitting: *Deleted

## 2014-04-15 MED ORDER — APIXABAN 5 MG PO TABS: 5.0000 mg | ORAL_TABLET | Freq: Two times a day (BID) | ORAL | Status: DC

## 2014-05-27 ENCOUNTER — Other Ambulatory Visit (HOSPITAL_COMMUNITY): Payer: Self-pay | Admitting: Internal Medicine

## 2014-05-28 ENCOUNTER — Other Ambulatory Visit (HOSPITAL_COMMUNITY): Payer: Self-pay | Admitting: Internal Medicine

## 2014-06-11 ENCOUNTER — Telehealth (HOSPITAL_COMMUNITY): Payer: Self-pay | Admitting: Vascular Surgery

## 2014-06-11 MED ORDER — METOPROLOL TARTRATE 25 MG PO TABS
25.0000 mg | ORAL_TABLET | ORAL | Status: DC | PRN
Start: 2014-06-11 — End: 2015-04-08

## 2014-06-11 NOTE — Telephone Encounter (Signed)
Refill Metoprolol

## 2014-06-19 ENCOUNTER — Encounter (HOSPITAL_COMMUNITY): Payer: Self-pay

## 2014-06-19 ENCOUNTER — Ambulatory Visit (HOSPITAL_COMMUNITY)
Admission: RE | Admit: 2014-06-19 | Discharge: 2014-06-19 | Disposition: A | Discharge status: Home / Self Care | Payer: Medicare Other | Source: Ambulatory Visit | Attending: Internal Medicine | Admitting: Internal Medicine

## 2014-06-19 VITALS — BP 112/68 | HR 77 | Wt 158.5 lb

## 2014-06-19 DIAGNOSIS — R0602 Shortness of breath: Secondary | ICD-10-CM

## 2014-06-19 DIAGNOSIS — I495 Sick sinus syndrome: Secondary | ICD-10-CM | POA: Diagnosis not present

## 2014-06-19 DIAGNOSIS — I2583 Coronary atherosclerosis due to lipid rich plaque: Secondary | ICD-10-CM

## 2014-06-19 DIAGNOSIS — I482 Chronic atrial fibrillation, unspecified: Secondary | ICD-10-CM

## 2014-06-19 DIAGNOSIS — E785 Hyperlipidemia, unspecified: Secondary | ICD-10-CM | POA: Insufficient documentation

## 2014-06-19 DIAGNOSIS — Z7902 Long term (current) use of antithrombotics/antiplatelets: Secondary | ICD-10-CM | POA: Insufficient documentation

## 2014-06-19 DIAGNOSIS — R0609 Other forms of dyspnea: Secondary | ICD-10-CM | POA: Diagnosis not present

## 2014-06-19 DIAGNOSIS — Z79899 Other long term (current) drug therapy: Secondary | ICD-10-CM | POA: Diagnosis not present

## 2014-06-19 DIAGNOSIS — I10 Essential (primary) hypertension: Secondary | ICD-10-CM | POA: Insufficient documentation

## 2014-06-19 DIAGNOSIS — Z951 Presence of aortocoronary bypass graft: Secondary | ICD-10-CM | POA: Diagnosis not present

## 2014-06-19 DIAGNOSIS — I2510 Atherosclerotic heart disease of native coronary artery without angina pectoris: Secondary | ICD-10-CM | POA: Insufficient documentation

## 2014-06-19 DIAGNOSIS — R2689 Other abnormalities of gait and mobility: Secondary | ICD-10-CM

## 2014-06-19 DIAGNOSIS — Z7982 Long term (current) use of aspirin: Secondary | ICD-10-CM | POA: Insufficient documentation

## 2014-06-19 MED ORDER — LISINOPRIL 2.5 MG PO TABS: 2.5000 mg | ORAL_TABLET | Freq: Every day | ORAL | Status: DC

## 2014-06-19 NOTE — Progress Notes (Signed)
Patient ID: GRANITE GODMAN, male   DOB: Aug 31, 1926, 81 y.o.   MRN: [id number]  HPI:  Larry Lopez is a very pleasant 81 year old male with a history of a coronary artery disease status post redo bypass surgery in 2002 by Dr. Cyndia Bent.[name] He also has a history of hypertension and hyperlipidemia, PAF complicated by  tachy-brady syndrome with significant resting bradycardia prohibiting AV-Nodal blockade. Was hospitalized in 6/10 with rapid AF.  He saw Dr. Caryl Comes previously for consideration of pacemaker but it was thought that as AF is infrequent and self-limiting and his bradycardia is asymptomatic  that we would watch it for now and avoid AV-nodal blockers due to bradycardia. If AF burden increased would then consider pacing and re-insitutuion of AV-nodal blockers.  On 02/06/13 underwent DC-CV for recurrent AF but quickly reverted back to AF.   He had Lexiscan Myoview 08/08/13 EF 56% Echo read as 40-45% (I reviewed personally and EF 50-55%.  He returns for routine f/u. Feels pretty good. Still having problems with his balance. Also notes that legs feel odd. Says he is aware of them but not truly having pain or tingling. Continues to [facility] routinely at [facility] 1-2x/week doing Pilates and planks. Feels pretty good but perhaps slightly more SOB. Checks BP at home SBP usually 110-130. Doing weights at home every day. Wife says he is shuffling his feet more.    Lab Results  Component Value Date   CHOL 155 04/06/2012   HDL 60.20 04/06/2012   LDLCALC 79 04/06/2012   TRIG 78.0 04/06/2012   CHOLHDL 3 04/06/2012      ROS: All systems negative except as listed in HPI, PMH and Problem List.  Past Medical History  Diagnosis Date  . CAD (coronary artery disease)   . HTN (hypertension)   . HLD (hyperlipidemia)   . Bradycardia   . Glaucoma   . Atrial fibrillation   . Tachy-brady syndrome     Current Outpatient Prescriptions  Medication Sig Dispense Refill  . apixaban (ELIQUIS) 5 MG TABS tablet Take 1 tablet (5  mg total) by mouth 2 (two) times daily. 180 tablet 2  . aspirin 81 MG tablet Take 81 mg by mouth daily.      [name] atorvastatin (LIPITOR) 40 MG tablet Take 1 tablet (40 mg total) by mouth daily. 90 tablet 0  . Calcium Carbonate (CALCIUM 600) 1500 MG TABS Take 1 tablet by mouth daily.     . hydrochlorothiazide (HYDRODIURIL) 25 MG tablet TAKE 1 BY MOUTH DAILY 90 tablet 0  . metoprolol tartrate (LOPRESSOR) 25 MG tablet Take 1 tablet (25 mg total) by mouth as needed. Only uses if needed for tachycardia. Has not needed in over a year. 30 tablet 2  . Multiple Vitamin (MULTIVITAMIN) tablet Take 1 tablet by mouth daily.     . nitroGLYCERIN (NITROSTAT) 0.4 MG SL tablet Place 0.4 mg under the tongue every 5 (five) minutes as needed for chest pain.      No current facility-administered medications for this encounter.     PHYSICAL EXAM: Filed Vitals:   06/19/14 0943  BP: 112/68  Pulse: 77   General:  Well appearing. Thin. No resp difficulty HEENT: normal Neck: supple. JVP flat. Carotids 2+ bilaterally; no bruits. No lymphadenopathy or thryomegaly appreciated. Cor: PMI normal. Irregular rate & rhythm. No rubs, gallops or murmurs. Lungs: clear Abdomen: soft, nontender, nondistended. No hepatosplenomegaly. No bruits or masses. Good bowel sounds. Extremities: no cyanosis, clubbing, rash, edema Neuro: alert & orientedx3, cranial nerves grossly intact.  Moves all 4 extremities w/o difficulty. Affect pleasant. Normal Romberg. Normal FTN. Normal tandem gait. Do dysdiadochokinesis, No cogwheeling   ECG: AF 74  No ST-T wave abnormalities.    ASSESSMENT & PLAN:  1. Chronic AF on Apixaban 5 bid (may need to reduce dose if weight goes lower or renal function changes) 2. HTN - well controlled 3. H/o tachy brady syndrome stable 4. CAD s/p re-do CABG 2002 5. Hyperlipidemia - followed by PCP  He is stable. AF well controlled.  Main issue remains balance training. Will send to [facility] PT and also Neurology  for formal evaluation. Doing well with eliquis but goes into donut hole  Bensimhon, Daniel,MD 10:13 AM

## 2014-06-19 NOTE — Patient Instructions (Addendum)
Will refer you to [facility]rology for imbalance. Address: [address], [place], [place] [address]  [phone]  Follow up 6 months.  Do the following things EVERYDAY: 1) Weigh yourself in the morning before breakfast. Write it down and keep it in a log. 2) Take your medicines as prescribed 3) Eat low salt foods-Limit salt (sodium) to 2000 mg per day.  4) Stay as active as you can everyday 5) Limit all fluids for the day to less than 2 liters

## 2014-06-27 ENCOUNTER — Encounter: Payer: Self-pay | Admitting: Neurology

## 2014-06-27 ENCOUNTER — Other Ambulatory Visit (INDEPENDENT_AMBULATORY_CARE_PROVIDER_SITE_OTHER): Payer: Medicare Other

## 2014-06-27 ENCOUNTER — Ambulatory Visit (INDEPENDENT_AMBULATORY_CARE_PROVIDER_SITE_OTHER): Payer: Medicare Other | Admitting: Neurology

## 2014-06-27 VITALS — BP 120/64 | HR 88 | Ht 71.0 in | Wt 159.2 lb

## 2014-06-27 DIAGNOSIS — Z79899 Other long term (current) drug therapy: Secondary | ICD-10-CM

## 2014-06-27 DIAGNOSIS — I2510 Atherosclerotic heart disease of native coronary artery without angina pectoris: Secondary | ICD-10-CM

## 2014-06-27 DIAGNOSIS — R278 Other lack of coordination: Secondary | ICD-10-CM | POA: Diagnosis not present

## 2014-06-27 DIAGNOSIS — G621 Alcoholic polyneuropathy: Secondary | ICD-10-CM

## 2014-06-27 LAB — SEDIMENTATION RATE: Sed Rate: 3 mm/hr (ref 0–22)

## 2014-06-27 LAB — VITAMIN B12: Vitamin B-12: 677 pg/mL (ref 211–911)

## 2014-06-27 NOTE — Progress Notes (Signed)
[facility] Clinic Note - Initial Visit   Date: 06/27/2014   Larry Lopez MRN: [id number] DOB: 01/19/1926   Dear Dr. Doug Sou:  Thank you for your kind referral of Teron Blais Varin for consultation of imbalance. Although his history is well known to you, please allow Larry Lopez to reiterate it for the purpose of our medical record. The patient was accompanied to the clinic by her who also provides collateral information.     History of Present Illness: Larry Lopez is a 81 y.o. right-handed Caucasian male with CAD s/p CABG with PCI with stent x 5, atrial fibrillation on Eliquis, hyperlipidemia, prior tobacco use, current alcohol use, and GERD presenting for evaluation of imbalance.    Starting in the end of 2015, he started having problems with his balance which has progressively worsened over the past 3 months.  He has not fallen, but endorses problems with unsteadiness.  His wife says that he has to hold onto the rails, which makes him more comfortable.  Denies any lightheadedness or weakness.  He endorses slowed movements, shuffling gait, voice has become softer, and endorses mild changes in his hand writing.  Mood is good.  Denies restless sensation of his legs and sleeps is great.  He denies constipation.  He has vivid dreams.  No tremors.    He is more aware of his lower legs such as that they are there, but don't hurt.  No numbness/tingling or back pain.   He drinks a bottle of wine daily for the last 15 years as he used to own a wine company.  He is a very successful retired Conservation officer, historic buildings.   Out-side paper records, electronic medical record, and images have been reviewed where available and summarized as:  MRA brain 12/21/2006: No evidence occlusions, stenoses, dissections, or aneurysms noted on images provided. Suggestion of an infundibulum at the origin of the right PCOM.   MRI brain wwo contrast 12/21/2006: 1. No evidence of acute ischemia.  2.  Nonspecific subcortical white matter changes supratentorially and in the pons most likely represent areas of small vessel disease possibly due to diabetes or hypertension with the less likely possibility of a demyelinating process or a vasculitis. Mild diffuse atrophy. 3. Prominence of the subarachnoid spaces overlying the cerebral convexities, and also below the tentorium cerebellum possibly also secondary to atrophy. 4. Mild mucosal thickening of the ethmoids and left frontal sinus.  Lab Results  Component Value Date   TSH 1.03 07/23/2008     Past Medical History  Diagnosis Date  . CAD (coronary artery disease)   . HTN (hypertension)   . HLD (hyperlipidemia)   . Bradycardia   . Glaucoma   . Atrial fibrillation   . Tachy-brady syndrome     Past Surgical History  Procedure Laterality Date  . Tonsillectomy and adenoidectomy    . Coronary artery bypass graft    . Cardiac surgery    . Cardioversion N/A 02/06/2013    Procedure: CARDIOVERSION;  Surgeon: Jolaine Artist, MD;  Location: [facility] ENDOSCOPY;  Service: Cardiovascular;  Laterality: N/A;     Medications:  Current Outpatient Prescriptions on File Prior to Visit  Medication Sig Dispense Refill  . apixaban (ELIQUIS) 5 MG TABS tablet Take 1 tablet (5 mg total) by mouth 2 (two) times daily. 180 tablet 2  . aspirin 81 MG tablet Take 81 mg by mouth daily.      [name] atorvastatin (LIPITOR) 40 MG tablet Take 1 tablet (40 mg total) by  mouth daily. 90 tablet 0  . Calcium Carbonate (CALCIUM 600) 1500 MG TABS Take 1 tablet by mouth daily.     . hydrochlorothiazide (HYDRODIURIL) 25 MG tablet TAKE 1 BY MOUTH DAILY 90 tablet 0  . metoprolol tartrate (LOPRESSOR) 25 MG tablet Take 1 tablet (25 mg total) by mouth as needed. Only uses if needed for tachycardia. Has not needed in over a year. 30 tablet 2  . Multiple Vitamin (MULTIVITAMIN) tablet Take 1 tablet by mouth daily.     . nitroGLYCERIN (NITROSTAT) 0.4 MG SL tablet Place 0.4 mg under the  tongue every 5 (five) minutes as needed for chest pain.      No current facility-administered medications on file prior to visit.    Allergies: No Known Allergies  Family History: Family History  Problem Relation Age of Onset  . Cancer Father     Deceased, 50  . Diabetes Neg Hx   . Coronary artery disease Neg Hx   . Breast cancer Mother     Deceased, 30  . Breast cancer Sister   . Colon cancer Daughter     Living    Social History: History   Social History  . Marital Status: Married    Spouse Name: N/A  . Number of Children: 2  . Years of Education: N/A   Occupational History  . accountant    Social History Main Topics  . Smoking status: Former Smoker    Quit date: 01/12/1967  . Smokeless tobacco: Never Used  . Alcohol Use: 16.8 oz/week    28 Standard drinks or equivalent per week     Comment: 1 bottle of wine daily  . Drug Use: No  . Sexual Activity: Yes   Other Topics Concern  . Not on file   Social History Narrative   HSG, [place]. 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 Has weathered the recession OK with some adjustments. Advanced Care planning - patient does not want cardiac resuscitation - provided a signed "out of facility" order for home display (Aug '12) and did review the MOST form, providing an unsigned copy to be used for home discussion with report back.              Review of Systems:  CONSTITUTIONAL: No fevers, chills, night sweats, or weight loss.   EYES: No visual changes or eye pain ENT: No hearing changes.  No history of nose bleeds.   RESPIRATORY: No cough, wheezing and shortness of breath.   CARDIOVASCULAR: Negative for chest pain, and palpitations.   GI: Negative for abdominal discomfort, blood in stools or black stools.  No recent change in bowel habits.   GU:  No history of  incontinence.   MUSCLOSKELETAL: No history of joint pain or swelling.  No myalgias.   SKIN: Negative for lesions, rash, and itching.   HEMATOLOGY/ONCOLOGY: Negative for prolonged bleeding, bruising easily, and swollen nodes.  No history of cancer.   ENDOCRINE: Negative for cold or heat intolerance, polydipsia or goiter.   PSYCH:  No depression or anxiety symptoms.   NEURO: As Above.   Vital Signs:  BP 120/64 mmHg  Pulse 88  Ht 5\' 11"  (1.803 m)  Wt 159 lb 3 oz (72.207 kg)  BMI 22.21 kg/m2  SpO2 98%   General Medical Exam:   General:  Well appearing, comfortable.   Eyes/ENT: see  cranial nerve examination.   Neck: No masses appreciated.  Full range of motion without tenderness.  No carotid bruits. Respiratory:  Clear to auscultation, good air entry bilaterally.   Cardiac:  Irregularly irregular   Extremities:  No deformities, edema, or skin discoloration.  Skin:  No rashes or lesions.  Neurological Exam: MENTAL STATUS including orientation to time, place, person, recent and remote memory, attention span and concentration, language, and fund of knowledge is normal.  Speech is not dysarthric.  CRANIAL NERVES: II:  No visual field defects.  Unremarkable fundi.   III-IV-VI: Pupils equal round and reactive to light.  Normal conjugate, extra-ocular eye movements in all directions of gaze.  No nystagmus.  No ptosis.   V:  Normal facial sensation.     VII:  Normal facial symmetry and movements.  No pathologic facial reflexes.  VIII:  Normal hearing and vestibular function.   IX-X:  Normal palatal movement.   XI:  Normal shoulder shrug and head rotation.   XII:  Normal tongue strength and range of motion, no deviation or fasciculation.  MOTOR:  No atrophy, fasciculations or abnormal movements.  No pronator drift.  Tone is normal.    Right Upper Extremity:    Left Upper Extremity:    Deltoid  5/5   Deltoid  5/5   Biceps  5/5   Biceps  5/5   Triceps  5/5   Triceps  5/5   Wrist  extensors  5/5   Wrist extensors  5/5   Wrist flexors  5/5   Wrist flexors  5/5   Finger extensors  5/5   Finger extensors  5/5   Finger flexors  5/5   Finger flexors  5/5   Dorsal interossei  5/5   Dorsal interossei  5/5   Abductor pollicis  5/5   Abductor pollicis  5/5   Tone (Ashworth scale)  0  Tone (Ashworth scale)  0   Right Lower Extremity:    Left Lower Extremity:    Hip flexors  5/5   Hip flexors  5/5   Hip extensors  5/5   Hip extensors  5/5   Knee flexors  5/5   Knee flexors  5/5   Knee extensors  5/5   Knee extensors  5/5   Dorsiflexors  5/5   Dorsiflexors  5/5   Plantarflexors  5/5   Plantarflexors  5/5   Toe extensors  5/5   Toe extensors  5/5   Toe flexors  5/5   Toe flexors  5/5   Tone (Ashworth scale)  0  Tone (Ashworth scale)  0   MSRs:  Right                                                                 Left brachioradialis 2+  brachioradialis 2+  biceps 2+  biceps 2+  triceps 2+  triceps 2+  patellar 0  Patellar 0  ankle jerk 0  ankle jerk 0  Hoffman no  Hoffman no  plantar response down  plantar response down   SENSORY:  Vibration diminished to 20% at knees and absent at ankles bilaterally.  Proprioception is inconsistent at the great toe (right > left).  Temperature and pin prick intact. Mild sway with Romberg testing.  COORDINATION/GAIT: Normal finger-to- nose-finger and heel-to-shin.  Intact rapid alternating movements bilaterally.  Able to rise from a chair without using arms.  Gait shows slight wide-based gait, stable.  No shuffling apparent on exam today.  He is unsteady with tandem gait.  Stressed gait intact.    IMPRESSION: Mr. Sabino is an 81 year-old gentleman presenting for evaluation of gait imbalance.  His neurological examination shows a distal predominant large fiber peripheral neuropathy. I had extensive discussion with the patient regarding the pathogenesis, etiology, management, and natural course of neuropathy. Neuropathy tends to be slowly  progressive, especially if a treatable etiology is not identified. In his case, alcohol is the most likely contributing factor. I have asked him to cut back on alcohol as this may prevent progression of neuropathy, but in general, there will always be some degree of residual symptoms.   I would like to test for treatable causes of neuropathy.   I do not find evidence of parkinsonism on his exam as there is no tremor, bradykinesia, or rigidity.     PLAN/RECOMMENDATIONS:  1.  Check TSH, vitamin B12, copper 2.  Start physical therapy for gait training 3.  Encouraged to cut back on wine 4.  We discussed the implications of potential fall while on anticoagulation and the importance to keep him stable, even if that means using a gait assist device 5.  Fall precautions discussed 6.  Return to clinic as needed   The duration of this appointment visit was 60 minutes of face-to-face time with the patient.  Greater than 50% of this time was spent in counseling, explanation of diagnosis, planning of further management, and coordination of care.   Thank you for allowing me to participate in patient's care.  If I can answer any additional questions, I would be pleased to do so.    Sincerely,    Donika K. Posey Pronto, DO

## 2014-06-27 NOTE — Patient Instructions (Addendum)
It was great to see you today.  Your symptoms are most consistent with peripheral neuropathy which is most likely due to alcohol, so I would recommend that you cut back on your wine intake.  We will check for other causes of neuropathy with blood testing and will call you with the results.  In the meantime, we will start you in a physical therapy program.  Being on blood thinners, it is is very important to prevent falls and optimize your balance, so if you feel more secure with a walker, please use one.  [facility]  Preventing Falls in the Home   Falls are common, often dreaded events in the lives of older people. Aside from the obvious injuries and even death that may result, falls can cause wide-ranging consequences including loss of independence, mental decline, decreased activity, and mobility. Younger people are also at risk of falling, especially those with chronic illnesses and fatigue.  Ways to reduce the risk for falling:  * Examine diet and medications. Warm foods and alcohol dilate blood vessels, which can lead to dizziness when standing. Sleep aids, antidepressants, and pain medications can also increase the likelihood of a fall.  * Get a vison exam. Poor vision, cataracts, and glaucoma increase the chances of falling.  * Check foot gear. Shoes should fit snugly and have a sturdy, nonskid sole and broad, low heel.  * Participate in a physician-approved exercise program to build and maintain muscle strength and improve balance and coordination.  * Increase vitamin D intake. Vitamin D improves muscle strength and increases the amount of calcium the body is able to absorb and deposit in bones.  How to prevent falls from common hazards:  * Floors - Remove all loose wires, cords, and throw rugs. Minimize clutter. Make sure rugs are anchored and smooth. Keep furniture in its usual place.  * Chairs - Use chairs with straight backs, armrests, and firm seats. Add firm cushions to  existing pieces to add height.  * Bathroom - Install grab bars and non-skid tape in the tub or shower. Use a bathtub transfer bench or a shower chair with a back support. Use an elevated toilet seat and/or safety rails to assist standing from a low surface. Do not use towel racks or bathroom tissue holders to help you stand.  * Lighting - Make sure halls, stairways, and entrances are well-lit. Install a night light in your bathroom or hallway. Make sure there is a light switch at the top and bottom of the staircase. Turn lights on if you get up in the middle of the night. Make sure lamps or light switches are within reach of the bed if you have to get up during the night.  * Kitchen - Install non-skid rubber mats near the sink and stove. Clean spills immediately. Store frequently used utensils, pots, and pans between waist and eye level. This helps prevent reaching and bending. Sit when getting things out of the lower cupboards.  * Living room / [place] furniture with wide spaces in between, giving enough room to move around. Establish a route through the living room that gives you something to hold onto as you walk.  * Stairs - Make sure treads, rails, and rugs are secure. Install a rail on both sides of the stairs. If stairs are a threat, it might be helpful to arrange most of your activities on the lower level to reduce the number of times you must climb the stairs.  *  Entrances and doorways - Install metal handles on the walls adjacent to the doorknobs of all doors to make it more secure as you travel through the doorway.  Tips for maintaining balance:  * Keep at least one hand free at all times Try using a backpack or fanny pack to hold things rather than carrying them in your hands. Never carry objects in both hands when walking as this interferes with keeping your balance.  * Attempt to swing both arms from front to back while walking. This might require a conscious effort if Parkinson's  disease has diminished your movement. It will, however, help you to maintain balance and posture, and reduce fatigue.  * Consciously lift your feet off the ground when walking. Shuffling and dragging of the feet is a common culprit in losing your balance.  * When trying to navigate turns, use a "U" technique of facing forward and making a wide turn, rather than pivoting sharply.  * Try to stand with your feet shoulder-length apart. When your feet are close together for any length of time, you increase your risk of losing your balance and falling.  * Do one thing at a time. Do not try to walk and accomplish another task, such as reading or looking around. The decrease in your automatic reflexes complicates motor function, so the less distraction, the better.  * Do not wear rubber or gripping soled shoes, they might "catch" on the floor and cause tripping.  * Move slowly when changing positions. Use deliberate, concentrated movements and, if needed, use a grab bar or walking aid. Count fifteen (15) seconds after standing to begin walking.  * If balance is a continuous problem, you might want to consider a walking aid such as a cane, walking stick, or walker. Once you have mastered walking with help, you may be ready to try it again on your own.  This information is provided by [facility] and is not intended to replace the medical advice of your physician or other health care providers. Please consult your physician or other health care providers for advice regarding your specific medical condition.

## 2014-07-01 LAB — COPPER, SERUM: Copper: 126 ug/dL (ref 70–175)

## 2014-07-09 ENCOUNTER — Ambulatory Visit: Payer: Medicare Other | Attending: Neurology | Admitting: Physical Therapy

## 2014-07-09 DIAGNOSIS — R531 Weakness: Secondary | ICD-10-CM | POA: Insufficient documentation

## 2014-07-09 DIAGNOSIS — R269 Unspecified abnormalities of gait and mobility: Secondary | ICD-10-CM | POA: Diagnosis not present

## 2014-07-09 DIAGNOSIS — Z9181 History of falling: Secondary | ICD-10-CM

## 2014-07-09 NOTE — Therapy (Signed)
[facility] [place] [facility] [address]  [place], [place], [address] Phone: [phone]   Fax:  [phone]  Physical Therapy Evaluation  Patient Details  Name: Larry Lopez MRN: [id number] Date of Birth: [birth year] Referring Provider:  Alda Berthold, DO  Encounter Date: 07/09/2014      PT End of Session - 07/09/14 1128    Visit Number 1   Number of Visits 12   Date for PT Re-Evaluation 08/20/14   PT Start Time 0930   PT Stop Time 1012   PT Time Calculation (min) 42 min   Equipment Utilized During Treatment Gait belt   Activity Tolerance Patient tolerated treatment well   Behavior During Therapy [facility] for tasks assessed/performed      Past Medical History  Diagnosis Date  . CAD (coronary artery disease)   . HTN (hypertension)   . HLD (hyperlipidemia)   . Bradycardia   . Glaucoma   . Atrial fibrillation   . Tachy-brady syndrome     Past Surgical History  Procedure Laterality Date  . Tonsillectomy and adenoidectomy    . Coronary artery bypass graft    . Cardiac surgery    . Cardioversion N/A 02/06/2013    Procedure: CARDIOVERSION;  Surgeon: Jolaine Artist, MD;  Location: [facility] ENDOSCOPY;  Service: Cardiovascular;  Laterality: N/A;    There were no vitals filed for this visit.  Visit Diagnosis:  Abnormality of gait - Plan: PT plan of care cert/re-cert  Risk for falls - Plan: PT plan of care cert/re-cert  Weakness - Plan: PT plan of care cert/re-cert      Subjective Assessment - 07/09/14 0935    Subjective Pt is an 81 y/o male who presents to OPPT for balance deficits due to peripheral neuropathy x ~6 months.  Pt reports occasional shuffling.  Pt reports no falls as of yet.   Limitations Walking   Patient Stated Goals improve balance; prevent falls   Currently in Pain? No/denies            [facility] PT Assessment - 07/09/14 0998    Assessment   Medical Diagnosis peripheral neuropathy   Onset  Date/Surgical Date --  6 months   Next MD Visit PRN   Prior Therapy none   Precautions   Precautions Fall   Restrictions   Weight Bearing Restrictions No   Balance Screen   Has the patient fallen in the past 6 months No   Has the patient had a decrease in activity level because of a fear of falling?  No   Is the patient reluctant to leave their home because of a fear of falling?  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; Conservator, museum/gallery   Leisure reading, TV, play with grandchildren   Observation/Other Assessments   Focus on Therapeutic Outcomes (FOTO)  92 (8% limited; predicted 26% limited)   Strength   Strength Assessment Site Hip;Knee;Ankle   Right Hip Flexion 3+/5   Left Hip  Flexion 3+/5   Right/Left Knee Right;Left   Right Knee Flexion 5/5   Right Knee Extension 5/5   Left Knee Flexion 5/5   Left Knee Extension 5/5   Right Ankle Dorsiflexion 4/5   Left Ankle Dorsiflexion 5/5   Ambulation/Gait   Ambulation/Gait Yes   Ambulation/Gait Assistance 5: Supervision   Ambulation/Gait Assistance Details occasional shuffling   Ambulation Distance (Feet) 100 Feet   Assistive device None   Gait Pattern Shuffle;Poor foot clearance - left;Poor foot clearance - right   Ambulation Surface Level;Indoor   Standardized Balance Assessment   Standardized Balance Assessment Timed Up and Go Test;Berg Balance Test   Berg Balance Test   Sit to Stand Able to stand without using hands and stabilize independently   Standing Unsupported Able  to stand safely 2 minutes   Sitting with Back Unsupported but Feet Supported on Floor or Stool Able to sit safely and securely 2 minutes   Stand to Sit Sits safely with minimal use of hands   Transfers Able to transfer safely, minor use of hands   Standing Unsupported with Eyes Closed Able to stand 10 seconds with supervision   Standing Ubsupported with Feet Together Able to place feet together independently and stand 1 minute safely   From Standing, Reach Forward with Outstretched Arm Can reach confidently >25 cm (10")   From Standing Position, Pick up Object from Floor Able to pick up shoe safely and easily   From Standing Position, Turn to Look Behind Over each Shoulder Looks behind from both sides and weight shifts well   Turn 360 Degrees Needs close supervision or verbal cueing   Standing Unsupported, Alternately Place Feet on Step/Stool Able to complete 4 steps without aid or supervision   Standing Unsupported, One Foot in Front Able to plae foot ahead of the other independently and hold 30 seconds   Standing on One Leg Able to lift leg independently and hold 5-10 seconds   Total Score 48   Timed Up and Go Test   Normal TUG (seconds) 11.21   Manual TUG (seconds) 12   Cognitive TUG (seconds) 11.73   High Level Balance   High Level Balance Comments unable to stand on compliant surface with EC and feet apart                                PT Long Term Goals - 07/09/14 1137    PT LONG TERM GOAL #1   Title verbalize understanding of fall prevention strategies (08/06/14)   Time 6   Period Weeks   Status New   PT LONG TERM GOAL #2   Title improve BERG balance score to >/= 52/56 for improved balance and decreased fall risk (08/06/14)   Time 6   Period Weeks   Status New   PT LONG TERM GOAL #3   Title independent with HEP (08/06/14)   Time 6   Period Weeks   Status New   PT LONG TERM GOAL #4   Title ambulate > 500' on various indoor/outdoor surfaces with < 2  episodes of shuffling/decreased foot clearance independently for improved mobility (08/06/14)   Time 6   Period Weeks   Status New               Plan - 07/09/14 1128    Clinical Impression Statement Pt presents to OPPT with gait and balance deficits putting pt at moderate fall rsik.  Pt with limited vestibular input and decreased sensation affecting balance.  Will benefit from PT to maximize function and decrease fall risk.     Pt will benefit from skilled therapeutic intervention in order to improve on the following deficits Abnormal gait;Decreased balance;Decreased strength;Decreased safety awareness;Difficulty walking   Rehab Potential Good   PT Frequency 2x / week   PT Duration 6 weeks  anticipate d/c in 4 weeks   PT Treatment/Interventions ADLs/Self Care Home Management;Cryotherapy;Electrical Stimulation;Moist Heat;Therapeutic exercise;Therapeutic activities;Functional mobility training;Gait training;Stair training;Ultrasound;DME Instruction;Balance training;Neuromuscular re-education;Patient/family education   PT Next Visit Plan HEP for balance, compliant surface activities   Consulted and Agree with Plan of Care Patient          G-Codes - 08/08/14 1140    Functional Assessment Tool Used BERG 48/56   Functional Limitation Mobility: Walking and moving around   Mobility: Walking and Moving Around Current Status (B0488) At least 1 percent but less than 20 percent impaired, limited or restricted   Mobility: Walking and Moving Around Goal Status [phone]) At least 1 percent but less than 20 percent impaired, limited or restricted       Problem List Patient Active Problem List   Diagnosis Date Noted  . Coronary artery disease involving native coronary artery 11/05/2013  . Imbalance 11/05/2013  . Exertional dyspnea 08/06/2013  . Long term (current) use of anticoagulants 09/09/2010  . Routine health maintenance 08/22/2010  . HEARING LOSS 08/22/2009  . ATRIAL FIBRILLATION  06/27/2008  . BRADYCARDIA 03/21/2008  . VENOUS INSUFFICIENCY, LEGS 05/26/2007  . HYPERLIPIDEMIA 12/09/2006  . Essential hypertension 12/09/2006  . MYOCARDIAL INFARCTION, HX OF 12/09/2006  . CAD (coronary artery disease) 12/09/2006  . ATAXIA 12/09/2006   Laureen Abrahams, PT, DPT 2014-08-08 11:42 AM  [facility] [address]  [place], [place], [address] Phone: [phone]   Fax:  [phone]

## 2014-07-16 ENCOUNTER — Other Ambulatory Visit: Payer: Self-pay | Admitting: Internal Medicine

## 2014-07-16 ENCOUNTER — Ambulatory Visit: Payer: Medicare Other | Attending: Neurology | Admitting: Physical Therapy

## 2014-07-16 DIAGNOSIS — R531 Weakness: Secondary | ICD-10-CM | POA: Insufficient documentation

## 2014-07-16 DIAGNOSIS — R269 Unspecified abnormalities of gait and mobility: Secondary | ICD-10-CM | POA: Diagnosis not present

## 2014-07-16 DIAGNOSIS — Z9181 History of falling: Secondary | ICD-10-CM | POA: Insufficient documentation

## 2014-07-16 NOTE — Therapy (Signed)
[place], [place], [address] Phone: [phone]   Fax:  [phone]  Physical Therapy Treatment  Patient Details  Name: Larry Lopez MRN: [id number] Date of Birth: 1926/05/10 Referring Provider:  Alda Berthold, DO  Encounter Date: 07/16/2014      PT End of Session - 07/16/14 0925    Visit Number 2   Number of Visits 12   Date for PT Re-Evaluation 08/20/14   PT Start Time 0845   PT Stop Time 0925   PT Time Calculation (min) 40 min   Activity Tolerance Patient tolerated treatment well   Behavior During Therapy [facility] for tasks assessed/performed      Past Medical History  Diagnosis Date  . CAD (coronary artery disease)   . HTN (hypertension)   . HLD (hyperlipidemia)   . Bradycardia   . Glaucoma   . Atrial fibrillation   . Tachy-brady syndrome     Past Surgical History  Procedure Laterality Date  . Tonsillectomy and adenoidectomy    . Coronary artery bypass graft    . Cardiac surgery    . Cardioversion N/A 02/06/2013    Procedure: CARDIOVERSION;  Surgeon: Jolaine Artist, MD;  Location: [facility] ENDOSCOPY;  Service: Cardiovascular;  Laterality: N/A;    There were no vitals filed for this visit.  Visit Diagnosis:  Abnormality of gait  Risk for falls  Weakness      Subjective Assessment - 07/16/14 0851    Subjective Feeling good; no falls   Patient Stated Goals improve balance; prevent falls   Currently in Pain? No/denies                         [facility] Adult PT Treatment/Exercise - 07/16/14 0851    Knee/Hip Exercises: Aerobic   Nustep Level 5 x 8 min   Knee/Hip Exercises: Machines for Strengthening   Cybex Knee Extension 10# 3x10   Cybex Knee Flexion 20# 3x10             Balance Exercises - 07/16/14 0901    Balance Exercises: Standing   Standing Eyes Opened Narrow base of support (BOS);Head turns;Foam/compliant surface  x10 each direction   Standing Eyes Closed Wide (BOA);Foam/compliant surface;5 reps;10 secs;Head turns  x10 head turns each direction           PT Education - 07/16/14 0919    Education provided Yes   Education Details HEP   Person(s) Educated Patient   Methods Explanation;Demonstration;Handout   Comprehension Verbalized understanding             PT Long Term Goals - 07/16/14 0926    PT LONG TERM GOAL #1   Title verbalize understanding of fall prevention strategies (08/06/14)   Status On-going   PT LONG TERM GOAL #2   Title improve BERG balance score to >/= 52/56 for improved balance and decreased fall risk (08/06/14)   Status On-going   PT LONG TERM GOAL #3   Title independent with HEP (08/06/14)   Status On-going   PT LONG TERM GOAL #4   Title ambulate > 500' on various indoor/outdoor surfaces with < 2 episodes of shuffling/decreased foot clearance independently for improved mobility (08/06/14)   Status On-going               Plan - 07/16/14 0926    Clinical Impression Statement Issued HEP for balance.  Demonstrates decreased vestibular input with compliant  surface exercises.   PT Next Visit Plan HEP for balance, compliant surface activities        Problem List Patient Active Problem List   Diagnosis Date Noted  . Coronary artery disease involving native coronary artery 11/05/2013  . Imbalance 11/05/2013  . Exertional dyspnea 08/06/2013  . Long term (current) use of anticoagulants 09/09/2010  . Routine health maintenance 08/22/2010  . HEARING LOSS 08/22/2009  . ATRIAL FIBRILLATION 06/27/2008  . BRADYCARDIA 03/21/2008  . VENOUS INSUFFICIENCY, LEGS 05/26/2007  . HYPERLIPIDEMIA 12/09/2006  . Essential hypertension 12/09/2006  . MYOCARDIAL INFARCTION, HX OF 12/09/2006  . CAD (coronary artery disease) 12/09/2006  . ATAXIA 12/09/2006   Laureen Abrahams, PT, DPT 07/16/2014 9:27 AM  [place] [address]  [place], [place], [address] Phone: [phone]   Fax:  [phone]

## 2014-07-16 NOTE — Patient Instructions (Signed)
Feet Apart (Compliant Surface) Varied Arm Positions - Eyes Closed   Stand on compliant surface: _pillow__ with feet shoulder width apart and arms at sides. Close eyes and stand still. Hold_10___ seconds. Repeat _5___ times per session. Do __2-3__ sessions per day.  Copyright  VHI. All rights reserved.   Feet Apart (Compliant Surface) Head Motion - Eyes Closed   Stand on compliant surface: _pillow__ with feet shoulder width apart. Close eyes and move head slowly, up and down 10 times.  Repeat turning head side to side 10 times. Repeat __1__ times per session. Do _2-3___ sessions per day.  Copyright  VHI. All rights reserved.    Feet Together (Compliant Surface) Head Motion - Eyes Open   With eyes open, standing on compliant surface: _pillow_, feet together, move head slowly: up and down 10 times.  Repeat moving head side to side 10 times. Repeat _1___ times per session. Do __2-3__ sessions per day.  Copyright  VHI. All rights reserved.   Stand in a corner with a chair in front of you for safety.

## 2014-07-19 ENCOUNTER — Encounter: Payer: Self-pay | Admitting: Physical Therapy

## 2014-07-19 ENCOUNTER — Ambulatory Visit: Payer: Medicare Other | Admitting: Physical Therapy

## 2014-07-19 DIAGNOSIS — Z9181 History of falling: Secondary | ICD-10-CM | POA: Diagnosis not present

## 2014-07-19 DIAGNOSIS — R269 Unspecified abnormalities of gait and mobility: Secondary | ICD-10-CM | POA: Diagnosis not present

## 2014-07-19 DIAGNOSIS — R531 Weakness: Secondary | ICD-10-CM | POA: Diagnosis not present

## 2014-07-19 NOTE — Therapy (Signed)
[place], [place], [address] Phone: [phone]   Fax:  [phone]  Physical Therapy Treatment  Patient Details  Name: Larry Lopez MRN: [id number] Date of Birth: July 14, 1926 Referring Provider:  Alda Berthold, DO  Encounter Date: 07/19/2014      PT End of Session - 07/19/14 1132    Visit Number 3   Number of Visits 12   Date for PT Re-Evaluation 08/20/14   PT Start Time 1050   PT Stop Time 1130   PT Time Calculation (min) 40 min      Past Medical History  Diagnosis Date  . CAD (coronary artery disease)   . HTN (hypertension)   . HLD (hyperlipidemia)   . Bradycardia   . Glaucoma   . Atrial fibrillation   . Tachy-brady syndrome     Past Surgical History  Procedure Laterality Date  . Tonsillectomy and adenoidectomy    . Coronary artery bypass graft    . Cardiac surgery    . Cardioversion N/A 02/06/2013    Procedure: CARDIOVERSION;  Surgeon: Jolaine Artist, MD;  Location: [facility] ENDOSCOPY;  Service: Cardiovascular;  Laterality: N/A;    There were no vitals filed for this visit.  Visit Diagnosis:  Abnormality of gait  Risk for falls      Subjective Assessment - 07/19/14 1051    Subjective verb doing HEP for balance without any issues/questions   Currently in Pain? No/denies                         [facility] Adult PT Treatment/Exercise - 07/19/14 0001    High Level Balance   High Level Balance Activities Braiding;Head turns;Tandem walking;Figure 8 turns;Negotiating over obstacles  dynamic gait with cones, ball toss on airex   High Level Balance Comments --  LOB with high ball toss and quick changes in direction   Exercises   Exercises Knee/Hip   Knee/Hip Exercises: Aerobic   Nustep Level 5 x 8 min   Knee/Hip Exercises: Machines for Strengthening   Cybex Knee Extension 10# 3x10   Cybex Knee Flexion 20# 3x10   Knee/Hip Exercises: Standing   Functional  Squat 2 sets;5 reps  yellow weighted ball   Walking with Sports Cord 40# 3 times each way  min A required with LOB seversal times   Other Standing Knee Exercises red tabnd hip flex,ext and abd 15                      PT Long Term Goals - 07/16/14 6720    PT LONG TERM GOAL #1   Title verbalize understanding of fall prevention strategies (08/06/14)   Status On-going   PT LONG TERM GOAL #2   Title improve BERG balance score to >/= 52/56 for improved balance and decreased fall risk (08/06/14)   Status On-going   PT LONG TERM GOAL #3   Title independent with HEP (08/06/14)   Status On-going   PT LONG TERM GOAL #4   Title ambulate > 500' on various indoor/outdoor surfaces with < 2 episodes of shuffling/decreased foot clearance independently for improved mobility (08/06/14)   Status On-going               Plan - 07/19/14 1132    Clinical Impression Statement pt very compliant with PT session, pt very active with ther ex at [facility] and swimming, minimal LOB with advanced /high  level balance and resistive gait.   PT Next Visit Plan HIGH LEVEL BALANCE ACTIVITIES        Problem List Patient Active Problem List   Diagnosis Date Noted  . Coronary artery disease involving native coronary artery 11/05/2013  . Imbalance 11/05/2013  . Exertional dyspnea 08/06/2013  . Long term (current) use of anticoagulants 09/09/2010  . Routine health maintenance 08/22/2010  . HEARING LOSS 08/22/2009  . ATRIAL FIBRILLATION 06/27/2008  . BRADYCARDIA 03/21/2008  . VENOUS INSUFFICIENCY, LEGS 05/26/2007  . HYPERLIPIDEMIA 12/09/2006  . Essential hypertension 12/09/2006  . MYOCARDIAL INFARCTION, HX OF 12/09/2006  . CAD (coronary artery disease) 12/09/2006  . ATAXIA 12/09/2006    PAYSEUR,ANGIE PTA 07/19/2014, 11:34 AM  [place] [address] [place], [place], [address] Phone: [phone]   Fax:  [phone]

## 2014-07-22 ENCOUNTER — Ambulatory Visit: Payer: Medicare Other | Admitting: Physical Therapy

## 2014-07-22 ENCOUNTER — Encounter: Payer: Self-pay | Admitting: Physical Therapy

## 2014-07-22 DIAGNOSIS — Z9181 History of falling: Secondary | ICD-10-CM | POA: Diagnosis not present

## 2014-07-22 DIAGNOSIS — R269 Unspecified abnormalities of gait and mobility: Secondary | ICD-10-CM | POA: Diagnosis not present

## 2014-07-22 DIAGNOSIS — R531 Weakness: Secondary | ICD-10-CM | POA: Diagnosis not present

## 2014-07-22 NOTE — Therapy (Signed)
[place], [place], [address] Phone: [phone]   Fax:  [phone]  Physical Therapy Treatment  Patient Details  Name: Larry Lopez MRN: [id number] Date of Birth: May 27, 1926 Referring Provider:  Alda Berthold, DO  Encounter Date: 07/22/2014      PT End of Session - 07/22/14 1056    Visit Number 4   Number of Visits 12   Date for PT Re-Evaluation 08/20/14   PT Start Time 1009   PT Stop Time 1057   PT Time Calculation (min) 48 min   Activity Tolerance Patient tolerated treatment well   Behavior During Therapy [facility] for tasks assessed/performed      Past Medical History  Diagnosis Date  . CAD (coronary artery disease)   . HTN (hypertension)   . HLD (hyperlipidemia)   . Bradycardia   . Glaucoma   . Atrial fibrillation   . Tachy-brady syndrome     Past Surgical History  Procedure Laterality Date  . Tonsillectomy and adenoidectomy    . Coronary artery bypass graft    . Cardiac surgery    . Cardioversion N/A 02/06/2013    Procedure: CARDIOVERSION;  Surgeon: Jolaine Artist, MD;  Location: [facility] ENDOSCOPY;  Service: Cardiovascular;  Laterality: N/A;    There were no vitals filed for this visit.  Visit Diagnosis:  Abnormality of gait  Risk for falls      Subjective Assessment - 07/22/14 1010    Subjective Pt reports that he is doing great with no falls. Pt reports compliance with HEP.   Limitations Walking   Patient Stated Goals improve balance; prevent falls   Currently in Pain? No/denies   Multiple Pain Sites No                         [facility] Adult PT Treatment/Exercise - 07/22/14 0001    High Level Balance   High Level Balance Activities Tandem walking;Backward walking;Side stepping   Knee/Hip Exercises: Aerobic   Nustep Level 5 x 8 min   Knee/Hip Exercises: Machines for Strengthening   Cybex Knee Extension 15# 3x10   Cybex Knee Flexion 25# 3x10   Knee/Hip Exercises: Standing   Functional Squat 2 sets;10 reps  yellow weighted ball, Blue weighted ball    Other Standing Knee Exercises sit to stand with bilat overhead press with blue weighted ball 10 reps 2 sets.   Other Standing Knee Exercises standing march on airex 10 reps 2 sets, Standing hip abduction on airex 10 reps 2 sets, standing hip extensions on airex 10 reps 2 sets.                     PT Long Term Goals - 07/22/14 1059    PT LONG TERM GOAL #1   Title verbalize understanding of fall prevention strategies (08/06/14)   Status On-going   PT LONG TERM GOAL #2   Title improve BERG balance score to >/= 52/56 for improved balance and decreased fall risk (08/06/14)   Status On-going   PT LONG TERM GOAL #3   Title independent with HEP (08/06/14)   Status Partially Met   PT LONG TERM GOAL #4   Title ambulate > 500' on various indoor/outdoor surfaces with < 2 episodes of shuffling/decreased foot clearance independently for improved mobility (08/06/14)   Status On-going               Plan -  07/22/14 1057    Clinical Impression Statement Pt pleasant and motivated to participate in PT. Pt reports being very active at home and at [facility]. Pt does have some difficulty with exercises on airex but is determine.    Pt will benefit from skilled therapeutic intervention in order to improve on the following deficits Abnormal gait;Decreased balance;Decreased strength;Decreased safety awareness;Difficulty walking   PT Frequency 2x / week   PT Duration 6 weeks   PT Treatment/Interventions ADLs/Self Care Home Management;Therapeutic exercise;Therapeutic activities;Functional mobility training;Gait training;Stair training;Ultrasound;DME Instruction;Balance training;Neuromuscular re-education;Patient/family education        Problem List Patient Active Problem List   Diagnosis Date Noted  . Coronary artery disease involving native coronary artery 11/05/2013  . Imbalance  11/05/2013  . Exertional dyspnea 08/06/2013  . Long term (current) use of anticoagulants 09/09/2010  . Routine health maintenance 08/22/2010  . HEARING LOSS 08/22/2009  . ATRIAL FIBRILLATION 06/27/2008  . BRADYCARDIA 03/21/2008  . VENOUS INSUFFICIENCY, LEGS 05/26/2007  . HYPERLIPIDEMIA 12/09/2006  . Essential hypertension 12/09/2006  . MYOCARDIAL INFARCTION, HX OF 12/09/2006  . CAD (coronary artery disease) 12/09/2006  . ATAXIA 12/09/2006    Scot Jun, PTA 07/22/2014, 11:00 AM  [place], [place], [address] Phone: [phone]   Fax:  [phone]

## 2014-07-25 ENCOUNTER — Encounter: Payer: Self-pay | Admitting: Physical Therapy

## 2014-07-25 ENCOUNTER — Ambulatory Visit: Payer: Medicare Other | Admitting: Physical Therapy

## 2014-07-25 DIAGNOSIS — R531 Weakness: Secondary | ICD-10-CM

## 2014-07-25 DIAGNOSIS — Z9181 History of falling: Secondary | ICD-10-CM | POA: Diagnosis not present

## 2014-07-25 DIAGNOSIS — R269 Unspecified abnormalities of gait and mobility: Secondary | ICD-10-CM | POA: Diagnosis not present

## 2014-07-25 NOTE — Therapy (Signed)
[place], [place], [address] Phone: [phone]   Fax:  [phone]  Physical Therapy Treatment  Patient Details  Name: Larry Lopez MRN: [id number] Date of Birth: [birth year] Referring Provider:  Alda Berthold, DO  Encounter Date: 07/25/2014      PT End of Session - 07/25/14 0825    Visit Number 5   Number of Visits 12   Date for PT Re-Evaluation 08/20/14   PT Start Time 0800   PT Stop Time 0840   PT Time Calculation (min) 40 min      Past Medical History  Diagnosis Date  . CAD (coronary artery disease)   . HTN (hypertension)   . HLD (hyperlipidemia)   . Bradycardia   . Glaucoma   . Atrial fibrillation   . Tachy-brady syndrome     Past Surgical History  Procedure Laterality Date  . Tonsillectomy and adenoidectomy    . Coronary artery bypass graft    . Cardiac surgery    . Cardioversion N/A 02/06/2013    Procedure: CARDIOVERSION;  Surgeon: Jolaine Artist, MD;  Location: [facility] ENDOSCOPY;  Service: Cardiovascular;  Laterality: N/A;    There were no vitals filed for this visit.  Visit Diagnosis:  Abnormality of gait  Risk for falls  Weakness      Subjective Assessment - 07/25/14 0800    Subjective Doing great,at Y and doing HEPs   Currently in Pain? No/denies            [facility] PT Assessment - 07/25/14 0001    Berg Balance Test   Sit to Stand Able to stand without using hands and stabilize independently   Standing Unsupported Able to stand safely 2 minutes   Sitting with Back Unsupported but Feet Supported on Floor or Stool Able to sit safely and securely 2 minutes   Stand to Sit Sits safely with minimal use of hands   Transfers Able to transfer safely, minor use of hands   Standing Unsupported with Eyes Closed Able to stand 10 seconds safely   Standing Ubsupported with Feet Together Able to place feet together independently and stand 1 minute safely   From  Standing, Reach Forward with Outstretched Arm Can reach confidently >25 cm (10")   From Standing Position, Pick up Object from Floor Able to pick up shoe safely and easily   From Standing Position, Turn to Look Behind Over each Shoulder Looks behind from both sides and weight shifts well   Turn 360 Degrees Able to turn 360 degrees safely in 4 seconds or less   Standing Unsupported, Alternately Place Feet on Step/Stool Able to stand independently and complete 8 steps >20 seconds   Standing Unsupported, One Foot in Front Able to place foot tandem independently and hold 30 seconds   Standing on One Leg Able to lift leg independently and hold > 10 seconds   Total Score 55                     [facility] Adult PT Treatment/Exercise - 07/25/14 0001    Knee/Hip Exercises: Aerobic   Nustep Level 6 x 8 min   Knee/Hip Exercises: Machines for Strengthening   Cybex Knee Extension 20# 3x10   Cybex Knee Flexion 35# 3 sets 10   Cybex Leg Press 40# 3 sets 10                PT Education - 07/25/14  0810    Education provided Yes   Education Details HEP for Balance- Come Handout   Person(s) Educated Patient   Methods Explanation;Demonstration   Comprehension Verbalized understanding;Returned demonstration             PT Long Term Goals - 07/25/14 0823    PT LONG TERM GOAL #2   Title improve BERG balance score to >/= 52/56 for improved balance and decreased fall risk (08/06/14)   Baseline 55/56   Status Achieved               Plan - 07/25/14 0825    Clinical Impression Statement pt making excellent progress with all goals. BERG increase to 55/56. Added standing balance program   PT Next Visit Plan HIGH LEVEL BALANCE ACTIVITIES        Problem List Patient Active Problem List   Diagnosis Date Noted  . Coronary artery disease involving native coronary artery 11/05/2013  . Imbalance 11/05/2013  . Exertional dyspnea 08/06/2013  . Long term (current) use of  anticoagulants 09/09/2010  . Routine health maintenance 08/22/2010  . HEARING LOSS 08/22/2009  . ATRIAL FIBRILLATION 06/27/2008  . BRADYCARDIA 03/21/2008  . VENOUS INSUFFICIENCY, LEGS 05/26/2007  . HYPERLIPIDEMIA 12/09/2006  . Essential hypertension 12/09/2006  . MYOCARDIAL INFARCTION, HX OF 12/09/2006  . CAD (coronary artery disease) 12/09/2006  . ATAXIA 12/09/2006    Jaymond Waage,ANGIE PTA  07/25/2014, 8:27 AM  [place] [address] [place], [place], [address] Phone: [phone]   Fax:  [phone]

## 2014-07-29 ENCOUNTER — Encounter: Payer: Self-pay | Admitting: Physical Therapy

## 2014-07-29 ENCOUNTER — Ambulatory Visit: Payer: Medicare Other | Admitting: Physical Therapy

## 2014-07-29 ENCOUNTER — Ambulatory Visit: Payer: Medicare Other | Admitting: Neurology

## 2014-07-29 DIAGNOSIS — R531 Weakness: Secondary | ICD-10-CM

## 2014-07-29 DIAGNOSIS — R269 Unspecified abnormalities of gait and mobility: Secondary | ICD-10-CM | POA: Diagnosis not present

## 2014-07-29 DIAGNOSIS — Z9181 History of falling: Secondary | ICD-10-CM | POA: Diagnosis not present

## 2014-07-29 NOTE — Therapy (Signed)
[place], [place], [address] Phone: [phone]   Fax:  [phone]  Physical Therapy Treatment  Patient Details  Name: Larry Lopez MRN: [id number] Date of Birth: Jun 04, 1926 Referring Provider:  Alda Berthold, DO  Encounter Date: 07/29/2014      PT End of Session - 07/29/14 1507    Visit Number 6   Number of Visits 12   Date for PT Re-Evaluation 08/20/14   PT Start Time 1245   PT Stop Time 1508   PT Time Calculation (min) 43 min      Past Medical History  Diagnosis Date  . CAD (coronary artery disease)   . HTN (hypertension)   . HLD (hyperlipidemia)   . Bradycardia   . Glaucoma   . Atrial fibrillation   . Tachy-brady syndrome     Past Surgical History  Procedure Laterality Date  . Tonsillectomy and adenoidectomy    . Coronary artery bypass graft    . Cardiac surgery    . Cardioversion N/A 02/06/2013    Procedure: CARDIOVERSION;  Surgeon: Jolaine Artist, MD;  Location: [facility] ENDOSCOPY;  Service: Cardiovascular;  Laterality: N/A;    There were no vitals filed for this visit.  Visit Diagnosis:  Risk for falls  Weakness      Subjective Assessment - 07/29/14 1427    Subjective Pt reports that he is doing well.    Limitations Walking   Patient Stated Goals improve balance; prevent falls                         [facility] Adult PT Treatment/Exercise - 07/29/14 0001    High Level Balance   High Level Balance Activities Side stepping;Tandem walking;Negotiating over obstacles;Negotitating around obstacles  all performed with #2 ankle weight around a single ankle and   Knee/Hip Exercises: Aerobic   NuStep Level 6 x 8 min   Knee/Hip Exercises: Machines for Strengthening   Cybex Knee Extension 25# 3x10   Cybex Knee Flexion 45# 3 sets 10   Cybex Leg Press 40# 3 sets 10   Knee/Hip Exercises: Standing   Functional Squat 10 reps;2 sets  with blue weighted ball     SLS with rebound toss 10 reps 3 sets    Other Standing Knee Exercises standing on airex with eyes closes 15 sec 3 sets                      PT Long Term Goals - 07/25/14 0823    PT LONG TERM GOAL #2   Title improve BERG balance score to >/= 52/56 for improved balance and decreased fall risk (08/06/14)   Baseline 55/56   Status Achieved               Plan - 07/29/14 1508    Clinical Impression Statement Pt continues to progress with PT session.Pt does demo difficulty with tandem walking as well as single let stance with rebound ball toss   Pt will benefit from skilled therapeutic intervention in order to improve on the following deficits Abnormal gait;Decreased balance;Decreased strength;Decreased safety awareness;Difficulty walking   Rehab Potential Good   PT Frequency 2x / week   PT Duration 6 weeks   PT Treatment/Interventions ADLs/Self Care Home Management;Therapeutic exercise;Therapeutic activities;Functional mobility training;Gait training;Stair training;Ultrasound;DME Instruction;Balance training;Neuromuscular re-education;Patient/family education   PT Next Visit Plan HIGH LEVEL BALANCE ACTIVITIES  Problem List Patient Active Problem List   Diagnosis Date Noted  . Coronary artery disease involving native coronary artery 11/05/2013  . Imbalance 11/05/2013  . Exertional dyspnea 08/06/2013  . Long term (current) use of anticoagulants 09/09/2010  . Routine health maintenance 08/22/2010  . HEARING LOSS 08/22/2009  . ATRIAL FIBRILLATION 06/27/2008  . BRADYCARDIA 03/21/2008  . VENOUS INSUFFICIENCY, LEGS 05/26/2007  . HYPERLIPIDEMIA 12/09/2006  . Essential hypertension 12/09/2006  . MYOCARDIAL INFARCTION, HX OF 12/09/2006  . CAD (coronary artery disease) 12/09/2006  . ATAXIA 12/09/2006    Scot Jun, PTA  07/29/2014, 3:09 PM  [place], [place],  [address] Phone: [phone]   Fax:  [phone]

## 2014-08-01 ENCOUNTER — Ambulatory Visit: Payer: Medicare Other | Admitting: Physical Therapy

## 2014-08-01 ENCOUNTER — Encounter: Payer: Self-pay | Admitting: Physical Therapy

## 2014-08-01 DIAGNOSIS — R269 Unspecified abnormalities of gait and mobility: Secondary | ICD-10-CM | POA: Diagnosis not present

## 2014-08-01 DIAGNOSIS — R531 Weakness: Secondary | ICD-10-CM | POA: Diagnosis not present

## 2014-08-01 DIAGNOSIS — Z9181 History of falling: Secondary | ICD-10-CM | POA: Diagnosis not present

## 2014-08-01 NOTE — Therapy (Signed)
[place], [place], [address] Phone: [phone]   Fax:  [phone]  Physical Therapy Treatment  Patient Details  Name: Larry Lopez MRN: [id number] Date of Birth: March 11, 1926 Referring Provider:  Alda Berthold, DO  Encounter Date: 08/01/2014      PT End of Session - 08/01/14 0847    Visit Number 7   Number of Visits 12   Date for PT Re-Evaluation 08/20/14   PT Start Time 0758   PT Stop Time 0845   PT Time Calculation (min) 47 min   Activity Tolerance Patient tolerated treatment well   Behavior During Therapy [facility] for tasks assessed/performed      Past Medical History  Diagnosis Date  . CAD (coronary artery disease)   . HTN (hypertension)   . HLD (hyperlipidemia)   . Bradycardia   . Glaucoma   . Atrial fibrillation   . Tachy-brady syndrome     Past Surgical History  Procedure Laterality Date  . Tonsillectomy and adenoidectomy    . Coronary artery bypass graft    . Cardiac surgery    . Cardioversion N/A 02/06/2013    Procedure: CARDIOVERSION;  Surgeon: Jolaine Artist, MD;  Location: [facility] ENDOSCOPY;  Service: Cardiovascular;  Laterality: N/A;    There were no vitals filed for this visit.  Visit Diagnosis:  Risk for falls  Weakness  Abnormality of gait      Subjective Assessment - 08/01/14 0800    Subjective "I graduate today"   Patient Stated Goals improve balance; prevent falls   Currently in Pain? No/denies   Multiple Pain Sites No                         [facility] Adult PT Treatment/Exercise - 08/01/14 0001    Ambulation/Gait   Stairs Yes   Stairs Assistance 5: Supervision;4: Min guard;6: Modified independent (Device/Increase time)   Stair Management Technique No rails;One rail Right;One rail Left;Alternating pattern;Step to pattern;Forwards   Number of Stairs 15   Height of Stairs 6   Knee/Hip Exercises: Aerobic   Nustep Level 6 x 8 min   Knee/Hip Exercises: Machines for Strengthening   Cybex Knee Extension 35# 3x10   Cybex Knee Flexion 45# 3 sets 10   Cybex Leg Press 60# 3 sets 10   Knee/Hip Exercises: Standing   Forward Step Up 10 reps;Step Height: 6";3 sets;Both  #5 ankle weight    Other Standing Knee Exercises Standing march #5 10 reps 3 sets    Other Standing Knee Exercises side step and squat with yellow TB 10 reps 2 sets                      PT Long Term Goals - 08/01/14 0809    PT LONG TERM GOAL #3   Title independent with HEP (08/06/14)   PT LONG TERM GOAL #4   Title ambulate > 500' on various indoor/outdoor surfaces with < 2 episodes of shuffling/decreased foot clearance independently for improved mobility (08/06/14)   Status Partially Met               Plan - 08/01/14 0848    Clinical Impression Statement Pt tolerated treatment well but has some difficulty with stair negotiation without rail. Pt demo decrease foot clarence when negotiating stairs. Pt does tolerate machine interventions with increased weight.  Pt is motivated and wiling participant during PT  Pt will benefit from skilled therapeutic intervention in order to improve on the following deficits Abnormal gait;Decreased balance;Decreased strength;Decreased safety awareness;Difficulty walking   Rehab Potential Good   PT Frequency 2x / week   PT Duration 6 weeks   PT Treatment/Interventions ADLs/Self Care Home Management;Therapeutic exercise;Therapeutic activities;Functional mobility training;Gait training;Stair training;Ultrasound;DME Instruction;Balance training;Neuromuscular re-education;Patient/family education   PT Next Visit Plan Discussed with Pt the possible benefits of one to two more treatments. Pt reports that he is undecided if he will continue PT or not. Will hold PT for one week, if not informed from of Pt decision will discontinue PT  in one week.  Pt verbalize understanding         Problem List Patient Active  Problem List   Diagnosis Date Noted  . Coronary artery disease involving native coronary artery 11/05/2013  . Imbalance 11/05/2013  . Exertional dyspnea 08/06/2013  . Long term (current) use of anticoagulants 09/09/2010  . Routine health maintenance 08/22/2010  . HEARING LOSS 08/22/2009  . ATRIAL FIBRILLATION 06/27/2008  . BRADYCARDIA 03/21/2008  . VENOUS INSUFFICIENCY, LEGS 05/26/2007  . HYPERLIPIDEMIA 12/09/2006  . Essential hypertension 12/09/2006  . MYOCARDIAL INFARCTION, HX OF 12/09/2006  . CAD (coronary artery disease) 12/09/2006  . ATAXIA 12/09/2006    Scot Jun, PTA 08/01/2014, 8:55 AM  [place], [place], [address] Phone: [phone]   Fax:  [phone]

## 2014-08-05 DIAGNOSIS — H4010X2 Unspecified open-angle glaucoma, moderate stage: Secondary | ICD-10-CM | POA: Diagnosis not present

## 2014-08-05 DIAGNOSIS — H01001 Unspecified blepharitis right upper eyelid: Secondary | ICD-10-CM | POA: Diagnosis not present

## 2014-08-05 DIAGNOSIS — Z961 Presence of intraocular lens: Secondary | ICD-10-CM | POA: Diagnosis not present

## 2014-08-05 DIAGNOSIS — H40243 Residual stage of angle-closure glaucoma, bilateral: Secondary | ICD-10-CM | POA: Diagnosis not present

## 2014-08-07 ENCOUNTER — Encounter: Payer: Self-pay | Admitting: Physical Therapy

## 2014-08-07 ENCOUNTER — Ambulatory Visit: Payer: Medicare Other | Admitting: Physical Therapy

## 2014-08-07 DIAGNOSIS — R269 Unspecified abnormalities of gait and mobility: Secondary | ICD-10-CM

## 2014-08-07 DIAGNOSIS — R531 Weakness: Secondary | ICD-10-CM | POA: Diagnosis not present

## 2014-08-07 DIAGNOSIS — Z9181 History of falling: Secondary | ICD-10-CM | POA: Diagnosis not present

## 2014-08-07 NOTE — Therapy (Signed)
[place], [place], [address] Phone: [phone]   Fax:  [phone]  Physical Therapy Treatment  Patient Details  Name: Larry Lopez MRN: [id number] Date of Birth: 19-Feb-1926 Referring Provider:  Alda Berthold, DO  Encounter Date: 08/07/2014      PT End of Session - 08/07/14 0840    Visit Number 8   Number of Visits 12   Date for PT Re-Evaluation 08/20/14   PT Start Time 0758   PT Stop Time 0841   PT Time Calculation (min) 43 min      Past Medical History  Diagnosis Date  . CAD (coronary artery disease)   . HTN (hypertension)   . HLD (hyperlipidemia)   . Bradycardia   . Glaucoma   . Atrial fibrillation   . Tachy-brady syndrome     Past Surgical History  Procedure Laterality Date  . Tonsillectomy and adenoidectomy    . Coronary artery bypass graft    . Cardiac surgery    . Cardioversion N/A 02/06/2013    Procedure: CARDIOVERSION;  Surgeon: Jolaine Artist, MD;  Location: [facility] ENDOSCOPY;  Service: Cardiovascular;  Laterality: N/A;    There were no vitals filed for this visit.  Visit Diagnosis:  Risk for falls  Weakness  Abnormality of gait      Subjective Assessment - 08/07/14 0801    Subjective Pt stated "It would be stupid to stop coming"   Limitations Walking   Patient Stated Goals improve balance; prevent falls   Currently in Pain? No/denies   Multiple Pain Sites No                         [facility] Adult PT Treatment/Exercise - 08/07/14 0001    High Level Balance   High Level Balance Activities Backward walking;Tandem walking   Knee/Hip Exercises: Aerobic   Nustep Level 6 x 8 min   Knee/Hip Exercises: Machines for Strengthening   Cybex Knee Extension 35# 3x10  Cues for TKE    Cybex Knee Flexion 45# 3 sets 10   Cybex Leg Press 70# 3 sets 10   Knee/Hip Exercises: Standing   Forward Step Up 10 reps;Step Height: 6";3 sets;Both  #5 ankle  weights; LOB x1   Functional Squat 10 reps   Other Standing Knee Exercises Standing march #5 10 reps 3 sets    Other Standing Knee Exercises side step and over #5 ankle 4 in step 5 reps 2 sets    Knee/Hip Exercises: Seated   Sit to Sand 10 reps;2 sets  on airex holding blue weighted ball                     PT Long Term Goals - 08/07/14 0843    PT LONG TERM GOAL #4   Title ambulate > 500' on various indoor/outdoor surfaces with < 2 episodes of shuffling/decreased foot clearance independently for improved mobility (08/06/14)   Status Partially Met               Plan - 08/07/14 0841    Clinical Impression Statement Pt demo ed a decreased activity tolerance with treatment this date. Pt reports that he felt tired but didn't know why. Pt continues to display  decreased foot clarence with step ups. Pt also continues to have difficulty with tandem walking.    Pt will benefit from skilled therapeutic intervention in order to improve on  the following deficits Abnormal gait;Decreased balance;Decreased strength;Decreased safety awareness;Difficulty walking   PT Frequency 2x / week   PT Duration 6 weeks   PT Treatment/Interventions ADLs/Self Care Home Management;Therapeutic exercise;Therapeutic activities;Functional mobility training;Gait training;Stair training;Ultrasound;DME Instruction;Balance training;Neuromuscular re-education;Patient/family education   PT Next Visit Plan continue with balance interventions        Problem List Patient Active Problem List   Diagnosis Date Noted  . Coronary artery disease involving native coronary artery 11/05/2013  . Imbalance 11/05/2013  . Exertional dyspnea 08/06/2013  . Long term (current) use of anticoagulants 09/09/2010  . Routine health maintenance 08/22/2010  . HEARING LOSS 08/22/2009  . ATRIAL FIBRILLATION 06/27/2008  . BRADYCARDIA 03/21/2008  . VENOUS INSUFFICIENCY, LEGS 05/26/2007  . HYPERLIPIDEMIA 12/09/2006  .  Essential hypertension 12/09/2006  . MYOCARDIAL INFARCTION, HX OF 12/09/2006  . CAD (coronary artery disease) 12/09/2006  . ATAXIA 12/09/2006    Scot Jun, PTA 08/07/2014, 8:44 AM  [place], [place], [address] Phone: [phone]   Fax:  [phone]

## 2014-08-12 ENCOUNTER — Ambulatory Visit: Payer: Medicare Other | Attending: Neurology | Admitting: Physical Therapy

## 2014-08-12 ENCOUNTER — Encounter: Payer: Medicare Other | Admitting: Physical Therapy

## 2014-08-12 DIAGNOSIS — R531 Weakness: Secondary | ICD-10-CM | POA: Diagnosis not present

## 2014-08-12 DIAGNOSIS — R269 Unspecified abnormalities of gait and mobility: Secondary | ICD-10-CM | POA: Insufficient documentation

## 2014-08-12 DIAGNOSIS — Z9181 History of falling: Secondary | ICD-10-CM | POA: Insufficient documentation

## 2014-08-12 NOTE — Therapy (Signed)
[address] [place], [place], [address] Phone: [phone]   Fax:  [phone]  Physical Therapy Treatment  Patient Details  Name: Larry Lopez MRN: [id number] Date of Birth: 16-Feb-1926 Referring Provider:  Alda Berthold, DO  Encounter Date: 08/12/2014      PT End of Session - 08/12/14 0839    Visit Number 9   Number of Visits 12   Date for PT Re-Evaluation 08/20/14   PT Start Time 0800   PT Stop Time 0839   PT Time Calculation (min) 39 min   Equipment Utilized During Treatment Gait belt   Activity Tolerance Patient tolerated treatment well      Past Medical History  Diagnosis Date  . CAD (coronary artery disease)   . HTN (hypertension)   . HLD (hyperlipidemia)   . Bradycardia   . Glaucoma   . Atrial fibrillation   . Tachy-brady syndrome     Past Surgical History  Procedure Laterality Date  . Tonsillectomy and adenoidectomy    . Coronary artery bypass graft    . Cardiac surgery    . Cardioversion N/A 02/06/2013    Procedure: CARDIOVERSION;  Surgeon: Jolaine Artist, MD;  Location: [facility] ENDOSCOPY;  Service: Cardiovascular;  Laterality: N/A;    There were no vitals filed for this visit.  Visit Diagnosis:  Risk for falls  Weakness  Abnormality of gait      Subjective Assessment - 08/12/14 0800    Subjective Doing good.Had my 23 birthday yesterday!!!!    Limitations Walking   Patient Stated Goals improve balance; prevent falls   Currently in Pain? No/denies   Multiple Pain Sites No                         [facility] Adult PT Treatment/Exercise - 08/12/14 0001    Knee/Hip Exercises: Aerobic   Nustep Level 6 X 8j   Knee/Hip Exercises: Machines for Strengthening   Cybex Knee Extension 35#X10   Cybex Knee Flexion 45#X10   Knee/Hip Exercises: Standing   Forward Lunges 5 sets  short stance with turns, hold bolster, increased challenge             Balance  Exercises - 08/12/14 0827    Balance Exercises: Standing   Sidestepping 5 reps  with lunge, reaches with bolster   Cone Rotation Right turn;Left turn  bolster reach cross lunges                PT Long Term Goals - 08/07/14 0843    PT LONG TERM GOAL #4   Title ambulate > 500' on various indoor/outdoor surfaces with < 2 episodes of shuffling/decreased foot clearance independently for improved mobility (08/06/14)   Status Partially Met               Plan - 08/12/14 0347    Clinical Impression Statement pt did well with balance challenges today. He will continue to work hard at home and gym.    Pt will benefit from skilled therapeutic intervention in order to improve on the following deficits Abnormal gait;Decreased balance;Decreased strength;Decreased safety awareness;Difficulty walking   Rehab Potential Good   PT Frequency 2x / week   PT Duration 6 weeks   PT Treatment/Interventions ADLs/Self Care Home Management;Therapeutic exercise;Therapeutic activities;Functional mobility training;Gait training;Stair training;Ultrasound;DME Instruction;Balance training;Neuromuscular re-education;Patient/family education   PT Next Visit Plan continue with balance interventions. 10th visit FOTO   Consulted and  Agree with Plan of Care Patient        Problem List Patient Active Problem List   Diagnosis Date Noted  . Coronary artery disease involving native coronary artery 11/05/2013  . Imbalance 11/05/2013  . Exertional dyspnea 08/06/2013  . Long term (current) use of anticoagulants 09/09/2010  . Routine health maintenance 08/22/2010  . HEARING LOSS 08/22/2009  . ATRIAL FIBRILLATION 06/27/2008  . BRADYCARDIA 03/21/2008  . VENOUS INSUFFICIENCY, LEGS 05/26/2007  . HYPERLIPIDEMIA 12/09/2006  . Essential hypertension 12/09/2006  . MYOCARDIAL INFARCTION, HX OF 12/09/2006  . CAD (coronary artery disease) 12/09/2006  . ATAXIA 12/09/2006     Natividad Brood, PTA  08/12/2014,  8:42 AM  [place], [place], [address] Phone: [phone]   Fax:  [phone]

## 2014-08-15 ENCOUNTER — Ambulatory Visit: Payer: Medicare Other | Admitting: Physical Therapy

## 2014-08-16 ENCOUNTER — Other Ambulatory Visit (HOSPITAL_COMMUNITY): Payer: Self-pay | Admitting: Internal Medicine

## 2014-10-08 ENCOUNTER — Other Ambulatory Visit: Payer: Self-pay | Admitting: Internal Medicine

## 2014-10-25 DIAGNOSIS — Z23 Encounter for immunization: Secondary | ICD-10-CM | POA: Diagnosis not present

## 2015-01-15 ENCOUNTER — Other Ambulatory Visit (HOSPITAL_COMMUNITY): Payer: Self-pay | Admitting: Internal Medicine

## 2015-01-16 DIAGNOSIS — H01001 Unspecified blepharitis right upper eyelid: Secondary | ICD-10-CM | POA: Diagnosis not present

## 2015-01-16 DIAGNOSIS — H4010X2 Unspecified open-angle glaucoma, moderate stage: Secondary | ICD-10-CM | POA: Diagnosis not present

## 2015-01-16 DIAGNOSIS — H04123 Dry eye syndrome of bilateral lacrimal glands: Secondary | ICD-10-CM | POA: Diagnosis not present

## 2015-01-16 DIAGNOSIS — H01004 Unspecified blepharitis left upper eyelid: Secondary | ICD-10-CM | POA: Diagnosis not present

## 2015-02-14 DIAGNOSIS — H4010X2 Unspecified open-angle glaucoma, moderate stage: Secondary | ICD-10-CM | POA: Diagnosis not present

## 2015-03-26 ENCOUNTER — Ambulatory Visit: Payer: Medicare Other | Admitting: Internal Medicine

## 2015-04-01 ENCOUNTER — Ambulatory Visit (INDEPENDENT_AMBULATORY_CARE_PROVIDER_SITE_OTHER)
Admission: RE | Admit: 2015-04-01 | Discharge: 2015-04-01 | Disposition: A | Discharge status: Home / Self Care | Payer: Medicare Other | Source: Ambulatory Visit | Attending: Internal Medicine | Admitting: Internal Medicine

## 2015-04-01 ENCOUNTER — Ambulatory Visit (INDEPENDENT_AMBULATORY_CARE_PROVIDER_SITE_OTHER): Payer: Medicare Other | Admitting: Internal Medicine

## 2015-04-01 ENCOUNTER — Other Ambulatory Visit (INDEPENDENT_AMBULATORY_CARE_PROVIDER_SITE_OTHER): Payer: Medicare Other

## 2015-04-01 ENCOUNTER — Encounter: Payer: Self-pay | Admitting: Internal Medicine

## 2015-04-01 VITALS — BP 122/80 | HR 98 | Temp 97.6°F | Resp 18 | Ht 71.0 in | Wt 152.8 lb

## 2015-04-01 DIAGNOSIS — R059 Cough, unspecified: Secondary | ICD-10-CM

## 2015-04-01 DIAGNOSIS — I10 Essential (primary) hypertension: Secondary | ICD-10-CM

## 2015-04-01 DIAGNOSIS — R7301 Impaired fasting glucose: Secondary | ICD-10-CM

## 2015-04-01 DIAGNOSIS — R05 Cough: Secondary | ICD-10-CM

## 2015-04-01 DIAGNOSIS — I4891 Unspecified atrial fibrillation: Secondary | ICD-10-CM | POA: Diagnosis not present

## 2015-04-01 DIAGNOSIS — E785 Hyperlipidemia, unspecified: Secondary | ICD-10-CM | POA: Diagnosis not present

## 2015-04-01 DIAGNOSIS — R2689 Other abnormalities of gait and mobility: Secondary | ICD-10-CM

## 2015-04-01 DIAGNOSIS — Z0000 Encounter for general adult medical examination without abnormal findings: Secondary | ICD-10-CM | POA: Diagnosis not present

## 2015-04-01 LAB — COMPREHENSIVE METABOLIC PANEL
ALBUMIN: 4.1 g/dL (ref 3.5–5.2)
ALT: 22 U/L (ref 0–53)
AST: 29 U/L (ref 0–37)
Alkaline Phosphatase: 103 U/L (ref 39–117)
BUN: 15 mg/dL (ref 6–23)
CHLORIDE: 104 meq/L (ref 96–112)
CO2: 28 meq/L (ref 19–32)
CREATININE: 0.92 mg/dL (ref 0.40–1.50)
Calcium: 9.4 mg/dL (ref 8.4–10.5)
GFR: 82.78 mL/min (ref >60.00)
Glucose, Bld: 93 mg/dL (ref 70–99)
Potassium: 4.3 mEq/L (ref 3.5–5.1)
SODIUM: 140 meq/L (ref 135–145)
Total Bilirubin: 1.2 mg/dL (ref 0.2–1.2)
Total Protein: 7.1 g/dL (ref 6.0–8.3)

## 2015-04-01 LAB — LIPID PANEL
CHOLESTEROL: 130 mg/dL (ref 0–200)
HDL: 47.3 mg/dL (ref >39.00)
LDL CALC: 66 mg/dL (ref 0–99)
NonHDL: 82.77
TRIGLYCERIDES: 86 mg/dL (ref 0.0–149.0)
Total CHOL/HDL Ratio: 3
VLDL: 17.2 mg/dL (ref 0.0–40.0)

## 2015-04-01 LAB — CBC
HCT: 46.3 % (ref 39.0–52.0)
Hemoglobin: 15.6 g/dL (ref 13.0–17.0)
MCHC: 33.6 g/dL (ref 30.0–36.0)
MCV: 91.6 fl (ref 78.0–100.0)
Platelets: 305 10*3/uL (ref 150.0–400.0)
RBC: 5.05 Mil/uL (ref 4.22–5.81)
RDW: 14.2 % (ref 11.5–15.5)
WBC: 8.1 10*3/uL (ref 4.0–10.5)

## 2015-04-01 LAB — HEMOGLOBIN A1C: Hgb A1c MFr Bld: 5.8 % (ref 4.6–6.5)

## 2015-04-01 MED ORDER — DOXYCYCLINE HYCLATE 100 MG PO TABS: 100.0000 mg | ORAL_TABLET | Freq: Two times a day (BID) | ORAL | Status: DC

## 2015-04-01 NOTE — Progress Notes (Signed)
Pre visit review using our clinic review tool, if applicable. No additional management support is needed unless otherwise documented below in the visit note. 

## 2015-04-01 NOTE — Patient Instructions (Signed)
We are checking the labs today and the chest x-ray.   We have sent in an antibiotic called doxycycline. Take 1 pill twice daily for 10 days to clear this infection.   Keep up the good work with being active and busy.   If the weight does not come back when you are feeling better call us and we may need to run more tests.   Health Maintenance, Male A healthy lifestyle and preventative care can promote health and wellness.  Maintain regular health, dental, and eye exams.  Eat a healthy diet. Foods like vegetables, fruits, whole grains, low-fat dairy products, and lean protein foods contain the nutrients you need and are low in calories. Decrease your intake of foods high in solid fats, added sugars, and salt. Get information about a proper diet from your health care provider, if necessary.  Regular physical exercise is one of the most important things you can do for your health. Most adults should get at least 150 minutes of moderate-intensity exercise (any activity that increases your heart rate and causes you to sweat) each week. In addition, most adults need muscle-strengthening exercises on 2 or more days a week.   Maintain a healthy weight. The body mass index (BMI) is a screening tool to identify possible weight problems. It provides an estimate of body fat based on height and weight. Your health care provider can find your BMI and can help you achieve or maintain a healthy weight. For males 20 years and older:  A BMI below 18.5 is considered underweight.  A BMI of 18.5 to 24.9 is normal.  A BMI of 25 to 29.9 is considered overweight.  A BMI of 30 and above is considered obese.  Maintain normal blood lipids and cholesterol by exercising and minimizing your intake of saturated fat. Eat a balanced diet with plenty of fruits and vegetables. Blood tests for lipids and cholesterol should begin at age 51 and be repeated every 5 years. If your lipid or cholesterol levels are high, you are  over age 48, or you are at high risk for heart disease, you may need your cholesterol levels checked more frequently.Ongoing high lipid and cholesterol levels should be treated with medicines if diet and exercise are not working.  If you smoke, find out from your health care provider how to quit. If you do not use tobacco, do not start.  Lung cancer screening is recommended for adults aged 69-80 years who are at high risk for developing lung cancer because of a history of smoking. A yearly low-dose CT scan of the lungs is recommended for people who have at least a 30-pack-year history of smoking and are current smokers or have quit within the past 15 years. A pack year of smoking is smoking an average of 1 pack of cigarettes a day for 1 year (for example, a 30-pack-year history of smoking could mean smoking 1 pack a day for 30 years or 2 packs a day for 15 years). Yearly screening should continue until the smoker has stopped smoking for at least 15 years. Yearly screening should be stopped for people who develop a health problem that would prevent them from having lung cancer treatment.  If you choose to drink alcohol, do not have more than 2 drinks per day. One drink is considered to be 12 oz (360 mL) of beer, 5 oz (150 mL) of wine, or 1.5 oz (45 mL) of liquor.  Avoid the use of street drugs. Do not share  needles with anyone. Ask for help if you need support or instructions about stopping the use of drugs.  High blood pressure causes heart disease and increases the risk of stroke. High blood pressure is more likely to develop in:  People who have blood pressure in the end of the normal range (100-139/85-89 mm Hg).  People who are overweight or obese.  People who are African American.  If you are 39-18 years of age, have your blood pressure checked every 3-5 years. If you are 44 years of age or older, have your blood pressure checked every year. You should have your blood pressure measured  twice--once when you are at a hospital or clinic, and once when you are not at a hospital or clinic. Record the average of the two measurements. To check your blood pressure when you are not at a hospital or clinic, you can use:  An automated blood pressure machine at a pharmacy.  A home blood pressure monitor.  If you are 91-55 years old, ask your health care provider if you should take aspirin to prevent heart disease.  Diabetes screening involves taking a blood sample to check your fasting blood sugar level. This should be done once every 3 years after age 34 if you are at a normal weight and without risk factors for diabetes. Testing should be considered at a younger age or be carried out more frequently if you are overweight and have at least 1 risk factor for diabetes.  Colorectal cancer can be detected and often prevented. Most routine colorectal cancer screening begins at the age of 68 and continues through age 83. However, your health care provider may recommend screening at an earlier age if you have risk factors for colon cancer. On a yearly basis, your health care provider may provide home test kits to check for hidden blood in the stool. A small camera at the end of a tube may be used to directly examine the colon (sigmoidoscopy or colonoscopy) to detect the earliest forms of colorectal cancer. Talk to your health care provider about this at age 40 when routine screening begins. A direct exam of the colon should be repeated every 5-10 years through age 30, unless early forms of precancerous polyps or small growths are found.  People who are at an increased risk for hepatitis B should be screened for this virus. You are considered at high risk for hepatitis B if:  You were born in a country where hepatitis B occurs often. Talk with your health care provider about which countries are considered high risk.  Your parents were born in a high-risk country and you have not received a shot to  protect against hepatitis B (hepatitis B vaccine).  You have HIV or AIDS.  You use needles to inject street drugs.  You live with, or have sex with, someone who has hepatitis B.  You are a man who has sex with other men (MSM).  You get hemodialysis treatment.  You take certain medicines for conditions like cancer, organ transplantation, and autoimmune conditions.  Hepatitis C blood testing is recommended for all people born from [birth year] through 1963 and any individual with known risk factors for hepatitis C.  Healthy men should no longer receive prostate-specific antigen (PSA) blood tests as part of routine cancer screening. Talk to your health care provider about prostate cancer screening.  Testicular cancer screening is not recommended for adolescents or adult males who have no symptoms. Screening includes self-exam, a health care  provider exam, and other screening tests. Consult with your health care provider about any symptoms you have or any concerns you have about testicular cancer.  Practice safe sex. Use condoms and avoid high-risk sexual practices to reduce the spread of sexually transmitted infections (STIs).  You should be screened for STIs, including gonorrhea and chlamydia if:  You are sexually active and are younger than 24 years.  You are older than 24 years, and your health care provider tells you that you are at risk for this type of infection.  Your sexual activity has changed since you were last screened, and you are at an increased risk for chlamydia or gonorrhea. Ask your health care provider if you are at risk.  If you are at risk of being infected with HIV, it is recommended that you take a prescription medicine daily to prevent HIV infection. This is called pre-exposure prophylaxis (PrEP). You are considered at risk if:  You are a man who has sex with other men (MSM).  You are a heterosexual man who is sexually active with multiple partners.  You take drugs by  injection.  You are sexually active with a partner who has HIV.  Talk with your health care provider about whether you are at high risk of being infected with HIV. If you choose to begin PrEP, you should first be tested for HIV. You should then be tested every 3 months for as long as you are taking PrEP.  Use sunscreen. Apply sunscreen liberally and repeatedly throughout the day. You should seek shade when your shadow is shorter than you. Protect yourself by wearing long sleeves, pants, a wide-brimmed hat, and sunglasses year round whenever you are outdoors.  Tell your health care provider of new moles or changes in moles, especially if there is a change in shape or color. Also, tell your health care provider if a mole is larger than the size of a pencil eraser.  A one-time screening for abdominal aortic aneurysm (AAA) and surgical repair of large AAAs by ultrasound is recommended for men aged 62-75 years who are current or former smokers.  Stay current with your vaccines (immunizations).   This information is not intended to replace advice given to you by your health care provider. Make sure you discuss any questions you have with your health care provider.   Document Released: 06/26/2007 Document Revised: 01/18/2014 Document Reviewed: 05/25/2010 Elsevier Interactive Patient Education [employer].

## 2015-04-01 NOTE — Progress Notes (Signed)
   Subjective:    Patient ID: Larry Lopez, male    DOB: [birth year], 82 y.o.   MRN: [id number]  HPI Here for medicare wellness. Please see A/P for status and treatment of chronic medical problems.  HPI #2: Having cough and fever over the last 2 weeks. Has not tried anything for it since that time. Overall improving slightly but still lots of coughing. Keeping him up at night. Some mild SOB. Coughing up yellow sputum. No sinus drainage or nasal congestion. Some sick contacts.   Diet: heart healthy Physical activity: active, but sedentary last 2 weeks Depression/mood screen: negative Hearing: moderate loss, has hearing aids bilaterally Visual acuity: grossly normal with lens, performs annual eye exam  ADLs: capable Fall risk: low due to some neuropathy in his legs Home safety: good Cognitive evaluation: intact to orientation, naming, recall and repetition EOL planning: adv directives discussed  I have personally reviewed and have noted 1. The patient's medical and social history - reviewed today no changes 2. Their use of alcohol, tobacco or illicit drugs 3. Their current medications and supplements 4. The patient's functional ability including ADL's, fall risks, home safety risks and hearing or visual impairment. 5. Diet and physical activities 6. Evidence for depression or mood disorders 7. Care team reviewed and updated (available in snapshot)  Review of Systems  Constitutional: Negative for fever, activity change, appetite change, fatigue and unexpected weight change.  HENT: Negative.   Respiratory: Positive for cough and shortness of breath. Negative for chest tightness and wheezing.   Cardiovascular: Negative for chest pain, palpitations and leg swelling.  Gastrointestinal: Negative for abdominal pain, diarrhea, constipation and abdominal distention.  Musculoskeletal: Negative.   Skin: Negative.   Neurological:       Imbalance  Psychiatric/Behavioral: Negative.         Objective:   Physical Exam  Constitutional: He is oriented to person, place, and time. He appears well-developed and well-nourished.  HENT:  Head: Normocephalic and atraumatic.  Eyes: EOM are normal.  Neck: Normal range of motion.  Cardiovascular: Normal rate and regular rhythm.   Pulmonary/Chest: Effort normal. No respiratory distress.  Some coarse rhonchi at the bases.   Abdominal: Soft. Bowel sounds are normal. He exhibits no distension. There is no tenderness. There is no rebound.  Musculoskeletal: He exhibits no edema.  Neurological: He is alert and oriented to person, place, and time.  Skin: Skin is warm and dry.  Psychiatric: He has a normal mood and affect.   Filed Vitals:   04/01/15 1306  BP: 122/80  Pulse: 98  Temp: 97.6 F (36.4 C)  TempSrc: Oral  Resp: 18  Height: 5\' 11"  (1.803 m)  Weight: 152 lb 12.8 oz (69.31 kg)  SpO2: 91%      Assessment & Plan:

## 2015-04-02 ENCOUNTER — Telehealth: Payer: Self-pay | Admitting: Internal Medicine

## 2015-04-02 ENCOUNTER — Telehealth: Payer: Self-pay | Admitting: *Deleted

## 2015-04-02 DIAGNOSIS — R059 Cough, unspecified: Secondary | ICD-10-CM | POA: Insufficient documentation

## 2015-04-02 DIAGNOSIS — R05 Cough: Secondary | ICD-10-CM | POA: Insufficient documentation

## 2015-04-02 MED ORDER — DOXYCYCLINE HYCLATE 100 MG PO CAPS: 100.0000 mg | ORAL_CAPSULE | Freq: Two times a day (BID) | ORAL | Status: DC

## 2015-04-02 NOTE — Telephone Encounter (Signed)
Per Dr. Sharlet Salina sent to [facility].

## 2015-04-02 NOTE — Assessment & Plan Note (Signed)
Colonoscopy aged out, Tetanus due in 2022, pneumonia and shingles completed. Flu done this season. Reminded about continuing his balance exercises and activity. Given 10 year screening recommendations at visit.

## 2015-04-02 NOTE — Assessment & Plan Note (Signed)
Checking lipid panel, on lipitor 40 mg daily and adjust as needed. No side effects.

## 2015-04-02 NOTE — Assessment & Plan Note (Signed)
Loss of appetite and 7 pounds, sputum with fevers concerning for CAP. Treating with doxycycline. Checking CXR today and adjust therapy as needed.

## 2015-04-02 NOTE — Telephone Encounter (Signed)
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

## 2015-04-02 NOTE — Assessment & Plan Note (Signed)
Checking labs today, BP at goal on HCTZ, metoprolol. Adjust as needed.

## 2015-04-02 NOTE — Assessment & Plan Note (Signed)
He has not decreased his alcohol intake and clarified that he and his wife split a bottle nightly usually and neurology felt this was the likely etiology. His balance is stable and he is generally doing his exercises.

## 2015-04-02 NOTE — Telephone Encounter (Signed)
Left msg yesterday stating received escript for Doxycycline tablets, but they are not available want to know can we change to capsule. Sent rx for capsule...Larry Lopez

## 2015-04-08 ENCOUNTER — Other Ambulatory Visit (HOSPITAL_COMMUNITY): Payer: Self-pay | Admitting: Cardiology

## 2015-04-08 MED ORDER — METOPROLOL TARTRATE 25 MG PO TABS: 25.0000 mg | ORAL_TABLET | ORAL | Status: DC | PRN

## 2015-04-08 MED ORDER — ATORVASTATIN CALCIUM 40 MG PO TABS: ORAL_TABLET | ORAL | Status: DC

## 2015-04-08 MED ORDER — HYDROCHLOROTHIAZIDE 25 MG PO TABS: ORAL_TABLET | ORAL | Status: DC

## 2015-05-11 ENCOUNTER — Ambulatory Visit (INDEPENDENT_AMBULATORY_CARE_PROVIDER_SITE_OTHER): Payer: Medicare Other | Admitting: Family Medicine

## 2015-05-11 VITALS — BP 124/70 | HR 88 | Temp 98.3°F | Resp 12 | Ht 71.0 in | Wt 154.0 lb

## 2015-05-11 DIAGNOSIS — J209 Acute bronchitis, unspecified: Secondary | ICD-10-CM

## 2015-05-11 DIAGNOSIS — R05 Cough: Secondary | ICD-10-CM

## 2015-05-11 DIAGNOSIS — I482 Chronic atrial fibrillation, unspecified: Secondary | ICD-10-CM

## 2015-05-11 DIAGNOSIS — R059 Cough, unspecified: Secondary | ICD-10-CM

## 2015-05-11 MED ORDER — GUAIFENESIN ER 1200 MG PO TB12: 1.0000 | ORAL_TABLET | Freq: Two times a day (BID) | ORAL | Status: DC | PRN

## 2015-05-11 MED ORDER — AZITHROMYCIN 250 MG PO TABS: ORAL_TABLET | ORAL | Status: DC

## 2015-05-11 MED ORDER — BENZONATATE 100 MG PO CAPS: 100.0000 mg | ORAL_CAPSULE | Freq: Three times a day (TID) | ORAL | Status: DC | PRN

## 2015-05-11 MED ORDER — LEVALBUTEROL HCL 0.63 MG/3ML IN NEBU
0.6300 mg | INHALATION_SOLUTION | Freq: Once | RESPIRATORY_TRACT | Status: AC
  Administered 2015-05-11: 0.63 mg via RESPIRATORY_TRACT

## 2015-05-11 NOTE — Progress Notes (Signed)
Subjective:    Patient ID: Larry Lopez, male    DOB: 18-Sep-1926, 82 y.o.   MRN: [id number] By signing my name below, I, Larry Lopez, attest that this documentation has been prepared under the direction and in the presence of Delman Cheadle, MD. Electronically Signed: Judithe Lopez, ER Scribe. 05/11/2015. 11:48 AM.  Chief Complaint  Patient presents with  . Cough    3 days  . Shortness of Breath  . Wheezing    HPI HPI Comments: Larry Lopez is a 82 y.o. male who presents to [facility] complaining of raspy voice and a mildly productive cough for two days. He denies CP, SOB, fatigue, fever or chills. He has not taken any OTC medications.    Past Medical History  Diagnosis Date  . CAD (coronary artery disease)   . HTN (hypertension)   . HLD (hyperlipidemia)   . Bradycardia   . Glaucoma   . Atrial fibrillation ([place])   . Tachy-brady syndrome ([place])    No Known Allergies  Current Outpatient Prescriptions on File Prior to Visit  Medication Sig Dispense Refill  . apixaban (ELIQUIS) 5 MG TABS tablet Take 1 tablet (5 mg total) by mouth 2 (two) times daily. 180 tablet 2  . aspirin 81 MG tablet Take 81 mg by mouth daily.      [name] atorvastatin (LIPITOR) 40 MG tablet TAKE 1 BY MOUTH DAILY CONTACT YOUR PROVIDER FOR AN APPOINTMENT 90 tablet 0  . B Complex-C (SUPER B COMPLEX PO) Take by mouth.    . Calcium Carbonate (CALCIUM 600) 1500 MG TABS Take 1 tablet by mouth daily.     [name] doxycycline (VIBRAMYCIN) 100 MG capsule Take 1 capsule (100 mg total) by mouth 2 (two) times daily. 20 capsule 0  . famotidine (PEPCID) 20 MG tablet Take 20 mg by mouth 2 (two) times daily. Reported on 04/01/2015    . hydrochlorothiazide (HYDRODIURIL) 25 MG tablet TAKE 1 BY MOUTH DAILY 90 tablet 0  . metoprolol tartrate (LOPRESSOR) 25 MG tablet Take 1 tablet (25 mg total) by mouth as needed. Only uses if needed for tachycardia. Has not needed in over a year. 90 tablet 2  . Multiple Vitamin (MULTIVITAMIN) tablet Take 1  tablet by mouth daily.     . nitroGLYCERIN (NITROSTAT) 0.4 MG SL tablet Place 0.4 mg under the tongue every 5 (five) minutes as needed for chest pain.     [name] timolol (BETIMOL) 0.5 % ophthalmic solution 1 drop 2 (two) times daily.     No current facility-administered medications on file prior to visit.    Review of Systems  Constitutional: Negative for fever, chills and diaphoresis.  HENT: Positive for congestion.   Respiratory: Positive for cough. Negative for shortness of breath.   Cardiovascular: Negative for chest pain and palpitations.      Objective:  BP 124/70 mmHg  Pulse 88  Temp(Src) 98.3 F (36.8 C) (Oral)  Resp 12  Ht 5\' 11"  (1.803 m)  Wt 154 lb (69.854 kg)  BMI 21.49 kg/m2  SpO2 98%  Physical Exam  Constitutional: He is oriented to person, place, and time. He appears well-developed and well-nourished. No distress.  HENT:  Head: Normocephalic and atraumatic.  Oropharynx with erythema. TMs normal.  Eyes: Pupils are equal, round, and reactive to light.  Neck: Neck supple.  Cardiovascular: Normal rate.   Irregularly irregular.   Pulmonary/Chest: Effort normal. No respiratory distress.  Inspiratory and expiratory rhonchi, worse bibasillarly.   Musculoskeletal: Normal range  of motion.  Neurological: He is alert and oriented to person, place, and time. Coordination normal.  Skin: Skin is warm and dry. He is not diaphoretic.  Psychiatric: He has a normal mood and affect. His behavior is normal.  Nursing note and vitals reviewed.  Recheck after xopenex neb has exam uncharged, and sx unchanged.      Assessment & Plan:   1. Cough   2. Chronic atrial fibrillation (HCC)   3. Acute bronchitis, unspecified organism      Meds ordered this encounter  Medications  . levalbuterol (XOPENEX) nebulizer solution 0.63 mg    Sig:   . azithromycin (ZITHROMAX) 250 MG tablet    Sig: Take 2 tabs PO x 1 dose, then 1 tab PO QD x 4 days    Dispense:  6 tablet    Refill:  0  .  benzonatate (TESSALON) 100 MG capsule    Sig: Take 1-2 capsules (100-200 mg total) by mouth 3 (three) times daily as needed for cough.    Dispense:  60 capsule    Refill:  0  . Guaifenesin (MUCINEX MAXIMUM STRENGTH) 1200 MG TB12    Sig: Take 1 tablet (1,200 mg total) by mouth every 12 (twelve) hours as needed.    Dispense:  14 tablet    Refill:  1    I personally performed the services described in this documentation, which was scribed in my presence. The recorded information has been reviewed and considered, and addended by me as needed.  Delman Cheadle, MD MPH

## 2015-05-11 NOTE — Patient Instructions (Addendum)
   IF you received an x-ray today, you will receive an invoice from [facility]. Please contact  Radiology at [phone] with questions or concerns regarding your invoice.   IF you received labwork today, you will receive an invoice from [facility]. Please contact [facility] at [phone] with questions or concerns regarding your invoice.   Our billing staff will not be able to assist you with questions regarding bills from these companies.  You will be contacted with the lab results as soon as they are available. The fastest way to get your results is to activate your My Chart account. Instructions are located on the last page of this paperwork. If you have not heard from us regarding the results in 2 weeks, please contact this office.    Acute Bronchitis Bronchitis is inflammation of the airways that extend from the windpipe into the lungs (bronchi). The inflammation often causes mucus to develop. This leads to a cough, which is the most common symptom of bronchitis.  In acute bronchitis, the condition usually develops suddenly and goes away over time, usually in a couple weeks. Smoking, allergies, and asthma can make bronchitis worse. Repeated episodes of bronchitis may cause further lung problems.  CAUSES Acute bronchitis is most often caused by the same virus that causes a cold. The virus can spread from person to person (contagious) through coughing, sneezing, and touching contaminated objects. SIGNS AND SYMPTOMS   Cough.   Fever.   Coughing up mucus.   Body aches.   Chest congestion.   Chills.   Shortness of breath.   Sore throat.  DIAGNOSIS  Acute bronchitis is usually diagnosed through a physical exam. Your health care provider will also ask you questions about your medical history. Tests, such as chest X-rays, are sometimes done to rule out other conditions.  TREATMENT  Acute bronchitis usually goes away in a  couple weeks. Oftentimes, no medical treatment is necessary. Medicines are sometimes given for relief of fever or cough. Antibiotic medicines are usually not needed but may be prescribed in certain situations. In some cases, an inhaler may be recommended to help reduce shortness of breath and control the cough. A cool mist vaporizer may also be used to help thin bronchial secretions and make it easier to clear the chest.  HOME CARE INSTRUCTIONS  Get plenty of rest.   Drink enough fluids to keep your urine clear or pale yellow (unless you have a medical condition that requires fluid restriction). Increasing fluids may help thin your respiratory secretions (sputum) and reduce chest congestion, and it will prevent dehydration.   Take medicines only as directed by your health care provider.  If you were prescribed an antibiotic medicine, finish it all even if you start to feel better.  Avoid smoking and secondhand smoke. Exposure to cigarette smoke or irritating chemicals will make bronchitis worse. If you are a smoker, consider using nicotine gum or skin patches to help control withdrawal symptoms. Quitting smoking will help your lungs heal faster.   Reduce the chances of another bout of acute bronchitis by washing your hands frequently, avoiding people with cold symptoms, and trying not to touch your hands to your mouth, nose, or eyes.   Keep all follow-up visits as directed by your health care provider.  SEEK MEDICAL CARE IF: Your symptoms do not improve after 1 week of treatment.  SEEK IMMEDIATE MEDICAL CARE IF:  You develop an increased fever or chills.   You have chest pain.     You have severe shortness of breath.  You have bloody sputum.   You develop dehydration.  You faint or repeatedly feel like you are going to pass out.  You develop repeated vomiting.  You develop a severe headache. MAKE SURE YOU:   Understand these instructions.  Will watch your  condition.  Will get help right away if you are not doing well or get worse.   This information is not intended to replace advice given to you by your health care provider. Make sure you discuss any questions you have with your health care provider.   Document Released: 02/05/2004 Document Revised: 01/18/2014 Document Reviewed: 06/20/2012 Elsevier Interactive Patient Education 2016 Else[employer] Inc[employer]  

## 2015-05-12 DIAGNOSIS — H4010X2 Unspecified open-angle glaucoma, moderate stage: Secondary | ICD-10-CM | POA: Diagnosis not present

## 2015-05-12 DIAGNOSIS — Z961 Presence of intraocular lens: Secondary | ICD-10-CM | POA: Diagnosis not present

## 2015-05-19 ENCOUNTER — Other Ambulatory Visit (HOSPITAL_COMMUNITY): Payer: Self-pay | Admitting: Internal Medicine

## 2015-05-20 ENCOUNTER — Other Ambulatory Visit (HOSPITAL_COMMUNITY): Payer: Self-pay | Admitting: *Deleted

## 2015-05-20 MED ORDER — ATORVASTATIN CALCIUM 40 MG PO TABS: ORAL_TABLET | ORAL | Status: DC

## 2015-07-16 ENCOUNTER — Other Ambulatory Visit (HOSPITAL_COMMUNITY): Payer: Self-pay | Admitting: Internal Medicine

## 2015-08-05 DIAGNOSIS — C4441 Basal cell carcinoma of skin of scalp and neck: Secondary | ICD-10-CM | POA: Diagnosis not present

## 2015-08-05 DIAGNOSIS — C44529 Squamous cell carcinoma of skin of other part of trunk: Secondary | ICD-10-CM | POA: Diagnosis not present

## 2015-08-05 DIAGNOSIS — L218 Other seborrheic dermatitis: Secondary | ICD-10-CM | POA: Diagnosis not present

## 2015-08-05 DIAGNOSIS — Z85828 Personal history of other malignant neoplasm of skin: Secondary | ICD-10-CM | POA: Diagnosis not present

## 2015-08-05 DIAGNOSIS — D0439 Carcinoma in situ of skin of other parts of face: Secondary | ICD-10-CM | POA: Diagnosis not present

## 2015-08-05 DIAGNOSIS — D485 Neoplasm of uncertain behavior of skin: Secondary | ICD-10-CM | POA: Diagnosis not present

## 2015-08-05 DIAGNOSIS — L570 Actinic keratosis: Secondary | ICD-10-CM | POA: Diagnosis not present

## 2015-08-19 ENCOUNTER — Other Ambulatory Visit (HOSPITAL_COMMUNITY): Payer: Self-pay | Admitting: Internal Medicine

## 2015-09-19 DIAGNOSIS — Z961 Presence of intraocular lens: Secondary | ICD-10-CM | POA: Diagnosis not present

## 2015-09-19 DIAGNOSIS — H52203 Unspecified astigmatism, bilateral: Secondary | ICD-10-CM | POA: Diagnosis not present

## 2015-09-19 DIAGNOSIS — H4010X2 Unspecified open-angle glaucoma, moderate stage: Secondary | ICD-10-CM | POA: Diagnosis not present

## 2015-09-25 DIAGNOSIS — Z85828 Personal history of other malignant neoplasm of skin: Secondary | ICD-10-CM | POA: Diagnosis not present

## 2015-09-25 DIAGNOSIS — C44529 Squamous cell carcinoma of skin of other part of trunk: Secondary | ICD-10-CM | POA: Diagnosis not present

## 2015-09-25 DIAGNOSIS — C4441 Basal cell carcinoma of skin of scalp and neck: Secondary | ICD-10-CM | POA: Diagnosis not present

## 2015-09-30 ENCOUNTER — Telehealth: Payer: Self-pay | Admitting: Internal Medicine

## 2015-09-30 NOTE — Telephone Encounter (Signed)
°  Patient Name: Larry Lopez  DOB: 1926/08/17    Initial Comment Caller states has cold and sore throat, also has a cough   Nurse Assessment  Nurse: Julien Girt, RN, Almyra Free Date/Time Eilene Ghazi Time): 09/30/2015 8:18:12 AM  Confirm and document reason for call. If symptomatic, describe symptoms. You must click the next button to save text entered. ---Caller states he has a cold, sore throat and a cough.  Has the patient traveled out of the country within the last 30 days? ---Not Applicable  Does the patient have any new or worsening symptoms? ---Yes  Will a triage be completed? ---Yes  Related visit to physician within the last 2 weeks? ---No  Does the PT have any chronic conditions? (i.e. diabetes, asthma, etc.) ---Yes  List chronic conditions. ---High Cholesterol, Htn, A fib  Is this a behavioral health or substance abuse call? ---No     Guidelines    Guideline Title Affirmed Question Affirmed Notes  Cough - Acute Non-Productive Earache is present    Final Disposition User   See Physician within 24 Hours Julien Girt, RN, Almyra Free    Referrals  REFERRED TO PCP OFFICE   Disagree/Comply: Leta Baptist

## 2015-10-14 ENCOUNTER — Other Ambulatory Visit (HOSPITAL_COMMUNITY): Payer: Self-pay | Admitting: Internal Medicine

## 2015-10-16 DIAGNOSIS — Z23 Encounter for immunization: Secondary | ICD-10-CM | POA: Diagnosis not present

## 2015-11-06 ENCOUNTER — Telehealth (HOSPITAL_COMMUNITY): Payer: Self-pay | Admitting: Vascular Surgery

## 2015-11-06 NOTE — Telephone Encounter (Signed)
Pt called to speak to [facility] , he did no specify what he wanted he just wants to speak to [employer]

## 2015-11-07 NOTE — Telephone Encounter (Signed)
Spoke w/pt, he just wanted to sch f/u w/Dr Bensimhon, appt sch for 12/1

## 2015-11-07 NOTE — Telephone Encounter (Signed)
Left message to call back  

## 2015-11-25 ENCOUNTER — Other Ambulatory Visit (HOSPITAL_COMMUNITY): Payer: Self-pay | Admitting: Internal Medicine

## 2015-12-12 ENCOUNTER — Ambulatory Visit (HOSPITAL_COMMUNITY)
Admission: RE | Admit: 2015-12-12 | Discharge: 2015-12-12 | Disposition: A | Discharge status: Home / Self Care | Payer: Medicare Other | Source: Ambulatory Visit | Attending: Internal Medicine | Admitting: Internal Medicine

## 2015-12-12 ENCOUNTER — Encounter (HOSPITAL_COMMUNITY): Payer: Self-pay | Admitting: Internal Medicine

## 2015-12-12 VITALS — BP 118/72 | HR 75 | Wt 159.5 lb

## 2015-12-12 DIAGNOSIS — I2510 Atherosclerotic heart disease of native coronary artery without angina pectoris: Secondary | ICD-10-CM | POA: Diagnosis not present

## 2015-12-12 DIAGNOSIS — G629 Polyneuropathy, unspecified: Secondary | ICD-10-CM | POA: Insufficient documentation

## 2015-12-12 DIAGNOSIS — Z951 Presence of aortocoronary bypass graft: Secondary | ICD-10-CM | POA: Insufficient documentation

## 2015-12-12 DIAGNOSIS — Z79899 Other long term (current) drug therapy: Secondary | ICD-10-CM | POA: Diagnosis not present

## 2015-12-12 DIAGNOSIS — I480 Paroxysmal atrial fibrillation: Secondary | ICD-10-CM | POA: Diagnosis not present

## 2015-12-12 DIAGNOSIS — Z7982 Long term (current) use of aspirin: Secondary | ICD-10-CM | POA: Diagnosis not present

## 2015-12-12 DIAGNOSIS — I2583 Coronary atherosclerosis due to lipid rich plaque: Secondary | ICD-10-CM

## 2015-12-12 DIAGNOSIS — E785 Hyperlipidemia, unspecified: Secondary | ICD-10-CM | POA: Insufficient documentation

## 2015-12-12 DIAGNOSIS — I482 Chronic atrial fibrillation: Secondary | ICD-10-CM | POA: Diagnosis not present

## 2015-12-12 DIAGNOSIS — I10 Essential (primary) hypertension: Secondary | ICD-10-CM | POA: Diagnosis not present

## 2015-12-12 NOTE — Progress Notes (Signed)
Patient ID: [name], male   DOB: 1926-10-28, 82 y.o.   MRN: [id number]  HPI:  Larry Lopez ('[name]") is a 82 y.o. male  with a history of a coronary artery disease status post redo bypass surgery in 2002 by Dr. Cyndia Bent.[name] He also has a history of hypertension and hyperlipidemia, PAF complicated by  tachy-brady syndrome with significant resting bradycardia prohibiting AV-Nodal blockade. Was hospitalized in 6/10 with rapid AF.  He saw Dr. Caryl Comes previously for consideration of pacemaker but it was thought that as AF is infrequent and self-limiting and his bradycardia is asymptomatic  that we would watch it for now and avoid AV-nodal blockers due to bradycardia. If AF burden increased would then consider pacing and re-insitutuion of AV-nodal blockers.  On 02/06/13 underwent DC-CV for recurrent AF but quickly reverted back to AF.   He had Lexiscan Myoview 08/08/13 EF 56% Echo read as 40-45% (Dr. Haroldine Laws reviewed personally and thought EF closer to 50-55%.  He presents today for regular follow up. Weight stable from last visit (up 1 lb). Having neuropathy pains, and has some difficult walking. Balance is terrible. Has a shuffling gait. Wife constantly (and lovingly) correcting. Does his exercises for balance everyday. Does body weight and small dumbbell (5lb) exercises and Y provided exercises multiple times a week. Felt tachypalpitations about a week ago, took metoprolol with rapid relief. Feeling his age more. Can walk the 200 yards without SOB. Feels like any increased work of breathing he has is appropriate for the level of activity. No orthopnea, bendopnea, or CP. No DOE.  Occasional lightheadedness or dizziness with rapid standing.  Lab Results  Component Value Date   CHOL 130 04/01/2015   HDL 47.30 04/01/2015   LDLCALC 66 04/01/2015   TRIG 86.0 04/01/2015   CHOLHDL 3 04/01/2015    ROS: All systems negative except as listed in HPI, PMH and Problem List.  Past Medical History:   Diagnosis Date  . Atrial fibrillation ([place])   . Bradycardia   . CAD (coronary artery disease)   . Glaucoma   . HLD (hyperlipidemia)   . HTN (hypertension)   . Tachy-brady syndrome [facility])     Current Outpatient Prescriptions  Medication Sig Dispense Refill  . apixaban (ELIQUIS) 5 MG TABS tablet Take 1 tablet (5 mg total) by mouth 2 (two) times daily. 180 tablet 2  . aspirin 81 MG tablet Take 81 mg by mouth daily.      [name] atorvastatin (LIPITOR) 40 MG tablet TAKE 1 BY MOUTH DAILY CONTACT YOUR PROVIDER FOR AN APPOINTMENT 90 tablet 0  . B Complex-C (SUPER B COMPLEX PO) Take by mouth.    . Calcium Carbonate (CALCIUM 600) 1500 MG TABS Take 1 tablet by mouth daily.     . famotidine (PEPCID) 20 MG tablet Take 20 mg by mouth 2 (two) times daily. Reported on 04/01/2015    . hydrochlorothiazide (HYDRODIURIL) 25 MG tablet TAKE ONE TABLET BY MOUTH ONCE DAILY 90 tablet 3  . metoprolol tartrate (LOPRESSOR) 25 MG tablet Take 1 tablet (25 mg total) by mouth as needed. Only uses if needed for tachycardia. Has not needed in over a year. 90 tablet 2  . Multiple Vitamin (MULTIVITAMIN) tablet Take 1 tablet by mouth daily.     . timolol (BETIMOL) 0.5 % ophthalmic solution 1 drop 2 (two) times daily.    . nitroGLYCERIN (NITROSTAT) 0.4 MG SL tablet Place 0.4 mg under the tongue every 5 (five) minutes as needed for chest pain.  No current facility-administered medications for this encounter.     PHYSICAL EXAM: Vitals:   12/12/15 0855  BP: 118/72  Pulse: 75  SpO2: 99%  Weight: 159 lb 8 oz (72.3 kg)   Wt Readings from Last 3 Encounters:  12/12/15 159 lb 8 oz (72.3 kg)  05/11/15 154 lb (69.9 kg)  04/01/15 152 lb 12.8 oz (69.3 kg)    General:  Well appearing. Thin. No resp difficulty HEENT: normal Neck: supple. JVP flat. Carotids 2+ bilaterally; no bruits. No lymphadenopathy or thryomegaly appreciated. Cor: PMI normal. Irregular rate & rhythm. No rubs, gallops or murmurs. Lungs: clear Abdomen: soft,  nontender, nondistended. No hepatosplenomegaly. No bruits or masses. Good bowel sounds. Extremities: no cyanosis, clubbing, rash, edema Femoral pulses 2+ bilaterally. DPs 2+  Neuro: alert & orientedx3, cranial nerves grossly intact. Moves all 4 extremities w/o difficulty. Affect pleasant.   ECG: Afib, 79 bpm  ASSESSMENT & PLAN:  1. Chronic AF on Apixaban 5 bid (may need to reduce dose if weight goes lower or renal function changes) 2. HTN - well controlled 3. H/o tachy brady syndrome stable 4. CAD s/p re-do CABG 2002 5. Hyperlipidemia - followed by PCP 6. Neuropathy  He is stable. AF well controlled.  Main issue remains balance training. Will send to [facility] PT and also Neurology for formal evaluation. Doing well with eliquis but goes into donut hole  Shirley Friar, [place] 9:08 AM  Patient seen and examined with Oda Kilts, PA-C. We discussed all aspects of the encounter. I agree with the assessment and plan as stated above.   From a cardiac perspective he is doing very well. He has chronic rate-controlled AF which is well tolerated. Continue Eliquis. BP looks very good. Continue with exercise and balance training. No evidence of PAD.   Brunilda Eble,MD 10:09 AM

## 2015-12-26 DIAGNOSIS — L570 Actinic keratosis: Secondary | ICD-10-CM | POA: Diagnosis not present

## 2015-12-26 DIAGNOSIS — Z85828 Personal history of other malignant neoplasm of skin: Secondary | ICD-10-CM | POA: Diagnosis not present

## 2015-12-26 DIAGNOSIS — L218 Other seborrheic dermatitis: Secondary | ICD-10-CM | POA: Diagnosis not present

## 2016-04-07 DIAGNOSIS — H52203 Unspecified astigmatism, bilateral: Secondary | ICD-10-CM | POA: Diagnosis not present

## 2016-04-07 DIAGNOSIS — Z961 Presence of intraocular lens: Secondary | ICD-10-CM | POA: Diagnosis not present

## 2016-04-07 DIAGNOSIS — H4010X2 Unspecified open-angle glaucoma, moderate stage: Secondary | ICD-10-CM | POA: Diagnosis not present

## 2016-04-07 DIAGNOSIS — H40243 Residual stage of angle-closure glaucoma, bilateral: Secondary | ICD-10-CM | POA: Diagnosis not present

## 2016-04-27 ENCOUNTER — Other Ambulatory Visit (INDEPENDENT_AMBULATORY_CARE_PROVIDER_SITE_OTHER): Payer: Medicare Other

## 2016-04-27 ENCOUNTER — Encounter: Payer: Self-pay | Admitting: Internal Medicine

## 2016-04-27 ENCOUNTER — Ambulatory Visit (INDEPENDENT_AMBULATORY_CARE_PROVIDER_SITE_OTHER): Payer: Medicare Other | Admitting: Internal Medicine

## 2016-04-27 VITALS — BP 130/72 | HR 70 | Temp 98.6°F | Resp 14 | Ht 71.0 in | Wt 154.0 lb

## 2016-04-27 DIAGNOSIS — E538 Deficiency of other specified B group vitamins: Secondary | ICD-10-CM

## 2016-04-27 DIAGNOSIS — Z0000 Encounter for general adult medical examination without abnormal findings: Secondary | ICD-10-CM | POA: Diagnosis not present

## 2016-04-27 DIAGNOSIS — I10 Essential (primary) hypertension: Secondary | ICD-10-CM | POA: Diagnosis not present

## 2016-04-27 DIAGNOSIS — E785 Hyperlipidemia, unspecified: Secondary | ICD-10-CM | POA: Diagnosis not present

## 2016-04-27 DIAGNOSIS — H9193 Unspecified hearing loss, bilateral: Secondary | ICD-10-CM

## 2016-04-27 LAB — COMPREHENSIVE METABOLIC PANEL
ALT: 16 U/L (ref 0–53)
AST: 24 U/L (ref 0–37)
Albumin: 4.3 g/dL (ref 3.5–5.2)
Alkaline Phosphatase: 106 U/L (ref 39–117)
BUN: 16 mg/dL (ref 6–23)
CHLORIDE: 103 meq/L (ref 96–112)
CO2: 29 meq/L (ref 19–32)
Calcium: 9.7 mg/dL (ref 8.4–10.5)
Creatinine, Ser: 0.86 mg/dL (ref 0.40–1.50)
GFR: 89.26 mL/min (ref >60.00)
GLUCOSE: 80 mg/dL (ref 70–99)
POTASSIUM: 4.2 meq/L (ref 3.5–5.1)
SODIUM: 140 meq/L (ref 135–145)
Total Bilirubin: 1.4 mg/dL — ABNORMAL HIGH (ref 0.2–1.2)
Total Protein: 7.2 g/dL (ref 6.0–8.3)

## 2016-04-27 LAB — LIPID PANEL
CHOLESTEROL: 145 mg/dL (ref 0–200)
HDL: 58.2 mg/dL (ref >39.00)
LDL CALC: 72 mg/dL (ref 0–99)
NonHDL: 86.68
TRIGLYCERIDES: 74 mg/dL (ref 0.0–149.0)
Total CHOL/HDL Ratio: 2
VLDL: 14.8 mg/dL (ref 0.0–40.0)

## 2016-04-27 LAB — CBC
HEMATOCRIT: 46.4 % (ref 39.0–52.0)
HEMOGLOBIN: 15.5 g/dL (ref 13.0–17.0)
MCHC: 33.5 g/dL (ref 30.0–36.0)
MCV: 92.7 fl (ref 78.0–100.0)
PLATELETS: 210 10*3/uL (ref 150.0–400.0)
RBC: 5 Mil/uL (ref 4.22–5.81)
RDW: 15 % (ref 11.5–15.5)
WBC: 6.7 10*3/uL (ref 4.0–10.5)

## 2016-04-27 LAB — TSH: TSH: 2.19 u[IU]/mL (ref 0.35–4.50)

## 2016-04-27 LAB — VITAMIN B12: Vitamin B-12: 564 pg/mL (ref 211–911)

## 2016-04-27 NOTE — Assessment & Plan Note (Signed)
Moderate loss even with bilateral aids due to lack of clarity of speech sounds. Manages at this time.

## 2016-04-27 NOTE — Assessment & Plan Note (Signed)
Taking lipitor 40 mg daily, checking lipid panel and adjust for goal LDL <100.

## 2016-04-27 NOTE — Progress Notes (Signed)
Pre visit review using our clinic review tool, if applicable. No additional management support is needed unless otherwise documented below in the visit note. 

## 2016-04-27 NOTE — Assessment & Plan Note (Signed)
BP at goal on hctz, metoprolol and checking CMP and adjust as needed.

## 2016-04-27 NOTE — Patient Instructions (Signed)
The new shingles shot is called shingrix and you can ask at the pharmacy what the cost will be.    Health Maintenance, Male A healthy lifestyle and preventive care is important for your health and wellness. Ask your health care provider about what schedule of regular examinations is right for you. What should I know about weight and diet?  Eat a Healthy Diet  Eat plenty of vegetables, fruits, whole grains, low-fat dairy products, and lean protein.  Do not eat a lot of foods high in solid fats, added sugars, or salt. Maintain a Healthy Weight  Regular exercise can help you achieve or maintain a healthy weight. You should:  Do at least 150 minutes of exercise each week. The exercise should increase your heart rate and make you sweat (moderate-intensity exercise).  Do strength-training exercises at least twice a week. Watch Your Levels of Cholesterol and Blood Lipids  Have your blood tested for lipids and cholesterol every 5 years starting at 81 years of age. If you are at high risk for heart disease, you should start having your blood tested when you are 81 years old. You may need to have your cholesterol levels checked more often if:  Your lipid or cholesterol levels are high.  You are older than 81 years of age.  You are at high risk for heart disease. What should I know about cancer screening? Many types of cancers can be detected early and may often be prevented. Lung Cancer  You should be screened every year for lung cancer if:  You are a current smoker who has smoked for at least 30 years.  You are a former smoker who has quit within the past 15 years.  Talk to your health care provider about your screening options, when you should start screening, and how often you should be screened. Colorectal Cancer  Routine colorectal cancer screening usually begins at 81 years of age and should be repeated every 5-10 years until you are 81 years old. You may need to be screened more  often if early forms of precancerous polyps or small growths are found. Your health care provider may recommend screening at an earlier age if you have risk factors for colon cancer.  Your health care provider may recommend using home test kits to check for hidden blood in the stool.  A small camera at the end of a tube can be used to examine your colon (sigmoidoscopy or colonoscopy). This checks for the earliest forms of colorectal cancer. Prostate and Testicular Cancer  Depending on your age and overall health, your health care provider may do certain tests to screen for prostate and testicular cancer.  Talk to your health care provider about any symptoms or concerns you have about testicular or prostate cancer. Skin Cancer  Check your skin from head to toe regularly.  Tell your health care provider about any new moles or changes in moles, especially if:  There is a change in a mole's size, shape, or color.  You have a mole that is larger than a pencil eraser.  Always use sunscreen. Apply sunscreen liberally and repeat throughout the day.  Protect yourself by wearing long sleeves, pants, a wide-brimmed hat, and sunglasses when outside. What should I know about heart disease, diabetes, and high blood pressure?  If you are 75-76 years of age, have your blood pressure checked every 3-5 years. If you are 54 years of age or older, have your blood pressure checked every year. You should  have your blood pressure measured twice-once when you are at a hospital or clinic, and once when you are not at a hospital or clinic. Record the average of the two measurements. To check your blood pressure when you are not at a hospital or clinic, you can use:  An automated blood pressure machine at a pharmacy.  A home blood pressure monitor.  Talk to your health care provider about your target blood pressure.  If you are between 32-16 years old, ask your health care provider if you should take aspirin  to prevent heart disease.  Have regular diabetes screenings by checking your fasting blood sugar level.  If you are at a normal weight and have a low risk for diabetes, have this test once every three years after the age of 15.  If you are overweight and have a high risk for diabetes, consider being tested at a younger age or more often.  A one-time screening for abdominal aortic aneurysm (AAA) by ultrasound is recommended for men aged 62-75 years who are current or former smokers. What should I know about preventing infection? Hepatitis B  If you have a higher risk for hepatitis B, you should be screened for this virus. Talk with your health care provider to find out if you are at risk for hepatitis B infection. Hepatitis C  Blood testing is recommended for:  Everyone born from [birth year] through 1963.  Anyone with known risk factors for hepatitis C. Sexually Transmitted Diseases (STDs)  You should be screened each year for STDs including gonorrhea and chlamydia if:  You are sexually active and are younger than 81 years of age.  You are older than 81 years of age and your health care provider tells you that you are at risk for this type of infection.  Your sexual activity has changed since you were last screened and you are at an increased risk for chlamydia or gonorrhea. Ask your health care provider if you are at risk.  Talk with your health care provider about whether you are at high risk of being infected with HIV. Your health care provider may recommend a prescription medicine to help prevent HIV infection. What else can I do?  Schedule regular health, dental, and eye exams.  Stay current with your vaccines (immunizations).  Do not use any tobacco products, such as cigarettes, chewing tobacco, and e-cigarettes. If you need help quitting, ask your health care provider.  Limit alcohol intake to no more than 2 drinks per day. One drink equals 12 ounces of beer, 5 ounces of wine, or  1 ounces of hard liquor.  Do not use street drugs.  Do not share needles.  Ask your health care provider for help if you need support or information about quitting drugs.  Tell your health care provider if you often feel depressed.  Tell your health care provider if you have ever been abused or do not feel safe at home. This information is not intended to replace advice given to you by your health care provider. Make sure you discuss any questions you have with your health care provider. Document Released: 06/26/2007 Document Revised: 08/27/2015 Document Reviewed: 10/01/2014 Elsevier Interactive Patient Education  2017 [employer].

## 2016-04-27 NOTE — Assessment & Plan Note (Signed)
Aged out of colonoscopy, tetanus and flu and pneumonia up to date. Counseled about the shingrix vaccine. Counseled about sun safety and mole surveillance. Given 10 year screening recommendations.

## 2016-04-27 NOTE — Progress Notes (Signed)
   Subjective:    Patient ID: Larry Lopez, male    DOB: 1926/04/25, 83 y.o.   MRN: [id number]  HPI Here for medicare wellness, no new complaints. Please see A/P for status and treatment of chronic medical problems.   HPI #2: Here for follow up of cholesterol (taking lipitor 40 mg daily, no side effects, denies muscle aches, no new cardiac or stroke symptoms), and his blood pressure (taking hctz and metoprolol daily without side effects, BP at goal, no headaches, chest pains, SOB).   Diet: heart healthy Physical activity: active Depression/mood screen: negative Hearing: moderate loss with bilateral hearing aids Visual acuity: grossly normal with lens, performs annual eye exam  ADLs: capable Fall risk: low Home safety: good Cognitive evaluation: intact to orientation, naming, recall and repetition EOL planning: adv directives discussed  I have personally reviewed and have noted 1. The patient's medical and social history - reviewed today no changes 2. Their use of alcohol, tobacco or illicit drugs 3. Their current medications and supplements 4. The patient's functional ability including ADL's, fall risks, home safety risks and hearing or visual impairment. 5. Diet and physical activities 6. Evidence for depression or mood disorders 7. Care team reviewed and updated (available in snapshot)  Review of Systems  Constitutional: Negative.   HENT: Negative.   Eyes: Negative.   Respiratory: Negative for cough, chest tightness and shortness of breath.   Cardiovascular: Negative for chest pain, palpitations and leg swelling.  Gastrointestinal: Negative for abdominal distention, abdominal pain, constipation, diarrhea, nausea and vomiting.  Musculoskeletal: Negative.   Skin: Negative.   Neurological: Negative.   Psychiatric/Behavioral: Negative.       Objective:   Physical Exam  Constitutional: He is oriented to person, place, and time. He appears well-developed and well-nourished.    HENT:  Head: Normocephalic and atraumatic.  Eyes: EOM are normal.  Neck: Normal range of motion.  Cardiovascular: Normal rate and regular rhythm.   Pulmonary/Chest: Effort normal and breath sounds normal. No respiratory distress. He has no wheezes. He has no rales.  Abdominal: Soft. Bowel sounds are normal. He exhibits no distension. There is no tenderness. There is no rebound.  Musculoskeletal: He exhibits no edema.  Neurological: He is alert and oriented to person, place, and time. Coordination normal.  Skin: Skin is warm and dry.  Psychiatric: He has a normal mood and affect.   Vitals:   04/27/16 1306  BP: 130/72  Pulse: 70  Resp: 14  Temp: 98.6 F (37 C)  TempSrc: Oral  SpO2: 99%  Weight: 154 lb (69.9 kg)  Height: 5\' 11"  (1.803 m)      Assessment & Plan:

## 2016-06-09 DIAGNOSIS — Z85828 Personal history of other malignant neoplasm of skin: Secondary | ICD-10-CM | POA: Diagnosis not present

## 2016-06-09 DIAGNOSIS — C44529 Squamous cell carcinoma of skin of other part of trunk: Secondary | ICD-10-CM | POA: Diagnosis not present

## 2016-06-09 DIAGNOSIS — D485 Neoplasm of uncertain behavior of skin: Secondary | ICD-10-CM | POA: Diagnosis not present

## 2016-07-19 ENCOUNTER — Ambulatory Visit (INDEPENDENT_AMBULATORY_CARE_PROVIDER_SITE_OTHER): Payer: Medicare Other | Admitting: Family Medicine

## 2016-07-19 ENCOUNTER — Other Ambulatory Visit: Payer: Self-pay

## 2016-07-19 ENCOUNTER — Other Ambulatory Visit: Payer: Self-pay | Admitting: Family Medicine

## 2016-07-19 ENCOUNTER — Inpatient Hospital Stay (HOSPITAL_COMMUNITY)
Admission: EM | Admit: 2016-07-19 | Discharge: 2016-07-21 | DRG: 183 | Disposition: A | Discharge status: Home / Self Care | Payer: Medicare Other | Attending: General Surgery | Admitting: General Surgery

## 2016-07-19 ENCOUNTER — Ambulatory Visit (INDEPENDENT_AMBULATORY_CARE_PROVIDER_SITE_OTHER): Payer: Medicare Other

## 2016-07-19 ENCOUNTER — Ambulatory Visit (HOSPITAL_COMMUNITY)
Admission: RE | Admit: 2016-07-19 | Discharge: 2016-07-19 | Disposition: A | Discharge status: Home / Self Care | Payer: Medicare Other | Source: Ambulatory Visit | Attending: Family Medicine | Admitting: Family Medicine

## 2016-07-19 ENCOUNTER — Encounter (HOSPITAL_COMMUNITY): Payer: Self-pay | Admitting: Emergency Medicine

## 2016-07-19 ENCOUNTER — Encounter: Payer: Self-pay | Admitting: Family Medicine

## 2016-07-19 ENCOUNTER — Other Ambulatory Visit (HOSPITAL_COMMUNITY): Payer: Self-pay | Admitting: Cardiology

## 2016-07-19 VITALS — BP 150/81 | HR 82 | Resp 18 | Ht 71.0 in | Wt 155.4 lb

## 2016-07-19 DIAGNOSIS — I82220 Acute embolism and thrombosis of inferior vena cava: Secondary | ICD-10-CM | POA: Diagnosis present

## 2016-07-19 DIAGNOSIS — Y92009 Unspecified place in unspecified non-institutional (private) residence as the place of occurrence of the external cause: Principal | ICD-10-CM

## 2016-07-19 DIAGNOSIS — R0789 Other chest pain: Secondary | ICD-10-CM

## 2016-07-19 DIAGNOSIS — S2241XA Multiple fractures of ribs, right side, initial encounter for closed fracture: Secondary | ICD-10-CM | POA: Diagnosis not present

## 2016-07-19 DIAGNOSIS — I2510 Atherosclerotic heart disease of native coronary artery without angina pectoris: Secondary | ICD-10-CM | POA: Diagnosis not present

## 2016-07-19 DIAGNOSIS — S36029A Unspecified contusion of spleen, initial encounter: Secondary | ICD-10-CM | POA: Diagnosis not present

## 2016-07-19 DIAGNOSIS — R3129 Other microscopic hematuria: Secondary | ICD-10-CM

## 2016-07-19 DIAGNOSIS — X58XXXA Exposure to other specified factors, initial encounter: Secondary | ICD-10-CM

## 2016-07-19 DIAGNOSIS — S36039A Unspecified laceration of spleen, initial encounter: Secondary | ICD-10-CM | POA: Diagnosis not present

## 2016-07-19 DIAGNOSIS — S51011A Laceration without foreign body of right elbow, initial encounter: Secondary | ICD-10-CM

## 2016-07-19 DIAGNOSIS — S2231XA Fracture of one rib, right side, initial encounter for closed fracture: Secondary | ICD-10-CM | POA: Diagnosis not present

## 2016-07-19 DIAGNOSIS — R935 Abnormal findings on diagnostic imaging of other abdominal regions, including retroperitoneum: Secondary | ICD-10-CM

## 2016-07-19 DIAGNOSIS — W19XXXA Unspecified fall, initial encounter: Secondary | ICD-10-CM

## 2016-07-19 DIAGNOSIS — R188 Other ascites: Secondary | ICD-10-CM | POA: Diagnosis not present

## 2016-07-19 DIAGNOSIS — R1011 Right upper quadrant pain: Secondary | ICD-10-CM

## 2016-07-19 DIAGNOSIS — R109 Unspecified abdominal pain: Secondary | ICD-10-CM

## 2016-07-19 DIAGNOSIS — S2242XA Multiple fractures of ribs, left side, initial encounter for closed fracture: Secondary | ICD-10-CM | POA: Diagnosis not present

## 2016-07-19 DIAGNOSIS — Z66 Do not resuscitate: Secondary | ICD-10-CM | POA: Diagnosis present

## 2016-07-19 DIAGNOSIS — K469 Unspecified abdominal hernia without obstruction or gangrene: Secondary | ICD-10-CM | POA: Insufficient documentation

## 2016-07-19 DIAGNOSIS — K8020 Calculus of gallbladder without cholecystitis without obstruction: Secondary | ICD-10-CM

## 2016-07-19 DIAGNOSIS — Z7982 Long term (current) use of aspirin: Secondary | ICD-10-CM

## 2016-07-19 DIAGNOSIS — R938 Abnormal findings on diagnostic imaging of other specified body structures: Secondary | ICD-10-CM | POA: Insufficient documentation

## 2016-07-19 DIAGNOSIS — Z951 Presence of aortocoronary bypass graft: Secondary | ICD-10-CM | POA: Diagnosis not present

## 2016-07-19 DIAGNOSIS — Z7901 Long term (current) use of anticoagulants: Secondary | ICD-10-CM

## 2016-07-19 DIAGNOSIS — Z79899 Other long term (current) drug therapy: Secondary | ICD-10-CM

## 2016-07-19 DIAGNOSIS — I482 Chronic atrial fibrillation, unspecified: Secondary | ICD-10-CM

## 2016-07-19 DIAGNOSIS — I10 Essential (primary) hypertension: Secondary | ICD-10-CM | POA: Diagnosis present

## 2016-07-19 DIAGNOSIS — W01190A Fall on same level from slipping, tripping and stumbling with subsequent striking against furniture, initial encounter: Secondary | ICD-10-CM | POA: Diagnosis present

## 2016-07-19 DIAGNOSIS — E785 Hyperlipidemia, unspecified: Secondary | ICD-10-CM | POA: Diagnosis present

## 2016-07-19 DIAGNOSIS — H409 Unspecified glaucoma: Secondary | ICD-10-CM | POA: Diagnosis present

## 2016-07-19 LAB — CBC WITH DIFFERENTIAL/PLATELET
Basophils Absolute: 0 10*3/uL (ref 0.0–0.1)
Basophils Relative: 0 %
EOS ABS: 0 10*3/uL (ref 0.0–0.7)
Eosinophils Relative: 0 %
HEMATOCRIT: 47 % (ref 39.0–52.0)
HEMOGLOBIN: 15.7 g/dL (ref 13.0–17.0)
LYMPHS ABS: 1.9 10*3/uL (ref 0.7–4.0)
Lymphocytes Relative: 22 %
MCH: 31.2 pg (ref 26.0–34.0)
MCHC: 33.4 g/dL (ref 30.0–36.0)
MCV: 93.4 fL (ref 78.0–100.0)
Monocytes Absolute: 0.8 10*3/uL (ref 0.1–1.0)
Monocytes Relative: 9 %
NEUTROS ABS: 6 10*3/uL (ref 1.7–7.7)
NEUTROS PCT: 69 %
Platelets: 180 10*3/uL (ref 150–400)
RBC: 5.03 MIL/uL (ref 4.22–5.81)
RDW: 15.2 % (ref 11.5–15.5)
WBC: 8.8 10*3/uL (ref 4.0–10.5)

## 2016-07-19 LAB — POCT URINALYSIS DIP (MANUAL ENTRY)
Bilirubin, UA: NEGATIVE
GLUCOSE UA: NEGATIVE mg/dL
Ketones, POC UA: NEGATIVE mg/dL
LEUKOCYTES UA: NEGATIVE
NITRITE UA: NEGATIVE
Protein Ur, POC: NEGATIVE mg/dL
Spec Grav, UA: <=1.005 — AB (ref 1.010–1.025)
Urobilinogen, UA: 0.2 E.U./dL
pH, UA: 5 (ref 5.0–8.0)

## 2016-07-19 LAB — POCT CBC
Granulocyte percent: 70.2 %G (ref 37–80)
HEMATOCRIT: 45.7 % (ref 43.5–53.7)
HEMOGLOBIN: 15.6 g/dL (ref 14.1–18.1)
Lymph, poc: 1.8 (ref 0.6–3.4)
MCH: 31.3 pg — AB (ref 27–31.2)
MCHC: 34.2 g/dL (ref 31.8–35.4)
MCV: 91.5 fL (ref 80–97)
MID (CBC): 0.8 (ref 0–0.9)
MPV: 7.4 fL (ref 0–99.8)
POC GRANULOCYTE: 6.1 (ref 2–6.9)
POC LYMPH PERCENT: 20.2 %L (ref 10–50)
POC MID %: 9.6 % (ref 0–12)
Platelet Count, POC: 220 10*3/uL (ref 142–424)
RBC: 5 M/uL (ref 4.69–6.13)
RDW, POC: 15.3 %
WBC: 8.7 10*3/uL (ref 4.6–10.2)

## 2016-07-19 LAB — CREATININE, SERUM
CREATININE: 1 mg/dL (ref 0.61–1.24)
GFR calc Af Amer: >60 mL/min (ref >60)
GFR calc non Af Amer: >60 mL/min (ref >60)

## 2016-07-19 LAB — BASIC METABOLIC PANEL
Anion gap: 10 (ref 5–15)
BUN: 10 mg/dL (ref 6–20)
CHLORIDE: 99 mmol/L — AB (ref 101–111)
CO2: 26 mmol/L (ref 22–32)
Calcium: 9.2 mg/dL (ref 8.9–10.3)
Creatinine, Ser: 0.94 mg/dL (ref 0.61–1.24)
GFR calc non Af Amer: >60 mL/min (ref >60)
Glucose, Bld: 113 mg/dL — ABNORMAL HIGH (ref 65–99)
POTASSIUM: 4.1 mmol/L (ref 3.5–5.1)
SODIUM: 135 mmol/L (ref 135–145)

## 2016-07-19 LAB — TYPE AND SCREEN
ABO/RH(D): B NEG
ANTIBODY SCREEN: NEGATIVE

## 2016-07-19 LAB — HEMOGLOBIN AND HEMATOCRIT, BLOOD
HCT: 43 % (ref 39.0–52.0)
Hemoglobin: 14.7 g/dL (ref 13.0–17.0)

## 2016-07-19 LAB — PROTIME-INR
INR: 1.13
PROTHROMBIN TIME: 14.6 s (ref 11.4–15.2)

## 2016-07-19 LAB — BUN: BUN: 10 mg/dL (ref 6–20)

## 2016-07-19 LAB — ABO/RH: ABO/RH(D): B NEG

## 2016-07-19 MED ORDER — HYDROCHLOROTHIAZIDE 25 MG PO TABS
25.0000 mg | ORAL_TABLET | Freq: Every day | ORAL | Status: DC
  Administered 2016-07-20 – 2016-07-21 (×2): 25 mg via ORAL
  Filled 2016-07-19 (×2): qty 1

## 2016-07-19 MED ORDER — APIXABAN 5 MG PO TABS: 5.0000 mg | ORAL_TABLET | Freq: Two times a day (BID) | ORAL | 2 refills | Status: DC

## 2016-07-19 MED ORDER — IOPAMIDOL (ISOVUE-370) INJECTION 76%
INTRAVENOUS | Status: AC
  Administered 2016-07-19: 100 mL
  Filled 2016-07-19: qty 100

## 2016-07-19 MED ORDER — HYDROCODONE-ACETAMINOPHEN 5-325 MG PO TABS: 0.5000 | ORAL_TABLET | Freq: Four times a day (QID) | ORAL | 0 refills | Status: DC | PRN

## 2016-07-19 NOTE — Patient Instructions (Addendum)
Hydrocodone if needed for pain - use lowest effective dose. Do not combine with other sedating medicines or alcohol and be careful with falls with this medicine as well.   We will check ultrasound today to look at liver/kidneys. Recheck in 4-5 days, sooner if any new symptoms such as shortness of breath or other worsening.    Rib Fracture A rib fracture is a break or crack in one of the bones of the ribs. The ribs are a group of long, curved bones that wrap around your chest and attach to your spine. They protect your lungs and other organs in the chest cavity. A broken or cracked rib is often painful, but most do not cause other problems. Most rib fractures heal on their own over time. However, rib fractures can be more serious if multiple ribs are broken or if broken ribs move out of place and push against other structures. What are the causes?  A direct blow to the chest. For example, this could happen during contact sports, a car accident, or a fall against a hard object.  Repetitive movements with high force, such as pitching a baseball or having severe coughing spells. What are the signs or symptoms?  Pain when you breathe in or cough.  Pain when someone presses on the injured area. How is this diagnosed? Your caregiver will perform a physical exam. Various imaging tests may be ordered to confirm the diagnosis and to look for related injuries. These tests may include a chest X-ray, computed tomography (CT), magnetic resonance imaging (MRI), or a bone scan. How is this treated? Rib fractures usually heal on their own in 1-3 months. The longer healing period is often associated with a continued cough or other aggravating activities. During the healing period, pain control is very important. Medication is usually given to control pain. Hospitalization or surgery may be needed for more severe injuries, such as those in which multiple ribs are broken or the ribs have moved out of  place. Follow these instructions at home:  Avoid strenuous activity and any activities or movements that cause pain. Be careful during activities and avoid bumping the injured rib.  Gradually increase activity as directed by your caregiver.  Only take over-the-counter or prescription medications as directed by your caregiver. Do not take other medications without asking your caregiver first.  Apply ice to the injured area for the first 1-2 days after you have been treated or as directed by your caregiver. Applying ice helps to reduce inflammation and pain. ? Put ice in a plastic bag. ? Place a towel between your skin and the bag. ? Leave the ice on for 15-20 minutes at a time, every 2 hours while you are awake.  Perform deep breathing as directed by your caregiver. This will help prevent pneumonia, which is a common complication of a broken rib. Your caregiver may instruct you to: ? Take deep breaths several times a day. ? Try to cough several times a day, holding a pillow against the injured area. ? Use a device called an incentive spirometer to practice deep breathing several times a day.  Drink enough fluids to keep your urine clear or pale yellow. This will help you avoid constipation.  Do not wear a rib belt or binder. These restrict breathing, which can lead to pneumonia. Get help right away if:  You have a fever.  You have difficulty breathing or shortness of breath.  You develop a continual cough, or you cough up thick  or bloody sputum.  You feel sick to your stomach (nausea), throw up (vomit), or have abdominal pain.  You have worsening pain not controlled with medications. This information is not intended to replace advice given to you by your health care provider. Make sure you discuss any questions you have with your health care provider. Document Released: 12/28/2004 Document Revised: 06/05/2015 Document Reviewed: 03/01/2012 Elsevier Interactive Patient Education   2018 [place].  Skin Tear Care A skin tear is a wound in which the top layer of skin peels off. To repair the skin, your doctor may use:  Tape.  Skin adhesive strips.  A bandage (dressing) may also be placed over the tape or skin adhesive strips. How to care for your skin tear  Clean the wound as told by your doctor. You may be told to keep the wound dry for the first few days. If you are told to clean the wound: ? Wash the wound with mild soap and water or a salt-water (saline) solution. ? Rinse the wound with water to remove all soap. ? Do not rub the wound dry. Let the wound air dry.  Change any dressings as told by your doctor. This includes changing the dressing if it gets wet, gets dirty, or starts to smell bad.  Do not scratch or pick at the wound.  Protect the injured area until it has healed.  Check your wound every day for signs of infection. Check for: ? More redness, swelling, or pain. ? More fluid or blood. ? Warmth. ? Pus or a bad smell. Medicines   Take over-the-counter and prescription medicines only as told by your doctor.  If you were prescribed an antibiotic medicine, take or apply it as told by your doctor. Do not stop using the antibiotic even if your condition gets better. General instructions  Keep the bandage dry as told by your doctor.  Do not take baths, swim, or do anything that puts your wound underwater until your doctor says it is okay.  Keep all follow-up visits as told by your doctor. This is important. Contact a doctor if:  You have more redness, swelling, or pain around your wound.  You have more fluid or blood coming from your wound.  Your wound feels warm to the touch.  You have pus or a bad smell coming from your wound. Get help right away if:  You have a red streak that goes away from the skin tear.  You have a fever and chills and your symptoms suddenly get worse. This information is not intended to replace advice given  to you by your health care provider. Make sure you discuss any questions you have with your health care provider. Document Released: 10/07/2007 Document Revised: 08/24/2015 Document Reviewed: 11/18/2014 Elsevier Interactive Patient Education  2018 [employer].    IF you received an x-ray today, you will receive an invoice from [facility]. Please contact [facility] at [phone] with questions or concerns regarding your invoice.   IF you received labwork today, you will receive an invoice from [facility] [facility]. Please contact [facility] at [phone] with questions or concerns regarding your invoice.   Our billing staff will not be able to assist you with questions regarding bills from these companies.  You will be contacted with the lab results as soon as they are available. The fastest way to get your results is to activate your My Chart account. Instructions are located on the last page of this paperwork. If you have  not heard from [name] regarding the results in 2 weeks, please contact this office.

## 2016-07-19 NOTE — ED Provider Notes (Signed)
[facility] DEPT Provider Note   CSN: [id number] Arrival date & time: 07/19/16  2021     History   Chief Complaint Chief Complaint  Patient presents with  . Flank Pain    rib cage pain    HPI Larry Lopez is a 83 y.o. male.  The history is provided by the patient.  He has a history of atrial fibrillation and is anticoagulated on apixaban. Yesterday, he fell at home striking his right rib cage on the arm of the chair. He was having pain with movement-especially when rolling over in bed. He saw his PCP today who ordered rib x-rays which showed probable fracture. An ultrasound was ordered which showed questionable haziness in the inferior vena cava so he was sent for CT scan which showed evidence of spleen hematoma. He was then referred to the ED. He denies nausea or vomiting. Pain is at 0/10 if he holds still, 8/10 with movement or coughing. He denies other injury.  Past Medical History:  Diagnosis Date  . Atrial fibrillation ([place])   . Bradycardia   . CAD (coronary artery disease)   . Glaucoma   . HLD (hyperlipidemia)   . HTN (hypertension)   . Tachy-brady syndrome [facility])     Patient Active Problem List   Diagnosis Date Noted  . Coronary artery disease involving native coronary artery 11/05/2013  . Imbalance 11/05/2013  . Routine health maintenance 08/22/2010  . Hearing loss 08/22/2009  . ATRIAL FIBRILLATION 06/27/2008  . Hyperlipidemia 12/09/2006  . Essential hypertension 12/09/2006  . MYOCARDIAL INFARCTION, HX OF 12/09/2006  . CAD (coronary artery disease) 12/09/2006    Past Surgical History:  Procedure Laterality Date  . CARDIAC SURGERY    . CARDIOVERSION N/A 02/06/2013   Procedure: CARDIOVERSION;  Surgeon: Jolaine Artist, MD;  Location: [facility] ENDOSCOPY;  Service: Cardiovascular;  Laterality: N/A;  . CORONARY ARTERY BYPASS GRAFT    . TONSILLECTOMY AND ADENOIDECTOMY         Home Medications    Prior to Admission medications   Medication Sig Start Date  End Date Taking? Authorizing Provider  apixaban (ELIQUIS) 5 MG TABS tablet Take 1 tablet (5 mg total) by mouth 2 (two) times daily. 07/19/16   Bensimhon, Shaune Pascal, MD  aspirin 81 MG tablet Take 81 mg by mouth daily.      [provider]  atorvastatin (LIPITOR) 40 MG tablet TAKE 1 BY MOUTH DAILY CONTACT YOUR PROVIDER FOR AN APPOINTMENT 05/20/15   Bensimhon, Shaune Pascal, MD  B Complex-C (SUPER B COMPLEX PO) Take by mouth.    [provider]  Calcium Carbonate (CALCIUM 600) 1500 MG TABS Take 1 tablet by mouth daily.     [provider]  famotidine (PEPCID) 20 MG tablet Take 20 mg by mouth 2 (two) times daily. Reported on 04/01/2015    [provider]  hydrochlorothiazide (HYDRODIURIL) 25 MG tablet TAKE ONE TABLET BY MOUTH ONCE DAILY 10/14/15   Bensimhon, Shaune Pascal, MD  HYDROcodone-acetaminophen (NORCO/VICODIN) 5-325 MG tablet Take 0.5-1 tablets by mouth every 6 (six) hours as needed for moderate pain. 07/19/16   Wendie Agreste, MD  metoprolol tartrate (LOPRESSOR) 25 MG tablet Take 1 tablet (25 mg total) by mouth as needed. Only uses if needed for tachycardia. Has not needed in over a year. 04/08/15   Bensimhon, Shaune Pascal, MD  Multiple Vitamin (MULTIVITAMIN) tablet Take 1 tablet by mouth daily.     [provider]  nitroGLYCERIN (NITROSTAT) 0.4 MG SL tablet Place  0.4 mg under the tongue every 5 (five) minutes as needed for chest pain.     [provider]  timolol (BETIMOL) 0.5 % ophthalmic solution 1 drop 2 (two) times daily.    [provider]    Family History Family History  Problem Relation Age of Onset  . Cancer Father        Deceased, 17  . Breast cancer Mother        Deceased, 11  . Breast cancer Sister   . Colon cancer Daughter        Living  . Diabetes Neg Hx   . Coronary artery disease Neg Hx     Social History Social History  Substance Use Topics  . Smoking status: Former Smoker    Quit date: 01/12/1967  . Smokeless tobacco:  Never Used  . Alcohol use 16.8 oz/week    28 Standard drinks or equivalent per week     Comment: 1 bottle of wine daily     Allergies   Patient has no known allergies.   Review of Systems Review of Systems  All other systems reviewed and are negative.    Physical Exam Updated Vital Signs BP (!) 154/81   Pulse 83   Temp 98.6 F (37 C) (Oral)   Resp 18   Ht 5\' 11"  (1.803 m)   Wt 70.3 kg (155 lb)   SpO2 100%   BMI 21.62 kg/m   Physical Exam  Nursing note and vitals reviewed.  83 year old male, resting comfortably and in no acute distress. Vital signs are significant for hypertension. Oxygen saturation is 100%, which is normal. Head is normocephalic and atraumatic. PERRLA, EOMI. Oropharynx is clear. Neck is nontender and supple without adenopathy or JVD. Back is nontender and there is no CVA tenderness. Lungs are clear without rales, wheezes, or rhonchi. Chest is moderately tender in the right lower lateral rib cage. There is no crepitus. Heart has regular rate and rhythm without murmur. Abdomen is soft, flat, with moderate tenderness to deep palpation in the left upper quadrant. There are no masses or hepatosplenomegaly and peristalsis is normoactive. Extremities have no cyanosis or edema, full range of motion is present. Skin is warm and dry without rash. Neurologic: Mental status is normal, cranial nerves are intact, there are no motor or sensory deficits.  ED Treatments / Results  Labs (all labs ordered are listed, but only abnormal results are displayed) Labs Reviewed  CBC WITH DIFFERENTIAL/PLATELET  PROTIME-INR  BASIC METABOLIC PANEL  HEMOGLOBIN AND HEMATOCRIT, BLOOD  TYPE AND SCREEN    EKG  EKG Interpretation  Date/Time:  Monday July 19 2016 21:20:34 EDT Ventricular Rate:  97 PR Interval:    QRS Duration: 84 QT Interval:  332 QTC Calculation: 421 R Axis:   40 Text Interpretation:  Atrial fibrillation Nonspecific ST and T wave abnormality Abnormal  ECG When compared with ECG of 12/12/2015, No significant change was found Confirmed by Delora Fuel ([id number]) on 07/19/2016 9:32:07 PM       Radiology Dg Ribs Unilateral W/chest Right  Result Date: 07/19/2016 CLINICAL DATA:  Right posterior chest wall pain after fall last night. EXAM: RIGHT RIBS AND CHEST - 3+ VIEW COMPARISON:  Chest x-ray 04/01/2015 FINDINGS: The lungs are clear without focal pneumonia, edema, pneumothorax or pleural effusion. Hyperexpansion is stable. Scarring at the left base is unchanged. Oblique views of the right ribs show a fracture of the lateral right ninth rib. Fracture line is indistinct with some adjacent  lucency. Oblique film suggests that there may be associated nondisplaced eighth rib fracture. IMPRESSION: Fracture of the right lateral ninth rib has poorly defined margins and a suggestion of bony erosion. This may be due to subacute injury. No soft tissue nodule or gross bony destruction to suggest pathologic fracture. There is probably an associated nondisplaced right eighth rib fracture. Consider follow-up films to ensure appropriate healing. Electronically Signed   By: Misty Stanley M.D.   On: 07/19/2016 12:42   US Abdomen Complete  Result Date: 07/19/2016 CLINICAL DATA:  Right eighth and ninth rib fractures after fall yesterday. Right upper quadrant abdomen pain. EXAM: ABDOMEN ULTRASOUND COMPLETE COMPARISON:  None. FINDINGS: Gallbladder: Gallstones are noted in the gallbladder. No wall thickening visualized. No sonographic Murphy sign noted by sonographer. Common bile duct: Diameter: 3.6 mm Liver: No focal lesion identified. Within normal limits in parenchymal echogenicity. IVC: Echogenic material is identified in the proximal IVC. Pancreas: Visualized portion unremarkable. Spleen: Size and appearance within normal limits. Right Kidney: Length: 11.4 cm. Echogenicity within normal limits. No mass or hydronephrosis visualized. Left Kidney: Length: 9.8 cm. Echogenicity within  normal limits. No mass or hydronephrosis visualized. Abdominal aorta: No aneurysm visualized. Other findings: Ascites is noted around the spleen. IMPRESSION: Echogenic material is identified in the proximal IVC. Thrombus within the IVC is not excluded. Suggest CT with contrast for further evaluation. Ascites around the spleen. Cholelithiasis without sonographic evidence of acute cholecystitis. These results will be called to the ordering clinician or representative by the Radiologist Assistant, and communication documented in the PACS or zVision Dashboard. Electronically Signed   By: Abelardo Diesel M.D.   On: 07/19/2016 16:24   Ct Angio Abd/pel W/ And/or W/o  Addendum Date: 07/19/2016   ADDENDUM REPORT: 07/19/2016 20:40 ADDENDUM: This patient was taken by the CT technologist to the emergency department at approximately 2010 hours on 07/19/2016. Electronically Signed   By: Misty Stanley M.D.   On: 07/19/2016 20:40   Result Date: 07/19/2016 CLINICAL DATA:  Question of IVC thrombus on ultrasound exam earlier today. EXAM: CTA ABDOMEN AND PELVIS wITHOUT AND WITH CONTRAST TECHNIQUE: Multidetector CT imaging of the abdomen and pelvis was performed using the standard protocol during bolus administration of intravenous contrast. Multiplanar reconstructed images and MIPs were obtained and reviewed to evaluate the vascular anatomy. CONTRAST:  100 cc Isovue 370 COMPARISON:  Ultrasound exam 07/19/2016 FINDINGS: Lower chest:  Coronary artery calcification evident. Hepatobiliary: No focal abnormality within the liver parenchyma. Gallbladder is distended with a tiny dependent stone evident. No intrahepatic or extrahepatic biliary dilation. Pancreas: No focal mass lesion. No dilatation of the main duct. No intraparenchymal cyst. No peripancreatic edema. Spleen: Small amount of fluid around the dome of the spleen may be subcapsular and is compatible with hemorrhage. Adrenals/Urinary Tract: No adrenal nodule or mass. Kidneys are  unremarkable. No evidence for hydroureter. The urinary bladder appears normal for the degree of distention. Stomach/Bowel: Stomach is nondistended. No gastric wall thickening. No evidence of outlet obstruction. Duodenum is normally positioned as is the ligament of Treitz. No small bowel wall thickening. No small bowel dilatation. The terminal ileum is normal. The appendix is normal. Diverticular changes are noted in the left colon without evidence of diverticulitis. Vascular/Lymphatic: There is abdominal aortic atherosclerosis without aneurysm. Although no clot is evident within the lumen of the IVC near the liver as suggested on the ultrasound exam earlier today, the patient does have adherent thrombus in the IVC below the right renal vein (see image 38 series  6 and coronal image 56 of series 11). This nonocclusive thrombus extends about along the right lateral wall of the IVC extending 3.5 cm proximal to the right renal vein and measuring about 6 mm in thickness. Portal vein and superior mesenteric vein are patent. Splenic vein is patent. Dense calcific plaque is seen at the origin of the SMA although it does opacify. Reproductive: Prostate gland is enlarged. Other: No intraperitoneal free fluid. Musculoskeletal: Right groin hernia contains only fat. Fractures of the lateral right eighth and ninth ribs are identified (images 59 and 74 of series 4). IMPRESSION: 1. Fluid around the upper spleen compatible with hemorrhage. Given the well-defined margins, this may be subcapsular potential small laceration along the medial aspect of the splenic dome. 2. Adherent thrombus along the right lateral wall of the IVC proximal to (caudal) the right renal vein confluence. This thrombus is nonobstructing and appears to be adherent to the wall of the IVC. 3. Nondisplaced fractures of the right lateral eighth and ninth ribs. 4. No free intraperitoneal fluid or evidence for pneumoperitoneum. 5. Right groin hernia contains only fat.  Electronically Signed: By: Misty Stanley M.D. On: 07/19/2016 19:50    Procedures Procedures (including critical care time)  Medications Ordered in ED Medications - No data to display   Initial Impression / Assessment and Plan / ED Course  I have reviewed the triage vital signs and the nursing notes.  Pertinent labs & imaging results that were available during my care of the patient were reviewed by me and considered in my medical decision making (see chart for details).  Fall at home with closed rib fractures on the right, splenic hematoma in a patient who is anticoagulated. He appears to be hemodynamically stable. Injury occurred yesterday, and he is not tachycardic or hypotensive. He will need to be admitted for observation with serial hemoglobins. Case is discussed with Dr. Donne Hazel of trauma surgery service who agrees to admit the patient.  CHA2DS2/VAS Stroke Risk Points      4 >= 2 Points: High Risk  1 - 1.99 Points: Medium Risk  0 Points: Low Risk    The patient's score has not changed in the past year.:  No Change         Details    Note: External data might be a factor in metrics not marked with    Points Metrics   This score determines the patient's risk of having a stroke if the  patient has atrial fibrillation.       0 Has Congestive Heart Failure:  No   1 Has Vascular Disease:  Yes   1 Has Hypertension:  Yes   2 Age:  17   0 Has Diabetes:  No   0 Had Stroke:  No Had TIA:  No Had thromboembolism:  No   0 Male:  No          Final Clinical Impressions(s) / ED Diagnoses   Final diagnoses:  Fall at home, initial encounter  Spleen hematoma, initial encounter  Closed fracture of two ribs, right, initial encounter  Atrial fibrillation, chronic ([place])    New Prescriptions New Prescriptions   No medications on file     Delora Fuel, MD 94/49/67 [id number]

## 2016-07-19 NOTE — Progress Notes (Signed)
Subjective:  By signing my name below, I, Essence Howell, attest that this documentation has been prepared under the direction and in the presence of Wendie Agreste, MD Electronically Signed: Ladene Artist, ED Scribe 07/19/2016 at 12:03 PM.   Patient ID: Larry Lopez, male    DOB: 02-12-1926, 83 y.o.   MRN: [id number]  Chief Complaint  Patient presents with  . Back Pain    after a fall last night, states he tripped over furniture, pain on R side of back   HPI Larry Lopez is a 83 y.o. male who presents to Primary Care at [facility] complaining of right-sided back pain s/p a fall that occurred last night. H/o CAD, a-fib, HTN, hyperlipidemia and balance issues in the past with alcohol use. Undergoing rehab for peripheral neuropathy and risk of falls. PCP: Hoyt Koch, MD.   Pt states that he was picking up some wine glasses when he fell between a chair and a [employer]. He reports having a few glasses of wine prior to the fall; he averages half a bottle of wine daily. Pt reports increased pain with coughing, changing positions and with riding over bumps in the road while in the car. He denies sob, difficulty breathing, hematuria, new weakness or imbalances; states he shuffles his feet when he walks.   Patient Active Problem List   Diagnosis Date Noted  . Coronary artery disease involving native coronary artery 11/05/2013  . Imbalance 11/05/2013  . Routine health maintenance 08/22/2010  . Hearing loss 08/22/2009  . ATRIAL FIBRILLATION 06/27/2008  . Hyperlipidemia 12/09/2006  . Essential hypertension 12/09/2006  . MYOCARDIAL INFARCTION, HX OF 12/09/2006  . CAD (coronary artery disease) 12/09/2006   Past Medical History:  Diagnosis Date  . Atrial fibrillation ([place])   . Bradycardia   . CAD (coronary artery disease)   . Glaucoma   . HLD (hyperlipidemia)   . HTN (hypertension)   . Tachy-brady syndrome [facility])    Past Surgical History:  Procedure Laterality Date  .  CARDIAC SURGERY    . CARDIOVERSION N/A 02/06/2013   Procedure: CARDIOVERSION;  Surgeon: Jolaine Artist, MD;  Location: [facility] ENDOSCOPY;  Service: Cardiovascular;  Laterality: N/A;  . CORONARY ARTERY BYPASS GRAFT    . TONSILLECTOMY AND ADENOIDECTOMY     No Known Allergies Prior to Admission medications   Medication Sig Start Date End Date Taking? Authorizing Provider  apixaban (ELIQUIS) 5 MG TABS tablet Take 1 tablet (5 mg total) by mouth 2 (two) times daily. 04/15/14  Yes Bensimhon, Shaune Pascal, MD  aspirin 81 MG tablet Take 81 mg by mouth daily.     Yes [provider]  atorvastatin (LIPITOR) 40 MG tablet TAKE 1 BY MOUTH DAILY CONTACT YOUR PROVIDER FOR AN APPOINTMENT 05/20/15  Yes Bensimhon, Shaune Pascal, MD  B Complex-C (SUPER B COMPLEX PO) Take by mouth.   Yes [provider]  Calcium Carbonate (CALCIUM 600) 1500 MG TABS Take 1 tablet by mouth daily.    Yes [provider]  hydrochlorothiazide (HYDRODIURIL) 25 MG tablet TAKE ONE TABLET BY MOUTH ONCE DAILY 10/14/15  Yes Bensimhon, Shaune Pascal, MD  metoprolol tartrate (LOPRESSOR) 25 MG tablet Take 1 tablet (25 mg total) by mouth as needed. Only uses if needed for tachycardia. Has not needed in over a year. 04/08/15  Yes Bensimhon, Shaune Pascal, MD  Multiple Vitamin (MULTIVITAMIN) tablet Take 1 tablet by mouth daily.    Yes [provider]  nitroGLYCERIN (NITROSTAT) 0.4 MG SL tablet  Place 0.4 mg under the tongue every 5 (five) minutes as needed for chest pain.    Yes [provider]  timolol (BETIMOL) 0.5 % ophthalmic solution 1 drop 2 (two) times daily.   Yes [provider]  famotidine (PEPCID) 20 MG tablet Take 20 mg by mouth 2 (two) times daily. Reported on 04/01/2015    [provider]   Social History   Social History  . Marital status: Married    Spouse name: N/A  . Number of children: 2  . Years of education: N/A   Occupational History  . accountant    Social History Main Topics  .  Smoking status: Former Smoker    Quit date: 01/12/1967  . Smokeless tobacco: Never Used  . Alcohol use 16.8 oz/week    28 Standard drinks or equivalent per week     Comment: 1 bottle of wine daily  . Drug use: No  . Sexual activity: Yes   Other Topics Concern  . Not on file   Social History Narrative   HSG, [place]. 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 Has weathered the recession OK with some adjustments. Advanced Care planning - patient does not want cardiac resuscitation - provided a signed "out of facility" order for home display (Aug '12) and did review the MOST form, providing an unsigned copy to be used for home discussion with report back.             Review of Systems  Respiratory: Negative for shortness of breath.   Genitourinary: Negative for hematuria.  Musculoskeletal: Positive for back pain.  Neurological: Negative for weakness.      Objective:   Physical Exam  Cardiovascular: An irregularly irregular rhythm present.  But overall controlled rate.  Pulmonary/Chest:  Lung sounds equal bilaterally  Abdominal: Bowel sounds are normal. There is tenderness (minimal) in the right upper quadrant. There is negative Murphy's sign.  Musculoskeletal:  [place] clavicle and AC non-tender. Upper arm non-tender. FROM at R shoulder. Bandages at R posterior upper arm. 2 small skin tears hemostatic. No midline bony tender on thoracic or lumbar spine. Some bruising and small abrasion on R lower posterior chest wall. Diffuse tenderness along lower ribs. Lumbar paraspinals are non-tender. Elbow non-tender, FROM.   Vitals:   07/19/16 1058  BP: (!) 150/81  Pulse: 82  Resp: 18  SpO2: 99%  Weight: 155 lb 6.4 oz (70.5 kg)  Height: 5\' 11"  (1.803 m)   Results for orders placed or performed in visit on 07/19/16  POCT urinalysis dipstick    Result Value Ref Range   Color, UA yellow yellow   Clarity, UA clear clear   Glucose, UA negative negative mg/dL   Bilirubin, UA negative negative   Ketones, POC UA negative negative mg/dL   Spec Grav, UA <=1.005 (A) 1.010 - 1.025   Blood, UA trace-intact (A) negative   pH, UA 5.0 5.0 - 8.0   Protein Ur, POC negative negative mg/dL   Urobilinogen, UA 0.2 0.2 or 1.0 E.U./dL   Nitrite, UA Negative Negative   Leukocytes, UA Negative Negative   Dg Ribs Unilateral W/chest Right  Result Date: 07/19/2016 CLINICAL DATA:  Right posterior chest wall pain after fall last night. EXAM: RIGHT RIBS AND CHEST - 3+ VIEW COMPARISON:  Chest x-ray 04/01/2015 FINDINGS: The lungs are clear without focal pneumonia, edema, pneumothorax  or pleural effusion. Hyperexpansion is stable. Scarring at the left base is unchanged. Oblique views of the right ribs show a fracture of the lateral right ninth rib. Fracture line is indistinct with some adjacent lucency. Oblique film suggests that there may be associated nondisplaced eighth rib fracture. IMPRESSION: Fracture of the right lateral ninth rib has poorly defined margins and a suggestion of bony erosion. This may be due to subacute injury. No soft tissue nodule or gross bony destruction to suggest pathologic fracture. There is probably an associated nondisplaced right eighth rib fracture. Consider follow-up films to ensure appropriate healing. Electronically Signed   By: Misty Stanley M.D.   On: 07/19/2016 12:42   US Abdomen Complete  Result Date: 07/19/2016 CLINICAL DATA:  Right eighth and ninth rib fractures after fall yesterday. Right upper quadrant abdomen pain. EXAM: ABDOMEN ULTRASOUND COMPLETE COMPARISON:  None. FINDINGS: Gallbladder: Gallstones are noted in the gallbladder. No wall thickening visualized. No sonographic Murphy sign noted by sonographer. Common bile duct: Diameter: 3.6 mm Liver: No focal lesion identified. Within normal limits in parenchymal  echogenicity. IVC: Echogenic material is identified in the proximal IVC. Pancreas: Visualized portion unremarkable. Spleen: Size and appearance within normal limits. Right Kidney: Length: 11.4 cm. Echogenicity within normal limits. No mass or hydronephrosis visualized. Left Kidney: Length: 9.8 cm. Echogenicity within normal limits. No mass or hydronephrosis visualized. Abdominal aorta: No aneurysm visualized. Other findings: Ascites is noted around the spleen. IMPRESSION: Echogenic material is identified in the proximal IVC. Thrombus within the IVC is not excluded. Suggest CT with contrast for further evaluation. Ascites around the spleen. Cholelithiasis without sonographic evidence of acute cholecystitis. These results will be called to the ordering clinician or representative by the Radiologist Assistant, and communication documented in the PACS or zVision Dashboard. Electronically Signed   By: Abelardo Diesel M.D.   On: 07/19/2016 16:24   Ct Angio Abd/pel W/ And/or W/o  Addendum Date: 07/19/2016   ADDENDUM REPORT: 07/19/2016 20:40 ADDENDUM: This patient was taken by the CT technologist to the emergency department at approximately 2010 hours on 07/19/2016. Electronically Signed   By: Misty Stanley M.D.   On: 07/19/2016 20:40   Result Date: 07/19/2016 CLINICAL DATA:  Question of IVC thrombus on ultrasound exam earlier today. EXAM: CTA ABDOMEN AND PELVIS wITHOUT AND WITH CONTRAST TECHNIQUE: Multidetector CT imaging of the abdomen and pelvis was performed using the standard protocol during bolus administration of intravenous contrast. Multiplanar reconstructed images and MIPs were obtained and reviewed to evaluate the vascular anatomy. CONTRAST:  100 cc Isovue 370 COMPARISON:  Ultrasound exam 07/19/2016 FINDINGS: Lower chest:  Coronary artery calcification evident. Hepatobiliary: No focal abnormality within the liver parenchyma. Gallbladder is distended with a tiny dependent stone evident. No intrahepatic or  extrahepatic biliary dilation. Pancreas: No focal mass lesion. No dilatation of the main duct. No intraparenchymal cyst. No peripancreatic edema. Spleen: Small amount of fluid around the dome of the spleen may be subcapsular and is compatible with hemorrhage. Adrenals/Urinary Tract: No adrenal nodule or mass. Kidneys are unremarkable. No evidence for hydroureter. The urinary bladder appears normal for the degree of distention. Stomach/Bowel: Stomach is nondistended. No gastric wall thickening. No evidence of outlet obstruction. Duodenum is normally positioned as is the ligament of Treitz. No small bowel wall thickening. No small bowel dilatation. The terminal ileum is normal. The appendix is normal. Diverticular changes are noted in the left colon without evidence of diverticulitis. Vascular/Lymphatic: There is abdominal aortic atherosclerosis without aneurysm. Although no clot is evident  within the lumen of the IVC near the liver as suggested on the ultrasound exam earlier today, the patient does have adherent thrombus in the IVC below the right renal vein (see image 38 series 6 and coronal image 56 of series 11). This nonocclusive thrombus extends about along the right lateral wall of the IVC extending 3.5 cm proximal to the right renal vein and measuring about 6 mm in thickness. Portal vein and superior mesenteric vein are patent. Splenic vein is patent. Dense calcific plaque is seen at the origin of the SMA although it does opacify. Reproductive: Prostate gland is enlarged. Other: No intraperitoneal free fluid. Musculoskeletal: Right groin hernia contains only fat. Fractures of the lateral right eighth and ninth ribs are identified (images 59 and 74 of series 4). IMPRESSION: 1. Fluid around the upper spleen compatible with hemorrhage. Given the well-defined margins, this may be subcapsular potential small laceration along the medial aspect of the splenic dome. 2. Adherent thrombus along the right lateral wall of  the IVC proximal to (caudal) the right renal vein confluence. This thrombus is nonobstructing and appears to be adherent to the wall of the IVC. 3. Nondisplaced fractures of the right lateral eighth and ninth ribs. 4. No free intraperitoneal fluid or evidence for pneumoperitoneum. 5. Right groin hernia contains only fat. Electronically Signed: By: Misty Stanley M.D. On: 07/19/2016 19:50      Assessment & Plan:    Larry Lopez is a 83 y.o. male Fall in home, initial encounter  Chest wall pain - Plan: DG Ribs Unilateral W/Chest Right  Skin tear of right elbow without complication, initial encounter  Flank pain - Plan: POCT urinalysis dipstick, US Abdomen Complete  Other microscopic hematuria - Plan: US Abdomen Complete, POCT CBC, BUN+Creat  RUQ abdominal pain - Plan: US Abdomen Complete, POCT CBC, CANCELED: CT ABDOMEN W CONTRAST  Closed fracture of multiple ribs of right side, initial encounter  Abnormal abdominal ultrasound - Plan: CANCELED: CT ABDOMEN W CONTRAST  Other ascites - Plan: CANCELED: CT ABDOMEN W CONTRAST  Status post fall 1 day prior with right sided back pain. Multiple rib fractures noted on x-ray, but with abdominal pain/right upper quadrant abdominal pain, ultrasound was obtained. Prescription for hydrocodone initially prescribed for flank pain. Skin tear noted, but minor and symptomatic care discussed. Ultrasound indicated ascites/fluid around spleen and questionable area of IVC. Subsequently sent for CT abdomen and based on results was sent over to emergency room for evaluation as indicated possible splenic laceration and IVC thrombus.    Meds ordered this encounter  Medications  . HYDROcodone-acetaminophen (NORCO/VICODIN) 5-325 MG tablet    Sig: Take 0.5-1 tablets by mouth every 6 (six) hours as needed for moderate pain.    Dispense:  10 tablet    Refill:  0   Patient Instructions    Hydrocodone if needed for pain - use lowest effective dose. Do not combine  with other sedating medicines or alcohol and be careful with falls with this medicine as well.   We will check ultrasound today to look at liver/kidneys. Recheck in 4-5 days, sooner if any new symptoms such as shortness of breath or other worsening.    Rib Fracture A rib fracture is a break or crack in one of the bones of the ribs. The ribs are a group of long, curved bones that wrap around your chest and attach to your spine. They protect your lungs and other organs in the chest cavity. A broken or cracked rib  is often painful, but most do not cause other problems. Most rib fractures heal on their own over time. However, rib fractures can be more serious if multiple ribs are broken or if broken ribs move out of place and push against other structures. What are the causes?  A direct blow to the chest. For example, this could happen during contact sports, a car accident, or a fall against a hard object.  Repetitive movements with high force, such as pitching a baseball or having severe coughing spells. What are the signs or symptoms?  Pain when you breathe in or cough.  Pain when someone presses on the injured area. How is this diagnosed? Your caregiver will perform a physical exam. Various imaging tests may be ordered to confirm the diagnosis and to look for related injuries. These tests may include a chest X-ray, computed tomography (CT), magnetic resonance imaging (MRI), or a bone scan. How is this treated? Rib fractures usually heal on their own in 1-3 months. The longer healing period is often associated with a continued cough or other aggravating activities. During the healing period, pain control is very important. Medication is usually given to control pain. Hospitalization or surgery may be needed for more severe injuries, such as those in which multiple ribs are broken or the ribs have moved out of place. Follow these instructions at home:  Avoid strenuous activity and any activities  or movements that cause pain. Be careful during activities and avoid bumping the injured rib.  Gradually increase activity as directed by your caregiver.  Only take over-the-counter or prescription medications as directed by your caregiver. Do not take other medications without asking your caregiver first.  Apply ice to the injured area for the first 1-2 days after you have been treated or as directed by your caregiver. Applying ice helps to reduce inflammation and pain. ? Put ice in a plastic bag. ? Place a towel between your skin and the bag. ? Leave the ice on for 15-20 minutes at a time, every 2 hours while you are awake.  Perform deep breathing as directed by your caregiver. This will help prevent pneumonia, which is a common complication of a broken rib. Your caregiver may instruct you to: ? Take deep breaths several times a day. ? Try to cough several times a day, holding a pillow against the injured area. ? Use a device called an incentive spirometer to practice deep breathing several times a day.  Drink enough fluids to keep your urine clear or pale yellow. This will help you avoid constipation.  Do not wear a rib belt or binder. These restrict breathing, which can lead to pneumonia. Get help right away if:  You have a fever.  You have difficulty breathing or shortness of breath.  You develop a continual cough, or you cough up thick or bloody sputum.  You feel sick to your stomach (nausea), throw up (vomit), or have abdominal pain.  You have worsening pain not controlled with medications. This information is not intended to replace advice given to you by your health care provider. Make sure you discuss any questions you have with your health care provider. Document Released: 12/28/2004 Document Revised: 06/05/2015 Document Reviewed: 03/01/2012 Elsevier Interactive Patient Education  2018 [employer].  Skin Tear Care A skin tear is a wound in which the top layer of skin  peels off. To repair the skin, your doctor may use:  Tape.  Skin adhesive strips.  A bandage (dressing) may also  be placed over the tape or skin adhesive strips. How to care for your skin tear  Clean the wound as told by your doctor. You may be told to keep the wound dry for the first few days. If you are told to clean the wound: ? Wash the wound with mild soap and water or a salt-water (saline) solution. ? Rinse the wound with water to remove all soap. ? Do not rub the wound dry. Let the wound air dry.  Change any dressings as told by your doctor. This includes changing the dressing if it gets wet, gets dirty, or starts to smell bad.  Do not scratch or pick at the wound.  Protect the injured area until it has healed.  Check your wound every day for signs of infection. Check for: ? More redness, swelling, or pain. ? More fluid or blood. ? Warmth. ? Pus or a bad smell. Medicines   Take over-the-counter and prescription medicines only as told by your doctor.  If you were prescribed an antibiotic medicine, take or apply it as told by your doctor. Do not stop using the antibiotic even if your condition gets better. General instructions  Keep the bandage dry as told by your doctor.  Do not take baths, swim, or do anything that puts your wound underwater until your doctor says it is okay.  Keep all follow-up visits as told by your doctor. This is important. Contact a doctor if:  You have more redness, swelling, or pain around your wound.  You have more fluid or blood coming from your wound.  Your wound feels warm to the touch.  You have pus or a bad smell coming from your wound. Get help right away if:  You have a red streak that goes away from the skin tear.  You have a fever and chills and your symptoms suddenly get worse. This information is not intended to replace advice given to you by your health care provider. Make sure you discuss any questions you have with your  health care provider. Document Released: 10/07/2007 Document Revised: 08/24/2015 Document Reviewed: 11/18/2014 Elsevier Interactive Patient Education  2018 [employer].    IF you received an x-ray today, you will receive an invoice from [facility]. Please contact [facility] at [phone] with questions or concerns regarding your invoice.   IF you received labwork today, you will receive an invoice from [place]. Please contact [facility] at [phone] with questions or concerns regarding your invoice.   Our billing staff will not be able to assist you with questions regarding bills from these companies.  You will be contacted with the lab results as soon as they are available. The fastest way to get your results is to activate your My Chart account. Instructions are located on the last page of this paperwork. If you have not heard from [name] regarding the results in 2 weeks, please contact this office.      I personally performed the services described in this documentation, which was scribed in my presence. The recorded information has been reviewed and considered for accuracy and completeness, addended by me as needed, and agree with information above.  Signed,   Merri Ray, MD [facility] at [place].  07/20/16 8:08 AM

## 2016-07-19 NOTE — ED Triage Notes (Signed)
Pt had a fall yesterday at home, he was seen by his pcp and order some xrays and CT, results shows that he is having some ribs fracture and spleen lac. Pt is c/o 9/10 right flank and rib cage pain at this time mostly when he moves.

## 2016-07-20 DIAGNOSIS — I2510 Atherosclerotic heart disease of native coronary artery without angina pectoris: Secondary | ICD-10-CM | POA: Diagnosis present

## 2016-07-20 DIAGNOSIS — S36039A Unspecified laceration of spleen, initial encounter: Secondary | ICD-10-CM | POA: Diagnosis present

## 2016-07-20 DIAGNOSIS — Z66 Do not resuscitate: Secondary | ICD-10-CM | POA: Diagnosis present

## 2016-07-20 DIAGNOSIS — W01190A Fall on same level from slipping, tripping and stumbling with subsequent striking against furniture, initial encounter: Secondary | ICD-10-CM | POA: Diagnosis present

## 2016-07-20 DIAGNOSIS — Y92009 Unspecified place in unspecified non-institutional (private) residence as the place of occurrence of the external cause: Secondary | ICD-10-CM | POA: Diagnosis not present

## 2016-07-20 DIAGNOSIS — Z79899 Other long term (current) drug therapy: Secondary | ICD-10-CM | POA: Diagnosis not present

## 2016-07-20 DIAGNOSIS — Z951 Presence of aortocoronary bypass graft: Secondary | ICD-10-CM | POA: Diagnosis not present

## 2016-07-20 DIAGNOSIS — Z7901 Long term (current) use of anticoagulants: Secondary | ICD-10-CM | POA: Diagnosis not present

## 2016-07-20 DIAGNOSIS — E785 Hyperlipidemia, unspecified: Secondary | ICD-10-CM | POA: Diagnosis present

## 2016-07-20 DIAGNOSIS — I482 Chronic atrial fibrillation: Secondary | ICD-10-CM | POA: Diagnosis not present

## 2016-07-20 DIAGNOSIS — Z7982 Long term (current) use of aspirin: Secondary | ICD-10-CM | POA: Diagnosis not present

## 2016-07-20 DIAGNOSIS — S36030A Superficial (capsular) laceration of spleen, initial encounter: Secondary | ICD-10-CM | POA: Diagnosis not present

## 2016-07-20 DIAGNOSIS — S2242XA Multiple fractures of ribs, left side, initial encounter for closed fracture: Secondary | ICD-10-CM | POA: Diagnosis present

## 2016-07-20 DIAGNOSIS — I10 Essential (primary) hypertension: Secondary | ICD-10-CM | POA: Diagnosis present

## 2016-07-20 DIAGNOSIS — W19XXXA Unspecified fall, initial encounter: Secondary | ICD-10-CM

## 2016-07-20 DIAGNOSIS — S2231XA Fracture of one rib, right side, initial encounter for closed fracture: Secondary | ICD-10-CM | POA: Diagnosis not present

## 2016-07-20 DIAGNOSIS — S36029A Unspecified contusion of spleen, initial encounter: Secondary | ICD-10-CM | POA: Diagnosis present

## 2016-07-20 DIAGNOSIS — I82220 Acute embolism and thrombosis of inferior vena cava: Secondary | ICD-10-CM | POA: Diagnosis present

## 2016-07-20 DIAGNOSIS — H409 Unspecified glaucoma: Secondary | ICD-10-CM | POA: Diagnosis present

## 2016-07-20 LAB — CBC
HEMATOCRIT: 43.9 % (ref 39.0–52.0)
HEMATOCRIT: 41.4 % (ref 39.0–52.0)
HEMATOCRIT: 40.8 % (ref 39.0–52.0)
HEMATOCRIT: 41.8 % (ref 39.0–52.0)
HEMOGLOBIN: 14.5 g/dL (ref 13.0–17.0)
HEMOGLOBIN: 14 g/dL (ref 13.0–17.0)
HEMOGLOBIN: 13.9 g/dL (ref 13.0–17.0)
Hemoglobin: 13.5 g/dL (ref 13.0–17.0)
MCH: 30.9 pg (ref 26.0–34.0)
MCH: 31.9 pg (ref 26.0–34.0)
MCH: 30.8 pg (ref 26.0–34.0)
MCH: 31.4 pg (ref 26.0–34.0)
MCHC: 33 g/dL (ref 30.0–36.0)
MCHC: 33.8 g/dL (ref 30.0–36.0)
MCHC: 33.1 g/dL (ref 30.0–36.0)
MCHC: 33.3 g/dL (ref 30.0–36.0)
MCV: 93.6 fL (ref 78.0–100.0)
MCV: 94.3 fL (ref 78.0–100.0)
MCV: 93.2 fL (ref 78.0–100.0)
MCV: 94.4 fL (ref 78.0–100.0)
Platelets: 175 10*3/uL (ref 150–400)
Platelets: 154 10*3/uL (ref 150–400)
Platelets: 165 10*3/uL (ref 150–400)
Platelets: 174 10*3/uL (ref 150–400)
RBC: 4.69 MIL/uL (ref 4.22–5.81)
RBC: 4.39 MIL/uL (ref 4.22–5.81)
RBC: 4.38 MIL/uL (ref 4.22–5.81)
RBC: 4.43 MIL/uL (ref 4.22–5.81)
RDW: 15.1 % (ref 11.5–15.5)
RDW: 15.2 % (ref 11.5–15.5)
RDW: 14.9 % (ref 11.5–15.5)
RDW: 15 % (ref 11.5–15.5)
WBC: 6.6 10*3/uL (ref 4.0–10.5)
WBC: 7.5 10*3/uL (ref 4.0–10.5)
WBC: 6.9 10*3/uL (ref 4.0–10.5)
WBC: 7.1 10*3/uL (ref 4.0–10.5)

## 2016-07-20 LAB — BASIC METABOLIC PANEL
Anion gap: 8 (ref 5–15)
BUN: 9 mg/dL (ref 6–20)
CHLORIDE: 104 mmol/L (ref 101–111)
CO2: 27 mmol/L (ref 22–32)
CREATININE: 0.83 mg/dL (ref 0.61–1.24)
Calcium: 9 mg/dL (ref 8.9–10.3)
GFR calc Af Amer: >60 mL/min (ref >60)
GFR calc non Af Amer: >60 mL/min (ref >60)
Glucose, Bld: 97 mg/dL (ref 65–99)
Potassium: 3.6 mmol/L (ref 3.5–5.1)
SODIUM: 139 mmol/L (ref 135–145)

## 2016-07-20 LAB — MRSA PCR SCREENING: MRSA by PCR: NEGATIVE

## 2016-07-20 MED ORDER — ACETAMINOPHEN 325 MG PO TABS: 650.0000 mg | ORAL_TABLET | ORAL | Status: DC | PRN

## 2016-07-20 MED ORDER — SODIUM CHLORIDE 0.9 % IV SOLN
INTRAVENOUS | Status: DC
  Administered 2016-07-20: 04:00:00 via INTRAVENOUS

## 2016-07-20 MED ORDER — TIMOLOL HEMIHYDRATE 0.5 % OP SOLN: 1.0000 [drp] | Freq: Two times a day (BID) | OPHTHALMIC | Status: DC

## 2016-07-20 MED ORDER — ONDANSETRON 4 MG PO TBDP
4.0000 mg | ORAL_TABLET | Freq: Four times a day (QID) | ORAL | Status: DC | PRN
  Filled 2016-07-20: qty 1

## 2016-07-20 MED ORDER — METOPROLOL TARTRATE 25 MG PO TABS
25.0000 mg | ORAL_TABLET | Freq: Every day | ORAL | Status: DC | PRN
  Administered 2016-07-21: 25 mg via ORAL
  Filled 2016-07-20: qty 1

## 2016-07-20 MED ORDER — HYDROCODONE-ACETAMINOPHEN 5-325 MG PO TABS: 2.0000 | ORAL_TABLET | ORAL | Status: DC | PRN

## 2016-07-20 MED ORDER — ATORVASTATIN CALCIUM 40 MG PO TABS
40.0000 mg | ORAL_TABLET | Freq: Every day | ORAL | Status: DC
  Administered 2016-07-20: 40 mg via ORAL
  Filled 2016-07-20: qty 1

## 2016-07-20 MED ORDER — CALCIUM CARBONATE 1500 (600 CA) MG PO TABS: 1.0000 | ORAL_TABLET | Freq: Every day | ORAL | Status: DC

## 2016-07-20 MED ORDER — MORPHINE SULFATE (PF) 4 MG/ML IV SOLN: 2.0000 mg | INTRAVENOUS | Status: DC | PRN

## 2016-07-20 MED ORDER — CALCIUM CARBONATE 1250 (500 CA) MG PO TABS
3.0000 | ORAL_TABLET | Freq: Every day | ORAL | Status: DC
  Filled 2016-07-20: qty 2

## 2016-07-20 MED ORDER — ONDANSETRON HCL 4 MG/2ML IJ SOLN
4.0000 mg | Freq: Four times a day (QID) | INTRAMUSCULAR | Status: DC | PRN
  Administered 2016-07-20: 4 mg via INTRAVENOUS
  Filled 2016-07-20: qty 2

## 2016-07-20 MED ORDER — MORPHINE SULFATE (PF) 4 MG/ML IV SOLN
2.0000 mg | INTRAVENOUS | Status: DC | PRN
  Administered 2016-07-20: 2 mg via INTRAVENOUS
  Filled 2016-07-20: qty 1

## 2016-07-20 MED ORDER — ASPIRIN 81 MG PO CHEW
81.0000 mg | CHEWABLE_TABLET | Freq: Every day | ORAL | Status: DC
  Administered 2016-07-20: 81 mg via ORAL
  Filled 2016-07-20: qty 1

## 2016-07-20 MED ORDER — MORPHINE SULFATE (PF) 2 MG/ML IV SOLN: 1.0000 mg | INTRAVENOUS | Status: DC | PRN

## 2016-07-20 MED ORDER — TIMOLOL MALEATE 0.5 % OP SOLN
1.0000 [drp] | Freq: Two times a day (BID) | OPHTHALMIC | Status: DC
  Administered 2016-07-20 – 2016-07-21 (×2): 1 [drp] via OPHTHALMIC
  Filled 2016-07-20: qty 5

## 2016-07-20 MED ORDER — ASPIRIN 81 MG PO TABS: 81.0000 mg | ORAL_TABLET | Freq: Every day | ORAL | Status: DC

## 2016-07-20 MED ORDER — NITROGLYCERIN 0.4 MG SL SUBL: 0.4000 mg | SUBLINGUAL_TABLET | SUBLINGUAL | Status: DC | PRN

## 2016-07-20 MED ORDER — HYDROCODONE-ACETAMINOPHEN 5-325 MG PO TABS
1.0000 | ORAL_TABLET | ORAL | Status: DC | PRN
  Administered 2016-07-20 – 2016-07-21 (×5): 1 via ORAL
  Filled 2016-07-20 (×5): qty 1

## 2016-07-20 NOTE — Progress Notes (Signed)
Patient looks great although he has a lot of chest wall pain.[name]  No IS at bedside.  Thrombus noted on CT is adherent to the lateral wall and I do not believe is at risk for embolization.  He can be off of his Eliquis for one week and restart per Dr. Jeffie Pollock.  Splenic lacerations is Grade I at most and little risk for bleeding or expansion.  Kathryne Eriksson. Dahlia Bailiff, MD, [place] [phone] Trauma Surgeon

## 2016-07-20 NOTE — Care Management Note (Signed)
Case Management Note  Patient Details  Name: Larry Lopez MRN: [id number] Date of Birth: September 22, 1926  Subjective/Objective:  Pt is a 83yo male with R rib cage pain s/p mechanical fall 07/19/16, dx with splenic hematoma and right rib fxs.  PTA, pt resided at home with spouse.              Action/Plan: PT recommending no OP follow up.  Family able to provide 24h care at discharge.  Will follow progress.  Expected Discharge Date:  07/23/16               Expected Discharge Plan:  Home/Self Care  In-House Referral:  Clinical Social Work  Discharge planning Services  CM Consult  Post Acute Care Choice:    Choice offered to:     DME Arranged:    DME Agency:     HH Arranged:    HH Agency:     Status of Service:  In process, will continue to follow  If discussed at Long Length of Stay Meetings, dates discussed:    Additional Comments:  Reinaldo Raddle, RN, BSN  Trauma/Neuro ICU Case Manager [phone]

## 2016-07-20 NOTE — Evaluation (Signed)
Physical Therapy Evaluation Patient Details Name: Larry Lopez MRN: [id number] DOB: 13-Oct-1926 Today's Date: 07/20/2016   History of Present Illness  Pt is a 83yo male with R rib cage pain s/p mechanical fall 07/19/16, dx with splenic hematoma and right rib fxs. PMH of CAD, a-fib, HTN, hyperlipidemia and balance issues in the past with alcohol use.   Clinical Impression  Patient evaluated by Physical Therapy with no further acute PT needs identified. All education has been completed and the patient has no further questions. Pt is mod I for bed mobility, and transfer and supervision for ambulation of 600 feet.  See below for any follow-up Physical Therapy or equipment needs. PT is signing off. Thank you for this referral.     Follow Up Recommendations No PT follow up;Supervision for mobility/OOB    Equipment Recommendations  None recommended by PT    Recommendations for Other Services       Precautions / Restrictions Precautions Precautions: Fall Restrictions Weight Bearing Restrictions: No      Mobility  Bed Mobility Overal bed mobility: Modified Independent             General bed mobility comments: use of bed rails to pull to upright  Transfers Overall transfer level: Modified independent               General transfer comment: good powerup and steadying in upright   Ambulation/Gait Ambulation/Gait assistance: Supervision Ambulation Distance (Feet): 600 Feet Assistive device: None Gait Pattern/deviations: Step-through pattern;Shuffle Gait velocity: age appropriate Gait velocity interpretation: >2.62 ft/sec, indicative of independent community ambulator General Gait Details: supervision for safety, slight shuffling gait while talking and walking, when pt concentrates on ambulation able to pick up feet with no problems         Balance Overall balance assessment: No apparent balance deficits (not formally assessed) (Pt reports prior balance issues and his  daily PT exercises)                                           Pertinent Vitals/Pain Pain Assessment: 0-10 Pain Score: 5  Pain Location: R rib cage with movement Pain Descriptors / Indicators: Aching;Guarding;Grimacing;Tender Pain Intervention(s): Monitored during session;Patient requesting pain meds-RN notified  At rest HR SaO2 on RA 98%O2, HR 88bpm, with ambulation HR rose to 147 bpm, after ambulation BP 144/76, HR 93 bpm , SaO2 on RA 98%O2    Home Living Family/patient expects to be discharged to:: Private residence Living Arrangements: Spouse/significant other Available Help at Discharge: Family;Available 24 hours/day Type of Home: House Home Access: Stairs to enter Entrance Stairs-Rails: None Entrance Stairs-Number of Steps: 2 Home Layout: One level Home Equipment: None      Prior Function Level of Independence: Independent (community ambulator and driver)               Journalist, newspaper        Extremity/Trunk Assessment   Upper Extremity Assessment Upper Extremity Assessment: Overall WFL for tasks assessed    Lower Extremity Assessment Lower Extremity Assessment: RLE deficits/detail;LLE deficits/detail RLE Deficits / Details: ROM WFL, strength grossly 4/5 RLE Sensation: history of peripheral neuropathy LLE Deficits / Details: ROM WFL, strength grossly 4/5    Cervical / Trunk Assessment Cervical / Trunk Assessment: Normal  Communication   Communication: HOH  Cognition Arousal/Alertness: Awake/alert Behavior During Therapy: WFL for tasks assessed/performed Overall Cognitive Status: Within Functional  Limits for tasks assessed                                        General Comments General comments (skin integrity, edema, etc.): Physician educated pt not to do his daily weightlifting exercises and gym exercises. PT encouraged continued balance training as well as a mindful walking program where he focuses on training to lift  his feet with gait,.     Assessment/Plan    PT Assessment Patent does not need any further PT services         PT Goals (Current goals can be found in the Care Plan section)  Acute Rehab PT Goals Patient Stated Goal: go home PT Goal Formulation: With patient/family     AM-PAC PT "6 Clicks" Daily Activity  Outcome Measure Difficulty turning over in bed (including adjusting bedclothes, sheets and blankets)?: A Lot Difficulty moving from lying on back to sitting on the side of the bed? : A Little Difficulty sitting down on and standing up from a chair with arms (e.g., wheelchair, bedside commode, etc,.)?: A Little Help needed moving to and from a bed to chair (including a wheelchair)?: None Help needed walking in hospital room?: None Help needed climbing 3-5 steps with a railing? : None 6 Click Score: 20    End of Session Equipment Utilized During Treatment: Gait belt Activity Tolerance: Patient tolerated treatment well Patient left: in chair;with call bell/phone within reach;with family/visitor present Nurse Communication: Mobility status;Patient requests pain meds PT Visit Diagnosis: History of falling (Z91.81);Pain Pain - Right/Left: Right Pain - part of body:  (rib cage)    Time: 9147-8295 PT Time Calculation (min) (ACUTE ONLY): 34 min   Charges:   PT Evaluation $PT Eval Moderate Complexity: 1 Procedure PT Treatments $Gait Training: 8-22 mins   PT G Codes:        Kalla Watson B. Migdalia Dk PT, DPT Acute Rehabilitation  [phone] Pager [phone]   [place] 07/20/2016, 3:54 PM

## 2016-07-20 NOTE — Progress Notes (Signed)
Copied pt's advanced directive. Pt requests to be a DNR while in the hospital. Trauma PA made aware.   Fritz Pickerel, RN

## 2016-07-20 NOTE — H&P (Signed)
Larry Lopez is an 83 y.o. male.   Chief Complaint: s/p fall with right chest pain HPI: 36 yom with cardiac history on eliquis presents after fall yesterday at 1700.  Has had right sided rib pain since then.  Took eliquis in am  Today.  No issues breathing now.  He drinks half bottle of wine per day.  He is here with his wife.   Past Medical History:  Diagnosis Date  . Atrial fibrillation ([place])   . Bradycardia   . CAD (coronary artery disease)   . Glaucoma   . HLD (hyperlipidemia)   . HTN (hypertension)   . Tachy-brady syndrome [facility])     Past Surgical History:  Procedure Laterality Date  . CARDIAC SURGERY    . CARDIOVERSION N/A 02/06/2013   Procedure: CARDIOVERSION;  Surgeon: Jolaine Artist, MD;  Location: [facility] ENDOSCOPY;  Service: Cardiovascular;  Laterality: N/A;  . CORONARY ARTERY BYPASS GRAFT    . TONSILLECTOMY AND ADENOIDECTOMY      Family History  Problem Relation Age of Onset  . Cancer Father        Deceased, 69  . Breast cancer Mother        Deceased, 75  . Breast cancer Sister   . Colon cancer Daughter        Living  . Diabetes Neg Hx   . Coronary artery disease Neg Hx    Social History:  reports that he quit smoking about 49 years ago. He has never used smokeless tobacco. He reports that he drinks about 16.8 oz of alcohol per week . He reports that he does not use drugs.  Allergies: No Known Allergies  meds reviewed  Results for orders placed or performed during the hospital encounter of 07/19/16 (from the past 48 hour(s))  CBC with Differential     Status: None   Collection Time: 07/19/16  9:28 PM  Result Value Ref Range   WBC 8.8 4.0 - 10.5 K/uL   RBC 5.03 4.22 - 5.81 MIL/uL   Hemoglobin 15.7 13.0 - 17.0 g/dL   HCT 47.0 39.0 - 52.0 %   MCV 93.4 78.0 - 100.0 fL   MCH 31.2 26.0 - 34.0 pg   MCHC 33.4 30.0 - 36.0 g/dL   RDW 15.2 11.5 - 15.5 %   Platelets 180 150 - 400 K/uL   Neutrophils Relative % 69 %   Neutro Abs 6.0 1.7 - 7.7 K/uL   Lymphocytes  Relative 22 %   Lymphs Abs 1.9 0.7 - 4.0 K/uL   Monocytes Relative 9 %   Monocytes Absolute 0.8 0.1 - 1.0 K/uL   Eosinophils Relative 0 %   Eosinophils Absolute 0.0 0.0 - 0.7 K/uL   Basophils Relative 0 %   Basophils Absolute 0.0 0.0 - 0.1 K/uL  Basic metabolic panel     Status: Abnormal   Collection Time: 07/19/16  9:28 PM  Result Value Ref Range   Sodium 135 135 - 145 mmol/L   Potassium 4.1 3.5 - 5.1 mmol/L   Chloride 99 (L) 101 - 111 mmol/L   CO2 26 22 - 32 mmol/L   Glucose, Bld 113 (H) 65 - 99 mg/dL   BUN 10 6 - 20 mg/dL   Creatinine, Ser 0.94 0.61 - 1.24 mg/dL   Calcium 9.2 8.9 - 10.3 mg/dL   GFR calc non Af Amer >60 >60 mL/min   GFR calc Af Amer >60 >60 mL/min    Comment: (NOTE) The eGFR has been calculated  using the CKD EPI equation. This calculation has not been validated in all clinical situations. eGFR's persistently <60 mL/min signify possible Chronic Kidney Disease.    Anion gap 10 5 - 15  Protime-INR     Status: None   Collection Time: 07/19/16  9:28 PM  Result Value Ref Range   Prothrombin Time 14.6 11.4 - 15.2 seconds   INR 1.13   Type and screen [place]     Status: None   Collection Time: 07/19/16  9:28 PM  Result Value Ref Range   ABO/RH(D) B NEG    Antibody Screen NEG    Sample Expiration 07/22/2016   ABO/Rh     Status: None   Collection Time: 07/19/16  9:28 PM  Result Value Ref Range   ABO/RH(D) B NEG   Hemoglobin and hematocrit, blood     Status: None   Collection Time: 07/19/16 10:12 PM  Result Value Ref Range   Hemoglobin 14.7 13.0 - 17.0 g/dL   HCT 43.0 39.0 - 52.0 %   Dg Ribs Unilateral W/chest Right  Result Date: 07/19/2016 CLINICAL DATA:  Right posterior chest wall pain after fall last night. EXAM: RIGHT RIBS AND CHEST - 3+ VIEW COMPARISON:  Chest x-ray 04/01/2015 FINDINGS: The lungs are clear without focal pneumonia, edema, pneumothorax or pleural effusion. Hyperexpansion is stable. Scarring at the left base is  unchanged. Oblique views of the right ribs show a fracture of the lateral right ninth rib. Fracture line is indistinct with some adjacent lucency. Oblique film suggests that there may be associated nondisplaced eighth rib fracture. IMPRESSION: Fracture of the right lateral ninth rib has poorly defined margins and a suggestion of bony erosion. This may be due to subacute injury. No soft tissue nodule or gross bony destruction to suggest pathologic fracture. There is probably an associated nondisplaced right eighth rib fracture. Consider follow-up films to ensure appropriate healing. Electronically Signed   By: Misty Stanley M.D.   On: 07/19/2016 12:42   US Abdomen Complete  Result Date: 07/19/2016 CLINICAL DATA:  Right eighth and ninth rib fractures after fall yesterday. Right upper quadrant abdomen pain. EXAM: ABDOMEN ULTRASOUND COMPLETE COMPARISON:  None. FINDINGS: Gallbladder: Gallstones are noted in the gallbladder. No wall thickening visualized. No sonographic Murphy sign noted by sonographer. Common bile duct: Diameter: 3.6 mm Liver: No focal lesion identified. Within normal limits in parenchymal echogenicity. IVC: Echogenic material is identified in the proximal IVC. Pancreas: Visualized portion unremarkable. Spleen: Size and appearance within normal limits. Right Kidney: Length: 11.4 cm. Echogenicity within normal limits. No mass or hydronephrosis visualized. Left Kidney: Length: 9.8 cm. Echogenicity within normal limits. No mass or hydronephrosis visualized. Abdominal aorta: No aneurysm visualized. Other findings: Ascites is noted around the spleen. IMPRESSION: Echogenic material is identified in the proximal IVC. Thrombus within the IVC is not excluded. Suggest CT with contrast for further evaluation. Ascites around the spleen. Cholelithiasis without sonographic evidence of acute cholecystitis. These results will be called to the ordering clinician or representative by the Radiologist Assistant, and  communication documented in the PACS or zVision Dashboard. Electronically Signed   By: Abelardo Diesel M.D.   On: 07/19/2016 16:24   Ct Angio Abd/pel W/ And/or W/o  Addendum Date: 07/19/2016   ADDENDUM REPORT: 07/19/2016 20:40 ADDENDUM: This patient was taken by the CT technologist to the emergency department at approximately 2010 hours on 07/19/2016. Electronically Signed   By: Misty Stanley M.D.   On: 07/19/2016 20:40   Result Date:  07/19/2016 CLINICAL DATA:  Question of IVC thrombus on ultrasound exam earlier today. EXAM: CTA ABDOMEN AND PELVIS wITHOUT AND WITH CONTRAST TECHNIQUE: Multidetector CT imaging of the abdomen and pelvis was performed using the standard protocol during bolus administration of intravenous contrast. Multiplanar reconstructed images and MIPs were obtained and reviewed to evaluate the vascular anatomy. CONTRAST:  100 cc Isovue 370 COMPARISON:  Ultrasound exam 07/19/2016 FINDINGS: Lower chest:  Coronary artery calcification evident. Hepatobiliary: No focal abnormality within the liver parenchyma. Gallbladder is distended with a tiny dependent stone evident. No intrahepatic or extrahepatic biliary dilation. Pancreas: No focal mass lesion. No dilatation of the main duct. No intraparenchymal cyst. No peripancreatic edema. Spleen: Small amount of fluid around the dome of the spleen may be subcapsular and is compatible with hemorrhage. Adrenals/Urinary Tract: No adrenal nodule or mass. Kidneys are unremarkable. No evidence for hydroureter. The urinary bladder appears normal for the degree of distention. Stomach/Bowel: Stomach is nondistended. No gastric wall thickening. No evidence of outlet obstruction. Duodenum is normally positioned as is the ligament of Treitz. No small bowel wall thickening. No small bowel dilatation. The terminal ileum is normal. The appendix is normal. Diverticular changes are noted in the left colon without evidence of diverticulitis. Vascular/Lymphatic: There is  abdominal aortic atherosclerosis without aneurysm. Although no clot is evident within the lumen of the IVC near the liver as suggested on the ultrasound exam earlier today, the patient does have adherent thrombus in the IVC below the right renal vein (see image 38 series 6 and coronal image 56 of series 11). This nonocclusive thrombus extends about along the right lateral wall of the IVC extending 3.5 cm proximal to the right renal vein and measuring about 6 mm in thickness. Portal vein and superior mesenteric vein are patent. Splenic vein is patent. Dense calcific plaque is seen at the origin of the SMA although it does opacify. Reproductive: Prostate gland is enlarged. Other: No intraperitoneal free fluid. Musculoskeletal: Right groin hernia contains only fat. Fractures of the lateral right eighth and ninth ribs are identified (images 59 and 74 of series 4). IMPRESSION: 1. Fluid around the upper spleen compatible with hemorrhage. Given the well-defined margins, this may be subcapsular potential small laceration along the medial aspect of the splenic dome. 2. Adherent thrombus along the right lateral wall of the IVC proximal to (caudal) the right renal vein confluence. This thrombus is nonobstructing and appears to be adherent to the wall of the IVC. 3. Nondisplaced fractures of the right lateral eighth and ninth ribs. 4. No free intraperitoneal fluid or evidence for pneumoperitoneum. 5. Right groin hernia contains only fat. Electronically Signed: By: Misty Stanley M.D. On: 07/19/2016 19:50    Review of Systems  Constitutional: Negative for chills and fever.  Respiratory: Negative for cough.   Cardiovascular: Positive for chest pain (right lateral). Negative for leg swelling.  Gastrointestinal: Negative for abdominal pain, heartburn, nausea and vomiting.  Musculoskeletal: Negative for neck pain.  Neurological: Negative for loss of consciousness.    Blood pressure (!) 154/81, pulse 83, temperature 98.6  F (37 C), temperature source Oral, resp. rate 18, height '5\' 11"'  (1.803 m), weight 70.3 kg (155 lb), SpO2 100 %. Physical Exam  Vitals reviewed. Constitutional: He is oriented to person, place, and time. He appears well-developed and well-nourished.  HENT:  Head: Normocephalic and atraumatic.  Right Ear: External ear normal.  Left Ear: External ear normal.  Mouth/Throat: Oropharynx is clear and moist.  Eyes: EOM are normal. Pupils are  equal, round, and reactive to light.  Neck: Neck supple. No spinous process tenderness and no muscular tenderness present.  Cardiovascular: Normal rate, normal heart sounds and intact distal pulses.  An irregularly irregular rhythm present.  Respiratory: Effort normal and breath sounds normal. He has no wheezes. He has no rales. He exhibits tenderness (right lateral).  GI: Soft. There is no tenderness.  Musculoskeletal: Normal range of motion. He exhibits no edema or tenderness.  Lymphadenopathy:    He has no cervical adenopathy.  Neurological: He is alert and oriented to person, place, and time.  Skin: Skin is warm and dry. He is not diaphoretic.  Psychiatric: He has a normal mood and affect. His behavior is normal.     Assessment/Plan Fall  Splenic hematoma- this appears small without extrav, admit for obs and serial hct.  Will not give any blood products or kcentra due to ivc clot IVC clot- will need further eval, will need to be back on ac and may need change for this Scds, liquid diet overnight Pain control for rib fx Lyndzee Kliebert, MD 07/20/2016, 12:01 AM

## 2016-07-20 NOTE — Consult Note (Signed)
Advanced Heart Failure Team Consult Note   Consulting Physician: Hulen Skains Primary Cardiologist:  [place] Reason for Consultation: Management of anticoagulation in setting on splenic laceration  HPI:      Larry Lopez ('[name]") is an 83 y.o. male  with a history of a coronary artery disease status post redo bypass surgery in 2002 by Dr. Cyndia Bent.[name] He also has a history of hypertension and hyperlipidemia, chronic AF complicated by  tachy-brady syndrome with significant resting bradycardia prohibiting AV-Nodal blockade. We are asked to consult to assist with management of anti-coagulation in setting of splenic laceration.  Overall has been doing well. Yesterday was at pool with family and had a few glasses of wine and fell and hit his side. CT showed several rib fx with splenic laceration and non occlusive thrombus in IVC. Denies head trauma.   Denies syncope. Says he is usually stable on his feet at baseline.   Review of Systems: [y] = yes, [ ]  = no   General: Weight gain [ ] ; Weight loss [ ] ; Anorex. ia [ ] ; Fatigue [ ] ; Fever [ ] ; Chills [ ] ; Weakness [ ]   Cardiac: Chest pain/pressure [ ] ; Resting SOB [ ] ; Exertional SOB [ ] ; Orthopnea [ ] ; Pedal Edema [ ] ; Palpitations [ ] ; Syncope [ ] ; Presyncope [ ] ; Paroxysmal nocturnal dyspnea[ ]   Pulmonary: Cough [ ] ; Wheezing[ ] ; Hemoptysis[ ] ; Sputum [ ] ; Snoring [ ]   GI: Vomiting[ ] ; Dysphagia[ ] ; Melena[ ] ; Hematochezia [ ] ; Heartburn[ ] ; Abdominal pain [ ] ; Constipation [ ] ; Diarrhea [ ] ; BRBPR [ ]   GU: Hematuria[ ] ; Dysuria [ ] ; Nocturia[ ]   Vascular: Pain in legs with walking [ ] ; Pain in feet with lying flat [ ] ; Non-healing sores [ ] ; Stroke [ ] ; TIA [ ] ; Slurred speech [ ] ;  Neuro: Headaches[ ] ; Vertigo[ ] ; Seizures[ ] ; Paresthesias[ ] ;Blurred vision [ ] ; Diplopia [ ] ; Vision changes [ ]   Ortho/Skin: Arthritis [ y]; Joint pain Blue.Reese ]; Muscle pain Blue.Reese ]; Joint swelling [ ] ; Back Pain [ ] ; Rash [ ]   Psych: Depression[ ] ; Anxiety[ ]   Heme:  Bleeding problems [ ] ; Clotting disorders [ ] ; Anemia [ ]   Endocrine: Diabetes [ ] ; Thyroid dysfunction[ ]   Home Medications Prior to Admission medications   Medication Sig Start Date End Date Taking? Authorizing Provider  aspirin 81 MG tablet Take 81 mg by mouth at bedtime.    Yes [provider]  atorvastatin (LIPITOR) 40 MG tablet TAKE 1 BY MOUTH DAILY CONTACT YOUR PROVIDER FOR AN APPOINTMENT Patient taking differently: Take 40 mg by mouth at bedtime.  05/20/15  Yes Ilia Dimaano, Shaune Pascal, MD  B Complex-C (SUPER B COMPLEX PO) Take 1 tablet by mouth daily.    Yes [provider]  Calcium Carbonate (CALCIUM 600) 1500 MG TABS Take 1 tablet by mouth daily.    Yes [provider]  Ginger, Zingiber officinalis, (GINGER PO) See admin instructions. Chew a small portion of ginger root as needed for indigestion   Yes [provider]  hydrochlorothiazide (HYDRODIURIL) 25 MG tablet TAKE ONE TABLET BY MOUTH ONCE DAILY 10/14/15  Yes Barba Solt, Shaune Pascal, MD  metoprolol tartrate (LOPRESSOR) 25 MG tablet Take 1 tablet (25 mg total) by mouth as needed. Only uses if needed for tachycardia. Has not needed in over a year. Patient taking differently: Take 25 mg by mouth daily as needed. ONLY IF TACHYCARDIC 04/08/15  Yes Altariq Goodall, Shaune Pascal, MD  nitroGLYCERIN (NITROSTAT) 0.4 MG SL  tablet Place 0.4 mg under the tongue every 5 (five) minutes as needed for chest pain.    Yes [provider]  timolol (BETIMOL) 0.5 % ophthalmic solution Place 1 drop into both eyes 2 (two) times daily.    Yes [provider]  apixaban (ELIQUIS) 5 MG TABS tablet Take 1 tablet (5 mg total) by mouth 2 (two) times daily. 07/19/16   Strother Everitt, Shaune Pascal, MD  HYDROcodone-acetaminophen (NORCO/VICODIN) 5-325 MG tablet Take 0.5-1 tablets by mouth every 6 (six) hours as needed for moderate pain. 07/19/16   Wendie Agreste, MD    Past Medical History: Past Medical History:  Diagnosis Date  . Atrial  fibrillation ([place])   . Bradycardia   . CAD (coronary artery disease)   . Glaucoma   . HLD (hyperlipidemia)   . HTN (hypertension)   . Tachy-brady syndrome [facility])     Past Surgical History: Past Surgical History:  Procedure Laterality Date  . CARDIAC SURGERY    . CARDIOVERSION N/A 02/06/2013   Procedure: CARDIOVERSION;  Surgeon: Jolaine Artist, MD;  Location: [facility] ENDOSCOPY;  Service: Cardiovascular;  Laterality: N/A;  . CORONARY ARTERY BYPASS GRAFT    . TONSILLECTOMY AND ADENOIDECTOMY      Family History: Family History  Problem Relation Age of Onset  . Cancer Father        Deceased, 22  . Breast cancer Mother        Deceased, 27  . Breast cancer Sister   . Colon cancer Daughter        Living  . Diabetes Neg Hx   . Coronary artery disease Neg Hx     Social History: Social History   Social History  . Marital status: Married    Spouse name: N/A  . Number of children: 2  . Years of education: N/A   Occupational History  . accountant    Social History Main Topics  . Smoking status: Former Smoker    Quit date: 01/12/1967  . Smokeless tobacco: Never Used  . Alcohol use 16.8 oz/week    28 Standard drinks or equivalent per week     Comment: 1 bottle of wine daily  . Drug use: No  . Sexual activity: Yes   Other Topics Concern  . None   Social History Narrative   [employer], [employer]. 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 Has weathered the recession OK with some adjustments. Advanced Care planning - patient does not want cardiac resuscitation - provided a signed "out of facility" order for home display (Aug '12) and did review the MOST form, providing an unsigned copy to be used for home discussion with report back.              Allergies:  No Known Allergies  Objective:    Vital Signs:   Temp:  [97.9 F (36.6  C)-98.7 F (37.1 C)] 98.7 F (37.1 C) (07/10 1625) Pulse Rate:  [66-128] 82 (07/10 2015) Resp:  [14-32] 14 (07/10 2015) BP: (101-157)/(66-97) 105/67 (07/10 2015) SpO2:  [95 %-100 %] 95 % (07/10 2015) Weight:  [70.3 kg (155 lb)] 70.3 kg (155 lb) (07/09 2117)    Weight change: Filed Weights   07/19/16 2117  Weight: 70.3 kg (155 lb)    Intake/Output:   Intake/Output Summary (Last 24 hours) at 07/20/16 2043 Last data filed at 07/20/16 1330  Gross per 24 hour  Intake              585 ml  Output              400 ml  Net              185 ml      Physical Exam    General:  Frail elderly NAD HEENT: normal Neck: supple. JVP 6. Carotids 2+ bilat; no bruits. No lymphadenopathy or thyromegaly appreciated. Cor: PMI nondisplaced. Irregular rate & rhythm. No rubs, gallops or murmurs. Lungs: clear decreased at bases Abdomen: soft, tender over left upper quadrant, nondistended. No hepatosplenomegaly. No bruits or masses. Good bowel sounds. Extremities: no cyanosis, clubbing, rash, edema Neuro: alert & orientedx3, cranial nerves grossly intact. moves all 4 extremities w/o difficulty. Affect pleasant   Telemetry   AF 60s Personally reviewed   EKG    Atrial fib.   Labs   Basic Metabolic Panel:  Recent Labs Lab 07/19/16 1705 07/19/16 2128 07/20/16 0410  NA  --  135 139  K  --  4.1 3.6  CL  --  99* 104  CO2  --  26 27  GLUCOSE  --  113* 97  BUN 10 10 9   CREATININE 1.00 0.94 0.83  CALCIUM  --  9.2 9.0    Liver Function Tests: No results for input(s): AST, ALT, ALKPHOS, BILITOT, PROT, ALBUMIN in the last 168 hours. No results for input(s): LIPASE, AMYLASE in the last 168 hours. No results for input(s): AMMONIA in the last 168 hours.  CBC:  Recent Labs Lab 07/19/16 1330 07/19/16 2128 07/19/16 2212 07/20/16 0410 07/20/16 0841 07/20/16 1435  WBC 8.7 8.8  --  6.6 7.5 6.9  NEUTROABS  --  6.0  --   --   --   --   HGB 15.6 15.7 14.7 14.5 14.0 13.5  HCT 45.7 47.0  43.0 43.9 41.4 40.8  MCV 91.5 93.4  --  93.6 94.3 93.2  PLT  --  180  --  175 154 165    Cardiac Enzymes: No results for input(s): CKTOTAL, CKMB, CKMBINDEX, TROPONINI in the last 168 hours.  BNP: BNP (last 3 results) No results for input(s): BNP in the last 8760 hours.  ProBNP (last 3 results) No results for input(s): PROBNP in the last 8760 hours.   CBG: No results for input(s): GLUCAP in the last 168 hours.  Coagulation Studies:  Recent Labs  07/19/16 2128  LABPROT 14.6  INR 1.13     Imaging    No results found.   Medications:     Current Medications: . aspirin  81 mg Oral QHS  . atorvastatin  40 mg Oral QHS  . [START ON 07/21/2016] calcium carbonate  3 tablet Oral Q breakfast  . hydrochlorothiazide  25 mg Oral Daily  . timolol  1 drop Both Eyes BID     Infusions:     Patient Profile   83 y/o male with h/o chronic AF on Eliquis admitted with fall (after drinking several glasses of wine) found to have splenic laceration  Assessment/Plan   1. Splenic laceration - discussed with Dr. Hulen Skains. Will hold Eliquis for at least a week. Can hold longer if needed. Will discuss need for re-imaging with Trauma/GSU team.  2. Chronic AF   - CHADSVASC = 4 (Age (2), HTN, CAD) - Rate controlled - OK to hold Eliquis for a week or more. - I think his balance is  stable enough that he won't have frequent falls. Will have PT see - Will discuss with Trauma/GSU about need for reimaging  3. CAD - No s/s ischemia - On ASA and statin   Length of Stay: 0  Glori Bickers, MD  07/20/2016, 8:43 PM  Advanced Heart Failure Team Pager [phone] (M-F; 7a - 4p)  Please contact [facility] for night-coverage after hours (4p -7a ) and weekends on [email or web address]

## 2016-07-21 LAB — BASIC METABOLIC PANEL
ANION GAP: 6 (ref 5–15)
BUN: 11 mg/dL (ref 6–20)
CO2: 27 mmol/L (ref 22–32)
Calcium: 8.8 mg/dL — ABNORMAL LOW (ref 8.9–10.3)
Chloride: 105 mmol/L (ref 101–111)
Creatinine, Ser: 0.99 mg/dL (ref 0.61–1.24)
GFR calc Af Amer: >60 mL/min (ref >60)
GLUCOSE: 93 mg/dL (ref 65–99)
POTASSIUM: 4.8 mmol/L (ref 3.5–5.1)
Sodium: 138 mmol/L (ref 135–145)

## 2016-07-21 LAB — CBC
HCT: 41.5 % (ref 39.0–52.0)
HEMATOCRIT: 43.9 % (ref 39.0–52.0)
HEMOGLOBIN: 13.6 g/dL (ref 13.0–17.0)
HEMOGLOBIN: 14.7 g/dL (ref 13.0–17.0)
MCH: 30.8 pg (ref 26.0–34.0)
MCH: 31.8 pg (ref 26.0–34.0)
MCHC: 32.8 g/dL (ref 30.0–36.0)
MCHC: 33.5 g/dL (ref 30.0–36.0)
MCV: 93.9 fL (ref 78.0–100.0)
MCV: 95 fL (ref 78.0–100.0)
PLATELETS: 156 10*3/uL (ref 150–400)
Platelets: 169 10*3/uL (ref 150–400)
RBC: 4.42 MIL/uL (ref 4.22–5.81)
RBC: 4.62 MIL/uL (ref 4.22–5.81)
RDW: 15 % (ref 11.5–15.5)
RDW: 15.2 % (ref 11.5–15.5)
WBC: 6.9 10*3/uL (ref 4.0–10.5)
WBC: 7.3 10*3/uL (ref 4.0–10.5)

## 2016-07-21 MED ORDER — HYDROCODONE-ACETAMINOPHEN 5-325 MG PO TABS: 1.0000 | ORAL_TABLET | ORAL | 0 refills | Status: DC | PRN

## 2016-07-21 NOTE — Progress Notes (Signed)
Discussed with the patient and all questioned fully answered. He will call me if any problems arise.  IV removed. Telemetry removed. Given paper Rx for pain medication.  Fritz Pickerel, RN

## 2016-07-21 NOTE — Discharge Summary (Signed)
Physician Discharge Summary  Patient ID: Larry Lopez MRN: [id number] DOB/AGE: 09/14/1926 83 y.o.  Admit date: 07/19/2016 Discharge date: 07/21/16  Discharge Diagnoses Fall Right rib fractures Splenic hematoma IVC clot Chronic A. Fib CAD  Consultants Cardiology - Dr. Glori Bickers   Procedures None  Hospital Course: Patient is an 83 y.o. Male who presented to his PCP on 07/19/16 after fall 07/18/16. He was referred to ED from CT scan. He complained of right sided rib pain. No issues with breathing. Took Eliquis in the morning. Reports drinking half a bottle of wine per day. Wife was present with him in the ED. Workup by PCP showed fracture of the 9th lateral rib, grade 1 splenic hematoma, and IVC clot. He was admitted to the trauma service for observation and pain control. Cardiology was consulted for management of anticoagulation in the setting of splenic laceration and recommended that patient could remain off his Eliquis for 1 week and then resume if hgb remained stable. On the day of discharge patient was felt to be clinically stable, hgb was stable, pain was well controlled on oral medications and the patient was discharged in good condition. He was seen by PT/OT and no follow up or equipment was recommended. Patient will follow up in trauma clinic on 7/19.   Physical Exam GEN: NAD, alert, pleasant Resp: normal effort, CTAB, TTP of right posterior chest wall Cardio: Intermittently tachycardic, irregularly irregular GI: Soft, not TTP, +BS, no distention Skin: Warm and dry Psych: A&Ox3  I have personally looked this patient up in the [place] Controlled Substance Database and reviewed their medications.   Allergies as of 07/21/2016   No Known Allergies     Medication List    STOP taking these medications   apixaban 5 MG Tabs tablet Commonly known as:  ELIQUIS     TAKE these medications   aspirin 81 MG tablet Take 81 mg by mouth at bedtime.   atorvastatin 40 MG  tablet Commonly known as:  LIPITOR TAKE 1 BY MOUTH DAILY CONTACT YOUR PROVIDER FOR AN APPOINTMENT What changed:  how much to take  how to take this  when to take this  additional instructions   CALCIUM 600 1500 (600 Ca) MG Tabs tablet Generic drug:  calcium carbonate Take 1 tablet by mouth daily.   GINGER PO See admin instructions. Chew a small portion of ginger root as needed for indigestion   hydrochlorothiazide 25 MG tablet Commonly known as:  HYDRODIURIL TAKE ONE TABLET BY MOUTH ONCE DAILY   HYDROcodone-acetaminophen 5-325 MG tablet Commonly known as:  NORCO/VICODIN Take 1-2 tablets by mouth every 4 (four) hours as needed for moderate pain or severe pain. What changed:  how much to take  when to take this  reasons to take this   metoprolol tartrate 25 MG tablet Commonly known as:  LOPRESSOR Take 1 tablet (25 mg total) by mouth as needed. Only uses if needed for tachycardia. Has not needed in over a year. What changed:  when to take this  additional instructions   nitroGLYCERIN 0.4 MG SL tablet Commonly known as:  NITROSTAT Place 0.4 mg under the tongue every 5 (five) minutes as needed for chest pain.   SUPER B COMPLEX PO Take 1 tablet by mouth daily.   timolol 0.5 % ophthalmic solution Commonly known as:  BETIMOL Place 1 drop into both eyes 2 (two) times daily.        Follow-up Information    [facility]. Go on  07/29/2016.   Why:  Your appointment is at 10:45 am. Please arrive 30 min prior to appointment time. Bring photo ID and insurance information. Please go to [facility] the day prior for a chest x-ray. Please go to [facility] the day prior for labs.  Contact information: [place] [phone]          Signed: Brigid Re , [facility] 07/22/2016, 12:11 PM Pager: [phone] Trauma: [phone] Mon-Fri 7:00 am-4:30 pm Sat-Sun 7:00  am-11:30 am

## 2016-07-21 NOTE — Evaluation (Signed)
Occupational Therapy Evaluation Patient Details Name: Larry Lopez MRN: [id number] DOB: 07/10/1926 Today's Date: 07/21/2016    History of Present Illness Pt is a 83yo male with R rib cage pain s/p mechanical fall 07/19/16, dx with splenic hematoma and right rib fxs. PMH of CAD, a-fib, HTN, hyperlipidemia and balance issues in the past with alcohol use.    Clinical Impression   Patient evaluated by Occupational Therapy with no further acute OT needs identified. All education has been completed and the patient has no further questions. All education completed.  Pt is mod I with ADLs  See below for any follow-up Occupational Therapy or equipment needs. OT is signing off. Thank you for this referral.      Follow Up Recommendations  No OT follow up;Supervision - Intermittent    Equipment Recommendations  None recommended by OT    Recommendations for Other Services       Precautions / Restrictions        Mobility Bed Mobility Overal bed mobility: Modified Independent             General bed mobility comments: Pt able to log roll and move sidelying <> sitting mod I  Transfers Overall transfer level: Modified independent                    Balance                                           ADL either performed or assessed with clinical judgement   ADL Overall ADL's : Modified independent                                       General ADL Comments: reviewed safety with ADLs - sit to don/doff pants, don Rt UE first.  Avoid lifting and pulling.  Instructed him to avoid laundry or other heavy activities until cleared by MD.  Also instructed pt to use urinal at night to avoid ambulating to BR at night      Vision         Perception     Praxis      Pertinent Vitals/Pain Pain Assessment: 0-10 Pain Score: 8  Pain Location: R rib cage with movement Pain Descriptors / Indicators: Aching;Guarding;Grimacing;Tender Pain  Intervention(s): Monitored during session     Hand Dominance     Extremity/Trunk Assessment Upper Extremity Assessment Upper Extremity Assessment: Overall WFL for tasks assessed   Lower Extremity Assessment Lower Extremity Assessment: Defer to PT evaluation   Cervical / Trunk Assessment Cervical / Trunk Assessment: Normal   Communication Communication Communication: HOH   Cognition Arousal/Alertness: Awake/alert Behavior During Therapy: WFL for tasks assessed/performed Overall Cognitive Status: Within Functional Limits for tasks assessed                                     General Comments       Exercises     Shoulder Instructions      Home Living Family/patient expects to be discharged to:: Private residence Living Arrangements: Spouse/significant other Available Help at Discharge: Family;Available 24 hours/day Type of Home: House Home Access: Stairs to enter [employer] of Steps: 2 Entrance Stairs-Rails: None Home Layout:  One level     Bathroom Shower/Tub: Occupational psychologist: Standard Bathroom Accessibility: Yes   Home Equipment: Grab bars - tub/shower          Prior Functioning/Environment Level of Independence: Independent                 OT Problem List: Pain      OT Treatment/Interventions:      OT Goals(Current goals can be found in the care plan section) Acute Rehab OT Goals Patient Stated Goal: go home OT Goal Formulation: All assessment and education complete, DC therapy  OT Frequency:     Barriers to D/C:            Co-evaluation              AM-PAC PT "6 Clicks" Daily Activity     Outcome Measure Help from another person eating meals?: None Help from another person taking care of personal grooming?: None Help from another person toileting, which includes using toliet, bedpan, or urinal?: None Help from another person bathing (including washing, rinsing, drying)?: None Help  from another person to put on and taking off regular upper body clothing?: None Help from another person to put on and taking off regular lower body clothing?: None 6 Click Score: 24   End of Session Nurse Communication: Mobility status  Activity Tolerance: Patient tolerated treatment well Patient left: in chair;with call bell/phone within reach;with family/visitor present  OT Visit Diagnosis: Pain Pain - Right/Left: Right Pain - part of body:  (ribs )                Time: 1696-7893 OT Time Calculation (min): 44 min Charges:  OT General Charges $OT Visit: 1 Procedure OT Evaluation $OT Eval Low Complexity: 1 Procedure OT Treatments $Self Care/Home Management : 23-37 mins G-Codes:     [facility], OTR/L [phone]   Leeyah Heather M 07/21/2016, 2:40 PM

## 2016-07-21 NOTE — Progress Notes (Signed)
Advanced Heart Failure Rounding Note  PCP:  Primary Cardiologist: Dr Haroldine Laws  Subjective:    Admitted after fall. Splenic Laceration. Eliquis on hold.   Overall feels good. Denies SOB. Complaining of dull r lateral abd pain.    Objective:   Weight Range: 70.3 kg (155 lb) Body mass index is 21.62 kg/m.   Vital Signs:   Temp:  [97.9 F (36.6 C)-98.7 F (37.1 C)] 97.9 F (36.6 C) (07/11 0400) Pulse Rate:  [66-98] 84 (07/11 0845) Resp:  [14] 14 (07/11 0845) BP: (101-150)/(67-85) 150/82 (07/11 0400) SpO2:  [95 %-98 %] 96 % (07/11 0845) Last BM Date: 07/20/16  Weight change: Filed Weights   07/19/16 2117  Weight: 70.3 kg (155 lb)    Intake/Output:   Intake/Output Summary (Last 24 hours) at 07/21/16 1139 Last data filed at 07/21/16 0500  Gross per 24 hour  Intake              360 ml  Output              551 ml  Net             -191 ml      Physical Exam    General:  Well appearing. No resp difficulty. Sitting in the chair HEENT: Normal Neck: Supple. JVP 5-6 . Carotids 2+ bilat; no bruits. No lymphadenopathy or thyromegaly appreciated. Cor: PMI nondisplaced. Irregular Regular rate & rhythm. No rubs, gallops or murmurs. Lungs: Clear Abdomen: Soft, R lateral abdomen, nondistended. No hepatosplenomegaly. No bruits or masses. Good bowel sounds. Extremities: No cyanosis, clubbing, rash, edema Neuro: Alert & orientedx3, cranial nerves grossly intact. moves all 4 extremities w/o difficulty. Affect pleasant   Telemetry  A fib 90s   EKG     Afib  Labs    CBC  Recent Labs  07/19/16 2128  07/21/16 0402 07/21/16 0914  WBC 8.8  < > 6.9 7.3  NEUTROABS 6.0  --   --   --   HGB 15.7  < > 13.6 14.7  HCT 47.0  < > 41.5 43.9  MCV 93.4  < > 93.9 95.0  PLT 180  < > 156 169  < > = values in this interval not displayed. Basic Metabolic Panel  Recent Labs  07/20/16 0410 07/21/16 0402  NA 139 138  K 3.6 4.8  CL 104 105  CO2 27 27  GLUCOSE 97 93  BUN 9  11  CREATININE 0.83 0.99  CALCIUM 9.0 8.8*   Liver Function Tests No results for input(s): AST, ALT, ALKPHOS, BILITOT, PROT, ALBUMIN in the last 72 hours. No results for input(s): LIPASE, AMYLASE in the last 72 hours. Cardiac Enzymes No results for input(s): CKTOTAL, CKMB, CKMBINDEX, TROPONINI in the last 72 hours.  BNP: BNP (last 3 results) No results for input(s): BNP in the last 8760 hours.  ProBNP (last 3 results) No results for input(s): PROBNP in the last 8760 hours.   D-Dimer No results for input(s): DDIMER in the last 72 hours. Hemoglobin A1C No results for input(s): HGBA1C in the last 72 hours. Fasting Lipid Panel No results for input(s): CHOL, HDL, LDLCALC, TRIG, CHOLHDL, LDLDIRECT in the last 72 hours. Thyroid Function Tests No results for input(s): TSH, T4TOTAL, T3FREE, THYROIDAB in the last 72 hours.  Invalid input(s): FREET3  Other results:   Imaging     No results found.   Medications:     Scheduled Medications: . aspirin  81 mg Oral QHS  .  atorvastatin  40 mg Oral QHS  . calcium carbonate  3 tablet Oral Q breakfast  . hydrochlorothiazide  25 mg Oral Daily  . timolol  1 drop Both Eyes BID     Infusions:   PRN Medications:  acetaminophen, HYDROcodone-acetaminophen, metoprolol tartrate, morphine injection, nitroGLYCERIN, ondansetron **OR** ondansetron (ZOFRAN) IV    Patient Profile   83 y/o male with h/o chronic AF on Eliquis admitted with fall (after drinking several glasses of wine) found to have splenic laceration   Assessment/Plan   1. Splenic laceration - Per Trauma.  - Hold eliquis 1-2 weeks.   2. Chronic AF   - CHADSVASC = 4 (Age (2), HTN, CAD) -Rate controlled.  - OK to hold Eliquis for a week or more. Ambulated in the hall without difficutl.  -  3. CAD - No s/s ischemia - On ASA and statin    Length of Stay: 1   Amy Clegg, NP  07/21/2016, 11:39 AM  Advanced Heart Failure Team Pager [phone] (M-F; 7a - 4p)    Please contact [facility] for night-coverage after hours (4p -7a ) and weekends on [email or web address]  Patient seen and examined with Darrick Grinder, NP. We discussed all aspects of the encounter. I agree with the assessment and plan as stated above.   He is stable. Ready for d/c today. Will wait 2 weeks before restarting Eliquis.   Glori Bickers, MD  3:24 PM

## 2016-07-21 NOTE — Discharge Instructions (Signed)
Rib Fracture A rib fracture is a break or crack in one of the bones of the ribs. The ribs are like a cage that goes around your upper chest. A broken or cracked rib is often painful, but most do not cause other problems. Most rib fractures heal on their own in 1-3 months. Follow these instructions at home:  Avoid activities that cause pain to the injured area. Protect your injured area.  Slowly increase activity as told by your doctor.  Take medicine as told by your doctor.  Put ice on the injured area for the first 1-2 days after you have been treated or as told by your doctor. ? Put ice in a plastic bag. ? Place a towel between your skin and the bag. ? Leave the ice on for 15-20 minutes at a time, every 2 hours while you are awake.  Do deep breathing as told by your doctor. You may be told to: ? Take deep breaths many times a day. ? Cough many times a day while hugging a pillow. ? Use a device (incentive spirometer) to perform deep breathing many times a day.  Drink enough fluids to keep your pee (urine) clear or pale yellow.  Do not wear a rib belt or binder. These do not allow you to breathe deeply. Get help right away if:  You have a fever.  You have trouble breathing.  You cannot stop coughing.  You cough up thick or bloody spit (mucus).  You feel sick to your stomach (nauseous), throw up (vomit), or have belly (abdominal) pain.  Your pain gets worse and medicine does not help. This information is not intended to replace advice given to you by your health care provider. Make sure you discuss any questions you have with your health care provider. Document Released: 10/07/2007 Document Revised: 06/05/2015 Document Reviewed: 03/01/2012 Elsevier Interactive Patient Education  2018 [employer].   Please go to [facility] for chest x-ray on 07/28/16. [address],  [id number]   Please go to [facility] on 07/28/16 to check labs.  [place] on the 2nd floor   You can take colace or another stool softener or a gentle laxative like miralax as needed for constipation.   DO NOT TAKE YOUR ELAQUIS FOR THE NEXT WEEK. YOU CAN RESUME YOUR ELAQUIS ON 07/27/16.

## 2016-07-21 NOTE — Care Management Note (Signed)
Case Management Note  Patient Details  Name: Larry Lopez MRN: [id number] Date of Birth: Jun 13, 1926  Subjective/Objective:  Pt is a 83yo male with R rib cage pain s/p mechanical fall 07/19/16, dx with splenic hematoma and right rib fxs.  PTA, pt resided at home with spouse.              Action/Plan: PT recommending no OP follow up.  Family able to provide 24h care at discharge.  Will follow progress.  Expected Discharge Date:  07/21/16               Expected Discharge Plan:  Home/Self Care  In-House Referral:  Clinical Social Work  Discharge planning Services  CM Consult  Post Acute Care Choice:    Choice offered to:     DME Arranged:    DME Agency:     HH Arranged:    HH Agency:     Status of Service:  Completed, signed off  If discussed at [employer], dates discussed:    Additional Comments:  07/21/16 J. Alissia Lory, RN, BSN  Pt medically stable for discharge today with family.  No discharge needs identified.    Reinaldo Raddle, RN, BSN  Trauma/Neuro ICU Case Manager [phone]

## 2016-07-21 NOTE — Progress Notes (Signed)
OT Cancellation Note  Patient Details Name: Larry Lopez MRN: [id number] DOB: Jul 17, 1926   Cancelled Treatment:    Reason Eval/Treat Not Completed: Patient at procedure or test/ unavailable.  Pt not in room. Will reattempt.  Johnell Bas Cambria, OTR/L [phone]   Lucille Passy M 07/21/2016, 11:10 AM

## 2016-07-26 ENCOUNTER — Telehealth: Payer: Self-pay | Admitting: Family Medicine

## 2016-07-26 NOTE — Telephone Encounter (Signed)
Pt was seen here the other day and was sent to the hospital with broken ribs.  He has been taking pain medication as prescribed and has not been in too much pain but really dark bruising has been showing up in that same area.  The bruising was not there yesterday and his wife is concerned of internal bleeding.  Pt is wondering if this is normal and could wait until he is seen by trauma physician on Thursday or if he needs to be seen today.  Please advise! [phone]

## 2016-07-27 NOTE — Telephone Encounter (Signed)
Please advise 

## 2016-07-28 ENCOUNTER — Other Ambulatory Visit: Payer: Self-pay | Admitting: Physician Assistant

## 2016-07-28 ENCOUNTER — Ambulatory Visit
Admission: RE | Admit: 2016-07-28 | Discharge: 2016-07-28 | Disposition: A | Discharge status: Home / Self Care | Payer: Medicare Other | Source: Ambulatory Visit | Attending: Physician Assistant | Admitting: Physician Assistant

## 2016-07-28 DIAGNOSIS — S2241XA Multiple fractures of ribs, right side, initial encounter for closed fracture: Secondary | ICD-10-CM | POA: Diagnosis not present

## 2016-07-28 DIAGNOSIS — S2249XA Multiple fractures of ribs, unspecified side, initial encounter for closed fracture: Secondary | ICD-10-CM

## 2016-07-28 DIAGNOSIS — S2239XA Fracture of one rib, unspecified side, initial encounter for closed fracture: Secondary | ICD-10-CM | POA: Diagnosis not present

## 2016-07-28 NOTE — Telephone Encounter (Signed)
He did have rib fractures, so some of the bruising from those fractures could be making way to skin surface. If he is not having any new symptoms, then should be ok to follow up with trauma surgeon as planned tomorrow.

## 2016-07-29 DIAGNOSIS — S2239XA Fracture of one rib, unspecified side, initial encounter for closed fracture: Secondary | ICD-10-CM | POA: Diagnosis not present

## 2016-07-29 NOTE — Telephone Encounter (Signed)
LVM with detailed message and to call back with any concerns.

## 2016-08-04 ENCOUNTER — Ambulatory Visit
Admission: RE | Admit: 2016-08-04 | Discharge: 2016-08-04 | Disposition: A | Discharge status: Home / Self Care | Payer: Medicare Other | Source: Ambulatory Visit | Attending: Physician Assistant | Admitting: Physician Assistant

## 2016-08-04 ENCOUNTER — Other Ambulatory Visit: Payer: Self-pay | Admitting: Physician Assistant

## 2016-08-04 DIAGNOSIS — S2249XA Multiple fractures of ribs, unspecified side, initial encounter for closed fracture: Secondary | ICD-10-CM

## 2016-08-04 DIAGNOSIS — J90 Pleural effusion, not elsewhere classified: Secondary | ICD-10-CM | POA: Diagnosis not present

## 2016-08-05 DIAGNOSIS — S2239XA Fracture of one rib, unspecified side, initial encounter for closed fracture: Secondary | ICD-10-CM | POA: Diagnosis not present

## 2016-08-16 DIAGNOSIS — H40243 Residual stage of angle-closure glaucoma, bilateral: Secondary | ICD-10-CM | POA: Diagnosis not present

## 2016-08-16 DIAGNOSIS — H401133 Primary open-angle glaucoma, bilateral, severe stage: Secondary | ICD-10-CM | POA: Diagnosis not present

## 2016-08-16 DIAGNOSIS — H4720 Unspecified optic atrophy: Secondary | ICD-10-CM | POA: Diagnosis not present

## 2016-08-16 DIAGNOSIS — Z961 Presence of intraocular lens: Secondary | ICD-10-CM | POA: Diagnosis not present

## 2016-09-20 DIAGNOSIS — H401133 Primary open-angle glaucoma, bilateral, severe stage: Secondary | ICD-10-CM | POA: Diagnosis not present

## 2016-09-20 DIAGNOSIS — Z961 Presence of intraocular lens: Secondary | ICD-10-CM | POA: Diagnosis not present

## 2016-10-14 ENCOUNTER — Other Ambulatory Visit (HOSPITAL_COMMUNITY): Payer: Self-pay | Admitting: Internal Medicine

## 2016-10-19 DIAGNOSIS — Z23 Encounter for immunization: Secondary | ICD-10-CM | POA: Diagnosis not present

## 2016-11-18 ENCOUNTER — Other Ambulatory Visit (HOSPITAL_COMMUNITY): Payer: Self-pay | Admitting: Internal Medicine

## 2016-11-19 ENCOUNTER — Other Ambulatory Visit (HOSPITAL_COMMUNITY): Payer: Self-pay | Admitting: *Deleted

## 2016-11-22 ENCOUNTER — Telehealth (HOSPITAL_COMMUNITY): Payer: Self-pay | Admitting: *Deleted

## 2016-11-22 NOTE — Telephone Encounter (Signed)
Pt left VM requesting samples of Eliquis as he is in the donut hole.  Spoke w/pt, he has an appt Thur and will get samples at that time, samples left at desk.  Medication Samples have been provided to the patient.  Drug name: Eliquis       Strength: 5mg         Qty: 4 LOT: [id number]  Exp.Date: 12/20  Dosing instructions: 1 tab Twice daily   The patient has been instructed regarding the correct time, dose, and frequency of taking this medication, including desired effects and most common side effects.   Kimmie Berggren 5:06 PM 11/22/2016

## 2016-11-25 ENCOUNTER — Ambulatory Visit (HOSPITAL_COMMUNITY)
Admission: RE | Admit: 2016-11-25 | Discharge: 2016-11-25 | Disposition: A | Discharge status: Home / Self Care | Payer: Medicare Other | Source: Ambulatory Visit | Attending: Internal Medicine | Admitting: Internal Medicine

## 2016-11-25 ENCOUNTER — Other Ambulatory Visit: Payer: Self-pay

## 2016-11-25 VITALS — BP 160/76 | HR 86 | Wt 148.8 lb

## 2016-11-25 DIAGNOSIS — I4821 Permanent atrial fibrillation: Secondary | ICD-10-CM

## 2016-11-25 DIAGNOSIS — Z7901 Long term (current) use of anticoagulants: Secondary | ICD-10-CM | POA: Insufficient documentation

## 2016-11-25 DIAGNOSIS — I482 Chronic atrial fibrillation: Secondary | ICD-10-CM | POA: Diagnosis not present

## 2016-11-25 DIAGNOSIS — Z951 Presence of aortocoronary bypass graft: Secondary | ICD-10-CM | POA: Diagnosis not present

## 2016-11-25 DIAGNOSIS — Z79899 Other long term (current) drug therapy: Secondary | ICD-10-CM | POA: Diagnosis not present

## 2016-11-25 DIAGNOSIS — E785 Hyperlipidemia, unspecified: Secondary | ICD-10-CM | POA: Diagnosis not present

## 2016-11-25 DIAGNOSIS — Z7982 Long term (current) use of aspirin: Secondary | ICD-10-CM | POA: Diagnosis not present

## 2016-11-25 DIAGNOSIS — I2510 Atherosclerotic heart disease of native coronary artery without angina pectoris: Secondary | ICD-10-CM | POA: Diagnosis not present

## 2016-11-25 DIAGNOSIS — I10 Essential (primary) hypertension: Secondary | ICD-10-CM | POA: Insufficient documentation

## 2016-11-25 DIAGNOSIS — G629 Polyneuropathy, unspecified: Secondary | ICD-10-CM | POA: Insufficient documentation

## 2016-11-25 NOTE — Addendum Note (Signed)
Encounter addended by: Scarlette Calico, RN on: 11/25/2016 11:06 AM  Actions taken: Order list changed, Sign clinical note

## 2016-11-25 NOTE — Patient Instructions (Signed)
Stop Aspirin  We will contact you in 6 months to schedule your next appointment.

## 2016-11-25 NOTE — Progress Notes (Signed)
Patient ID: Larry Lopez, male   DOB: Mar 18, 1926, 83 y.o.   MRN: [id number]  HPI:  Larry Lopez ('[name]") is a 83 y.o. male  with a history of a coronary artery disease status post redo bypass surgery in 2002 by Dr. Cyndia Bent.[name] He also has a history of hypertension and hyperlipidemia, PAF complicated by  tachy-brady syndrome with significant resting bradycardia prohibiting AV-Nodal blockade. Was hospitalized in 6/10 with rapid AF.  He saw Dr. Caryl Comes previously for consideration of pacemaker but it was thought that as AF is infrequent and self-limiting and his bradycardia is asymptomatic  that we would watch it for now and avoid AV-nodal blockers due to bradycardia. If AF burden increased would then consider pacing and re-insitutuion of AV-nodal blockers.  On 02/06/13 underwent DC-CV for recurrent AF but quickly reverted back to AF.   He had Lexiscan Myoview 08/08/13 EF 56% Echo read as 40-45% (Dr. Haroldine Laws reviewed personally and thought EF closer to 50-55%.  Had fall in 7/18 with splenic laceration Eliquis stopped for several weeks.   Says he feels good but feels like he is getting older. Harder for him to walk due to poor balance and neuropathy. Has seen Neurology and gone through [facility]. Continues to do his balance exercises. Does body weight and small dumbbell (5lb) exercises and Y provided exercises multiple times a week. Able to do ADLs. No tachypalpitations or dizziness. Back on Eliquis without bleeding. BP well controlled at home .   ECG: AF 73 Personally reviewed   Lab Results  Component Value Date   CHOL 145 04/27/2016   HDL 58.20 04/27/2016   LDLCALC 72 04/27/2016   TRIG 74.0 04/27/2016   CHOLHDL 2 04/27/2016    ROS: All systems negative except as listed in HPI, PMH and Problem List.  Past Medical History:  Diagnosis Date  . Atrial fibrillation ([place])   . Bradycardia   . CAD (coronary artery disease)   . Glaucoma   . HLD (hyperlipidemia)   . HTN (hypertension)   .  Tachy-brady syndrome ([place])     Current Outpatient Medications  Medication Sig Dispense Refill  . apixaban (ELIQUIS) 5 MG TABS tablet Take 5 mg 2 (two) times daily by mouth.    [name] aspirin 81 MG tablet Take 81 mg by mouth at bedtime.     [name] atorvastatin (LIPITOR) 40 MG tablet TAKE ONE TABLET BY MOUTH ONCE DAILY 90 tablet 3  . B Complex-C (SUPER B COMPLEX PO) Take 1 tablet by mouth daily.     . Calcium Carbonate (CALCIUM 600) 1500 MG TABS Take 1 tablet by mouth daily.     . Ginger, Zingiber officinalis, (GINGER PO) See admin instructions. Chew a small portion of ginger root as needed for indigestion    . hydrochlorothiazide (HYDRODIURIL) 25 MG tablet TAKE ONE TABLET BY MOUTH ONCE DAILY 90 tablet 3  . HYDROcodone-acetaminophen (NORCO/VICODIN) 5-325 MG tablet Take 1-2 tablets by mouth every 4 (four) hours as needed for moderate pain or severe pain. 30 tablet 0  . metoprolol tartrate (LOPRESSOR) 25 MG tablet Take 1 tablet (25 mg total) by mouth as needed. Only uses if needed for tachycardia. Has not needed in over a year. 90 tablet 2  . nitroGLYCERIN (NITROSTAT) 0.4 MG SL tablet Place 0.4 mg under the tongue every 5 (five) minutes as needed for chest pain.     [name] timolol (BETIMOL) 0.5 % ophthalmic solution Place 1 drop into both eyes 2 (two) times daily.  No current facility-administered medications for this encounter.     PHYSICAL EXAM: Vitals:   11/25/16 1015  BP: (!) 160/76  Pulse: 86  SpO2: 100%  Weight: 148 lb 12 oz (67.5 kg)   Wt Readings from Last 3 Encounters:  11/25/16 148 lb 12 oz (67.5 kg)  07/19/16 155 lb (70.3 kg)  07/19/16 155 lb 6.4 oz (70.5 kg)    General:  Well appearing. Elderly  No resp difficulty HEENT: normal Neck: supple. no JVD. Carotids 2+ bilat; no bruits. No lymphadenopathy or thryomegaly appreciated. Cor: PMI nondisplaced. Irregular rate & rhythm. No rubs, gallops or murmurs. Lungs: clear Abdomen: soft, nontender, nondistended. No hepatosplenomegaly. No  bruits or masses. Good bowel sounds. Extremities: no cyanosis, clubbing, rash, edema Neuro: alert & orientedx3, cranial nerves grossly intact. moves all 4 extremities w/o difficulty. Affect pleasant   ASSESSMENT & PLAN:  1. Chronic AF - rate controlled. Well tolerated. Stable on Apixaban 5 bid (may need to reduce dose if weight goes lower or renal function changes) 2. HTN - well controlled at home. Continue current therpay.  3. H/o tachy brady syndrome stable - no indication for PPM 4. CAD s/p re-do CABG 2002 - no s/s ischemia - continue statin - stop ASA with Eliquis 5. Hyperlipidemia  - followed by PCP - Goal LDL < 70. Continue statin  6. Neuropathy  Bensimhon, Quillian Quince, MD 10:45 AM

## 2017-01-22 ENCOUNTER — Other Ambulatory Visit (HOSPITAL_COMMUNITY): Payer: Self-pay | Admitting: Internal Medicine

## 2017-04-01 DIAGNOSIS — H401132 Primary open-angle glaucoma, bilateral, moderate stage: Secondary | ICD-10-CM | POA: Diagnosis not present

## 2017-04-01 DIAGNOSIS — H532 Diplopia: Secondary | ICD-10-CM | POA: Diagnosis not present

## 2017-04-01 DIAGNOSIS — Z961 Presence of intraocular lens: Secondary | ICD-10-CM | POA: Diagnosis not present

## 2017-04-12 ENCOUNTER — Other Ambulatory Visit (HOSPITAL_COMMUNITY): Payer: Self-pay | Admitting: *Deleted

## 2017-04-12 MED ORDER — APIXABAN 5 MG PO TABS: 5.0000 mg | ORAL_TABLET | Freq: Two times a day (BID) | ORAL | 3 refills | Status: DC

## 2017-04-18 ENCOUNTER — Other Ambulatory Visit (HOSPITAL_COMMUNITY): Payer: Self-pay | Admitting: *Deleted

## 2017-04-18 MED ORDER — NITROGLYCERIN 0.4 MG SL SUBL: 0.4000 mg | SUBLINGUAL_TABLET | SUBLINGUAL | 6 refills | Status: DC | PRN

## 2017-05-20 ENCOUNTER — Encounter (HOSPITAL_COMMUNITY): Payer: Self-pay | Admitting: Internal Medicine

## 2017-05-20 ENCOUNTER — Ambulatory Visit (HOSPITAL_COMMUNITY)
Admission: RE | Admit: 2017-05-20 | Discharge: 2017-05-20 | Disposition: A | Discharge status: Home / Self Care | Payer: Medicare Other | Source: Ambulatory Visit | Attending: Internal Medicine | Admitting: Internal Medicine

## 2017-05-20 ENCOUNTER — Other Ambulatory Visit: Payer: Self-pay

## 2017-05-20 VITALS — BP 129/76 | HR 80 | Wt 152.5 lb

## 2017-05-20 DIAGNOSIS — Z79899 Other long term (current) drug therapy: Secondary | ICD-10-CM | POA: Insufficient documentation

## 2017-05-20 DIAGNOSIS — Z7982 Long term (current) use of aspirin: Secondary | ICD-10-CM | POA: Insufficient documentation

## 2017-05-20 DIAGNOSIS — H409 Unspecified glaucoma: Secondary | ICD-10-CM | POA: Insufficient documentation

## 2017-05-20 DIAGNOSIS — I10 Essential (primary) hypertension: Secondary | ICD-10-CM | POA: Diagnosis not present

## 2017-05-20 DIAGNOSIS — Z951 Presence of aortocoronary bypass graft: Secondary | ICD-10-CM | POA: Diagnosis not present

## 2017-05-20 DIAGNOSIS — I495 Sick sinus syndrome: Secondary | ICD-10-CM | POA: Insufficient documentation

## 2017-05-20 DIAGNOSIS — E785 Hyperlipidemia, unspecified: Secondary | ICD-10-CM | POA: Diagnosis not present

## 2017-05-20 DIAGNOSIS — I2510 Atherosclerotic heart disease of native coronary artery without angina pectoris: Secondary | ICD-10-CM | POA: Insufficient documentation

## 2017-05-20 DIAGNOSIS — I482 Chronic atrial fibrillation: Secondary | ICD-10-CM | POA: Diagnosis not present

## 2017-05-20 DIAGNOSIS — Z7902 Long term (current) use of antithrombotics/antiplatelets: Secondary | ICD-10-CM | POA: Diagnosis not present

## 2017-05-20 DIAGNOSIS — G629 Polyneuropathy, unspecified: Secondary | ICD-10-CM | POA: Diagnosis not present

## 2017-05-20 DIAGNOSIS — I4821 Permanent atrial fibrillation: Secondary | ICD-10-CM

## 2017-05-20 MED ORDER — APIXABAN 2.5 MG PO TABS: 2.5000 mg | ORAL_TABLET | Freq: Two times a day (BID) | ORAL | 6 refills | Status: DC

## 2017-05-20 NOTE — Addendum Note (Signed)
Encounter addended by: Scarlette Calico, RN on: 05/20/2017 10:15 AM  Actions taken: Pharmacy for encounter modified, Order list changed

## 2017-05-20 NOTE — Patient Instructions (Signed)
Decrease Eliquis to 2.5 mg Twice daily   We will contact you in 9 months to schedule your next appointment.

## 2017-05-20 NOTE — Progress Notes (Signed)
Patient ID: [name], male   DOB: Feb 14, 1926, 84 y.o.   MRN: [id number]  HPI:  Larry Lopez ('[name]") is a 84 y.o. male  with a history of a coronary artery disease status post redo bypass surgery in 2002 by Dr. Cyndia Bent.[name] He also has a history of hypertension and hyperlipidemia, PAF complicated by  tachy-brady syndrome with significant resting bradycardia prohibiting AV-Nodal blockade. Was hospitalized in 6/10 with rapid AF.  He saw Dr. Caryl Comes previously for consideration of pacemaker but it was thought that as AF is infrequent and self-limiting and his bradycardia is asymptomatic  that we would watch it for now and avoid AV-nodal blockers due to bradycardia. If AF burden increased would then consider pacing and re-insitutuion of AV-nodal blockers.  On 02/06/13 underwent DC-CV for recurrent AF but quickly reverted back to AF.   He had Lexiscan Myoview 08/08/13 EF 56% Echo read as 40-45% (Dr. Haroldine Laws reviewed personally and thought EF closer to 50-55%.  Had fall in 7/18 with splenic laceration Eliquis stopped for several weeks.   Here for routine f/u. Says he feels great. Still drinks a lot of wine and is trying to quit. Has peripheral neuropathy and told it may be related to ETOH. Says he doesn't like to exercise but he doesn't like to do it. Does his PT exercises and dumbbell exercise. Also does 100 steps per day. No CP or palpitations. Tolerating Eliquis without bleeding.     Lab Results  Component Value Date   CHOL 145 04/27/2016   HDL 58.20 04/27/2016   LDLCALC 72 04/27/2016   TRIG 74.0 04/27/2016   CHOLHDL 2 04/27/2016    ROS: All systems negative except as listed in HPI, PMH and Problem List.  Past Medical History:  Diagnosis Date  . Atrial fibrillation ([place])   . Bradycardia   . CAD (coronary artery disease)   . Glaucoma   . HLD (hyperlipidemia)   . HTN (hypertension)   . Tachy-brady syndrome ([place])     Current Outpatient Medications  Medication Sig Dispense  Refill  . apixaban (ELIQUIS) 5 MG TABS tablet Take 1 tablet (5 mg total) by mouth 2 (two) times daily. 180 tablet 3  . atorvastatin (LIPITOR) 40 MG tablet TAKE ONE TABLET BY MOUTH ONCE DAILY 90 tablet 3  . B Complex-C (SUPER B COMPLEX PO) Take 1 tablet by mouth daily.     . Calcium Carbonate (CALCIUM 600) 1500 MG TABS Take 1 tablet by mouth daily.     . Ginger, Zingiber officinalis, (GINGER PO) See admin instructions. Chew a small portion of ginger root as needed for indigestion    . hydrochlorothiazide (HYDRODIURIL) 25 MG tablet TAKE ONE TABLET BY MOUTH ONCE DAILY 90 tablet 3  . HYDROcodone-acetaminophen (NORCO/VICODIN) 5-325 MG tablet Take 1-2 tablets by mouth every 4 (four) hours as needed for moderate pain or severe pain. 30 tablet 0  . metoprolol tartrate (LOPRESSOR) 25 MG tablet TAKE ONE TABLET BY MOUTH AS NEEDED. ONLY USES IF NEEDED FOR TACHYCARDIA. HAS NOT NEEDED IN OVER A YEAR 90 tablet 2  . nitroGLYCERIN (NITROSTAT) 0.4 MG SL tablet Place 1 tablet (0.4 mg total) under the tongue every 5 (five) minutes as needed for chest pain. 25 tablet 6  . timolol (BETIMOL) 0.5 % ophthalmic solution Place 1 drop into both eyes 2 (two) times daily.      No current facility-administered medications for this encounter.     PHYSICAL EXAM: Vitals:   05/20/17 0913  BP: 129/76  Pulse: 80  SpO2: 100%  Weight: 152 lb 8 oz (69.2 kg)   Wt Readings from Last 3 Encounters:  05/20/17 152 lb 8 oz (69.2 kg)  11/25/16 148 lb 12 oz (67.5 kg)  07/19/16 155 lb (70.3 kg)    General:  Well appearing. Elderly. Thin.  No resp difficulty HEENT: normal Neck: supple. no JVD. Carotids 2+ bilat; no bruits. No lymphadenopathy or thryomegaly appreciated. Cor: PMI nondisplaced. Regular rate & rhythm. No rubs, gallops or murmurs. Lungs: clear Abdomen: soft, nontender, nondistended. No hepatosplenomegaly. No bruits or masses. Good bowel sounds. Extremities: no cyanosis, clubbing, rash, edema Neuro: alert & orientedx3,  cranial nerves grossly intact. moves all 4 extremities w/o difficulty. Affect pleasant   ASSESSMENT & PLAN:  1. Chronic AF - rate controlled. Well tolerated. With age 67 and weight at home 144 pounds (65.kg) will drop dose to 2.5 bid 2. HTN  - Blood pressure well controlled. Continue current regimen. 3. H/o tachy brady syndrome stable - no indication for PPM 4. CAD s/p re-do CABG 2002 - no s/s ischemia - continue statin - stop ASA with Eliquis 5. Hyperlipidemia  - followed by PCP - Goal LDL < 70. Continue statin  6. Neuropathy - Followed by Neurology   Glori Bickers, MD 9:46 AM

## 2017-05-20 NOTE — Addendum Note (Signed)
Encounter addended by: Scarlette Calico, RN on: 05/20/2017 10:08 AM  Actions taken: Pharmacy for encounter modified, Order list changed, Sign clinical note

## 2017-08-03 ENCOUNTER — Ambulatory Visit (INDEPENDENT_AMBULATORY_CARE_PROVIDER_SITE_OTHER): Payer: Medicare Other | Admitting: *Deleted

## 2017-08-03 VITALS — BP 121/62 | HR 74 | Resp 18 | Ht 71.0 in | Wt 149.0 lb

## 2017-08-03 DIAGNOSIS — Z0000 Encounter for general adult medical examination without abnormal findings: Secondary | ICD-10-CM | POA: Diagnosis not present

## 2017-08-03 NOTE — Patient Instructions (Addendum)
Continue doing brain stimulating activities (puzzles, reading, adult coloring books, staying active) to keep memory sharp.   Continue to eat heart healthy diet (full of fruits, vegetables, whole grains, lean protein, water--limit salt, fat, and sugar intake) and increase physical activity as tolerated.   Larry Lopez , Thank you for taking time to come for your Medicare Wellness Visit. I appreciate your ongoing commitment to your health goals. Please review the following plan we discussed and let me know if I can assist you in the future.   These are the goals we discussed: Goals    . Patient Stated     Continue to be physically and socially active.       This is a list of the screening recommended for you and due dates:  Health Maintenance  Topic Date Due  . Flu Shot  08/11/2017  . Tetanus Vaccine  08/18/2020  . Pneumonia vaccines  Completed     Inguinal Hernia, Adult An inguinal hernia is when fat or the intestines push through the area where the leg meets the lower belly (groin) and make a rounded lump (bulge). This condition happens over time. There are three types of inguinal hernias. These types include:  Hernias that can be pushed back into the belly (are reducible).  Hernias that cannot be pushed back into the belly (are incarcerated).  Hernias that cannot be pushed back into the belly and lose their blood supply (get strangulated). This type needs emergency surgery.  Follow these instructions at home: Lifestyle  Drink enough fluid to keep your urine (pee) clear or pale yellow.  Eat plenty of fruits, vegetables, and whole grains. These have a lot of fiber. Talk with your doctor if you have questions.  Avoid lifting heavy objects.  Avoid standing for long periods of time.  Do not use tobacco products. These include cigarettes, chewing tobacco, or e-cigarettes. If you need help quitting, ask your doctor.  Try to stay at a healthy weight. General instructions  Do  not try to force the hernia back in.  Watch your hernia for any changes in color or size. Let your doctor know if there are any changes.  Take over-the-counter and prescription medicines only as told by your doctor.  Keep all follow-up visits as told by your doctor. This is important. Contact a doctor if:  You have a fever.  You have new symptoms.  Your symptoms get worse. Get help right away if:  The area where the legs meets the lower belly has: ? Pain that gets worse suddenly. ? A bulge that gets bigger suddenly and does not go down. ? A bulge that turns red or purple. ? A bulge that is painful to the touch.  You are a man and your scrotum: ? Suddenly feels painful. ? Suddenly changes in size.  You feel sick to your stomach (nauseous) and this feeling does not go away.  You throw up (vomit) and this keeps happening.  You feel your heart beating a lot more quickly than normal.  You cannot poop (have a bowel movement) or pass gas. This information is not intended to replace advice given to you by your health care provider. Make sure you discuss any questions you have with your health care provider. Document Released: 01/28/2006 Document Revised: 06/05/2015 Document Reviewed: 11/07/2013 Elsevier Interactive Patient Education  2018 [employer].

## 2017-08-03 NOTE — Progress Notes (Signed)
Subjective:   Larry Lopez is a 84 y.o. male who presents for Medicare Annual/Subsequent preventive examination.  Review of Systems:  No ROS.  Medicare Wellness Visit. Additional risk factors are reflected in the social history.  Cardiac Risk Factors include: advanced age (>33men, >88 women);dyslipidemia;male gender;hypertension Sleep patterns: has frequent nighttime awakenings, gets up 3 times nightly to void and sleeps 8-9 hours nightly.    Home Safety/Smoke Alarms: Feels safe in home. Smoke alarms in place.  Living environment; residence and Firearm Safety: 2-story house, no firearms. Lives with wife no needs for DME, good support system Seat Belt Safety/Bike Helmet: Wears seat belt.     Objective:    Vitals: BP 121/62   Pulse 74   Resp 18   Ht 5\' 11"  (1.803 m)   Wt 149 lb (67.6 kg)   SpO2 100%   BMI 20.78 kg/m   Body mass index is 20.78 kg/m.  Advanced Directives 08/03/2017 07/20/2016 07/19/2016 07/09/2014 02/06/2013  Does Patient Have a Medical Advance Directive? Yes Yes Yes Yes Patient has advance directive, copy not in chart  Type of Advance Directive [place];Living will Living will Living will Living will;Healthcare Power of [place];Living will  Does patient want to make changes to medical advance directive? - Yes (Inpatient - patient defers changing a medical advance directive at this time) - - -  Copy of [place] in Chart? No - copy requested - - - -  Pre-existing out of facility DNR order (yellow form or pink MOST form) - - - - No    Tobacco Social History   Tobacco Use  Smoking Status Former Smoker  . Last attempt to quit: 01/12/1967  . Years since quitting: 50.5  Smokeless Tobacco Never Used     Counseling given: Not Answered   Past Medical History:  Diagnosis Date  . Atrial fibrillation ([place])   . Bradycardia   . CAD (coronary artery disease)   . Glaucoma   . HLD (hyperlipidemia)     . HTN (hypertension)   . Tachy-brady syndrome [facility])    Past Surgical History:  Procedure Laterality Date  . CARDIAC SURGERY    . CARDIOVERSION N/A 02/06/2013   Procedure: CARDIOVERSION;  Surgeon: Jolaine Artist, MD;  Location: [facility] ENDOSCOPY;  Service: Cardiovascular;  Laterality: N/A;  . CORONARY ARTERY BYPASS GRAFT    . TONSILLECTOMY AND ADENOIDECTOMY     Family History  Problem Relation Age of Onset  . Cancer Father        Deceased, 71  . Breast cancer Mother        Deceased, 76  . Breast cancer Sister   . Colon cancer Daughter        Living  . Diabetes Neg Hx   . Coronary artery disease Neg Hx    Social History   Socioeconomic History  . Marital status: Married    Spouse name: Not on file  . Number of children: 2  . Years of education: Not on file  . Highest education level: Not on file  Occupational History  . Occupation: Optometrist  Social Needs  . Financial resource strain: Not hard at all  . Food insecurity:    Worry: Never true    Inability: Never true  . Transportation needs:    Medical: No    Non-medical: No  Tobacco Use  . Smoking status: Former Smoker    Last attempt to quit: 01/12/1967  Years since quitting: 50.5  . Smokeless tobacco: Never Used  Substance and Sexual Activity  . Alcohol use: Yes    Alcohol/week: 16.8 oz    Types: 28 Standard drinks or equivalent per week    Comment: 1 bottle of wine daily  . Drug use: No  . Sexual activity: Yes  Lifestyle  . Physical activity:    Days per week: 4 days    Minutes per session: 50 min  . Stress: To some extent  Relationships  . Social connections:    Talks on phone: More than three times a week    Gets together: More than three times a week    Attends religious service: More than 4 times per year    Active member of club or organization: Yes    Attends meetings of clubs or organizations: More than 4 times per year    Relationship status: Married  Other Topics Concern  . Not on file   Social History Narrative   HSG, [place]. 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 Has weathered the recession OK with some adjustments. Advanced Care planning - patient does not want cardiac resuscitation - provided a signed "out of facility" order for home display (Aug '12) and did review the MOST form, providing an unsigned copy to be used for home discussion with report back.           Outpatient Encounter Medications as of 08/03/2017  Medication Sig  . apixaban (ELIQUIS) 2.5 MG TABS tablet Take 1 tablet (2.5 mg total) by mouth 2 (two) times daily.  [name] atorvastatin (LIPITOR) 40 MG tablet TAKE ONE TABLET BY MOUTH ONCE DAILY  . B Complex-C (SUPER B COMPLEX PO) Take 1 tablet by mouth daily.   . Calcium Carbonate (CALCIUM 600) 1500 MG TABS Take 1 tablet by mouth daily.   . Ginger, Zingiber officinalis, (GINGER PO) See admin instructions. Chew a small portion of ginger root as needed for indigestion  . hydrochlorothiazide (HYDRODIURIL) 25 MG tablet TAKE ONE TABLET BY MOUTH ONCE DAILY  . metoprolol tartrate (LOPRESSOR) 25 MG tablet TAKE ONE TABLET BY MOUTH AS NEEDED. ONLY USES IF NEEDED FOR TACHYCARDIA. HAS NOT NEEDED IN OVER A YEAR  . nitroGLYCERIN (NITROSTAT) 0.4 MG SL tablet Place 1 tablet (0.4 mg total) under the tongue every 5 (five) minutes as needed for chest pain.  [name] timolol (BETIMOL) 0.5 % ophthalmic solution Place 1 drop into both eyes 2 (two) times daily.   . [DISCONTINUED] HYDROcodone-acetaminophen (NORCO/VICODIN) 5-325 MG tablet Take 1-2 tablets by mouth every 4 (four) hours as needed for moderate pain or severe pain. (Patient not taking: Reported on 08/03/2017)   No facility-administered encounter medications on file as of 08/03/2017.     Activities of Daily Living In your present state of health, do you have any difficulty  performing the following activities: 08/03/2017  Hearing? Y  Vision? N  Difficulty concentrating or making decisions? N  Walking or climbing stairs? N  Dressing or bathing? N  Doing errands, shopping? N  Preparing Food and eating ? N  Using the Toilet? N  In the past six months, have you accidently leaked urine? Y  Do you have problems with loss of bowel control? N  Managing your Medications? N  Managing your Finances? N  Housekeeping or managing your Housekeeping? N  Some recent data might be  hidden    Patient Care Team: Hoyt Koch, MD as PCP - General (Internal Medicine) Bensimhon, Shaune Pascal, MD (Cardiology) Shon Hough, MD (Ophthalmology) Alda Berthold, DO as Consulting Physician (Neurology)   Assessment:   This is a routine wellness examination for [place]. Physical assessment deferred to PCP.   Exercise Activities and Dietary recommendations Current Exercise Habits: Structured exercise class;Home exercise routine, Type of exercise: walking;strength training/weights;stretching;calisthenics, Time (Minutes): 45, Frequency (Times/Week): 4, Weekly Exercise (Minutes/Week): 180, Intensity: Mild  Diet (meal preparation, eat out, water intake, caffeinated beverages, dairy products, fruits and vegetables): in general, a "healthy" diet  , well balanced   Reviewed heart healthy diet. Encouraged patient to increase daily water and fluid intake.  Goals    . Patient Stated     Continue to be physically and socially active.       Fall Risk Fall Risk  08/03/2017 07/19/2016 05/11/2015 04/01/2015  Falls in the past year? Yes Yes No No  Number falls in past yr: 1 1 - -  Injury with Fall? Yes Yes - -  Comment - back injury - -  Risk for fall due to : Impaired balance/gait;Impaired mobility - - -  Follow up Falls prevention discussed;Education provided - - -    Depression Screen PHQ 2/9 Scores 08/03/2017 07/19/2016 05/11/2015 04/01/2015  PHQ - 2 Score 1 0 0 0  PHQ- 9 Score 3  - - -    Cognitive Function MMSE - Mini Mental State Exam 08/03/2017  Orientation to time 5  Orientation to Place 5  Registration 3  Attention/ Calculation 5  Recall 2  Language- name 2 objects 2  Language- repeat 1  Language- follow 3 step command 3  Language- read & follow direction 1  Write a sentence 1  Copy design 1  Total score 29        Immunization History  Administered Date(s) Administered  . H1N1 12/25/2007  . Influenza Split 09/12/2011  . Influenza Whole 10/23/2008, 12/13/2013  . Influenza-Unspecified 11/11/2012, 10/26/2014  . Pneumococcal Conjugate-13 10/23/2012  . Pneumococcal Polysaccharide-23 05/13/2003  . Tdap 08/19/2010   Screening Tests Health Maintenance  Topic Date Due  . INFLUENZA VACCINE  08/11/2017  . TETANUS/TDAP  08/18/2020  . PNA vac Low Risk Adult  Completed       Plan:     Continue doing brain stimulating activities (puzzles, reading, adult coloring books, staying active) to keep memory sharp.   Continue to eat heart healthy diet (full of fruits, vegetables, whole grains, lean protein, water--limit salt, fat, and sugar intake) and increase physical activity as tolerated.  I have personally reviewed and noted the following in the patient's chart:   . Medical and social history . Use of alcohol, tobacco or illicit drugs  . Current medications and supplements . Functional ability and status . Nutritional status . Physical activity . Advanced directives . List of other physicians . Vitals . Screenings to include cognitive, depression, and falls . Referrals and appointments  In addition, I have reviewed and discussed with patient certain preventive protocols, quality metrics, and best practice recommendations. A written personalized care plan for preventive services as well as general preventive health recommendations were provided to patient.     Michiel Cowboy, RN  08/03/2017

## 2017-08-04 NOTE — Progress Notes (Signed)
Medical screening examination/treatment/procedure(s) were performed by non-physician practitioner and as supervising physician I was immediately available for consultation/collaboration. I agree with above. Aribella Vavra A Veria Stradley, MD 

## 2017-09-01 DIAGNOSIS — H401133 Primary open-angle glaucoma, bilateral, severe stage: Secondary | ICD-10-CM | POA: Diagnosis not present

## 2017-09-01 DIAGNOSIS — Z961 Presence of intraocular lens: Secondary | ICD-10-CM | POA: Diagnosis not present

## 2017-09-01 DIAGNOSIS — H26492 Other secondary cataract, left eye: Secondary | ICD-10-CM | POA: Diagnosis not present

## 2017-09-05 ENCOUNTER — Encounter: Payer: Self-pay | Admitting: Internal Medicine

## 2017-09-05 ENCOUNTER — Ambulatory Visit (INDEPENDENT_AMBULATORY_CARE_PROVIDER_SITE_OTHER): Payer: Medicare Other | Admitting: Internal Medicine

## 2017-09-05 ENCOUNTER — Other Ambulatory Visit (INDEPENDENT_AMBULATORY_CARE_PROVIDER_SITE_OTHER): Payer: Medicare Other

## 2017-09-05 VITALS — BP 90/60 | HR 86 | Temp 97.5°F | Ht 71.0 in | Wt 152.0 lb

## 2017-09-05 DIAGNOSIS — R2689 Other abnormalities of gait and mobility: Secondary | ICD-10-CM | POA: Diagnosis not present

## 2017-09-05 DIAGNOSIS — I2510 Atherosclerotic heart disease of native coronary artery without angina pectoris: Secondary | ICD-10-CM | POA: Diagnosis not present

## 2017-09-05 DIAGNOSIS — K4090 Unilateral inguinal hernia, without obstruction or gangrene, not specified as recurrent: Secondary | ICD-10-CM | POA: Diagnosis not present

## 2017-09-05 DIAGNOSIS — I10 Essential (primary) hypertension: Secondary | ICD-10-CM

## 2017-09-05 DIAGNOSIS — E785 Hyperlipidemia, unspecified: Secondary | ICD-10-CM

## 2017-09-05 DIAGNOSIS — R350 Frequency of micturition: Secondary | ICD-10-CM | POA: Insufficient documentation

## 2017-09-05 LAB — COMPREHENSIVE METABOLIC PANEL
ALT: 16 U/L (ref 0–53)
AST: 21 U/L (ref 0–37)
Albumin: 4 g/dL (ref 3.5–5.2)
Alkaline Phosphatase: 125 U/L — ABNORMAL HIGH (ref 39–117)
BILIRUBIN TOTAL: 1.3 mg/dL — AB (ref 0.2–1.2)
BUN: 11 mg/dL (ref 6–23)
CO2: 30 meq/L (ref 19–32)
CREATININE: 0.91 mg/dL (ref 0.40–1.50)
Calcium: 9.4 mg/dL (ref 8.4–10.5)
Chloride: 107 mEq/L (ref 96–112)
GFR: 83.36 mL/min (ref >60.00)
GLUCOSE: 85 mg/dL (ref 70–99)
Potassium: 4.4 mEq/L (ref 3.5–5.1)
Sodium: 142 mEq/L (ref 135–145)
Total Protein: 6.5 g/dL (ref 6.0–8.3)

## 2017-09-05 LAB — LIPID PANEL
CHOL/HDL RATIO: 2
Cholesterol: 122 mg/dL (ref 0–200)
HDL: 49 mg/dL (ref >39.00)
LDL Cholesterol: 55 mg/dL (ref 0–99)
NONHDL: 73.39
Triglycerides: 93 mg/dL (ref 0.0–149.0)
VLDL: 18.6 mg/dL (ref 0.0–40.0)

## 2017-09-05 LAB — CBC
HCT: 42.8 % (ref 39.0–52.0)
Hemoglobin: 14.5 g/dL (ref 13.0–17.0)
MCHC: 33.8 g/dL (ref 30.0–36.0)
MCV: 92.1 fl (ref 78.0–100.0)
Platelets: 177 10*3/uL (ref 150.0–400.0)
RBC: 4.65 Mil/uL (ref 4.22–5.81)
RDW: 14.6 % (ref 11.5–15.5)
WBC: 5.2 10*3/uL (ref 4.0–10.5)

## 2017-09-05 LAB — PSA: PSA: 4.13 ng/mL — AB (ref 0.10–4.00)

## 2017-09-05 MED ORDER — ZOSTER VAC RECOMB ADJUVANTED 50 MCG/0.5ML IM SUSR
0.5000 mL | Freq: Once | INTRAMUSCULAR | 1 refills | Status: AC
Start: 2017-09-05 — End: 2017-09-05

## 2017-09-05 NOTE — Assessment & Plan Note (Signed)
Taking hctz 25 mg daily. Given BP today without symptoms would recommend continue dosing as most home readings and prior readings are closer to 120/60s. Checking CMP and adjust as needed.

## 2017-09-05 NOTE — Assessment & Plan Note (Signed)
Checking PSA, suspect BPH. He declines meds at this time.

## 2017-09-05 NOTE — Assessment & Plan Note (Signed)
He has a moderate size right inguinal hernia and we talked about recommendation to fix with surgery before it can incarcerate but he declines currently. We talked about warning signs or symptoms of incarceration and need for prompt ER evaluation for this and he is agreeable.

## 2017-09-05 NOTE — Assessment & Plan Note (Signed)
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 He is interested to know if this will recover with time and we talked about the fact that this will likely remain stable. We talked about how ongoing alcohol can cause worsening. He is not sure he wants to stop since he is already 33 and wants to preserve QOL not quantity of time. I agree and we talked about moderate alcohol usage.

## 2017-09-05 NOTE — Patient Instructions (Signed)
We will check the labs today. The lump is a hernia and if you get pain in that area which does not go away go to the emergency room.  Health Maintenance, Male A healthy lifestyle and preventive care is important for your health and wellness. Ask your health care provider about what schedule of regular examinations is right for you. What should I know about weight and diet? Eat a Healthy Diet  Eat plenty of vegetables, fruits, whole grains, low-fat dairy products, and lean protein.  Do not eat a lot of foods high in solid fats, added sugars, or salt.  Maintain a Healthy Weight Regular exercise can help you achieve or maintain a healthy weight. You should:  Do at least 150 minutes of exercise each week. The exercise should increase your heart rate and make you sweat (moderate-intensity exercise).  Do strength-training exercises at least twice a week.  Watch Your Levels of Cholesterol and Blood Lipids  Have your blood tested for lipids and cholesterol every 5 years starting at 82 years of age. If you are at high risk for heart disease, you should start having your blood tested when you are 82 years old. You may need to have your cholesterol levels checked more often if: ? Your lipid or cholesterol levels are high. ? You are older than 82 years of age. ? You are at high risk for heart disease.  What should I know about cancer screening? Many types of cancers can be detected early and may often be prevented. Lung Cancer  You should be screened every year for lung cancer if: ? You are a current smoker who has smoked for at least 30 years. ? You are a former smoker who has quit within the past 15 years.  Talk to your health care provider about your screening options, when you should start screening, and how often you should be screened.  Colorectal Cancer  Routine colorectal cancer screening usually begins at 82 years of age and should be repeated every 5-10 years until you are 82 years  old. You may need to be screened more often if early forms of precancerous polyps or small growths are found. Your health care provider may recommend screening at an earlier age if you have risk factors for colon cancer.  Your health care provider may recommend using home test kits to check for hidden blood in the stool.  A small camera at the end of a tube can be used to examine your colon (sigmoidoscopy or colonoscopy). This checks for the earliest forms of colorectal cancer.  Prostate and Testicular Cancer  Depending on your age and overall health, your health care provider may do certain tests to screen for prostate and testicular cancer.  Talk to your health care provider about any symptoms or concerns you have about testicular or prostate cancer.  Skin Cancer  Check your skin from head to toe regularly.  Tell your health care provider about any new moles or changes in moles, especially if: ? There is a change in a mole's size, shape, or color. ? You have a mole that is larger than a pencil eraser.  Always use sunscreen. Apply sunscreen liberally and repeat throughout the day.  Protect yourself by wearing long sleeves, pants, a wide-brimmed hat, and sunglasses when outside.  What should I know about heart disease, diabetes, and high blood pressure?  If you are 21-36 years of age, have your blood pressure checked every 3-5 years. If you are 40 years  of age or older, have your blood pressure checked every year. You should have your blood pressure measured twice-once when you are at a hospital or clinic, and once when you are not at a hospital or clinic. Record the average of the two measurements. To check your blood pressure when you are not at a hospital or clinic, you can use: ? An automated blood pressure machine at a pharmacy. ? A home blood pressure monitor.  Talk to your health care provider about your target blood pressure.  If you are between 29-10 years old, ask your  health care provider if you should take aspirin to prevent heart disease.  Have regular diabetes screenings by checking your fasting blood sugar level. ? If you are at a normal weight and have a low risk for diabetes, have this test once every three years after the age of 16. ? If you are overweight and have a high risk for diabetes, consider being tested at a younger age or more often.  A one-time screening for abdominal aortic aneurysm (AAA) by ultrasound is recommended for men aged 33-75 years who are current or former smokers. What should I know about preventing infection? Hepatitis B If you have a higher risk for hepatitis B, you should be screened for this virus. Talk with your health care provider to find out if you are at risk for hepatitis B infection. Hepatitis C Blood testing is recommended for:  Everyone born from [birth year] through 1963.  Anyone with known risk factors for hepatitis C.  Sexually Transmitted Diseases (STDs)  You should be screened each year for STDs including gonorrhea and chlamydia if: ? You are sexually active and are younger than 82 years of age. ? You are older than 82 years of age and your health care provider tells you that you are at risk for this type of infection. ? Your sexual activity has changed since you were last screened and you are at an increased risk for chlamydia or gonorrhea. Ask your health care provider if you are at risk.  Talk with your health care provider about whether you are at high risk of being infected with HIV. Your health care provider may recommend a prescription medicine to help prevent HIV infection.  What else can I do?  Schedule regular health, dental, and eye exams.  Stay current with your vaccines (immunizations).  Do not use any tobacco products, such as cigarettes, chewing tobacco, and e-cigarettes. If you need help quitting, ask your health care provider.  Limit alcohol intake to no more than 2 drinks per day. One  drink equals 12 ounces of beer, 5 ounces of wine, or 1 ounces of hard liquor.  Do not use street drugs.  Do not share needles.  Ask your health care provider for help if you need support or information about quitting drugs.  Tell your health care provider if you often feel depressed.  Tell your health care provider if you have ever been abused or do not feel safe at home. This information is not intended to replace advice given to you by your health care provider. Make sure you discuss any questions you have with your health care provider. Document Released: 06/26/2007 Document Revised: 08/27/2015 Document Reviewed: 10/01/2014 Elsevier Interactive Patient Education  [employer].

## 2017-09-05 NOTE — Assessment & Plan Note (Signed)
Checking lipid panel and adjust lipitor 40 mg daily if needed for LDL goal <70.

## 2017-09-05 NOTE — Progress Notes (Signed)
   Subjective:    Patient ID: Larry Lopez, male    DOB: Jan 20, 1926, 84 y.o.   MRN: [id number]  HPI The patient is an 84 YO man coming in for follow up of blood pressure (taking hctz and denies lightheadedness or weakness with standing, some increased napping in the last several years, taking lopressor only if needed for a fib flare and has not taken, denies chest pains or palpitations, denies SOB, denies bleeding or dark stools) and new problem of bulge in groin (on the right side groin, denies pain, seems to bulge at times, not affected by his rowing machine or workout at the gym, works out about 3-5 times per week, denies pain in the area, started about 6-12 months ago, wife has noticed some increase in size since onset) and urinary frequency (urge to urinate every 2 hours or so, can sometimes hold it and the urge will pass, waking up at night about 3 times and urinates, some decrease in stream, wears depends sometimes for security, rare bedwetting) and some worsening neuropathy (seen neurology in the past and thought to be related to alcohol usage, admits to about 1/2 bottle of wine daily, 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   Review of Systems  Constitutional: Positive for activity change. Negative for appetite change, fatigue, fever and unexpected weight change.  HENT: Negative.   Eyes: Negative.   Respiratory: Negative for cough, chest tightness and shortness of breath.   Cardiovascular: Negative for chest pain, palpitations and leg swelling.  Gastrointestinal: Negative for abdominal distention, abdominal pain, constipation, diarrhea, nausea and vomiting.  Genitourinary: Positive for enuresis and frequency. Negative for discharge, hematuria, penile  pain, penile swelling, scrotal swelling and testicular pain.       Bulge in groin  Musculoskeletal: Negative.   Skin: Negative.   Neurological: Negative.        Balance changes, no falls  Psychiatric/Behavioral: Negative.        More napping      Objective:   Physical Exam  Constitutional: He is oriented to person, place, and time. He appears well-developed and well-nourished.  HENT:  Head: Normocephalic and atraumatic.  Eyes: EOM are normal.  Neck: Normal range of motion.  Cardiovascular: Normal rate and regular rhythm.  Pulmonary/Chest: Effort normal and breath sounds normal. No respiratory distress. He has no wheezes. He has no rales.  Abdominal: Soft. Bowel sounds are normal. He exhibits no distension. There is no tenderness. There is no rebound.  Genitourinary:  Genitourinary Comments: Large right inguinal hernia which is reducible  Musculoskeletal: He exhibits no edema.  Neurological: He is alert and oriented to person, place, and time. Coordination normal.  Change in sensation legs to arms, no skin breakdown  Skin: Skin is warm and dry.  Psychiatric: He has a normal mood and affect.   Vitals:   09/05/17 0910  BP: 90/60  Pulse: 86  Temp: (!) 97.5 F (36.4 C)  TempSrc: Oral  SpO2: 99%  Weight: 152 lb (68.9 kg)  Height: 5\' 11"  (1.803 m)      Assessment & Plan:

## 2017-09-30 DIAGNOSIS — Z23 Encounter for immunization: Secondary | ICD-10-CM | POA: Diagnosis not present

## 2017-10-24 ENCOUNTER — Other Ambulatory Visit (HOSPITAL_COMMUNITY): Payer: Self-pay | Admitting: Internal Medicine

## 2017-11-22 DIAGNOSIS — H6123 Impacted cerumen, bilateral: Secondary | ICD-10-CM | POA: Diagnosis not present

## 2017-11-22 DIAGNOSIS — H903 Sensorineural hearing loss, bilateral: Secondary | ICD-10-CM | POA: Diagnosis not present

## 2018-01-23 ENCOUNTER — Other Ambulatory Visit: Payer: Self-pay | Admitting: Internal Medicine

## 2018-01-23 MED ORDER — TAMSULOSIN HCL 0.4 MG PO CAPS: 0.4000 mg | ORAL_CAPSULE | Freq: Every day | ORAL | 3 refills | Status: DC

## 2018-02-02 DIAGNOSIS — H401133 Primary open-angle glaucoma, bilateral, severe stage: Secondary | ICD-10-CM | POA: Diagnosis not present

## 2018-02-02 DIAGNOSIS — H52203 Unspecified astigmatism, bilateral: Secondary | ICD-10-CM | POA: Diagnosis not present

## 2018-02-02 DIAGNOSIS — Z961 Presence of intraocular lens: Secondary | ICD-10-CM | POA: Diagnosis not present

## 2018-02-02 DIAGNOSIS — H43813 Vitreous degeneration, bilateral: Secondary | ICD-10-CM | POA: Diagnosis not present

## 2018-04-26 ENCOUNTER — Other Ambulatory Visit (HOSPITAL_COMMUNITY): Payer: Self-pay | Admitting: Internal Medicine

## 2018-05-12 ENCOUNTER — Other Ambulatory Visit (HOSPITAL_COMMUNITY): Payer: Self-pay | Admitting: Internal Medicine

## 2018-07-10 ENCOUNTER — Other Ambulatory Visit: Payer: Self-pay | Admitting: Internal Medicine

## 2018-07-10 DIAGNOSIS — Z0000 Encounter for general adult medical examination without abnormal findings: Secondary | ICD-10-CM

## 2018-07-11 ENCOUNTER — Other Ambulatory Visit: Payer: Medicare Other

## 2018-07-11 DIAGNOSIS — Z0000 Encounter for general adult medical examination without abnormal findings: Secondary | ICD-10-CM | POA: Diagnosis not present

## 2018-07-12 LAB — SAR COV2 SEROLOGY (COVID19)AB(IGG),IA: SARS CoV2 AB IGG: NEGATIVE

## 2018-07-17 ENCOUNTER — Telehealth: Payer: Self-pay

## 2018-07-17 NOTE — Telephone Encounter (Signed)
Patient informed of results and stated understanding

## 2018-07-17 NOTE — Telephone Encounter (Signed)
Copied from [place] [id number]. Topic: General - Inquiry >> Jul 17, 2018  9:08 AM Parke Poisson wrote: Reason for CRM: Pt would like to get his lab results

## 2018-07-20 ENCOUNTER — Other Ambulatory Visit (HOSPITAL_COMMUNITY): Payer: Self-pay | Admitting: Internal Medicine

## 2018-08-03 DIAGNOSIS — Z961 Presence of intraocular lens: Secondary | ICD-10-CM | POA: Diagnosis not present

## 2018-08-03 DIAGNOSIS — H532 Diplopia: Secondary | ICD-10-CM | POA: Diagnosis not present

## 2018-08-03 DIAGNOSIS — H401133 Primary open-angle glaucoma, bilateral, severe stage: Secondary | ICD-10-CM | POA: Diagnosis not present

## 2018-08-07 ENCOUNTER — Other Ambulatory Visit: Payer: Self-pay

## 2018-08-07 ENCOUNTER — Encounter: Payer: Self-pay | Admitting: Family

## 2018-08-07 ENCOUNTER — Ambulatory Visit (INDEPENDENT_AMBULATORY_CARE_PROVIDER_SITE_OTHER): Payer: Medicare Other | Admitting: Family

## 2018-08-07 VITALS — BP 112/70 | HR 86 | Temp 98.0°F | Ht 71.0 in | Wt 150.2 lb

## 2018-08-07 DIAGNOSIS — H60502 Unspecified acute noninfective otitis externa, left ear: Secondary | ICD-10-CM | POA: Diagnosis not present

## 2018-08-07 MED ORDER — NEOMYCIN-POLYMYXIN-HC 3.5-10000-1 OT SOLN: 3.0000 [drp] | Freq: Four times a day (QID) | OTIC | 0 refills | Status: DC

## 2018-08-07 NOTE — Progress Notes (Signed)
Larry Lopez is a 85 y.o. male with the following history as recorded in EpicCare:  Patient Active Problem List   Diagnosis Date Noted  . Right inguinal hernia 09/05/2017  . Urinary frequency 09/05/2017  . Spleen hematoma 07/20/2016  . Coronary artery disease involving native coronary artery 11/05/2013  . Imbalance 11/05/2013  . Routine health maintenance 08/22/2010  . Hearing loss 08/22/2009  . ATRIAL FIBRILLATION 06/27/2008  . Hyperlipidemia 12/09/2006  . Essential hypertension 12/09/2006  . MYOCARDIAL INFARCTION, HX OF 12/09/2006  . CAD (coronary artery disease) 12/09/2006    Current Outpatient Medications  Medication Sig Dispense Refill  . apixaban (ELIQUIS) 2.5 MG TABS tablet Take 1 tablet (2.5 mg total) by mouth 2 (two) times daily. 60 tablet 6  . atorvastatin (LIPITOR) 40 MG tablet Take 1 tablet by mouth once daily 90 tablet 0  . B Complex-C (SUPER B COMPLEX PO) Take 1 tablet by mouth daily.     . Calcium Carbonate (CALCIUM 600) 1500 MG TABS Take 1 tablet by mouth daily.     . Ginger, Zingiber officinalis, (GINGER PO) See admin instructions. Chew a small portion of ginger root as needed for indigestion    . hydrochlorothiazide (HYDRODIURIL) 25 MG tablet Take 1 tablet by mouth once daily 90 tablet 0  . latanoprost (XALATAN) 0.005 % ophthalmic solution     . metoprolol tartrate (LOPRESSOR) 25 MG tablet TAKE ONE TABLET BY MOUTH AS NEEDED. ONLY USES IF NEEDED FOR TACHYCARDIA. HAS NOT NEEDED IN OVER A YEAR 90 tablet 2  . Multiple Vitamin (MULTI-VITAMIN) tablet Take by mouth.    . nitroGLYCERIN (NITROSTAT) 0.4 MG SL tablet Place 1 tablet (0.4 mg total) under the tongue every 5 (five) minutes as needed for chest pain. 25 tablet 6  . tamsulosin (FLOMAX) 0.4 MG CAPS capsule Take 1 capsule (0.4 mg total) by mouth daily. 90 capsule 3  . timolol (BETIMOL) 0.5 % ophthalmic solution Place 1 drop into both eyes 2 (two) times daily.     [name] neomycin-polymyxin-hydrocortisone (CORTISPORIN) OTIC  solution Place 3 drops into both ears 4 (four) times daily. 10 mL 0  . timolol (TIMOPTIC) 0.5 % ophthalmic solution INSTILL 1 DROP INTO EACH EYE TWICE DAILY     No current facility-administered medications for this visit.     Allergies: Patient has no known allergies.  Past Medical History:  Diagnosis Date  . Atrial fibrillation ([place])   . Bradycardia   . CAD (coronary artery disease)   . Glaucoma   . HLD (hyperlipidemia)   . HTN (hypertension)   . Tachy-brady syndrome [facility])     Past Surgical History:  Procedure Laterality Date  . CARDIAC SURGERY    . CARDIOVERSION N/A 02/06/2013   Procedure: CARDIOVERSION;  Surgeon: Jolaine Artist, MD;  Location: [facility] ENDOSCOPY;  Service: Cardiovascular;  Laterality: N/A;  . CORONARY ARTERY BYPASS GRAFT    . TONSILLECTOMY AND ADENOIDECTOMY      Family History  Problem Relation Age of Onset  . Cancer Father        Deceased, 62  . Breast cancer Mother        Deceased, 24  . Breast cancer Sister   . Colon cancer Daughter        Living  . Diabetes Neg Hx   . Coronary artery disease Neg Hx     Social History   Tobacco Use  . Smoking status: Former Smoker    Quit date: 01/12/1967    Years since quitting: 51.6  .  Smokeless tobacco: Never Used  Substance Use Topics  . Alcohol use: Yes    Alcohol/week: 28.0 standard drinks    Types: 28 Standard drinks or equivalent per week    Comment: 1 bottle of wine daily    Subjective:  Recently had ear wax cleaned by his hearing aid provider; still feels that left ear is irritated/ hearing is not as good; was told he needed to be seen by an "ear doctor" for further evaluation.   Objective:  Vitals:   08/07/18 1027  BP: 112/70  Pulse: 86  Temp: 98 F (36.7 C)  TempSrc: Oral  SpO2: 99%  Weight: 150 lb 3.2 oz (68.1 kg)  Height: 5\' 11"  (1.803 m)    General: Well developed, well nourished, in no acute distress  Skin : Warm and dry.  Head: Normocephalic and atraumatic  Eyes: Sclera and  conjunctiva clear; pupils round and reactive to light; extraocular movements intact  Ears: External normal; outer left canal erythematous; tympanic membranes normal  Oropharynx: Pink, supple. No suspicious lesions  Lungs: Respirations unlabored;  Neurologic: Alert and oriented; speech intact; face symmetrical; moves all extremities well;   Assessment:  1. Acute otitis externa of left ear, unspecified type     Plan:  Rx for Cortisporin Otic suspension- use as directed; follow-up worse, no better.   No follow-ups on file.  No orders of the defined types were placed in this encounter.   Requested Prescriptions   Signed Prescriptions Disp Refills  . neomycin-polymyxin-hydrocortisone (CORTISPORIN) OTIC solution 10 mL 0    Sig: Place 3 drops into both ears 4 (four) times daily.

## 2018-08-07 NOTE — Patient Instructions (Signed)

## 2018-08-11 DIAGNOSIS — H60333 Swimmer's ear, bilateral: Secondary | ICD-10-CM | POA: Diagnosis not present

## 2018-08-11 DIAGNOSIS — H6123 Impacted cerumen, bilateral: Secondary | ICD-10-CM | POA: Diagnosis not present

## 2018-08-16 DIAGNOSIS — H6123 Impacted cerumen, bilateral: Secondary | ICD-10-CM | POA: Diagnosis not present

## 2018-08-16 DIAGNOSIS — H6993 Unspecified Eustachian tube disorder, bilateral: Secondary | ICD-10-CM | POA: Diagnosis not present

## 2018-08-21 DIAGNOSIS — H60333 Swimmer's ear, bilateral: Secondary | ICD-10-CM | POA: Diagnosis not present

## 2018-08-25 DIAGNOSIS — H60333 Swimmer's ear, bilateral: Secondary | ICD-10-CM | POA: Diagnosis not present

## 2018-08-29 DIAGNOSIS — H938X2 Other specified disorders of left ear: Secondary | ICD-10-CM | POA: Diagnosis not present

## 2018-08-29 DIAGNOSIS — Z87891 Personal history of nicotine dependence: Secondary | ICD-10-CM | POA: Diagnosis not present

## 2018-08-29 DIAGNOSIS — H9202 Otalgia, left ear: Secondary | ICD-10-CM | POA: Diagnosis not present

## 2018-08-29 DIAGNOSIS — M2669 Other specified disorders of temporomandibular joint: Secondary | ICD-10-CM | POA: Diagnosis not present

## 2018-08-30 DIAGNOSIS — H60333 Swimmer's ear, bilateral: Secondary | ICD-10-CM | POA: Diagnosis not present

## 2018-09-04 DIAGNOSIS — H60333 Swimmer's ear, bilateral: Secondary | ICD-10-CM | POA: Diagnosis not present

## 2018-09-08 DIAGNOSIS — H60333 Swimmer's ear, bilateral: Secondary | ICD-10-CM | POA: Diagnosis not present

## 2018-09-08 DIAGNOSIS — Z23 Encounter for immunization: Secondary | ICD-10-CM | POA: Diagnosis not present

## 2018-10-05 NOTE — Progress Notes (Addendum)
Subjective:   Larry Lopez is a 85 y.o. male who presents for Medicare Annual/Subsequent preventive examination.  Review of Systems:   Cardiac Risk Factors include: dyslipidemia;male gender;hypertension;advanced age (>71men, >67 women) Sleep patterns: feels rested on waking, gets up 1-2 times nightly to void and sleeps 7-8 hours nightly.    Home Safety/Smoke Alarms: Feels safe in home. Smoke alarms in place.  Living environment; residence and Firearm Safety: [place], can live on one level, equipment: Radio producer, Type: [place]. Lives with wife ,no needs for DME, good support system Seat Belt Safety/Bike Helmet: Wears seat belt.      Objective:    Vitals: BP 126/62   Pulse 72   Resp 19   Ht 5\' 11"  (1.803 m)   Wt 151 lb (68.5 kg)   SpO2 99%   BMI 21.06 kg/m   Body mass index is 21.06 kg/m.  Advanced Directives 10/06/2018 08/03/2017 07/20/2016 07/19/2016 07/09/2014 02/06/2013  Does Patient Have a Medical Advance Directive? Yes Yes Yes Yes Yes Patient has advance directive, copy not in chart  Type of Advance Directive [place];Living will [place];Living will Living will Living will Living will;Healthcare Power of [place];Living will  Does patient want to make changes to medical advance directive? - - Yes (Inpatient - patient defers changing a medical advance directive at this time) - - -  Copy of [place] in Chart? No - copy requested No - copy requested - - - -  Pre-existing out of facility DNR order (yellow form or pink MOST form) - - - - - No    Tobacco Social History   Tobacco Use  Smoking Status Former Smoker  . Quit date: 01/12/1967  . Years since quitting: 51.7  Smokeless Tobacco Never Used     Counseling given: Not Answered  Past Medical History:  Diagnosis Date  . Atrial fibrillation ([place])   . Bradycardia   . CAD (coronary artery disease)   . Glaucoma   . HLD  (hyperlipidemia)   . HTN (hypertension)   . Tachy-brady syndrome [facility])    Past Surgical History:  Procedure Laterality Date  . CARDIAC SURGERY    . CARDIOVERSION N/A 02/06/2013   Procedure: CARDIOVERSION;  Surgeon: Jolaine Artist, MD;  Location: [facility] ENDOSCOPY;  Service: Cardiovascular;  Laterality: N/A;  . CORONARY ARTERY BYPASS GRAFT    . TONSILLECTOMY AND ADENOIDECTOMY     Family History  Problem Relation Age of Onset  . Cancer Father        Deceased, 39  . Breast cancer Mother        Deceased, 27  . Breast cancer Sister   . Colon cancer Daughter        Living  . Diabetes Neg Hx   . Coronary artery disease Neg Hx    Social History   Socioeconomic History  . Marital status: Married    Spouse name: Not on file  . Number of children: 2  . Years of education: Not on file  . Highest education level: Not on file  Occupational History  . Occupation: accountant/ Retired  Scientific laboratory technician  . Financial resource strain: Not hard at all  . Food insecurity    Worry: Never true    Inability: Never true  . Transportation needs    Medical: No    Non-medical: No  Tobacco Use  . Smoking status: Former Smoker    Quit date: 01/12/1967  Years since quitting: 51.7  . Smokeless tobacco: Never Used  Substance and Sexual Activity  . Alcohol use: Yes    Alcohol/week: 28.0 standard drinks    Types: 28 Standard drinks or equivalent per week    Comment: .5 bottle of [place] daily  . Drug use: No  . Sexual activity: Yes  Lifestyle  . Physical activity    Days per week: 4 days    Minutes per session: 40 min  . Stress: To some extent  Relationships  . Social connections    Talks on phone: More than three times a week    Gets together: More than three times a week    Attends religious service: More than 4 times per year    Active member of club or organization: Yes    Attends meetings of clubs or organizations: More than 4 times per year    Relationship status: Married  Other Topics  Concern  . Not on file  Social History Narrative   HSG, [place]. 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 Has weathered the recession OK with some adjustments. Advanced Care planning - patient does not want cardiac resuscitation - provided a signed "out of facility" order for home display (Aug '12) and did review the MOST form, providing an unsigned copy to be used for home discussion with report back.           Outpatient Encounter Medications as of 10/06/2018  Medication Sig  . apixaban (ELIQUIS) 2.5 MG TABS tablet Take 1 tablet (2.5 mg total) by mouth 2 (two) times daily.  [name] atorvastatin (LIPITOR) 40 MG tablet Take 1 tablet by mouth once daily  . B Complex-C (SUPER B COMPLEX PO) Take 1 tablet by mouth daily.   . Calcium Carbonate (CALCIUM 600) 1500 MG TABS Take 1 tablet by mouth daily.   . Ginger, Zingiber officinalis, (GINGER PO) See admin instructions. Chew a small portion of ginger root as needed for indigestion  . hydrochlorothiazide (HYDRODIURIL) 25 MG tablet Take 1 tablet by mouth once daily  . latanoprost (XALATAN) 0.005 % ophthalmic solution   . metoprolol tartrate (LOPRESSOR) 25 MG tablet TAKE ONE TABLET BY MOUTH AS NEEDED. ONLY USES IF NEEDED FOR TACHYCARDIA. HAS NOT NEEDED IN OVER A YEAR  . Multiple Vitamin (MULTI-VITAMIN) tablet Take by mouth.  . neomycin-polymyxin-hydrocortisone (CORTISPORIN) OTIC solution Place 3 drops into both ears 4 (four) times daily.  . nitroGLYCERIN (NITROSTAT) 0.4 MG SL tablet Place 1 tablet (0.4 mg total) under the tongue every 5 (five) minutes as needed for chest pain.  . tamsulosin (FLOMAX) 0.4 MG CAPS capsule Take 1 capsule (0.4 mg total) by mouth daily.  . timolol (BETIMOL) 0.5 % ophthalmic solution Place 1 drop into both eyes 2 (two) times daily.   . timolol (TIMOPTIC) 0.5 % ophthalmic solution  INSTILL 1 DROP INTO EACH EYE TWICE DAILY   No facility-administered encounter medications on file as of 10/06/2018.     Activities of Daily Living In your present state of health, do you have any difficulty performing the following activities: 10/06/2018  Hearing? Y  Vision? N  Difficulty concentrating or making decisions? N  Walking or climbing stairs? N  Dressing or bathing? N  Doing errands, shopping? N  Preparing Food and eating ? N  Using the Toilet? N  In the past six months, have you accidently  leaked urine? Y  Do you have problems with loss of bowel control? N  Managing your Medications? N  Managing your Finances? N  Housekeeping or managing your Housekeeping? N  Some recent data might be hidden    Patient Care Team: Hoyt Koch, MD as PCP - General (Internal Medicine) Bensimhon, Shaune Pascal, MD (Cardiology) Shon Hough, MD (Ophthalmology) Alda Berthold, DO as Consulting Physician (Neurology)   Assessment:   This is a routine wellness examination for [place]. Physical assessment deferred to PCP.  Exercise Activities and Dietary recommendations Current Exercise Habits: Home exercise routine, Type of exercise: walking;strength training/weights(PT and balance exercises), Time (Minutes): 35, Frequency (Times/Week): 4, Weekly Exercise (Minutes/Week): 140, Intensity: Mild, Exercise limited by: orthopedic condition(s) Diet (meal preparation, eat out, water intake, caffeinated beverages, dairy products, fruits and vegetables): in general, a "healthy" diet  , well balanced   Reviewed heart healthy diet. Encouraged patient to increase daily water and healthy fluid intake.   Goals    . Patient Stated     Continue to be physically and socially active.       Fall Risk Fall Risk  10/06/2018 08/03/2017 07/19/2016 05/11/2015 04/01/2015  Falls in the past year? 0 Yes Yes No No  Number falls in past yr: 0 1 1 - -  Injury with Fall? 0 Yes Yes - -  Comment - - back injury -  -  Risk for fall due to : Impaired balance/gait Impaired balance/gait;Impaired mobility - - -  Follow up - Falls prevention discussed;Education provided - - -   Is the patient's home free of loose throw rugs in walkways, pet beds, electrical cords, etc?   yes      Grab bars in the bathroom? yes      Handrails on the stairs?   yes      Adequate lighting?   yes  Depression Screen PHQ 2/9 Scores 10/06/2018 08/03/2017 07/19/2016 05/11/2015  PHQ - 2 Score 1 1 0 0  PHQ- 9 Score 3 3 - -    Cognitive Function MMSE - Mini Mental State Exam 08/03/2017  Orientation to time 5  Orientation to Place 5  Registration 3  Attention/ Calculation 5  Recall 2  Language- name 2 objects 2  Language- repeat 1  Language- follow 3 step command 3  Language- read & follow direction 1  Write a sentence 1  Copy design 1  Total score 29     6CIT Screen 10/06/2018  What Year? 0 points  What month? 0 points  What time? 0 points  Count back from 20 0 points  Months in reverse 0 points  Repeat phrase 2 points  Total Score 2    Immunization History  Administered Date(s) Administered  . H1N1 12/25/2007  . Influenza Split 09/12/2011  . Influenza Whole 10/23/2008, 12/13/2013  . Influenza-Unspecified 11/11/2012, 10/26/2014  . Pneumococcal Conjugate-13 10/23/2012  . Pneumococcal Polysaccharide-23 05/13/2003  . Tdap 08/19/2010   Screening Tests Health Maintenance  Topic Date Due  . INFLUENZA VACCINE  08/12/2018  . TETANUS/TDAP  08/18/2020  . PNA vac Low Risk Adult  Completed        Plan:    Reviewed health maintenance screenings with patient today and relevant education, vaccines, and/or referrals were provided.   I have personally reviewed and noted the following in the patient's chart:   . Medical and social history . Use of alcohol, tobacco or illicit drugs  . Current medications and supplements . Functional ability and status .  Nutritional status . Physical activity . Advanced directives  . List of other physicians . Vitals . Screenings to include cognitive, depression, and falls . Referrals and appointments  In addition, I have reviewed and discussed with patient certain preventive protocols, quality metrics, and best practice recommendations. A written personalized care plan for preventive services as well as general preventive health recommendations were provided to patient.     Michiel Cowboy, RN  10/06/2018  Medical screening examination/treatment/procedure(s) were performed by non-physician practitioner and as supervising physician I was immediately available for consultation/collaboration. I agree with above. Cathlean Cower, MD

## 2018-10-06 ENCOUNTER — Ambulatory Visit (INDEPENDENT_AMBULATORY_CARE_PROVIDER_SITE_OTHER): Payer: Medicare Other | Admitting: *Deleted

## 2018-10-06 ENCOUNTER — Other Ambulatory Visit: Payer: Self-pay

## 2018-10-06 VITALS — BP 126/62 | HR 72 | Resp 19 | Ht 71.0 in | Wt 151.0 lb

## 2018-10-06 DIAGNOSIS — Z0000 Encounter for general adult medical examination without abnormal findings: Secondary | ICD-10-CM

## 2018-10-06 NOTE — Patient Instructions (Signed)
Continue doing brain stimulating activities (puzzles, reading, adult coloring books, staying active) to keep memory sharp.   Continue to eat heart healthy diet (full of fruits, vegetables, whole grains, lean protein, water--limit salt, fat, and sugar intake) and increase physical activity as tolerated.   Larry Lopez , Thank you for taking time to come for your Medicare Wellness Visit. I appreciate your ongoing commitment to your health goals. Please review the following plan we discussed and let me know if I can assist you in the future.   These are the goals we discussed: Goals    . Patient Stated     Continue to be physically and socially active.       This is a list of the screening recommended for you and due dates:  Health Maintenance  Topic Date Due  . Flu Shot  08/12/2018  . Tetanus Vaccine  08/18/2020  . Pneumonia vaccines  Completed    Preventive Care 9 Years and Older, Male Preventive care refers to lifestyle choices and visits with your health care provider that can promote health and wellness. This includes:  A yearly physical exam. This is also called an annual well check.  Regular dental and eye exams.  Immunizations.  Screening for certain conditions.  Healthy lifestyle choices, such as diet and exercise. What can I expect for my preventive care visit? Physical exam Your health care provider will check:  Height and weight. These may be used to calculate body mass index (BMI), which is a measurement that tells if you are at a healthy weight.  Heart rate and blood pressure.  Your skin for abnormal spots. Counseling Your health care provider may ask you questions about:  Alcohol, tobacco, and drug use.  Emotional well-being.  Home and relationship well-being.  Sexual activity.  Eating habits.  History of falls.  Memory and ability to understand (cognition).  Work and work Statistician. What immunizations do I need?  Influenza (flu) vaccine   This is recommended every year. Tetanus, diphtheria, and pertussis (Tdap) vaccine  You may need a Td booster every 10 years. Varicella (chickenpox) vaccine  You may need this vaccine if you have not already been vaccinated. Zoster (shingles) vaccine  You may need this after age 69. Pneumococcal conjugate (PCV13) vaccine  One dose is recommended after age 32. Pneumococcal polysaccharide (PPSV23) vaccine  One dose is recommended after age 62. Measles, mumps, and rubella (MMR) vaccine  You may need at least one dose of MMR if you were born in 1957 or later. You may also need a second dose. Meningococcal conjugate (MenACWY) vaccine  You may need this if you have certain conditions. Hepatitis A vaccine  You may need this if you have certain conditions or if you travel or work in places where you may be exposed to hepatitis A. Hepatitis B vaccine  You may need this if you have certain conditions or if you travel or work in places where you may be exposed to hepatitis B. Haemophilus influenzae type b (Hib) vaccine  You may need this if you have certain conditions. You may receive vaccines as individual doses or as more than one vaccine together in one shot (combination vaccines). Talk with your health care provider about the risks and benefits of combination vaccines. What tests do I need? Blood tests  Lipid and cholesterol levels. These may be checked every 5 years, or more frequently depending on your overall health.  Hepatitis C test.  Hepatitis B test. Screening  Lung  cancer screening. You may have this screening every year starting at age 82 if you have a 30-pack-year history of smoking and currently smoke or have quit within the past 15 years.  Colorectal cancer screening. All adults should have this screening starting at age 93 and continuing until age 73. Your health care provider may recommend screening at age 25 if you are at increased risk. You will have tests every  1-10 years, depending on your results and the type of screening test.  Prostate cancer screening. Recommendations will vary depending on your family history and other risks.  Diabetes screening. This is done by checking your blood sugar (glucose) after you have not eaten for a while (fasting). You may have this done every 1-3 years.  Abdominal aortic aneurysm (AAA) screening. You may need this if you are a current or former smoker.  Sexually transmitted disease (STD) testing. Follow these instructions at home: Eating and drinking  Eat a diet that includes fresh fruits and vegetables, whole grains, lean protein, and low-fat dairy products. Limit your intake of foods with high amounts of sugar, saturated fats, and salt.  Take vitamin and mineral supplements as recommended by your health care provider.  Do not drink alcohol if your health care provider tells you not to drink.  If you drink alcohol: ? Limit how much you have to 0-2 drinks a day. ? Be aware of how much alcohol is in your drink. In the [place] one drink equals one 12 oz bottle of beer (355 mL), one 5 oz glass of  (148 mL), or one 1 oz glass of hard liquor (44 mL). Lifestyle  Take daily care of your teeth and gums.  Stay active. Exercise for at least 30 minutes on 5 or more days each week.  Do not use any products that contain nicotine or tobacco, such as cigarettes, e-cigarettes, and chewing tobacco. If you need help quitting, ask your health care provider.  If you are sexually active, practice safe sex. Use a condom or other form of protection to prevent STIs (sexually transmitted infections).  Talk with your health care provider about taking a low-dose aspirin or statin. What's next?  Visit your health care provider once a year for a well check visit.  Ask your health care provider how often you should have your eyes and teeth checked.  Stay up to date on all vaccines. This information is not intended to replace  advice given to you by your health care provider. Make sure you discuss any questions you have with your health care provider. Document Released: 01/24/2015 Document Revised: 12/22/2017 Document Reviewed: 12/22/2017 Elsevier Patient Education  2020 [employer].

## 2018-10-12 ENCOUNTER — Other Ambulatory Visit (HOSPITAL_COMMUNITY): Payer: Self-pay | Admitting: Internal Medicine

## 2018-10-13 IMAGING — DX DG RIBS W/ CHEST 3+V*R*
4 series · 4 of 4 positions shown · non-contrast
Comparison: Chest x-ray 04/01/2015

CLINICAL DATA: Right posterior chest wall pain after fall last
night.

EXAM:
RIGHT RIBS AND CHEST - 3+ VIEW

[chest pa]
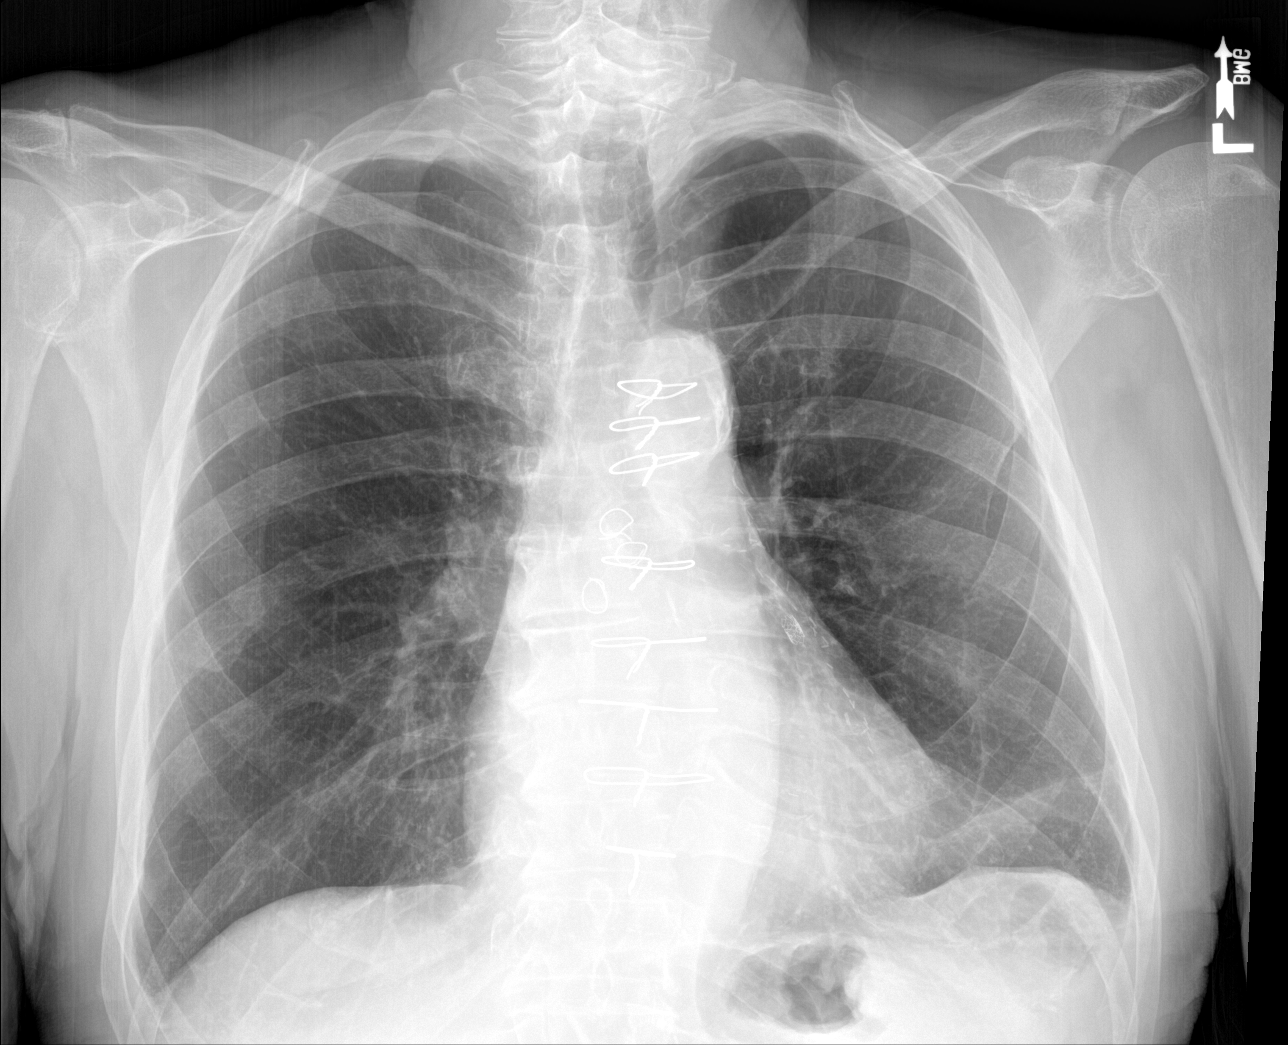

[rib obl (1 of 2)]
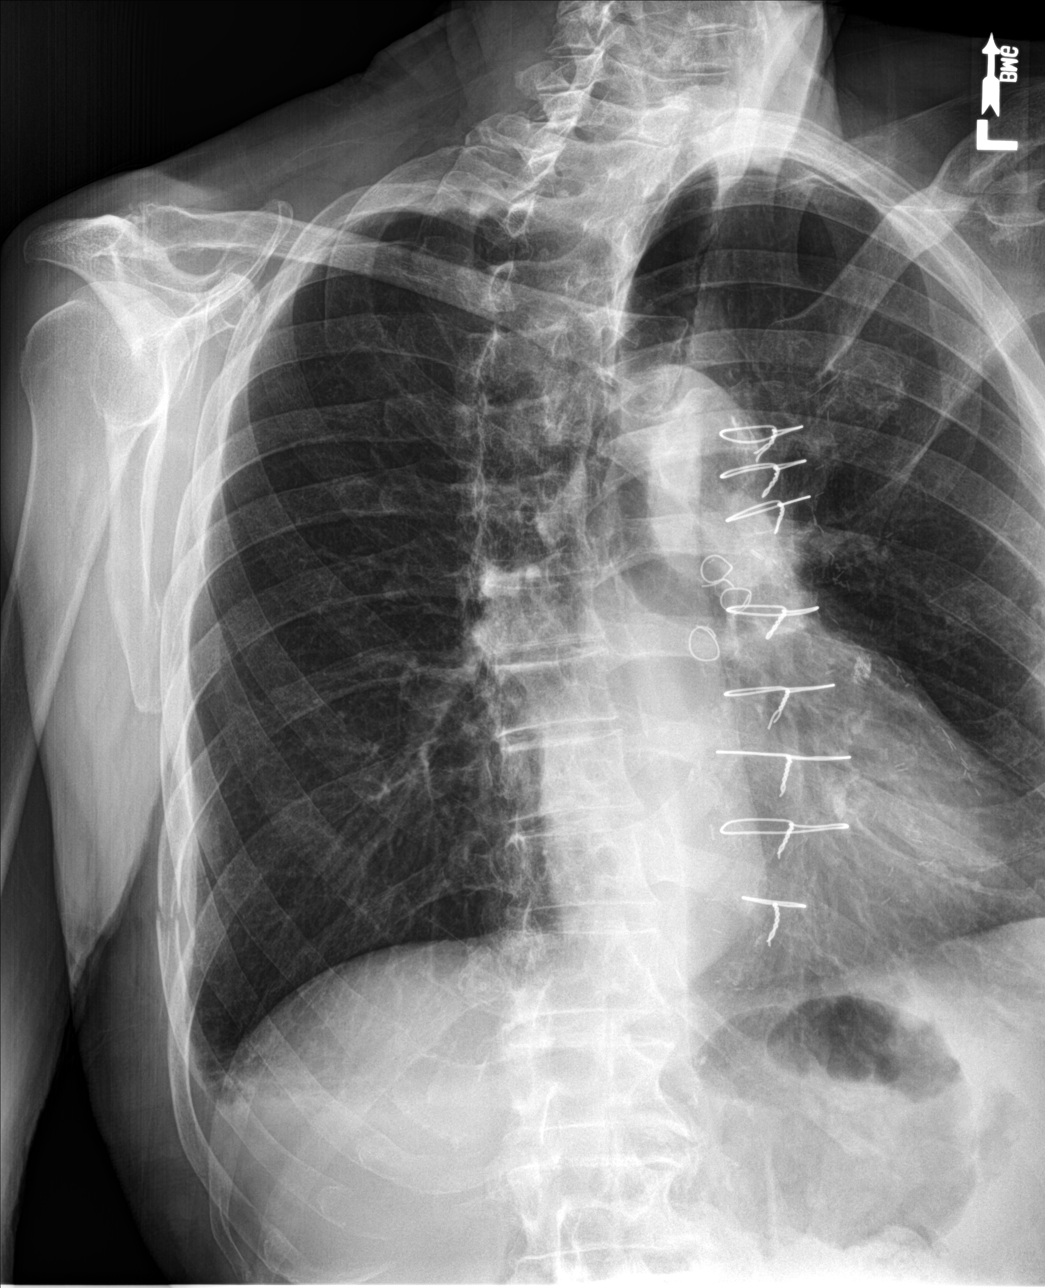

[rib obl (2 of 2)]
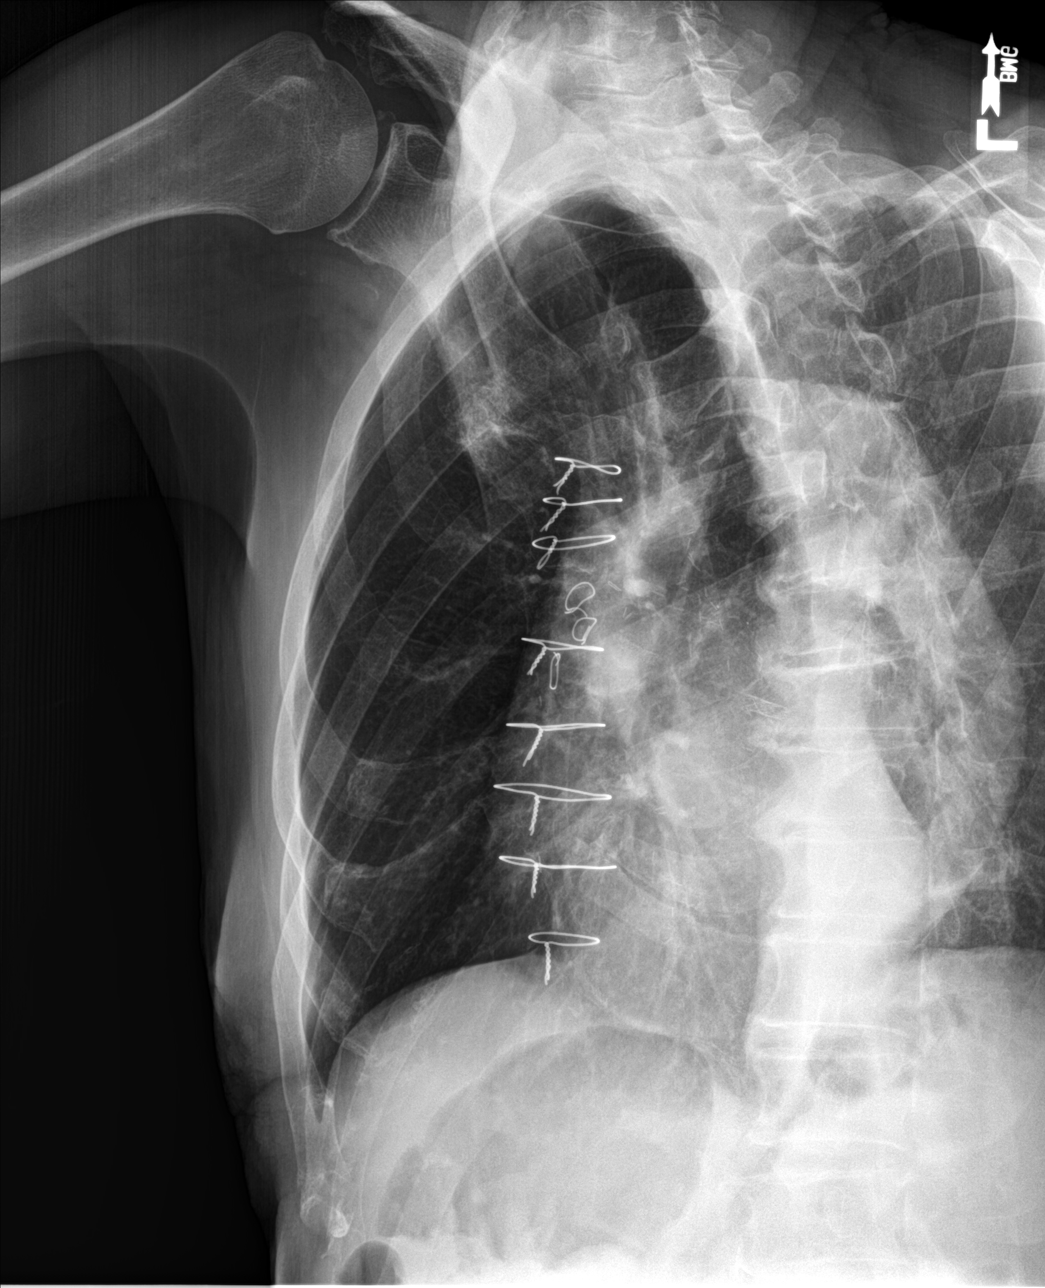

[chest ap]
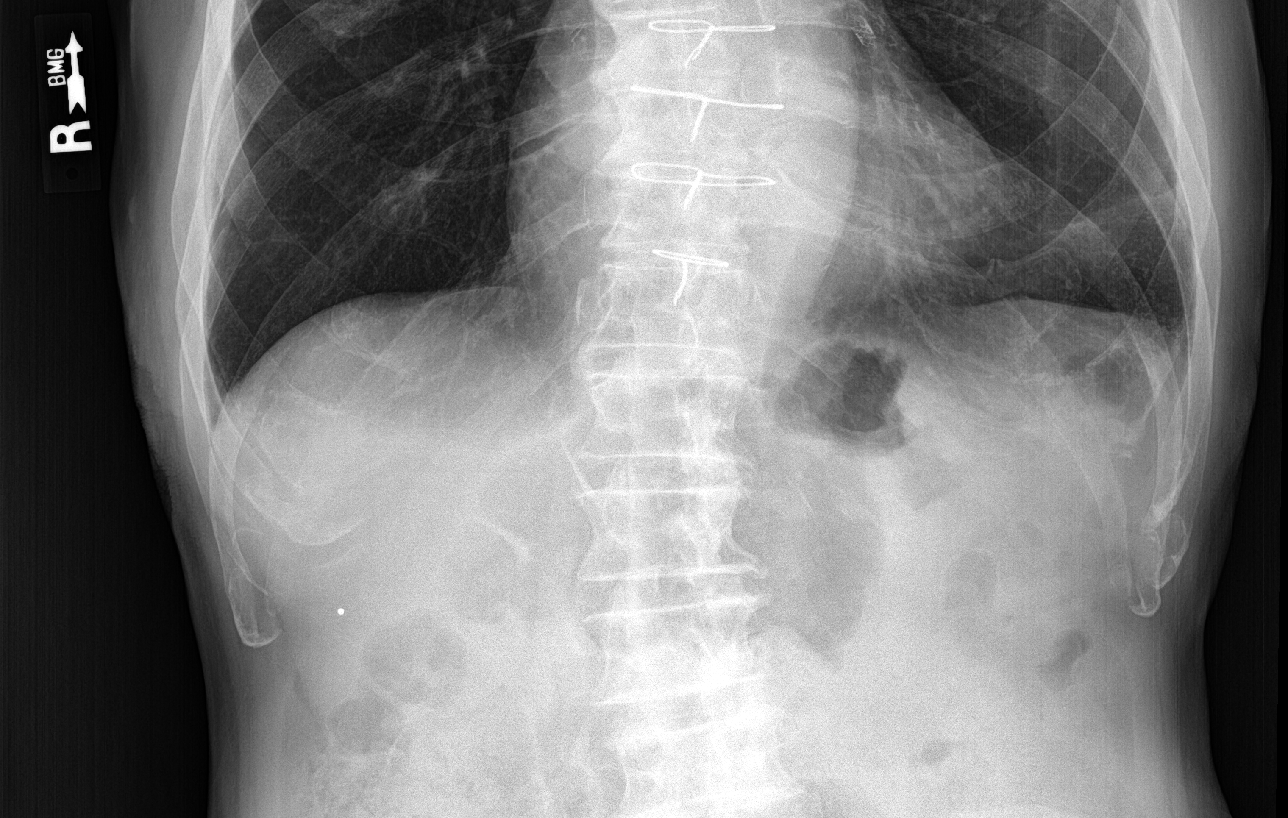

[4 of 4 positions shown; findings below may reference images not displayed]

FINDINGS: The lungs are clear without focal pneumonia, edema, pneumothorax or
pleural effusion. Hyperexpansion is stable. Scarring at the left
base is unchanged.

Oblique views of the right ribs show a fracture of the lateral right
ninth rib. Fracture line is indistinct with some adjacent lucency.
Oblique film suggests that there may be associated nondisplaced
eighth rib fracture.
IMPRESSION: Fracture of the right lateral ninth rib has poorly defined margins
and a suggestion of bony erosion. This may be due to subacute
injury. No soft tissue nodule or gross bony destruction to suggest
pathologic fracture. There is probably an associated nondisplaced
right eighth rib fracture. Consider follow-up films to ensure
appropriate healing.

## 2018-10-17 ENCOUNTER — Other Ambulatory Visit (HOSPITAL_COMMUNITY): Payer: Self-pay | Admitting: Internal Medicine

## 2018-10-19 ENCOUNTER — Other Ambulatory Visit (HOSPITAL_COMMUNITY): Payer: Self-pay | Admitting: Internal Medicine

## 2018-10-22 IMAGING — CR DG CHEST 2V
2 series · 2 of 2 positions shown · non-contrast
Comparison: Chest x-ray and right rib radiographs 07/19/2016

CLINICAL DATA: Fall 10 days ago. Right lateral posterior rib injury
with contusion. Right rib fracture.

EXAM:
CHEST  2 VIEW

[w chest pa]
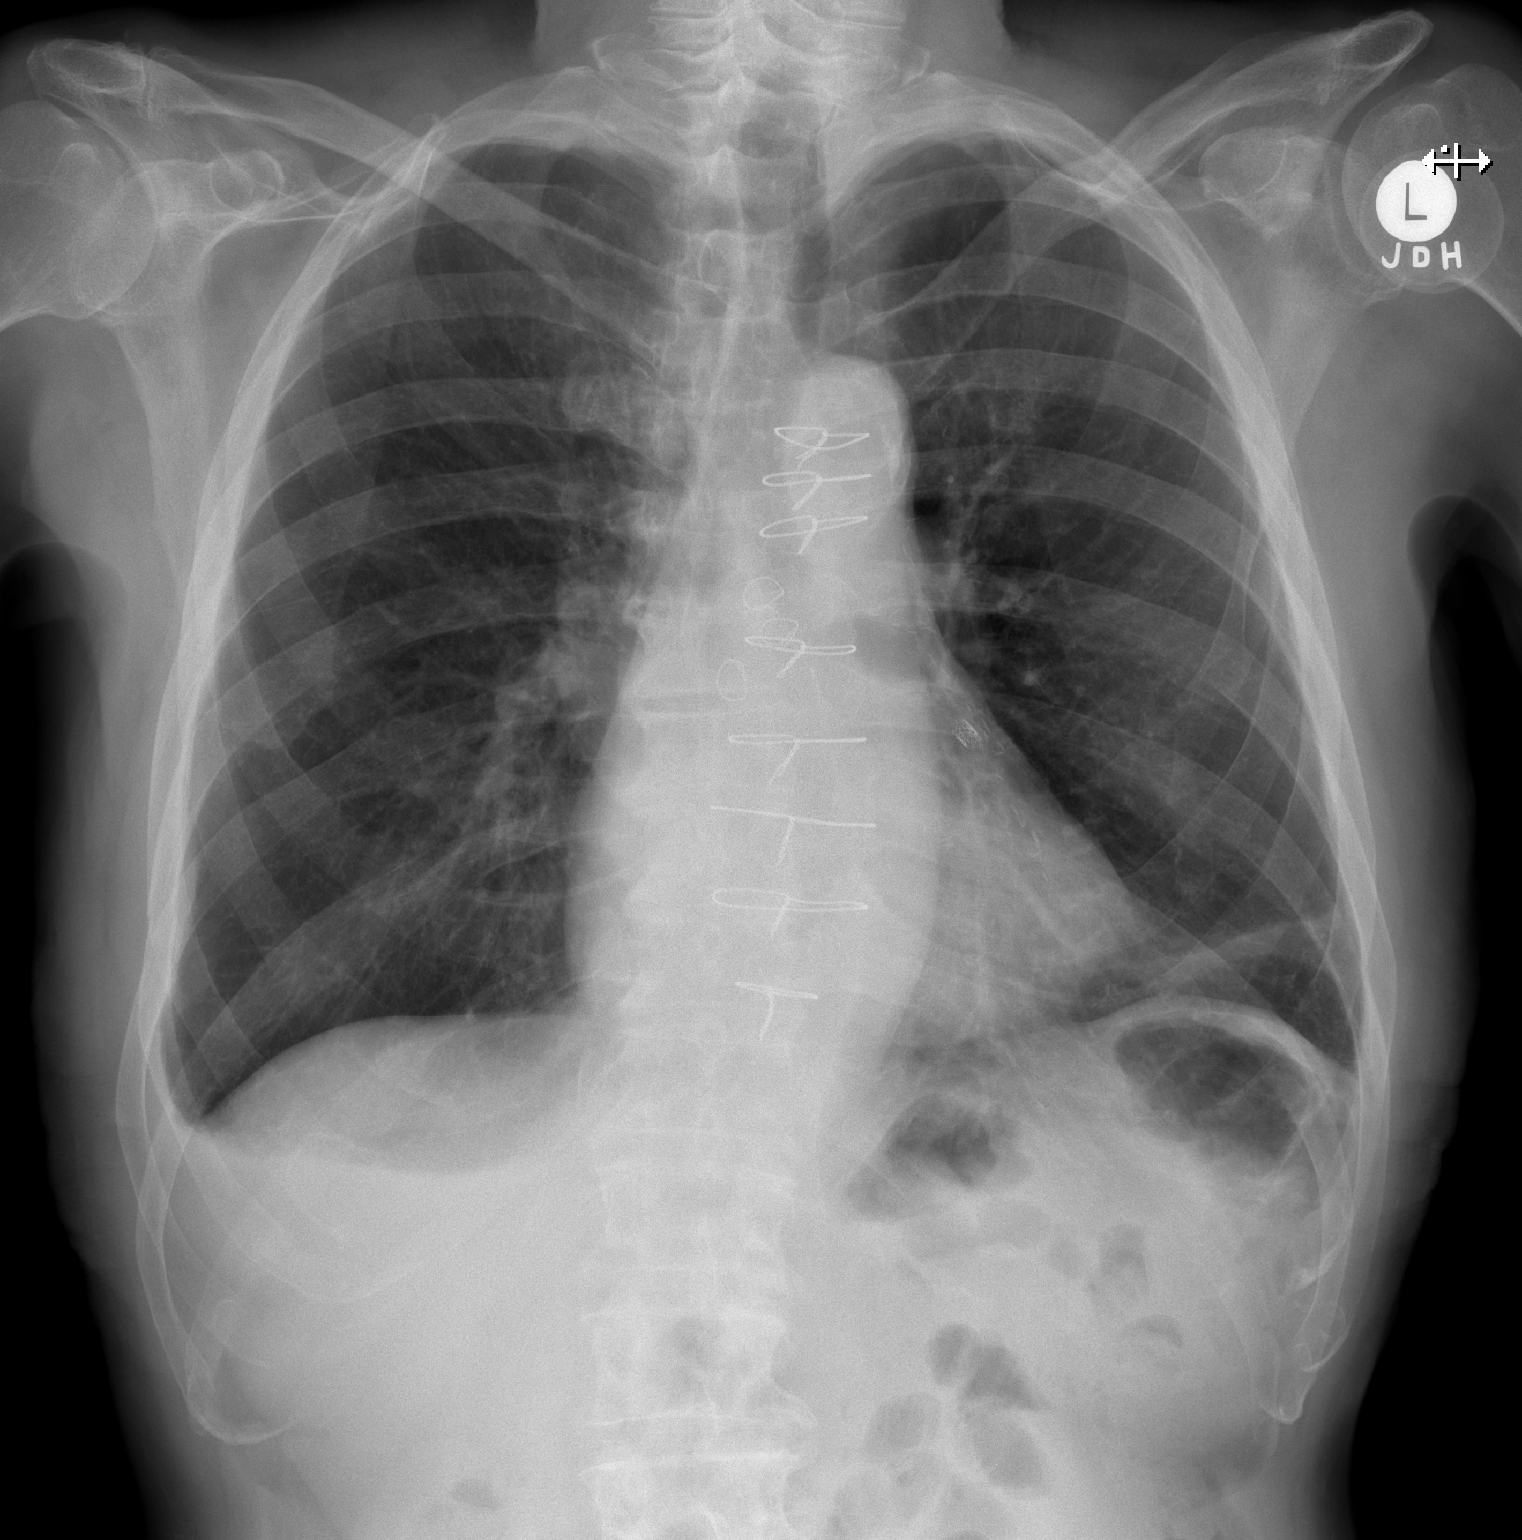

[w chest lat]
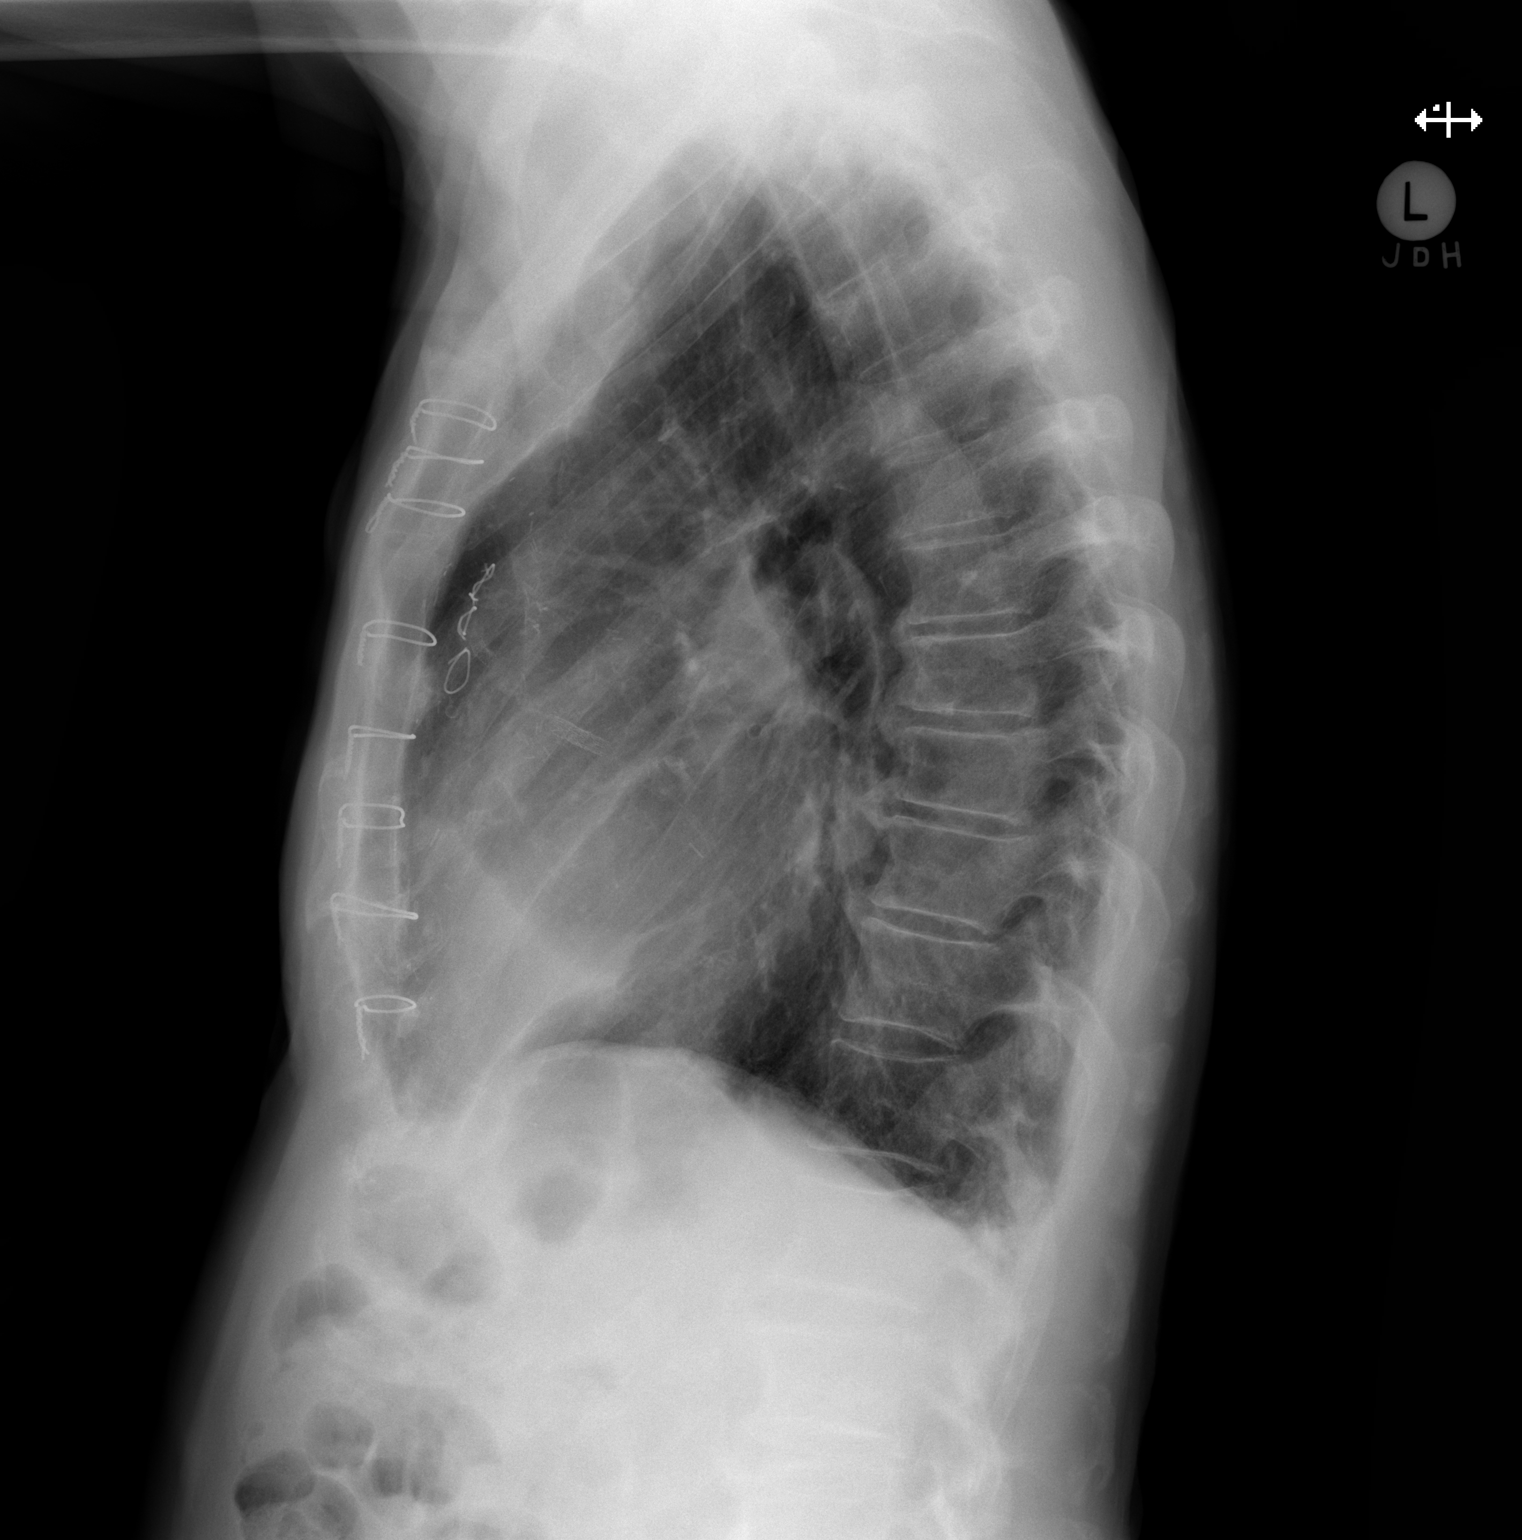

[2 of 2 positions shown; findings below may reference images not displayed]

FINDINGS: The heart size is normal. The minimally displaced right ninth rib
fracture is again seen. Atelectasis or scarring at the left base is
stable. A developing right pleural effusion is present. Minimal
bibasilar airspace disease likely reflects atelectasis. There is no
pneumothorax. No other significant airspace disease is present.
Patient is status post median sternotomy for CABG. Coronary stents
are in place. Degenerative changes of the thoracic spine are stable.
IMPRESSION: 1. Developing right pleural effusion. This may reflect blood or be
reactive.
2. Minimally displaced right ninth rib fracture is unchanged.
3. Stable scarring or atelectasis at the left base.

## 2018-10-23 ENCOUNTER — Other Ambulatory Visit (HOSPITAL_COMMUNITY): Payer: Self-pay | Admitting: Internal Medicine

## 2018-10-30 ENCOUNTER — Telehealth: Payer: Self-pay

## 2018-10-30 DIAGNOSIS — H9190 Unspecified hearing loss, unspecified ear: Secondary | ICD-10-CM

## 2018-10-30 DIAGNOSIS — H6123 Impacted cerumen, bilateral: Secondary | ICD-10-CM | POA: Diagnosis not present

## 2018-10-30 NOTE — Telephone Encounter (Signed)
Copied from [place] [phone]. Topic: General - Other >> Oct 30, 2018  8:32 AM Celene Kras A wrote: Reason for CRM: Pt called stating he is needing a referral placed for him to see a hearing specialist as his moved to [place]. Please advise.

## 2018-10-30 NOTE — Telephone Encounter (Signed)
Referral placed.

## 2018-10-30 NOTE — Telephone Encounter (Signed)
Patient informed that referral was sent

## 2018-10-30 NOTE — Addendum Note (Signed)
Addended by: Pricilla Holm A on: 10/30/2018 03:03 PM   Modules accepted: Orders

## 2018-11-01 ENCOUNTER — Other Ambulatory Visit (HOSPITAL_COMMUNITY): Payer: Self-pay | Admitting: Internal Medicine

## 2018-11-27 DIAGNOSIS — H6123 Impacted cerumen, bilateral: Secondary | ICD-10-CM | POA: Insufficient documentation

## 2018-11-27 DIAGNOSIS — H9193 Unspecified hearing loss, bilateral: Secondary | ICD-10-CM | POA: Diagnosis not present

## 2018-11-27 DIAGNOSIS — Z87891 Personal history of nicotine dependence: Secondary | ICD-10-CM | POA: Diagnosis not present

## 2018-11-27 DIAGNOSIS — Z7289 Other problems related to lifestyle: Secondary | ICD-10-CM | POA: Diagnosis not present

## 2018-11-27 DIAGNOSIS — Z974 Presence of external hearing-aid: Secondary | ICD-10-CM | POA: Diagnosis not present

## 2018-11-29 DIAGNOSIS — D485 Neoplasm of uncertain behavior of skin: Secondary | ICD-10-CM | POA: Diagnosis not present

## 2018-11-29 DIAGNOSIS — D044 Carcinoma in situ of skin of scalp and neck: Secondary | ICD-10-CM | POA: Diagnosis not present

## 2018-11-29 DIAGNOSIS — L821 Other seborrheic keratosis: Secondary | ICD-10-CM | POA: Diagnosis not present

## 2018-11-29 DIAGNOSIS — Z85828 Personal history of other malignant neoplasm of skin: Secondary | ICD-10-CM | POA: Diagnosis not present

## 2018-11-29 DIAGNOSIS — L820 Inflamed seborrheic keratosis: Secondary | ICD-10-CM | POA: Diagnosis not present

## 2018-11-29 DIAGNOSIS — D164 Benign neoplasm of bones of skull and face: Secondary | ICD-10-CM | POA: Diagnosis not present

## 2018-11-29 DIAGNOSIS — C4442 Squamous cell carcinoma of skin of scalp and neck: Secondary | ICD-10-CM | POA: Diagnosis not present

## 2018-11-29 DIAGNOSIS — L570 Actinic keratosis: Secondary | ICD-10-CM | POA: Diagnosis not present

## 2018-12-05 ENCOUNTER — Telehealth (HOSPITAL_COMMUNITY): Payer: Self-pay

## 2018-12-05 NOTE — Telephone Encounter (Signed)
COVID-19 pre-appointment screening questions:   Do you have a history of COVID-19 or a positive test result in the past 7-10 days? NO  To the best of your knowledge, have you been in close contact with anyone with a confirmed diagnosis of COVID 19? NO  Have you had any one or more of the following: Fever, chills, cough, shortness of breath (out of the normal for you) or any flu-like symptoms? NO  Are you experiencing any of the following symptoms that is new or out of usual for you: NO  . Ear, nose or throat discomfort . Sore throat . Headache . Muscle Pain . Diarrhea . Loss of taste or smell   Reviewed all the following with patient: . Use of hand sanitizer when entering the building . Everyone is required to wear a mask in the building, if you do not have a mask we are happy to provide you with one when you arrive . NO Visitor guidelines   If patient answers YES to any of questions they must change to a virtual visit and place note in comments about symptoms   QUESTIONS WERE ANSWERED BY HIS WIFE

## 2018-12-06 ENCOUNTER — Other Ambulatory Visit: Payer: Self-pay

## 2018-12-06 ENCOUNTER — Encounter (HOSPITAL_COMMUNITY): Payer: Self-pay | Admitting: Internal Medicine

## 2018-12-06 ENCOUNTER — Ambulatory Visit (HOSPITAL_COMMUNITY)
Admission: RE | Admit: 2018-12-06 | Discharge: 2018-12-06 | Disposition: A | Discharge status: Home / Self Care | Payer: Medicare Other | Source: Ambulatory Visit | Attending: Internal Medicine | Admitting: Internal Medicine

## 2018-12-06 VITALS — BP 122/60 | HR 80 | Wt 150.6 lb

## 2018-12-06 DIAGNOSIS — I2510 Atherosclerotic heart disease of native coronary artery without angina pectoris: Secondary | ICD-10-CM | POA: Diagnosis not present

## 2018-12-06 DIAGNOSIS — E785 Hyperlipidemia, unspecified: Secondary | ICD-10-CM | POA: Insufficient documentation

## 2018-12-06 DIAGNOSIS — I482 Chronic atrial fibrillation, unspecified: Secondary | ICD-10-CM | POA: Diagnosis not present

## 2018-12-06 DIAGNOSIS — Z951 Presence of aortocoronary bypass graft: Secondary | ICD-10-CM | POA: Insufficient documentation

## 2018-12-06 DIAGNOSIS — H409 Unspecified glaucoma: Secondary | ICD-10-CM | POA: Diagnosis not present

## 2018-12-06 DIAGNOSIS — Z79899 Other long term (current) drug therapy: Secondary | ICD-10-CM | POA: Insufficient documentation

## 2018-12-06 DIAGNOSIS — I10 Essential (primary) hypertension: Secondary | ICD-10-CM | POA: Diagnosis not present

## 2018-12-06 DIAGNOSIS — G629 Polyneuropathy, unspecified: Secondary | ICD-10-CM | POA: Diagnosis not present

## 2018-12-06 DIAGNOSIS — I480 Paroxysmal atrial fibrillation: Secondary | ICD-10-CM | POA: Diagnosis not present

## 2018-12-06 DIAGNOSIS — I4821 Permanent atrial fibrillation: Secondary | ICD-10-CM | POA: Diagnosis not present

## 2018-12-06 DIAGNOSIS — Z7901 Long term (current) use of anticoagulants: Secondary | ICD-10-CM | POA: Diagnosis not present

## 2018-12-06 NOTE — Progress Notes (Signed)
Medication Samples have been provided to the patient.  Drug name: Eliquis       Strength: 2.5 mg        Qty: 5  LOT: [id number]  Exp.Date: 12/21  Dosing instructions: Take 1 tab Twice daily   The patient has been instructed regarding the correct time, dose, and frequency of taking this medication, including desired effects and most common side effects.   Beva Remund 9:24 AM 12/06/2018

## 2018-12-06 NOTE — Patient Instructions (Signed)
We will contact you in 6 months to schedule your next appointment.  

## 2018-12-06 NOTE — Addendum Note (Signed)
Encounter addended by: Scarlette Calico, RN on: 12/06/2018 9:59 AM  Actions taken: Clinical Note Signed

## 2018-12-06 NOTE — Progress Notes (Signed)
CARDIOLOGY CLINIC NOTE  Patient ID: Larry Lopez, male   DOB: 1926-07-30, 85 y.o.   MRN: [id number]  HPI:  Larry Lopez ('[name]") is a 85 y.o. male  with a history of a coronary artery disease status post redo bypass surgery in 2002 by Dr. Cyndia Bent.[name] He also has a history of hypertension and hyperlipidemia, PAF complicated by  tachy-brady syndrome with significant resting bradycardia prohibiting AV-Nodal blockade. Was hospitalized in 6/10 with rapid AF.  He saw Dr. Caryl Comes previously for consideration of pacemaker but it was thought that as AF is infrequent and self-limiting and his bradycardia is asymptomatic  that we would watch it for now and avoid AV-nodal blockers due to bradycardia. If AF burden increased would then consider pacing and re-insitutuion of AV-nodal blockers.  On 02/06/13 underwent DC-CV for recurrent AF but quickly reverted back to AF.   He had Lexiscan Myoview 08/08/13 EF 56% Echo read as 40-45% (Dr. Haroldine Laws reviewed personally and thought EF closer to 50-55%.  Had fall in 7/18 with splenic laceration Eliquis stopped for several weeks.   Here for routine f/u. Here with his wife. Feels pretty good but notices he is slowing dow. Has pain in his hips and neuropathy in his feet. No CP or SOB. Remains in AF. No bleeding with Eliquis. Still drinks about 1/2 bottle of wine per day. No edema, orthopnea or PND.    Lab Results  Component Value Date   CHOL 122 09/05/2017   HDL 49.00 09/05/2017   LDLCALC 55 09/05/2017   TRIG 93.0 09/05/2017   CHOLHDL 2 09/05/2017    ROS: All systems negative except as listed in HPI, PMH and Problem List.  Past Medical History:  Diagnosis Date  . Atrial fibrillation ([place])   . Bradycardia   . CAD (coronary artery disease)   . Glaucoma   . HLD (hyperlipidemia)   . HTN (hypertension)   . Tachy-brady syndrome ([place])     Current Outpatient Medications  Medication Sig Dispense Refill  . atorvastatin (LIPITOR) 40 MG tablet Take 1 tablet by  mouth once daily 90 tablet 0  . B Complex-C (SUPER B COMPLEX PO) Take 1 tablet by mouth daily.     . Calcium Carbonate (CALCIUM 600) 1500 MG TABS Take 1 tablet by mouth daily.     [name] ELIQUIS 2.5 MG TABS tablet Take 1 tablet by mouth twice daily 60 tablet 0  . Ginger, Zingiber officinalis, (GINGER PO) See admin instructions. Chew a small portion of ginger root as needed for indigestion    . hydrochlorothiazide (HYDRODIURIL) 25 MG tablet Take 1 tablet by mouth once daily 90 tablet 0  . latanoprost (XALATAN) 0.005 % ophthalmic solution     . metoprolol tartrate (LOPRESSOR) 25 MG tablet TAKE 1 TABLET BY MOUTH ONCE DAILY AS NEEDED. ONLY  USES  IF  NEEDED  FOR  TACHYCARDIA.  HAS  NOT  NEEDED  IN  OVER  A  YEAR 90 tablet 0  . Multiple Vitamin (MULTI-VITAMIN) tablet Take by mouth.    . nitroGLYCERIN (NITROSTAT) 0.4 MG SL tablet Place 1 tablet (0.4 mg total) under the tongue every 5 (five) minutes as needed for chest pain. 25 tablet 6  . tamsulosin (FLOMAX) 0.4 MG CAPS capsule Take 1 capsule (0.4 mg total) by mouth daily. 90 capsule 3  . timolol (BETIMOL) 0.5 % ophthalmic solution Place 1 drop into both eyes daily.     . timolol (TIMOPTIC) 0.5 % ophthalmic solution INSTILL 1 DROP INTO  EACH EYE TWICE DAILY     No current facility-administered medications for this encounter.     PHYSICAL EXAM: Vitals:   12/06/18 0929  BP: 122/60  Pulse: 80  SpO2: 99%  Weight: 68.3 kg (150 lb 9.6 oz)   Wt Readings from Last 3 Encounters:  12/06/18 68.3 kg (150 lb 9.6 oz)  10/06/18 68.5 kg (151 lb)  08/07/18 68.1 kg (150 lb 3.2 oz)    General:  Well appearing. Elderly. No resp difficulty HEENT: normal Neck: supple. no JVD. Carotids 2+ bilat; no bruits. No lymphadenopathy or thryomegaly appreciated. Cor: PMI nondisplaced. Irregular rate & rhythm. No rubs, gallops or murmurs. Lungs: clear Abdomen: soft, nontender, nondistended. No hepatosplenomegaly. No bruits or masses. Good bowel sounds. Extremities: no  cyanosis, clubbing, rash, edema Neuro: alert & orientedx3, cranial nerves grossly intact. moves all 4 extremities w/o difficulty. Affect pleasant  ECG: AF 70 Personally reviewed   ASSESSMENT & PLAN:  1. Chronic AF - rate controlled. Well tolerated. No bleeding on Eliquis 2.5 bid  2. HTN  - Blood pressure well controlled. Continue current regimen. 3. H/o tachy brady syndrome stable - no indication for PPM 4. CAD s/p re-do CABG 2002 - no s/s ischemia - continue statin - off ASA with Eliquis 5. Hyperlipidemia  - followed by PCP - Goal LDL < 70. Continue statin  6. Neuropathy - Followed by Neurology  - No change  Glori Bickers, MD 9:46 AM

## 2019-01-15 ENCOUNTER — Telehealth: Payer: Self-pay

## 2019-01-15 NOTE — Telephone Encounter (Signed)
Copied from [place] [id number]. Topic: Quick Communication - See Telephone Encounter >> Jan 15, 2019  8:55 AM Loma [place] wrote: CRM for notification. See Telephone encounter for: 01/15/19. This pt states has been a pt of [facility] over 40 years and is wanting Dr C to make an exception and accept his daughter Tillie Rung [place] as pt. Informed not taking new pt but pt wanted to message dr C for an exception, told that Dr C would reach out if was a possibility [phone]

## 2019-01-15 NOTE — Telephone Encounter (Signed)
Fine to accept, no more than 1 new patient per day

## 2019-01-15 NOTE — Telephone Encounter (Signed)
F/u   Call the patient daughter to set up a new patient appt to see Dr. Sharlet Salina referral from her father Larry Lopez.   The mom was stating the daughter move here from [place] and wanted her to get the COVID vaccine.  I inform the mom that at this time, the vaccine is not offered at the office, mom verbalized understanding will call back in the future to set up an appointment

## 2019-01-15 NOTE — Telephone Encounter (Signed)
Pt has been informed.

## 2019-01-22 ENCOUNTER — Other Ambulatory Visit (HOSPITAL_COMMUNITY): Payer: Self-pay | Admitting: Internal Medicine

## 2019-01-22 ENCOUNTER — Other Ambulatory Visit: Payer: Self-pay | Admitting: Internal Medicine

## 2019-01-31 ENCOUNTER — Ambulatory Visit: Payer: Medicare Other | Attending: Internal Medicine

## 2019-01-31 ENCOUNTER — Telehealth: Payer: Self-pay

## 2019-01-31 DIAGNOSIS — Z23 Encounter for immunization: Secondary | ICD-10-CM | POA: Diagnosis not present

## 2019-01-31 NOTE — Progress Notes (Signed)
   Covid-19 Vaccination Clinic  Name:  Larry Lopez    MRN: [id number] DOB: August 13, 1926  01/31/2019  Larry Lopez was observed post Covid-19 immunization for 15 minutes without incidence. He was provided with Vaccine Information Sheet and instruction to access the V-Safe system.   Larry Lopez was instructed to call 911 with any severe reactions post vaccine: [name] Difficulty breathing  . Swelling of your face and throat  . A fast heartbeat  . A bad rash all over your body  . Dizziness and weakness    Immunizations Administered    Name Date Dose VIS Date Route   Pfizer COVID-19 Vaccine 01/31/2019 10:40 AM 0.3 mL 12/22/2018 Intramuscular   Manufacturer: [place]   Lot: [id number]   [place]: [id number]

## 2019-01-31 NOTE — Telephone Encounter (Signed)
Called and spoke with patient about DPR request from Isabella Stalling and that it cannot be mailed; it has to be filled out in the office. He states that he does not know a Isabella Stalling and to decline the request for information release. Patient states that he and his wife are not going out anywhere due to [place].    Copied from [place] [phone]. Topic: General - Other >> Jan 31, 2019  4:18 PM Leward Quan A wrote: Form Reason for CRM: Patient daughter Isabella Stalling called to request a release of information form ( DPR form ) mailed to home address so that she will be able to receive information on her parents now that she is living with them.

## 2019-02-09 DIAGNOSIS — Z87891 Personal history of nicotine dependence: Secondary | ICD-10-CM | POA: Diagnosis not present

## 2019-02-09 DIAGNOSIS — H906 Mixed conductive and sensorineural hearing loss, bilateral: Secondary | ICD-10-CM | POA: Diagnosis not present

## 2019-02-09 DIAGNOSIS — Z8669 Personal history of other diseases of the nervous system and sense organs: Secondary | ICD-10-CM | POA: Diagnosis not present

## 2019-02-09 DIAGNOSIS — H6123 Impacted cerumen, bilateral: Secondary | ICD-10-CM | POA: Diagnosis not present

## 2019-02-09 DIAGNOSIS — Z7289 Other problems related to lifestyle: Secondary | ICD-10-CM | POA: Diagnosis not present

## 2019-02-09 DIAGNOSIS — Z974 Presence of external hearing-aid: Secondary | ICD-10-CM | POA: Diagnosis not present

## 2019-02-14 DIAGNOSIS — H5340 Unspecified visual field defects: Secondary | ICD-10-CM | POA: Diagnosis not present

## 2019-02-14 DIAGNOSIS — Z961 Presence of intraocular lens: Secondary | ICD-10-CM | POA: Diagnosis not present

## 2019-02-14 DIAGNOSIS — H4720 Unspecified optic atrophy: Secondary | ICD-10-CM | POA: Diagnosis not present

## 2019-02-14 DIAGNOSIS — H401133 Primary open-angle glaucoma, bilateral, severe stage: Secondary | ICD-10-CM | POA: Diagnosis not present

## 2019-02-21 ENCOUNTER — Ambulatory Visit: Payer: Medicare Other | Attending: Internal Medicine

## 2019-02-21 DIAGNOSIS — Z23 Encounter for immunization: Secondary | ICD-10-CM | POA: Insufficient documentation

## 2019-02-21 NOTE — Progress Notes (Signed)
   Covid-19 Vaccination Clinic  Name:  Larry Lopez    MRN: [id number] DOB: 01/20/1926  02/21/2019  Larry Lopez was observed post Covid-19 immunization for 15 minutes without incidence. He was provided with Vaccine Information Sheet and instruction to access the V-Safe system.   Larry Lopez was instructed to call 911 with any severe reactions post vaccine: [name] Difficulty breathing  . Swelling of your face and throat  . A fast heartbeat  . A bad rash all over your body  . Dizziness and weakness    Immunizations Administered    Name Date Dose VIS Date Route   Pfizer COVID-19 Vaccine 02/21/2019  2:29 PM 0.3 mL 12/22/2018 Intramuscular   Manufacturer: [place]   Lot: [id number]   [place]: [id number]

## 2019-04-09 ENCOUNTER — Ambulatory Visit: Payer: Medicare Other | Admitting: Internal Medicine

## 2019-04-19 ENCOUNTER — Encounter: Payer: Self-pay | Admitting: Internal Medicine

## 2019-04-19 ENCOUNTER — Ambulatory Visit (INDEPENDENT_AMBULATORY_CARE_PROVIDER_SITE_OTHER): Payer: Medicare Other | Admitting: Internal Medicine

## 2019-04-19 ENCOUNTER — Other Ambulatory Visit: Payer: Self-pay

## 2019-04-19 VITALS — BP 128/78 | HR 85 | Temp 98.0°F | Ht 71.0 in | Wt 155.6 lb

## 2019-04-19 DIAGNOSIS — R21 Rash and other nonspecific skin eruption: Secondary | ICD-10-CM

## 2019-04-19 DIAGNOSIS — R0602 Shortness of breath: Secondary | ICD-10-CM

## 2019-04-19 DIAGNOSIS — E782 Mixed hyperlipidemia: Secondary | ICD-10-CM

## 2019-04-19 DIAGNOSIS — I10 Essential (primary) hypertension: Secondary | ICD-10-CM | POA: Diagnosis not present

## 2019-04-19 LAB — COMPREHENSIVE METABOLIC PANEL
ALT: 20 U/L (ref 0–53)
AST: 24 U/L (ref 0–37)
Albumin: 4.2 g/dL (ref 3.5–5.2)
Alkaline Phosphatase: 119 U/L — ABNORMAL HIGH (ref 39–117)
BUN: 15 mg/dL (ref 6–23)
CO2: 29 mEq/L (ref 19–32)
Calcium: 9.5 mg/dL (ref 8.4–10.5)
Chloride: 104 mEq/L (ref 96–112)
Creatinine, Ser: 0.97 mg/dL (ref 0.40–1.50)
GFR: 72.6 mL/min (ref >60.00)
Glucose, Bld: 94 mg/dL (ref 70–99)
Potassium: 5 mEq/L (ref 3.5–5.1)
Sodium: 139 mEq/L (ref 135–145)
Total Bilirubin: 1.1 mg/dL (ref 0.2–1.2)
Total Protein: 6.8 g/dL (ref 6.0–8.3)

## 2019-04-19 LAB — LIPID PANEL
Cholesterol: 140 mg/dL (ref 0–200)
HDL: 62.1 mg/dL (ref >39.00)
LDL Cholesterol: 65 mg/dL (ref 0–99)
NonHDL: 78.17
Total CHOL/HDL Ratio: 2
Triglycerides: 68 mg/dL (ref 0.0–149.0)
VLDL: 13.6 mg/dL (ref 0.0–40.0)

## 2019-04-19 LAB — CBC
HCT: 45.1 % (ref 39.0–52.0)
Hemoglobin: 15.4 g/dL (ref 13.0–17.0)
MCHC: 34.1 g/dL (ref 30.0–36.0)
MCV: 93 fl (ref 78.0–100.0)
Platelets: 176 10*3/uL (ref 150.0–400.0)
RBC: 4.85 Mil/uL (ref 4.22–5.81)
RDW: 14.7 % (ref 11.5–15.5)
WBC: 6.6 10*3/uL (ref 4.0–10.5)

## 2019-04-19 LAB — BRAIN NATRIURETIC PEPTIDE: Pro B Natriuretic peptide (BNP): 212 pg/mL — ABNORMAL HIGH (ref 0.0–100.0)

## 2019-04-19 MED ORDER — TRIAMCINOLONE ACETONIDE 0.1 % EX CREA: 1.0000 "application " | TOPICAL_CREAM | Freq: Two times a day (BID) | CUTANEOUS | 0 refills | Status: DC

## 2019-04-19 NOTE — Patient Instructions (Signed)
We will check the blood work.

## 2019-04-19 NOTE — Progress Notes (Signed)
   Subjective:   Patient ID: Larry Lopez, male    DOB: 02/01/1926, 86 y.o.   MRN: [id number]  HPI The patient is a 86 YO man coming in for follow up of blood pressure (taking hctz, denies side effects, denies chest pains or headaches) and rash (new since covid-19 vaccine, started off itching, now just slight itching and spots left, would like some cream to help it go away), and cholesterol (taking lipitor 40 mg daily, denies stroke or chest pains symptoms, no side effects).   Review of Systems  Constitutional: Negative.   HENT: Negative.   Eyes: Negative.   Respiratory: Negative for cough, chest tightness and shortness of breath.   Cardiovascular: Negative for chest pain, palpitations and leg swelling.  Gastrointestinal: Negative for abdominal distention, abdominal pain, constipation, diarrhea, nausea and vomiting.  Musculoskeletal: Negative.   Skin: Positive for rash.  Neurological: Negative.   Psychiatric/Behavioral: Negative.     Objective:  Physical Exam Constitutional:      Appearance: He is well-developed.  HENT:     Head: Normocephalic and atraumatic.  Cardiovascular:     Rate and Rhythm: Normal rate. Rhythm irregular.  Pulmonary:     Effort: Pulmonary effort is normal. No respiratory distress.     Breath sounds: Normal breath sounds. No wheezing or rales.  Abdominal:     General: Bowel sounds are normal. There is no distension.     Palpations: Abdomen is soft.     Tenderness: There is no abdominal tenderness. There is no rebound.  Musculoskeletal:     Cervical back: Normal range of motion.  Skin:    General: Skin is warm and dry.     Findings: Rash present.     Comments: Some rash right arm without stigmata of scratching  Neurological:     Mental Status: He is alert and oriented to person, place, and time.     Coordination: Coordination normal.     Vitals:   04/19/19 1101  BP: 128/78  Pulse: 85  Temp: 98 F (36.7 C)  TempSrc: Oral  SpO2: 99%  Weight:  155 lb 9.6 oz (70.6 kg)  Height: 5\' 11"  (1.803 m)    This visit occurred during the SARS-CoV-2 public health emergency.  Safety protocols were in place, including screening questions prior to the visit, additional usage of staff PPE, and extensive cleaning of exam room while observing appropriate contact time as indicated for disinfecting solutions.   Assessment & Plan:

## 2019-04-21 ENCOUNTER — Encounter: Payer: Self-pay | Admitting: Internal Medicine

## 2019-04-21 DIAGNOSIS — R21 Rash and other nonspecific skin eruption: Secondary | ICD-10-CM | POA: Insufficient documentation

## 2019-04-21 NOTE — Assessment & Plan Note (Signed)
Likely allergic and rx triamcinolone ointment.

## 2019-04-21 NOTE — Assessment & Plan Note (Signed)
Checking lipid panel and adjust lipitor 40 mg daily as needed. 

## 2019-04-21 NOTE — Assessment & Plan Note (Signed)
BP at goal, checking CMP and adjust as needed. Taking hctz and metoprolol prn only for a fib.

## 2019-04-23 ENCOUNTER — Telehealth: Payer: Self-pay

## 2019-04-23 ENCOUNTER — Other Ambulatory Visit (HOSPITAL_COMMUNITY): Payer: Self-pay | Admitting: Internal Medicine

## 2019-04-23 NOTE — Telephone Encounter (Signed)
They are in chart.

## 2019-04-23 NOTE — Telephone Encounter (Signed)
New message    Calling for test results  

## 2019-04-23 NOTE — Telephone Encounter (Signed)
Spoke w/pt wife they are requesting labs that was done on 04/19/19../l,mb

## 2019-04-23 NOTE — Telephone Encounter (Signed)
Notified pt wife w/lab results.Marland KitchenJohny Lopez

## 2019-04-25 ENCOUNTER — Other Ambulatory Visit: Payer: Self-pay | Admitting: Internal Medicine

## 2019-05-15 DIAGNOSIS — H6123 Impacted cerumen, bilateral: Secondary | ICD-10-CM | POA: Diagnosis not present

## 2019-05-15 DIAGNOSIS — H9193 Unspecified hearing loss, bilateral: Secondary | ICD-10-CM | POA: Diagnosis not present

## 2019-05-15 DIAGNOSIS — Z974 Presence of external hearing-aid: Secondary | ICD-10-CM | POA: Diagnosis not present

## 2019-05-23 ENCOUNTER — Telehealth: Payer: Self-pay | Admitting: Internal Medicine

## 2019-05-23 NOTE — Telephone Encounter (Signed)
New message:   1.Medication Requested: triamcinolone cream (KENALOG) 0.1 % 2. Pharmacy (Name, Street, [place]): [place] 3. On Med List: Yes  4. Last Visit with PCP: 04/19/19  5. Next visit date with PCP: None   Agent: Please be advised that RX refills may take up to 3 business days. We ask that you follow-up with your pharmacy.

## 2019-05-24 ENCOUNTER — Other Ambulatory Visit: Payer: Self-pay

## 2019-05-24 MED ORDER — TRIAMCINOLONE ACETONIDE 0.1 % EX CREA: 1.0000 "application " | TOPICAL_CREAM | Freq: Two times a day (BID) | CUTANEOUS | 0 refills | Status: DC

## 2019-05-24 NOTE — Telephone Encounter (Signed)
Refill sent in to pharmacy 

## 2019-05-24 NOTE — Telephone Encounter (Signed)
Fine to refill 

## 2019-05-30 ENCOUNTER — Other Ambulatory Visit: Payer: Self-pay

## 2019-05-30 DIAGNOSIS — H9193 Unspecified hearing loss, bilateral: Secondary | ICD-10-CM

## 2019-05-31 DIAGNOSIS — H903 Sensorineural hearing loss, bilateral: Secondary | ICD-10-CM | POA: Diagnosis not present

## 2019-07-31 ENCOUNTER — Other Ambulatory Visit: Payer: Self-pay | Admitting: Internal Medicine

## 2019-08-13 DIAGNOSIS — H401133 Primary open-angle glaucoma, bilateral, severe stage: Secondary | ICD-10-CM | POA: Diagnosis not present

## 2019-08-13 DIAGNOSIS — Z961 Presence of intraocular lens: Secondary | ICD-10-CM | POA: Diagnosis not present

## 2019-09-14 DIAGNOSIS — Z23 Encounter for immunization: Secondary | ICD-10-CM | POA: Diagnosis not present

## 2019-10-09 DIAGNOSIS — Z23 Encounter for immunization: Secondary | ICD-10-CM | POA: Diagnosis not present

## 2020-04-03 ENCOUNTER — Other Ambulatory Visit: Payer: Self-pay | Admitting: Internal Medicine

## 2020-04-07 ENCOUNTER — Other Ambulatory Visit (HOSPITAL_COMMUNITY): Payer: Self-pay | Admitting: Internal Medicine

## 2020-04-14 DIAGNOSIS — H401133 Primary open-angle glaucoma, bilateral, severe stage: Secondary | ICD-10-CM | POA: Diagnosis not present

## 2020-04-22 ENCOUNTER — Other Ambulatory Visit: Payer: Self-pay

## 2020-04-22 ENCOUNTER — Encounter: Payer: Self-pay | Admitting: Internal Medicine

## 2020-04-22 ENCOUNTER — Ambulatory Visit (INDEPENDENT_AMBULATORY_CARE_PROVIDER_SITE_OTHER): Payer: Medicare Other | Admitting: Internal Medicine

## 2020-04-22 VITALS — BP 116/72 | HR 83 | Temp 97.8°F | Resp 18 | Ht 71.0 in | Wt 153.4 lb

## 2020-04-22 DIAGNOSIS — E782 Mixed hyperlipidemia: Secondary | ICD-10-CM | POA: Diagnosis not present

## 2020-04-22 DIAGNOSIS — I10 Essential (primary) hypertension: Secondary | ICD-10-CM | POA: Diagnosis not present

## 2020-04-22 DIAGNOSIS — R413 Other amnesia: Secondary | ICD-10-CM

## 2020-04-22 LAB — LIPID PANEL
Cholesterol: 134 mg/dL (ref 0–200)
HDL: 59.7 mg/dL (ref >39.00)
LDL Cholesterol: 61 mg/dL (ref 0–99)
NonHDL: 74.67
Total CHOL/HDL Ratio: 2
Triglycerides: 70 mg/dL (ref 0.0–149.0)
VLDL: 14 mg/dL (ref 0.0–40.0)

## 2020-04-22 LAB — COMPREHENSIVE METABOLIC PANEL
ALT: 14 U/L (ref 0–53)
AST: 20 U/L (ref 0–37)
Albumin: 4 g/dL (ref 3.5–5.2)
Alkaline Phosphatase: 112 U/L (ref 39–117)
BUN: 16 mg/dL (ref 6–23)
CO2: 28 mEq/L (ref 19–32)
Calcium: 9.6 mg/dL (ref 8.4–10.5)
Chloride: 104 mEq/L (ref 96–112)
Creatinine, Ser: 1.02 mg/dL (ref 0.40–1.50)
GFR: 64.17 mL/min (ref >60.00)
Glucose, Bld: 80 mg/dL (ref 70–99)
Potassium: 4.6 mEq/L (ref 3.5–5.1)
Sodium: 140 mEq/L (ref 135–145)
Total Bilirubin: 1.2 mg/dL (ref 0.2–1.2)
Total Protein: 7 g/dL (ref 6.0–8.3)

## 2020-04-22 LAB — CBC
HCT: 45.4 % (ref 39.0–52.0)
Hemoglobin: 15.4 g/dL (ref 13.0–17.0)
MCHC: 34 g/dL (ref 30.0–36.0)
MCV: 92.4 fl (ref 78.0–100.0)
Platelets: 181 10*3/uL (ref 150.0–400.0)
RBC: 4.92 Mil/uL (ref 4.22–5.81)
RDW: 14.9 % (ref 11.5–15.5)
WBC: 5.9 10*3/uL (ref 4.0–10.5)

## 2020-04-22 NOTE — Patient Instructions (Signed)

## 2020-04-22 NOTE — Progress Notes (Signed)
   Subjective:   Patient ID: Larry Lopez, male    DOB: [birth year], 87 y.o.   MRN: [id number]  HPI The patient is a 87 YO man coming in for memory change (wife has some concerns about his memory in the past few months, denies abrupt change, more gradual, some problems with work finding, he is not sure this is a bad problem, prior head CT years ago with some loss and vascular disease) and follow up blood pressure (BP at goal, taking metoprolol and hctz, denies side effects, denies headaches or chest pains) and cholesterol (taking lipitor 40 mg daily, denies side effects, denies stoke symptoms or chest pains).   Review of Systems  Constitutional: Negative.   HENT: Negative.   Eyes: Negative.   Respiratory: Negative for cough, chest tightness and shortness of breath.   Cardiovascular: Negative for chest pain, palpitations and leg swelling.  Gastrointestinal: Negative for abdominal distention, abdominal pain, constipation, diarrhea, nausea and vomiting.  Musculoskeletal: Negative.   Skin: Negative.   Neurological: Negative.        Memory  Psychiatric/Behavioral: Negative.     Objective:  Physical Exam Constitutional:      Appearance: He is well-developed.  HENT:     Head: Normocephalic and atraumatic.  Cardiovascular:     Rate and Rhythm: Normal rate and regular rhythm.  Pulmonary:     Effort: Pulmonary effort is normal. No respiratory distress.     Breath sounds: Normal breath sounds. No wheezing or rales.  Abdominal:     General: Bowel sounds are normal. There is no distension.     Palpations: Abdomen is soft.     Tenderness: There is no abdominal tenderness. There is no rebound.  Musculoskeletal:     Cervical back: Normal range of motion.  Skin:    General: Skin is warm and dry.  Neurological:     Mental Status: He is alert and oriented to person, place, and time.     Coordination: Coordination normal.     Vitals:   04/22/20 1427  BP: 116/72  Pulse: 83  Resp: 18   Temp: 97.8 F (36.6 C)  TempSrc: Oral  SpO2: 98%  Weight: 153 lb 6.4 oz (69.6 kg)  Height: 5\' 11"  (1.803 m)    This visit occurred during the SARS-CoV-2 public health emergency.  Safety protocols were in place, including screening questions prior to the visit, additional usage of staff PPE, and extensive cleaning of exam room while observing appropriate contact time as indicated for disinfecting solutions.   Assessment & Plan:

## 2020-04-24 DIAGNOSIS — R413 Other amnesia: Secondary | ICD-10-CM | POA: Insufficient documentation

## 2020-04-24 NOTE — Assessment & Plan Note (Signed)
Checking CT head to assess for changes. He did not feel medication was necessary at this time. Likely he would benefit from aricept to prevent further changes.

## 2020-04-24 NOTE — Assessment & Plan Note (Signed)
Taking hctz 25 mg daily and metoprolol 25 mg prn. Checking CMP and adjust as needed.

## 2020-04-24 NOTE — Assessment & Plan Note (Signed)
Checking lipid panel and adjust lipitor 40 mg daily as needed. 

## 2020-05-06 ENCOUNTER — Other Ambulatory Visit: Payer: Medicare Other

## 2020-05-19 ENCOUNTER — Ambulatory Visit
Admission: RE | Admit: 2020-05-19 | Discharge: 2020-05-19 | Disposition: A | Discharge status: Home / Self Care | Payer: Medicare Other | Source: Ambulatory Visit | Attending: Internal Medicine | Admitting: Internal Medicine

## 2020-05-19 DIAGNOSIS — R413 Other amnesia: Secondary | ICD-10-CM

## 2020-05-20 ENCOUNTER — Other Ambulatory Visit: Payer: Self-pay | Admitting: Internal Medicine

## 2020-05-29 ENCOUNTER — Telehealth: Payer: Self-pay | Admitting: Internal Medicine

## 2020-05-29 NOTE — Progress Notes (Signed)
  Chronic Care Management   Note  05/29/2020 Name: Larry Lopez MRN: [id number] DOB: [birth year]  Larry Lopez is a 87 y.o. year old male who is a primary care patient of Hoyt Koch, MD. I reached out to [place] by phone today in response to a referral sent by Larry Lopez's PCP, Hoyt Koch, MD.   Larry Lopez was given information about Chronic Care Management services today including:  1. CCM service includes personalized support from designated clinical staff supervised by his physician, including individualized plan of care and coordination with other care providers 2. 24/7 contact phone numbers for assistance for urgent and routine care needs. 3. Service will only be billed when office clinical staff spend 20 minutes or more in a month to coordinate care. 4. Only one practitioner may furnish and bill the service in a calendar month. 5. The patient may stop CCM services at any time (effective at the end of the month) by phone call to the office staff.   Patient agreed to services and verbal consent obtained.   Follow up plan:  [place]

## 2020-06-10 ENCOUNTER — Other Ambulatory Visit (HOSPITAL_COMMUNITY): Payer: Self-pay | Admitting: Internal Medicine

## 2020-06-13 NOTE — Progress Notes (Addendum)
Chronic Care Management Pharmacy Note  06/16/2020 Name:  Larry Lopez MRN:  [id number] DOB:  March 02, 1926  Subjective: Larry Lopez is an 87 y.o. year old male who is a primary patient of Hoyt Koch, MD.  The CCM team was consulted for assistance with disease management and care coordination needs.    Engaged with patient by telephone for initial visit in response to provider referral for pharmacy case management and/or care coordination services.   Consent to Services:  The patient was given the following information about Chronic Care Management services today, agreed to services, and gave verbal consent: 1. CCM service includes personalized support from designated clinical staff supervised by the primary care provider, including individualized plan of care and coordination with other care providers 2. 24/7 contact phone numbers for assistance for urgent and routine care needs. 3. Service will only be billed when office clinical staff spend 20 minutes or more in a month to coordinate care. 4. Only one practitioner may furnish and bill the service in a calendar month. 5.The patient may stop CCM services at any time (effective at the end of the month) by phone call to the office staff. 6. The patient will be responsible for cost sharing (co-pay) of up to 20% of the service fee (after annual deductible is met). Patient agreed to services and consent obtained.  Patient Care Team: Hoyt Koch, MD as PCP - General (Internal Medicine) Bensimhon, Shaune Pascal, MD (Cardiology) Shon Hough, MD (Ophthalmology) Alda Berthold, DO as Consulting Physician (Neurology) Tomasa Blase, [facility] as Pharmacist (Pharmacist)  Recent office visits: 04/22/2020 - PCP Visit - noted memory changes -  Follow up for HTN and HLD labs  04/19/2019 - PCP Visit - Follow up for HTN and lab work - no medication changes   Recent consult visits: 08/13/2019 - Dr. Kathrin Penner [facility]  visits: None in previous 6 months  Objective:  Lab Results  Component Value Date   CREATININE 1.02 04/22/2020   BUN 16 04/22/2020   GFR 64.17 04/22/2020   GFRNONAA >60 07/21/2016   GFRAA >60 07/21/2016   NA 140 04/22/2020   K 4.6 04/22/2020   CALCIUM 9.6 04/22/2020   CO2 28 04/22/2020   GLUCOSE 80 04/22/2020    Lab Results  Component Value Date/Time   HGBA1C 5.8 04/01/2015 02:21 PM   GFR 64.17 04/22/2020 03:07 PM   GFR 72.60 04/19/2019 11:48 AM    Last diabetic Eye exam:  No results found for: HMDIABEYEEXA  Last diabetic Foot exam:  No results found for: HMDIABFOOTEX   Lab Results  Component Value Date   CHOL 134 04/22/2020   HDL 59.70 04/22/2020   LDLCALC 61 04/22/2020   TRIG 70.0 04/22/2020   CHOLHDL 2 04/22/2020    Hepatic Function Latest Ref Rng & Units 04/22/2020 04/19/2019 09/05/2017  Total Protein 6.0 - 8.3 g/dL 7.0 6.8 6.5  Albumin 3.5 - 5.2 g/dL 4.0 4.2 4.0  AST 0 - 37 U/L '20 24 21  ' ALT 0 - 53 U/L '14 20 16  ' Alk Phosphatase 39 - 117 U/L 112 119(H) 125(H)  Total Bilirubin 0.2 - 1.2 mg/dL 1.2 1.1 1.3(H)  Bilirubin, Direct 0.0 - 0.3 mg/dL - - -    Lab Results  Component Value Date/Time   TSH 2.19 04/27/2016 01:59 PM   TSH 1.03 07/23/2008 11:55 AM    CBC Latest Ref Rng & Units 04/22/2020 04/19/2019 09/05/2017  WBC 4.0 - 10.5 K/uL 5.9 6.6  5.2  Hemoglobin 13.0 - 17.0 g/dL 15.4 15.4 14.5  Hematocrit 39.0 - 52.0 % 45.4 45.1 42.8  Platelets 150.0 - 400.0 K/uL 181.0 176.0 177.0    No results found for: VD25OH  Clinical ASCVD: Yes  The ASCVD Risk score Mikey Bussing DC Jr., et al., 2013) failed to calculate for the following reasons:   The 2013 ASCVD risk score is only valid for ages 75 to 67   The patient has a prior MI or stroke diagnosis    Depression screen [facility] 2/9 04/22/2020 10/06/2018 08/03/2017  Decreased Interest 0 0 0  Down, Depressed, Hopeless 0 1 1  PHQ - 2 Score 0 1 1  Altered sleeping - 0 0  Tired, decreased energy - 1 1  Change in appetite - 0 0   Feeling bad or failure about yourself  - 1 1  Trouble concentrating - - 0  Moving slowly or fidgety/restless - - 0  PHQ-9 Score - 3 3  Difficult doing work/chores - Not difficult at all Not difficult at all     Social History   Tobacco Use  Smoking Status Former Smoker  . Quit date: 01/12/1967  . Years since quitting: 53.4  Smokeless Tobacco Never Used   BP Readings from Last 3 Encounters:  04/22/20 116/72  04/19/19 128/78  12/06/18 122/60   Pulse Readings from Last 3 Encounters:  04/22/20 83  04/19/19 85  12/06/18 80   Wt Readings from Last 3 Encounters:  04/22/20 153 lb 6.4 oz (69.6 kg)  04/19/19 155 lb 9.6 oz (70.6 kg)  12/06/18 150 lb 9.6 oz (68.3 kg)   BMI Readings from Last 3 Encounters:  04/22/20 21.39 kg/m  04/19/19 21.70 kg/m  12/06/18 21.00 kg/m    Assessment/Interventions: Review of patient past medical history, allergies, medications, health status, including review of consultants reports, laboratory and other test data, was performed as part of comprehensive evaluation and provision of chronic care management services.   SDOH:  (Social Determinants of Health) assessments and interventions performed: Yes  SDOH Screenings   Alcohol Screen: Not on file  Depression (PHQ2-9): Low Risk   . PHQ-2 Score: 0  Financial Resource Strain: Low Risk   . Difficulty of Paying Living Expenses: Not hard at all  Food Insecurity: Not on file  Housing: Not on file  Physical Activity: Not on file  Social Connections: Not on file  Stress: Not on file  Tobacco Use: Medium Risk  . Smoking Tobacco Use: Former Smoker  . Smokeless Tobacco Use: Never Used  Transportation Needs: Not on file    [place]  No Known Allergies  Medications Reviewed Today    Reviewed by Tomasa Blase, [facility] (Pharmacist) on 06/16/20 at [place] List Status: <None>  Medication Order Taking? Sig Documenting Provider Last Dose Status Informant  atorvastatin (LIPITOR) 40 MG tablet  [id number] Yes Take 1 tablet by mouth once daily Bensimhon, Shaune Pascal, MD Taking Active   B Complex-C (SUPER B COMPLEX PO) [id number] No Take 1 tablet by mouth daily.   Patient not taking: Reported on 06/16/2020   [provider] Not Taking Consider Medication Status and Discontinue Self  calcium carbonate (OSCAL) 1500 (600 Ca) MG TABS tablet [id number] Yes Take 1 tablet by mouth daily. [provider] Taking Active Self  ELIQUIS 2.5 MG TABS tablet [id number] Yes Take 1 tablet by mouth twice daily Bensimhon, Shaune Pascal, MD Taking Active   Emollient [facility]) [place] [id number] Yes Apply topically. Applied to scalp  as needed [provider] Taking Active   Ginger, Zingiber officinalis, (GINGER PO) [id number] Yes See admin instructions. Chew a small portion of ginger root as needed for indigestion [provider] Taking Active Self  hydrochlorothiazide (HYDRODIURIL) 25 MG tablet [id number] Yes Take 1 tablet by mouth once daily Bensimhon, Shaune Pascal, MD Taking Active   latanoprost (XALATAN) 0.005 % ophthalmic solution [id number] No   Patient not taking: Reported on 06/16/2020   [provider] Not Taking Active   metoprolol tartrate (LOPRESSOR) 25 MG tablet [id number] No TAKE 1 TABLET BY MOUTH ONCE DAILY AS NEEDED. ONLY  USES  IF  NEEDED  FOR  TACHYCARDIA.  HAS  NOT  NEEDED  IN  OVER  A  YEAR  Patient not taking: Reported on 06/16/2020   Bensimhon, Shaune Pascal, MD Not Taking Active   Multiple Vitamin (MULTI-VITAMIN) tablet [id number] Yes Take by mouth. [provider] Taking Active   nitroGLYCERIN (NITROSTAT) 0.4 MG SL tablet [id number] No Place 1 tablet (0.4 mg total) under the tongue every 5 (five) minutes as needed for chest pain.  Patient not taking: Reported on 06/16/2020   Bensimhon, Shaune Pascal, MD Not Taking Active   tamsulosin [facility]) 0.4 MG CAPS capsule [id number] Yes Take 1 capsule by mouth once daily Hoyt Koch, MD Taking Active   timolol (TIMOPTIC) 0.5 %  ophthalmic solution [id number] Yes INSTILL 1 DROP INTO [facility] EYE TWICE DAILY [provider] Taking Active   triamcinolone cream (KENALOG) 0.1 % [id number] No APPLY  CREAM TOPICALLY TO AFFECTED AREA TWICE DAILY  Patient not taking: Reported on 06/16/2020   Hoyt Koch, MD Not Taking Consider Medication Status and Discontinue (Change in therapy)           Patient Active Problem List   Diagnosis Date Noted  . Memory change 04/24/2020  . Rash 04/21/2019  . Right inguinal hernia 09/05/2017  . Urinary frequency 09/05/2017  . Spleen hematoma 07/20/2016  . Coronary artery disease involving native coronary artery 11/05/2013  . Imbalance 11/05/2013  . Routine health maintenance 08/22/2010  . Hearing loss 08/22/2009  . ATRIAL FIBRILLATION 06/27/2008  . Hyperlipidemia 12/09/2006  . Essential hypertension 12/09/2006  . MYOCARDIAL INFARCTION, HX OF 12/09/2006  . CAD (coronary artery disease) 12/09/2006    Immunization History  Administered Date(s) Administered  . H1N1 12/25/2007  . Influenza Split 09/12/2011  . Influenza Whole 10/23/2008, 12/13/2013  . Influenza, High Dose Seasonal PF 09/30/2017, 09/08/2018, 09/21/2018  . Influenza-Unspecified 11/11/2012, 10/26/2014  . PFIZER(Purple Top)SARS-COV-2 Vaccination 01/31/2019, 02/21/2019  . Pneumococcal Conjugate-13 10/23/2012  . Pneumococcal Polysaccharide-23 05/13/2003  . Tdap 08/19/2010  . Zoster Recombinat (Shingrix) 09/30/2017, 01/05/2018    Conditions to be addressed/monitored:  Hypertension, Hyperlipidemia, Atrial Fibrillation, BPH and glaucoma   Care Plan : CCM Care Plan  Updates made by Tomasa Blase, [place] since 06/16/2020 12:00 AM    Problem: HTN, HLD, BPH, Afib, Glaucoma   Priority: High  Onset Date: 06/16/2020    Long-Range Goal: Disease Management   Start Date: 06/16/2020  Expected End Date: 12/16/2020  This Visit's Progress: On track  Priority: High  Note:   Current Barriers:  . Unable to independently  monitor therapeutic efficacy  Pharmacist Clinical Goal(s):  [name] Patient will verbalize ability to afford treatment regimen . maintain control of LDL and BP as evidenced by next lipid panel and BP log   through collaboration with PharmD and provider.   Interventions: . 1:1 collaboration with Hoyt Koch, MD regarding development and  update of comprehensive plan of care as evidenced by provider attestation and co-signature . Inter-disciplinary care team collaboration (see longitudinal plan of care) . Comprehensive medication review performed; medication list updated in electronic medical record  Hypertension / Coronary Artery Disease  (BP goal <140/90) -Controlled -Current treatment: . HCTZ 33m - 1 tablet daily  . Nitroglycerin 0.417mSL tablets - 1 tablet as needed for chest pains  -Medications previously tried: Chlorthalidone, lisinopril,  -Current home readings: reports that he has not checked recently, but states last time he had checked it was in range -Current dietary habits: reports to eating a diet that is low in salt / caffeine  -Current exercise habits: daily to every other day walking / sitting weighted exercises in his home  -Denies hypotensive/hypertensive symptoms -Educated on BP goals and benefits of medications for prevention of heart attack, stroke and kidney damage; Daily salt intake goal < 2300 mg; Importance of home blood pressure monitoring; Symptoms of hypotension and importance of maintaining adequate hydration; -Counseled to monitor BP at home every 2 weeks , document, and provide log at future appointments -Counseled on diet and exercise extensively Recommended to continue current medication  Hyperlipidemia: (LDL goal < 70) -Controlled  - Last LDL 61 mg/dL 04/22/2020 -Current treatment: . Atorvastatin 4041maily  -Medications previously tried: n/a  -Current dietary patterns: reports that he is eating a heart healthy diet  -Current exercise habits:  daily to every other day exercise - walking / sitting weighted exercises in his home  -Educated on Cholesterol goals;  Benefits of statin for ASCVD risk reduction; Importance of limiting foods high in cholesterol; -Counseled on diet and exercise extensively Recommended to continue current medication  Atrial Fibrillation (Goal: prevent stroke and major bleeding) -Controlled -CHADSVASC: 4 - (Age (2), HTN, CAD) -Current treatment: . Rate control: Metoprolol Tartrate 41m29m1 tablet daily as needed for tachycardia - has not taken for >1 month . Anticoagulation: Eliquis 2.5mg 37m tablet twice daily                    - prescribed by Dr. BensiHaroldine [facility]) - reduced at appointment 05/20/2017 due to patient's age and weight  -Medications previously tried: warfarin -Home BP and HR readings: per patient has been in range when last checked which was >2 months ago   -Counseled on increased risk of stroke due to Afib and benefits of anticoagulation for stroke prevention; importance of adherence to anticoagulant exactly as prescribed; bleeding risk associated with Eliquis and importance of self-monitoring for signs/symptoms of bleeding; seeking medical attention after a head injury or if there is blood in the urine/stool; -Recommended to continue current medication  BPH(Goal: Prevention of urination issues) -Controlled -Current treatment  . Tamsulosin 0.4mg d53my  -Medications previously tried: n/a  -Recommended to continue current medication   Glaucoma (Goal: Control of ocular pressure / prevention of disease progression ) -Controlled -Current treatment  . Timolol 0.5% opthalmic solution - 1 drop into both eyes daily  . Latanoprost 0.005% solution - 1 drop into each eye nightly  -Medications previously tried: n/a  -Recommended to continue current medication   Health Maintenance -Current therapy:  . Ginger, Zingiber officinalis - as needed for indigestion - taking once daily before a  meal . B complex - 1 tablet daily  . Multivitamin - 1 tablet daily  . Ceravae cream -applied daily to scalp as needed   -Educated on Herbal supplement research is limited and benefits usually cannot be proven Cost vs  benefit of each product must be carefully weighed by individual consumer -Patient is satisfied with current therapy and denies issues -Recommended to continue current medication  Patient Goals/Self-Care Activities . Patient will:  - take medications as prescribed check blood pressure every 2 weeks , document, and provide at future appointments  Follow Up Plan: Telephone follow up appointment with care management team member scheduled for: The patient has been provided with contact information for the care management team and has been advised to call with any health related questions or concerns.         Medication Assistance: None required.  Patient affirms current coverage meets needs.  Patient's preferred pharmacy is:  [facility], [place] [address] Phone: [phone] Fax: [phone]  Uses pill box? No - able to manage without  Pt endorses 100% compliance  Care Plan and Follow Up Patient Decision:  Patient agrees to Care Plan and Follow-up.  Plan: Telephone follow up appointment with care management team member scheduled for:  6 months  and The patient has been provided with contact information for the care management team and has been advised to call with any health related questions or concerns.   Tomasa Blase, PharmD Clinical Pharmacist, [place]  06/16/20

## 2020-06-16 ENCOUNTER — Other Ambulatory Visit: Payer: Self-pay

## 2020-06-16 ENCOUNTER — Ambulatory Visit (INDEPENDENT_AMBULATORY_CARE_PROVIDER_SITE_OTHER): Payer: Medicare Other

## 2020-06-16 DIAGNOSIS — E782 Mixed hyperlipidemia: Secondary | ICD-10-CM | POA: Diagnosis not present

## 2020-06-16 DIAGNOSIS — R350 Frequency of micturition: Secondary | ICD-10-CM

## 2020-06-16 DIAGNOSIS — I4821 Permanent atrial fibrillation: Secondary | ICD-10-CM | POA: Diagnosis not present

## 2020-06-16 DIAGNOSIS — I2510 Atherosclerotic heart disease of native coronary artery without angina pectoris: Secondary | ICD-10-CM | POA: Diagnosis not present

## 2020-06-16 DIAGNOSIS — I2583 Coronary atherosclerosis due to lipid rich plaque: Secondary | ICD-10-CM | POA: Diagnosis not present

## 2020-06-16 DIAGNOSIS — I10 Essential (primary) hypertension: Secondary | ICD-10-CM | POA: Diagnosis not present

## 2020-06-16 NOTE — Patient Instructions (Addendum)
Visit Information   PATIENT GOALS:  Goals Addressed            This Visit's Progress   . Track and Manage My Blood Pressure-Hypertension       Timeframe:  Long-Range Goal Priority:  High Start Date:   06/16/2020                          Expected End Date: 12/16/2020                      Follow Up Date 12/16/2020   - check blood pressure every 2 weeks   - choose a place to take my blood pressure (home, clinic or office, retail store) - write blood pressure results in a log or diary    Why is this important?    You won't feel high blood pressure, but it can still hurt your blood vessels.   High blood pressure can cause heart or kidney problems. It can also cause a stroke.   Making lifestyle changes like losing a little weight or eating less salt will help.   Checking your blood pressure at home and at different times of the day can help to control blood pressure.   If the doctor prescribes medicine remember to take it the way the doctor ordered.   Call the office if you cannot afford the medicine or if there are questions about it.     Notes: Patient will reach out to office should blood pressures become uncontrolled        Consent to CCM Services: Larry Lopez was given information about Chronic Care Management services today including:  1. CCM service includes personalized support from designated clinical staff supervised by his physician, including individualized plan of care and coordination with other care providers 2. 24/7 contact phone numbers for assistance for urgent and routine care needs. 3. Service will only be billed when office clinical staff spend 20 minutes or more in a month to coordinate care. 4. Only one practitioner may furnish and bill the service in a calendar month. 5. The patient may stop CCM services at any time (effective at the end of the month) by phone call to the office staff. 6. The patient will be responsible for cost sharing (co-pay) of up to  20% of the service fee (after annual deductible is met).  Patient agreed to services and verbal consent obtained.   Patient verbalizes understanding of instructions provided today and agrees to view in [place].   Telephone follow up appointment with care management team member scheduled for: The patient has been provided with contact information for the care management team and has been advised to call with any health related questions or concerns.   Larry Lopez, PharmD Clinical Pharmacist, [place]    CLINICAL CARE PLAN: Patient Care Plan: CCM Care Plan    Problem Identified: HTN, HLD, BPH, Afib, Glaucoma   Priority: High  Onset Date: 06/16/2020    Long-Range Goal: Disease Management   Start Date: 06/16/2020  Expected End Date: 12/16/2020  This Visit's Progress: On track  Priority: High  Note:   Current Barriers:  . Unable to independently monitor therapeutic efficacy  Pharmacist Clinical Goal(s):  [name] Patient will verbalize ability to afford treatment regimen . maintain control of LDL and BP as evidenced by next lipid panel and BP log   through collaboration with PharmD and provider.   Interventions: . 1:1  collaboration with Larry Koch, MD regarding development and update of comprehensive plan of care as evidenced by provider attestation and co-signature . Inter-disciplinary care team collaboration (see longitudinal plan of care) . Comprehensive medication review performed; medication list updated in electronic medical record  Hypertension / Coronary Artery Disease  (BP goal <140/90) -Controlled -Current treatment: . HCTZ 8m - 1 tablet daily  . Nitroglycerin 0.428mSL tablets - 1 tablet as needed for chest pains  -Medications previously tried: Chlorthalidone, lisinopril,  -Current home readings: reports that he has not checked recently, but states last time he had checked it was in range -Current dietary habits: reports to eating a diet that is low  in salt / caffeine  -Current exercise habits: daily to every other day walking / sitting weighted exercises in his home  -Denies hypotensive/hypertensive symptoms -Educated on BP goals and benefits of medications for prevention of heart attack, stroke and kidney damage; Daily salt intake goal < 2300 mg; Importance of home blood pressure monitoring; Symptoms of hypotension and importance of maintaining adequate hydration; -Counseled to monitor BP at home every 2 weeks , document, and provide log at future appointments -Counseled on diet and exercise extensively Recommended to continue current medication  Hyperlipidemia: (LDL goal < 70) -Controlled  - Last LDL 61 mg/dL 04/22/2020 -Current treatment: . Atorvastatin 406maily  -Medications previously tried: n/a  -Current dietary patterns: reports that he is eating a heart healthy diet  -Current exercise habits: daily to every other day exercise - walking / sitting weighted exercises in his home  -Educated on Cholesterol goals;  Benefits of statin for ASCVD risk reduction; Importance of limiting foods high in cholesterol; -Counseled on diet and exercise extensively Recommended to continue current medication  Atrial Fibrillation (Goal: prevent stroke and major bleeding) -Controlled -CHADSVASC: 4 - (Age (2), HTN, CAD) -Current treatment: . Rate control: Metoprolol Tartrate 37m32m1 tablet daily as needed for tachycardia - has not taken for >1 month . Anticoagulation: Eliquis 2.5mg 30m tablet twice daily                    - prescribed by Dr. [facility]old[facility]) - reduced at appointment 05/20/2017 due to patient's age and weight  -Medications previously tried: warfarin -Home BP and HR readings: per patient has been in range when last checked which was >2 months ago   -Counseled on increased risk of stroke due to Afib and benefits of anticoagulation for stroke prevention; importance of adherence to anticoagulant exactly as  prescribed; bleeding risk associated with Eliquis and importance of self-monitoring for signs/symptoms of bleeding; seeking medical attention after a head injury or if there is blood in the urine/stool; -Recommended to continue current medication  BPH(Goal: Prevention of urination issues) -Controlled -Current treatment  . Tamsulosin 0.4mg d26my  -Medications previously tried: n/a  -Recommended to continue current medication   Glaucoma (Goal: Control of ocular pressure / prevention of disease progression ) -Controlled -Current treatment  . Timolol 0.5% opthalmic solution - 1 drop into both eyes daily  . Latanoprost 0.005% solution - 1 drop into each eye nightly  -Medications previously tried: n/a  -Recommended to continue current medication   Health Maintenance -Current therapy:  . Ginger, Zingiber officinalis - as needed for indigestion - taking once daily before a meal . B complex - 1 tablet daily  . Multivitamin - 1 tablet daily  . Ceravae cream -applied daily to scalp as needed   -Educated on Herbal supplement research is  limited and benefits usually cannot be proven Cost vs benefit of each product must be carefully weighed by individual consumer -Patient is satisfied with current therapy and denies issues -Recommended to continue current medication  Patient Goals/Self-Care Activities . Patient will:  - take medications as prescribed check blood pressure every 2 weeks , document, and provide at future appointments  Follow Up Plan: Telephone follow up appointment with care management team member scheduled for: The patient has been provided with contact information for the care management team and has been advised to call with any health related questions or concerns.          Hypertension, Adult Hypertension is another name for high blood pressure. High blood pressure forces your heart to work harder to pump blood. This can cause problems over time. There are two  numbers in a blood pressure reading. There is a top number (systolic) over a bottom number (diastolic). It is best to have a blood pressure that is below 120/80. Healthy choices can help lower your blood pressure, or you may need medicine to help lower it. What are the causes? The cause of this condition is not known. Some conditions may be related to high blood pressure. What increases the risk?  Smoking.  Having type 2 diabetes mellitus, high cholesterol, or both.  Not getting enough exercise or physical activity.  Being overweight.  Having too much fat, sugar, calories, or salt (sodium) in your diet.  Drinking too much alcohol.  Having long-term (chronic) kidney disease.  Having a family history of high blood pressure.  Age. Risk increases with age.  Race. You may be at higher risk if you are African American.  Gender. Men are at higher risk than women before age 47. After age 63, women are at higher risk than men.  Having obstructive sleep apnea.  Stress. What are the signs or symptoms?  High blood pressure may not cause symptoms. Very high blood pressure (hypertensive crisis) may cause: ? Headache. ? Feelings of worry or nervousness (anxiety). ? Shortness of breath. ? Nosebleed. ? A feeling of being sick to your stomach (nausea). ? Throwing up (vomiting). ? Changes in how you see. ? Very bad chest pain. ? Seizures. How is this treated?  This condition is treated by making healthy lifestyle changes, such as: ? Eating healthy foods. ? Exercising more. ? Drinking less alcohol.  Your health care provider may prescribe medicine if lifestyle changes are not enough to get your blood pressure under control, and if: ? Your top number is above 130. ? Your bottom number is above 80.  Your personal target blood pressure may vary. Follow these instructions at home: Eating and drinking  If told, follow the DASH eating plan. To follow this plan: ? Fill one half of your  plate at each meal with fruits and vegetables. ? Fill one fourth of your plate at each meal with whole grains. Whole grains include whole-wheat pasta, brown rice, and whole-grain bread. ? Eat or drink low-fat dairy products, such as skim milk or low-fat yogurt. ? Fill one fourth of your plate at each meal with low-fat (lean) proteins. Low-fat proteins include fish, chicken without skin, eggs, beans, and tofu. ? Avoid fatty meat, cured and processed meat, or chicken with skin. ? Avoid pre-made or processed food.  Eat less than 1,500 mg of salt each day.  Do not drink alcohol if: ? Your doctor tells you not to drink. ? You are pregnant, may be pregnant, or  are planning to become pregnant.  If you drink alcohol: ? Limit how much you use to:  0-1 drink a day for women.  0-2 drinks a day for men. ? Be aware of how much alcohol is in your drink. In the [place] one drink equals one 12 oz bottle of beer (355 mL), one 5 oz glass of wine (148 mL), or one 1 oz glass of hard liquor (44 mL).   Lifestyle  Work with your doctor to stay at a healthy weight or to lose weight. Ask your doctor what the best weight is for you.  Get at least 30 minutes of exercise most days of the week. This may include walking, swimming, or biking.  Get at least 30 minutes of exercise that strengthens your muscles (resistance exercise) at least 3 days a week. This may include lifting weights or doing Pilates.  Do not use any products that contain nicotine or tobacco, such as cigarettes, e-cigarettes, and chewing tobacco. If you need help quitting, ask your doctor.  Check your blood pressure at home as told by your doctor.  Keep all follow-up visits as told by your doctor. This is important.   Medicines  Take over-the-counter and prescription medicines only as told by your doctor. Follow directions carefully.  Do not skip doses of blood pressure medicine. The medicine does not work as well if you skip doses. Skipping  doses also puts you at risk for problems.  Ask your doctor about side effects or reactions to medicines that you should watch for. Contact a doctor if you:  Think you are having a reaction to the medicine you are taking.  Have headaches that keep coming back (recurring).  Feel dizzy.  Have swelling in your ankles.  Have trouble with your vision. Get help right away if you:  Get a very bad headache.  Start to feel mixed up (confused).  Feel weak or numb.  Feel faint.  Have very bad pain in your: ? Chest. ? Belly (abdomen).  Throw up more than once.  Have trouble breathing. Summary  Hypertension is another name for high blood pressure.  High blood pressure forces your heart to work harder to pump blood.  For most people, a normal blood pressure is less than 120/80.  Making healthy choices can help lower blood pressure. If your blood pressure does not get lower with healthy choices, you may need to take medicine. This information is not intended to replace advice given to you by your health care provider. Make sure you discuss any questions you have with your health care provider. Document Revised: 09/07/2017 Document Reviewed: 09/07/2017 Elsevier Patient Education  2021 [employer].

## 2020-06-23 DIAGNOSIS — Z23 Encounter for immunization: Secondary | ICD-10-CM | POA: Diagnosis not present

## 2020-07-02 DIAGNOSIS — H6123 Impacted cerumen, bilateral: Secondary | ICD-10-CM | POA: Diagnosis not present

## 2020-07-29 ENCOUNTER — Telehealth: Payer: Self-pay | Admitting: Internal Medicine

## 2020-07-29 NOTE — Telephone Encounter (Signed)
[facility] FYI 7.16.22:  --caller states her husband has fatigue, drowsiness and a fever. 100, temporal. she is scared about covid.   Advised home care

## 2020-07-29 NOTE — Telephone Encounter (Signed)
Wife states normal temp at this time.  Pt exercised in swimming pool this morning & is now in shower.  Wife states home swab x2 today was negative.  Wife advised that since pt seems to have recovered & is doing well at this time, to call back if symptoms return.  Wife verb understanding.

## 2020-08-01 ENCOUNTER — Telehealth (HOSPITAL_COMMUNITY): Payer: Self-pay | Admitting: *Deleted

## 2020-08-01 NOTE — Telephone Encounter (Signed)
Pt left vm requesting a call back about eliquis dose. I called pt back and verified dose with him. Pt is to take 2.'5mg'$  twice daily. Pt thanked me for the call.

## 2020-08-04 ENCOUNTER — Telehealth (HOSPITAL_COMMUNITY): Payer: Self-pay | Admitting: *Deleted

## 2020-08-04 NOTE — Telephone Encounter (Signed)
Pt left vm this morning stating he forgot how to take his eliquis and asked for a return call. I called back and spoke with pt and advised him to take eliquis 2.'5mg'$  twice daily. Pt thanked me for the call.

## 2020-08-12 ENCOUNTER — Other Ambulatory Visit (HOSPITAL_COMMUNITY): Payer: Self-pay | Admitting: *Deleted

## 2020-08-12 ENCOUNTER — Telehealth (HOSPITAL_COMMUNITY): Payer: Self-pay | Admitting: *Deleted

## 2020-08-12 ENCOUNTER — Other Ambulatory Visit (HOSPITAL_COMMUNITY): Payer: Self-pay | Admitting: Internal Medicine

## 2020-08-12 NOTE — Telephone Encounter (Signed)
Pt left vm requesting a return call about how to take his eliquis. I called pt back and went over instructions. "Take 2.'5mg'$  twice daily"

## 2020-08-18 DIAGNOSIS — H401133 Primary open-angle glaucoma, bilateral, severe stage: Secondary | ICD-10-CM | POA: Diagnosis not present

## 2020-08-18 DIAGNOSIS — D3131 Benign neoplasm of right choroid: Secondary | ICD-10-CM | POA: Diagnosis not present

## 2020-08-18 DIAGNOSIS — H0100A Unspecified blepharitis right eye, upper and lower eyelids: Secondary | ICD-10-CM | POA: Diagnosis not present

## 2020-08-18 DIAGNOSIS — H524 Presbyopia: Secondary | ICD-10-CM | POA: Diagnosis not present

## 2020-09-03 ENCOUNTER — Other Ambulatory Visit (HOSPITAL_COMMUNITY): Payer: Self-pay | Admitting: Internal Medicine

## 2020-09-12 ENCOUNTER — Telehealth: Payer: Self-pay

## 2020-09-12 NOTE — Chronic Care Management (AMB) (Signed)
    Chronic Care Management Pharmacy Assistant   Name: DELONTA MALVEAUX  MRN: [id number] DOB: 08-Feb-1926   Reason for Encounter: Disease State General  Recent office visits:  None noted  Recent consult visits:  07/02/20-David Sevier [facility] (Otolaryngology) Seen for ear cleaning.  Hospital visits:  None in previous 6 months  Medications: Outpatient Encounter Medications as of 09/12/2020  Medication Sig   atorvastatin (LIPITOR) 40 MG tablet Take 1 tablet by mouth once daily   B Complex-C (SUPER B COMPLEX PO) Take 1 tablet by mouth daily.  (Patient not taking: Reported on 06/16/2020)   calcium carbonate (OSCAL) 1500 (600 Ca) MG TABS tablet Take 1 tablet by mouth daily.   ELIQUIS 2.5 MG TABS tablet Take 1 tablet by mouth twice daily   Emollient (CERAVE) CREA Apply topically. Applied to scalp as needed   Ginger, Zingiber officinalis, (GINGER PO) See admin instructions. Chew a small portion of ginger root as needed for indigestion   hydrochlorothiazide (HYDRODIURIL) 25 MG tablet Take 1 tablet by mouth once daily   latanoprost (XALATAN) 0.005 % ophthalmic solution  (Patient not taking: Reported on 06/16/2020)   metoprolol tartrate (LOPRESSOR) 25 MG tablet TAKE 1 TABLET BY MOUTH ONCE DAILY AS NEEDED. ONLY  USES  IF  NEEDED  FOR  TACHYCARDIA.  HAS  NOT  NEEDED  IN  OVER  A  YEAR (Patient not taking: Reported on 06/16/2020)   Multiple Vitamin (MULTI-VITAMIN) tablet Take by mouth.   nitroGLYCERIN (NITROSTAT) 0.4 MG SL tablet Place 1 tablet (0.4 mg total) under the tongue every 5 (five) minutes as needed for chest pain. (Patient not taking: Reported on 06/16/2020)   tamsulosin (FLOMAX) 0.4 MG CAPS capsule Take 1 capsule by mouth once daily   timolol (TIMOPTIC) 0.5 % ophthalmic solution INSTILL 1 DROP INTO EACH EYE TWICE DAILY   triamcinolone cream (KENALOG) 0.1 % APPLY  CREAM TOPICALLY TO AFFECTED AREA TWICE DAILY (Patient not taking: Reported on 06/16/2020)   No facility-administered encounter  medications on file as of 09/12/2020.   Have you had any problems recently with your health? Patient states he has been doing very well.  Have you had any problems with your pharmacy? Patient states he has no problems with his pharmacy.  What issues or side effects are you having with your medications? Patient states he has no issues or side effects to his medications.  What would you like me to pass along to Lois Huxley, CPP for them to help you with?  Patient states there is nothing at this time.  What can we do to take care of you better? Patient states there is nothing at this time.   Star Rating Drugs: Atorvastatin 40 mg Last filled:09/03/20 90 DS  Myriam Elta [place], [place]

## 2020-09-14 ENCOUNTER — Other Ambulatory Visit: Payer: Self-pay | Admitting: Internal Medicine

## 2020-09-20 ENCOUNTER — Ambulatory Visit: Payer: Medicare Other

## 2020-09-20 NOTE — Patient Instructions (Signed)
Health Maintenance, Male Adopting a healthy lifestyle and getting preventive care are important in promoting health and wellness. Ask your health care provider about: The right schedule for you to have regular tests and exams. Things you can do on your own to prevent diseases and keep yourself healthy. What should I know about diet, weight, and exercise? Eat a healthy diet  Eat a diet that includes plenty of vegetables, fruits, low-fat dairy products, and lean protein. Do not eat a lot of foods that are high in solid fats, added sugars, or sodium. Maintain a healthy weight Body mass index (BMI) is a measurement that can be used to identify possible weight problems. It estimates body fat based on height and weight. Your health care provider can help determine your BMI and help you achieve or maintain a healthy weight. Get regular exercise Get regular exercise. This is one of the most important things you can do for your health. Most adults should: Exercise for at least 150 minutes each week. The exercise should increase your heart rate and make you sweat (moderate-intensity exercise). Do strengthening exercises at least twice a week. This is in addition to the moderate-intensity exercise. Spend less time sitting. Even light physical activity can be beneficial. Watch cholesterol and blood lipids Have your blood tested for lipids and cholesterol at 85 years of age, then have this test every 5 years. You may need to have your cholesterol levels checked more often if: Your lipid or cholesterol levels are high. You are older than 85 years of age. You are at high risk for heart disease. What should I know about cancer screening? Many types of cancers can be detected early and may often be prevented. Depending on your health history and family history, you may need to have cancer screening at various ages. This may include screening for: Colorectal cancer. Prostate cancer. Skin cancer. Lung  cancer. What should I know about heart disease, diabetes, and high blood pressure? Blood pressure and heart disease High blood pressure causes heart disease and increases the risk of stroke. This is more likely to develop in people who have high blood pressure readings, are of African descent, or are overweight. Talk with your health care provider about your target blood pressure readings. Have your blood pressure checked: Every 3-5 years if you are 18-39 years of age. Every year if you are 40 years old or older. If you are between the ages of 65 and 75 and are a current or former smoker, ask your health care provider if you should have a one-time screening for abdominal aortic aneurysm (AAA). Diabetes Have regular diabetes screenings. This checks your fasting blood sugar level. Have the screening done: Once every three years after age 45 if you are at a normal weight and have a low risk for diabetes. More often and at a younger age if you are overweight or have a high risk for diabetes. What should I know about preventing infection? Hepatitis B If you have a higher risk for hepatitis B, you should be screened for this virus. Talk with your health care provider to find out if you are at risk for hepatitis B infection. Hepatitis C Blood testing is recommended for: Everyone born from 1945 through 1965. Anyone with known risk factors for hepatitis C. Sexually transmitted infections (STIs) You should be screened each year for STIs, including gonorrhea and chlamydia, if: You are sexually active and are younger than 85 years of age. You are older than 85 years   of age and your health care provider tells you that you are at risk for this type of infection. Your sexual activity has changed since you were last screened, and you are at increased risk for chlamydia or gonorrhea. Ask your health care provider if you are at risk. Ask your health care provider about whether you are at high risk for HIV.  Your health care provider may recommend a prescription medicine to help prevent HIV infection. If you choose to take medicine to prevent HIV, you should first get tested for HIV. You should then be tested every 3 months for as long as you are taking the medicine. Follow these instructions at home: Lifestyle Do not use any products that contain nicotine or tobacco, such as cigarettes, e-cigarettes, and chewing tobacco. If you need help quitting, ask your health care provider. Do not use street drugs. Do not share needles. Ask your health care provider for help if you need support or information about quitting drugs. Alcohol use Do not drink alcohol if your health care provider tells you not to drink. If you drink alcohol: Limit how much you have to 0-2 drinks a day. Be aware of how much alcohol is in your drink. In the [place] one drink equals one 12 oz bottle of beer (355 mL), one 5 oz glass of wine (148 mL), or one 1 oz glass of hard liquor (44 mL). General instructions Schedule regular health, dental, and eye exams. Stay current with your vaccines. Tell your health care provider if: You often feel depressed. You have ever been abused or do not feel safe at home. Summary Adopting a healthy lifestyle and getting preventive care are important in promoting health and wellness. Follow your health care provider's instructions about healthy diet, exercising, and getting tested or screened for diseases. Follow your health care provider's instructions on monitoring your cholesterol and blood pressure. This information is not intended to replace advice given to you by your health care provider. Make sure you discuss any questions you have with your health care provider. Document Revised: 03/07/2020 Document Reviewed: 12/21/2017 Elsevier Patient Education  2022 [employer]  

## 2020-09-20 NOTE — Patient Instructions (Addendum)
Health Maintenance, Male Adopting a healthy lifestyle and getting preventive care are important in promoting health and wellness. Ask your health care provider about: The right schedule for you to have regular tests and exams. Things you can do on your own to prevent diseases and keep yourself healthy. What should I know about diet, weight, and exercise? Eat a healthy diet  Eat a diet that includes plenty of vegetables, fruits, low-fat dairy products, and lean protein. Do not eat a lot of foods that are high in solid fats, added sugars, or sodium. Maintain a healthy weight Body mass index (BMI) is a measurement that can be used to identify possible weight problems. It estimates body fat based on height and weight. Your health care provider can help determine your BMI and help you achieve or maintain a healthy weight. Get regular exercise Get regular exercise. This is one of the most important things you can do for your health. Most adults should: Exercise for at least 150 minutes each week. The exercise should increase your heart rate and make you sweat (moderate-intensity exercise). Do strengthening exercises at least twice a week. This is in addition to the moderate-intensity exercise. Spend less time sitting. Even light physical activity can be beneficial. Watch cholesterol and blood lipids Have your blood tested for lipids and cholesterol at 85 years of age, then have this test every 5 years. You may need to have your cholesterol levels checked more often if: Your lipid or cholesterol levels are high. You are older than 85 years of age. You are at high risk for heart disease. What should I know about cancer screening? Many types of cancers can be detected early and may often be prevented. Depending on your health history and family history, you may need to have cancer screening at various ages. This may include screening for: Colorectal cancer. Prostate cancer. Skin cancer. Lung  cancer. What should I know about heart disease, diabetes, and high blood pressure? Blood pressure and heart disease High blood pressure causes heart disease and increases the risk of stroke. This is more likely to develop in people who have high blood pressure readings, are of African descent, or are overweight. Talk with your health care provider about your target blood pressure readings. Have your blood pressure checked: Every 3-5 years if you are 18-39 years of age. Every year if you are 40 years old or older. If you are between the ages of 65 and 75 and are a current or former smoker, ask your health care provider if you should have a one-time screening for abdominal aortic aneurysm (AAA). Diabetes Have regular diabetes screenings. This checks your fasting blood sugar level. Have the screening done: Once every three years after age 45 if you are at a normal weight and have a low risk for diabetes. More often and at a younger age if you are overweight or have a high risk for diabetes. What should I know about preventing infection? Hepatitis B If you have a higher risk for hepatitis B, you should be screened for this virus. Talk with your health care provider to find out if you are at risk for hepatitis B infection. Hepatitis C Blood testing is recommended for: Everyone born from 1945 through 1965. Anyone with known risk factors for hepatitis C. Sexually transmitted infections (STIs) You should be screened each year for STIs, including gonorrhea and chlamydia, if: You are sexually active and are younger than 85 years of age. You are older than 85 years   of age and your health care provider tells you that you are at risk for this type of infection. Your sexual activity has changed since you were last screened, and you are at increased risk for chlamydia or gonorrhea. Ask your health care provider if you are at risk. Ask your health care provider about whether you are at high risk for HIV.  Your health care provider may recommend a prescription medicine to help prevent HIV infection. If you choose to take medicine to prevent HIV, you should first get tested for HIV. You should then be tested every 3 months for as long as you are taking the medicine. Follow these instructions at home: Lifestyle Do not use any products that contain nicotine or tobacco, such as cigarettes, e-cigarettes, and chewing tobacco. If you need help quitting, ask your health care provider. Do not use street drugs. Do not share needles. Ask your health care provider for help if you need support or information about quitting drugs. Alcohol use Do not drink alcohol if your health care provider tells you not to drink. If you drink alcohol: Limit how much you have to 0-2 drinks a day. Be aware of how much alcohol is in your drink. In the [place] one drink equals one 12 oz bottle of beer (355 mL), one 5 oz glass of wine (148 mL), or one 1 oz glass of hard liquor (44 mL). General instructions Schedule regular health, dental, and eye exams. Stay current with your vaccines. Tell your health care provider if: You often feel depressed. You have ever been abused or do not feel safe at home. Summary Adopting a healthy lifestyle and getting preventive care are important in promoting health and wellness. Follow your health care provider's instructions about healthy diet, exercising, and getting tested or screened for diseases. Follow your health care provider's instructions on monitoring your cholesterol and blood pressure. This information is not intended to replace advice given to you by your health care provider. Make sure you discuss any questions you have with your health care provider. Document Revised: 03/07/2020 Document Reviewed: 12/21/2017 Elsevier Patient Education  2022 [place] Maintenance, Male Adopting a healthy lifestyle and getting preventive care are important in promoting health and  wellness. Ask your health care provider about: The right schedule for you to have regular tests and exams. Things you can do on your own to prevent diseases and keep yourself healthy. What should I know about diet, weight, and exercise? Eat a healthy diet  Eat a diet that includes plenty of vegetables, fruits, low-fat dairy products, and lean protein. Do not eat a lot of foods that are high in solid fats, added sugars, or sodium. Maintain a healthy weight Body mass index (BMI) is a measurement that can be used to identify possible weight problems. It estimates body fat based on height and weight. Your health care provider can help determine your BMI and help you achieve or maintain a healthy weight. Get regular exercise Get regular exercise. This is one of the most important things you can do for your health. Most adults should: Exercise for at least 150 minutes each week. The exercise should increase your heart rate and make you sweat (moderate-intensity exercise). Do strengthening exercises at least twice a week. This is in addition to the moderate-intensity exercise. Spend less time sitting. Even light physical activity can be beneficial. Watch cholesterol and blood lipids Have your blood tested for lipids and cholesterol at 85 years of age, then have  this test every 5 years. You may need to have your cholesterol levels checked more often if: Your lipid or cholesterol levels are high. You are older than 85 years of age. You are at high risk for heart disease. What should I know about cancer screening? Many types of cancers can be detected early and may often be prevented. Depending on your health history and family history, you may need to have cancer screening at various ages. This may include screening for: Colorectal cancer. Prostate cancer. Skin cancer. Lung cancer. What should I know about heart disease, diabetes, and high blood pressure? Blood pressure and heart disease High  blood pressure causes heart disease and increases the risk of stroke. This is more likely to develop in people who have high blood pressure readings, are of African descent, or are overweight. Talk with your health care provider about your target blood pressure readings. Have your blood pressure checked: Every 3-5 years if you are 78-59 years of age. Every year if you are 39 years old or older. If you are between the ages of 13 and 23 and are a current or former smoker, ask your health care provider if you should have a one-time screening for abdominal aortic aneurysm (AAA). Diabetes Have regular diabetes screenings. This checks your fasting blood sugar level. Have the screening done: Once every three years after age 64 if you are at a normal weight and have a low risk for diabetes. More often and at a younger age if you are overweight or have a high risk for diabetes. What should I know about preventing infection? Hepatitis B If you have a higher risk for hepatitis B, you should be screened for this virus. Talk with your health care provider to find out if you are at risk for hepatitis B infection. Hepatitis C Blood testing is recommended for: Everyone born from [birth year] through 1965. Anyone with known risk factors for hepatitis C. Sexually transmitted infections (STIs) You should be screened each year for STIs, including gonorrhea and chlamydia, if: You are sexually active and are younger than 85 years of age. You are older than 85 years of age and your health care provider tells you that you are at risk for this type of infection. Your sexual activity has changed since you were last screened, and you are at increased risk for chlamydia or gonorrhea. Ask your health care provider if you are at risk. Ask your health care provider about whether you are at high risk for HIV. Your health care provider may recommend a prescription medicine to help prevent HIV infection. If you choose to take medicine  to prevent HIV, you should first get tested for HIV. You should then be tested every 3 months for as long as you are taking the medicine. Follow these instructions at home: Lifestyle Do not use any products that contain nicotine or tobacco, such as cigarettes, e-cigarettes, and chewing tobacco. If you need help quitting, ask your health care provider. Do not use street drugs. Do not share needles. Ask your health care provider for help if you need support or information about quitting drugs. Alcohol use Do not drink alcohol if your health care provider tells you not to drink. If you drink alcohol: Limit how much you have to 0-2 drinks a day. Be aware of how much alcohol is in your drink. In the [place] one drink equals one 12 oz bottle of beer (355 mL), one 5 oz glass of wine (148 mL), or one  1 oz glass of hard liquor (44 mL). General instructions Schedule regular health, dental, and eye exams. Stay current with your vaccines. Tell your health care provider if: You often feel depressed. You have ever been abused or do not feel safe at home. Summary Adopting a healthy lifestyle and getting preventive care are important in promoting health and wellness. Follow your health care provider's instructions about healthy diet, exercising, and getting tested or screened for diseases. Follow your health care provider's instructions on monitoring your cholesterol and blood pressure. This information is not intended to replace advice given to you by your health care provider. Make sure you discuss any questions you have with your health care provider. Document Revised: 03/07/2020 Document Reviewed: 12/21/2017 Elsevier Patient Education  2022 [employer].

## 2020-09-22 DIAGNOSIS — Z23 Encounter for immunization: Secondary | ICD-10-CM | POA: Diagnosis not present

## 2020-10-16 ENCOUNTER — Other Ambulatory Visit: Payer: Self-pay

## 2020-10-16 ENCOUNTER — Ambulatory Visit (HOSPITAL_COMMUNITY)
Admission: RE | Admit: 2020-10-16 | Discharge: 2020-10-16 | Disposition: A | Discharge status: Home / Self Care | Payer: Medicare Other | Source: Ambulatory Visit | Attending: Internal Medicine | Admitting: Internal Medicine

## 2020-10-16 ENCOUNTER — Encounter (HOSPITAL_COMMUNITY): Payer: Self-pay | Admitting: Internal Medicine

## 2020-10-16 VITALS — BP 118/70 | HR 79 | Wt 151.8 lb

## 2020-10-16 DIAGNOSIS — Z79899 Other long term (current) drug therapy: Secondary | ICD-10-CM | POA: Insufficient documentation

## 2020-10-16 DIAGNOSIS — I2510 Atherosclerotic heart disease of native coronary artery without angina pectoris: Secondary | ICD-10-CM | POA: Diagnosis not present

## 2020-10-16 DIAGNOSIS — E785 Hyperlipidemia, unspecified: Secondary | ICD-10-CM | POA: Diagnosis not present

## 2020-10-16 DIAGNOSIS — I482 Chronic atrial fibrillation, unspecified: Secondary | ICD-10-CM | POA: Diagnosis not present

## 2020-10-16 DIAGNOSIS — I4821 Permanent atrial fibrillation: Secondary | ICD-10-CM

## 2020-10-16 DIAGNOSIS — I10 Essential (primary) hypertension: Secondary | ICD-10-CM | POA: Diagnosis not present

## 2020-10-16 DIAGNOSIS — I495 Sick sinus syndrome: Secondary | ICD-10-CM | POA: Diagnosis not present

## 2020-10-16 DIAGNOSIS — G629 Polyneuropathy, unspecified: Secondary | ICD-10-CM | POA: Insufficient documentation

## 2020-10-16 DIAGNOSIS — Z951 Presence of aortocoronary bypass graft: Secondary | ICD-10-CM | POA: Diagnosis not present

## 2020-10-16 DIAGNOSIS — Z7901 Long term (current) use of anticoagulants: Secondary | ICD-10-CM | POA: Diagnosis not present

## 2020-10-16 NOTE — Progress Notes (Signed)
CARDIOLOGY CLINIC NOTE  Patient ID: Larry Lopez, male   DOB: 02-11-1926, 87 y.o.   MRN: [id number]  HPI:  Larry Lopez ('Larry Lopez") is a 87 y.o. male  with a history of a coronary artery disease status post redo bypass surgery in 2002 by Dr. Cyndia Lopez.[name] He also has a history of hypertension and hyperlipidemia, PAF complicated by  tachy-brady syndrome with significant resting bradycardia prohibiting AV-Nodal blockade. Was hospitalized in 6/10 with rapid AF.  He saw Dr. Caryl Lopez previously for consideration of pacemaker but it was thought that as AF is infrequent and self-limiting and his bradycardia is asymptomatic  that we would watch it for now and avoid AV-nodal blockers due to bradycardia. If AF burden increased would then consider pacing and re-insitutuion of AV-nodal blockers.  On 02/06/13 underwent DC-CV for recurrent AF but quickly reverted back to AF.   He had Lexiscan Myoview 08/08/13 EF 56% Echo read as 40-45% (Dr. Haroldine Lopez reviewed personally and thought EF closer to 50-55%.  Had fall in 7/18 with splenic laceration Eliquis stopped for several weeks.   Here for routine f/u. Here with his wife. Says he is doing well. Not walking as much as his wife would like. Walks in the pool. No edema, orthopnea or PND. No bleeding with Eliquis.  Lab Results  Component Value Date   CHOL 134 04/22/2020   HDL 59.70 04/22/2020   LDLCALC 61 04/22/2020   TRIG 70.0 04/22/2020   CHOLHDL 2 04/22/2020    ROS: All systems negative except as listed in HPI, PMH and Problem List.  Past Medical History:  Diagnosis Date   Atrial fibrillation (HCC)    Bradycardia    CAD (coronary artery disease)    Glaucoma    HLD (hyperlipidemia)    HTN (hypertension)    Tachy-brady syndrome (HCC)     Current Outpatient Medications  Medication Sig Dispense Refill   atorvastatin (LIPITOR) 40 MG tablet Take 1 tablet by mouth once daily 90 tablet 0   B Complex-C (SUPER B COMPLEX PO) Take 1 tablet by mouth daily.      calcium carbonate (OSCAL) 1500 (600 Ca) MG TABS tablet Take 1 tablet by mouth daily.     ELIQUIS 2.5 MG TABS tablet Take 1 tablet by mouth twice daily 120 tablet 0   Emollient (CERAVE) CREA Apply topically. Applied to scalp as needed     Ginger, Zingiber officinalis, (GINGER PO) See admin instructions. Chew a small portion of ginger root as needed for indigestion     hydrochlorothiazide (HYDRODIURIL) 25 MG tablet Take 1 tablet by mouth once daily 90 tablet 0   latanoprost (XALATAN) 0.005 % ophthalmic solution      metoprolol tartrate (LOPRESSOR) 25 MG tablet TAKE 1 TABLET BY MOUTH ONCE DAILY AS NEEDED. ONLY  USES  IF  NEEDED  FOR  TACHYCARDIA.  HAS  NOT  NEEDED  IN  OVER  A  YEAR 90 tablet 0   Multiple Vitamin (MULTI-VITAMIN) tablet Take by mouth.     nitroGLYCERIN (NITROSTAT) 0.4 MG SL tablet Place 1 tablet (0.4 mg total) under the tongue every 5 (five) minutes as needed for chest pain. 25 tablet 6   timolol (TIMOPTIC) 0.5 % ophthalmic solution INSTILL 1 DROP INTO EACH EYE TWICE DAILY     triamcinolone cream (KENALOG) 0.1 % APPLY  CREAM TOPICALLY TO AFFECTED AREA TWICE DAILY 10 g 0   No current facility-administered medications for this encounter.    PHYSICAL EXAM: Vitals:   10/16/20  1500  BP: 118/70  Pulse: 79  SpO2: 98%  Weight: 68.9 kg (151 lb 12.8 oz)   Wt Readings from Last 3 Encounters:  10/16/20 68.9 kg (151 lb 12.8 oz)  04/22/20 69.6 kg (153 lb 6.4 oz)  04/19/19 70.6 kg (155 lb 9.6 oz)    General:  Well appearing. No resp difficulty HEENT: normal Neck: supple. no JVD. Carotids 2+ bilat; no bruits. No lymphadenopathy or thryomegaly appreciated. Cor: PMI nondisplaced. Irregular rate & rhythm. No rubs, gallops or murmurs. Lungs: clear Abdomen: soft, nontender, nondistended. No hepatosplenomegaly. No bruits or masses. Good bowel sounds. Extremities: no cyanosis, clubbing, rash, edema Neuro: alert & orientedx3, cranial nerves grossly intact. moves all 4 extremities w/o  difficulty. Affect pleasant  ECG: AF 76 No ST-T wave abnormalities. Personally reviewed   ASSESSMENT & PLAN:  1. Chronic AF - rate controlled. Well tolerated. No bleeding on Eliquis 2.5 nod 2. HTN  - Blood pressure well controlled. Continue current regimen. 3. H/o tachy brady syndrome stable - no indication for PPM  4. CAD s/p re-do CABG 2002 - no s/s ischemia - continue statin - off ASA with Eliquis 5. Hyperlipidemia  - followed by PCP - Goal LDL < 70. Continue statin  6. Neuropathy - Followed by Neurology  - No change  Larry Bickers, MD 3:06 PM

## 2020-10-16 NOTE — Addendum Note (Signed)
Encounter addended by: Scarlette Calico, RN on: 10/16/2020 3:32 PM  Actions taken: Clinical Note Signed

## 2020-10-16 NOTE — Patient Instructions (Signed)
Your physician recommends that you schedule a follow-up appointment in: 1 year, **PLEASE CALL us IN AUGUST 2023 TO SCHEDULE THIS APPOINTMENT

## 2020-11-12 ENCOUNTER — Telehealth: Payer: Self-pay

## 2020-11-12 NOTE — Progress Notes (Signed)
    Chronic Care Management Pharmacy Assistant   Name: Larry Lopez  MRN: [id number] DOB: 03/03/1926   Reason for Encounter: Disease State   Conditions to be addressed/monitored: General   Recent office visits:  None ID  Recent consult visits:  None ID  Hospital visits:  None in previous 6 months  Medications: Outpatient Encounter Medications as of 11/12/2020  Medication Sig   atorvastatin (LIPITOR) 40 MG tablet Take 1 tablet by mouth once daily   B Complex-C (SUPER B COMPLEX PO) Take 1 tablet by mouth daily.   calcium carbonate (OSCAL) 1500 (600 Ca) MG TABS tablet Take 1 tablet by mouth daily.   ELIQUIS 2.5 MG TABS tablet Take 1 tablet by mouth twice daily   Emollient (CERAVE) CREA Apply topically. Applied to scalp as needed   Ginger, Zingiber officinalis, (GINGER PO) See admin instructions. Chew a small portion of ginger root as needed for indigestion   hydrochlorothiazide (HYDRODIURIL) 25 MG tablet Take 1 tablet by mouth once daily   latanoprost (XALATAN) 0.005 % ophthalmic solution    metoprolol tartrate (LOPRESSOR) 25 MG tablet TAKE 1 TABLET BY MOUTH ONCE DAILY AS NEEDED. ONLY  USES  IF  NEEDED  FOR  TACHYCARDIA.  HAS  NOT  NEEDED  IN  OVER  A  YEAR   Multiple Vitamin (MULTI-VITAMIN) tablet Take by mouth.   nitroGLYCERIN (NITROSTAT) 0.4 MG SL tablet Place 1 tablet (0.4 mg total) under the tongue every 5 (five) minutes as needed for chest pain.   timolol (TIMOPTIC) 0.5 % ophthalmic solution INSTILL 1 DROP INTO EACH EYE TWICE DAILY   triamcinolone cream (KENALOG) 0.1 % APPLY  CREAM TOPICALLY TO AFFECTED AREA TWICE DAILY   No facility-administered encounter medications on file as of 11/12/2020.   Have you had any problems recently with your health? Spoke with patient wife Ivin Booty) who stated that the patient does not have any new health issues. She did mention that he is loosing his memory some but is not to concerned about that right now  Have you had any problems with  your pharmacy? She stated that he does not have any problems with getting medications or the cost of medications from the pharmacy  What issues or side effects are you having with your medications? She states that he does not have any side effects from medications  What would you like me to pass along to [facility] for them to help you with? She stated that patient is doing very well and walking and talking wonderfully. She does want to know if it will be ok if they take the 5 booster vaccine especially since he is 87yrs old. She stated that her grand-daughter had the booster and she started to experience chest pains, not saying that was the cause, she just wants to be sure   What can we do to take care of you better? She states that she loves the calls to check on patient.  Care Gaps: Colonoscopy-NA Diabetic Foot Exam-NA Ophthalmology-NA Dexa Scan - NA Annual Well Visit -NA Micro albumin-NA Hemoglobin A1c- NA  Star Rating Drugs: Atorvastatin 40 mg-last fill 09/03/20 90 ds   [place] Pharmacist Assistant [phone]

## 2020-12-16 ENCOUNTER — Other Ambulatory Visit: Payer: Self-pay

## 2020-12-16 ENCOUNTER — Ambulatory Visit: Payer: Medicare Other

## 2020-12-16 DIAGNOSIS — I10 Essential (primary) hypertension: Secondary | ICD-10-CM

## 2020-12-16 DIAGNOSIS — E782 Mixed hyperlipidemia: Secondary | ICD-10-CM

## 2020-12-16 NOTE — Progress Notes (Signed)
Chronic Care Management Pharmacy Note  12/16/2020 Name:  Larry Lopez MRN:  [id number] DOB:  1926/01/30  Summary: -Patient reports that he has been doing well since last visit, has not issues or concerns at this time -BP well controlled at most recently office visits, no changes to medications - no issues or concerns with current medications   Recommendations/Changes made from today's visit: -recommending no changes to medications at this time, advised for updated COVID booster as well as tdap vaccination if it has not been already updated with pharmacy - patient will update clinic with next visit   Plan: -f/u in 6 months    Subjective: Larry Lopez is an 87 y.o. year old male who is a primary patient of Hoyt Koch, MD.  The CCM team was consulted for assistance with disease management and care coordination needs.    Engaged with patient by telephone for follow up visit in response to provider referral for pharmacy case management and/or care coordination services.   Consent to Services:  The patient was given the following information about Chronic Care Management services today, agreed to services, and gave verbal consent: 1. CCM service includes personalized support from designated clinical staff supervised by the primary care provider, including individualized plan of care and coordination with other care providers 2. 24/7 contact phone numbers for assistance for urgent and routine care needs. 3. Service will only be billed when office clinical staff spend 20 minutes or more in a month to coordinate care. 4. Only one practitioner may furnish and bill the service in a calendar month. 5.The patient may stop CCM services at any time (effective at the end of the month) by phone call to the office staff. 6. The patient will be responsible for cost sharing (co-pay) of up to 20% of the service fee (after annual deductible is met). Patient agreed to services and consent  obtained.  Patient Care Team: Hoyt Koch, MD as PCP - General (Internal Medicine) Bensimhon, Shaune Pascal, MD (Cardiology) Shon Hough, MD (Ophthalmology) Alda Berthold, DO as Consulting Physician (Neurology) Delice Bison Darnelle Maffucci, [facility] as Pharmacist (Pharmacist)  Recent office visits: None since last visit   Recent consult visits: 10/16/2020 - Dr. Haroldine Laws - Cardiology - no changes to medications, f/u in 1 year  07/02/2020 - Pietro Cassis PA-C - otolaryngology - cerumen impaction of both [facility] visits: None in previous 6 months  Objective:  Lab Results  Component Value Date   CREATININE 1.02 04/22/2020   BUN 16 04/22/2020   GFR 64.17 04/22/2020   GFRNONAA >60 07/21/2016   GFRAA >60 07/21/2016   NA 140 04/22/2020   K 4.6 04/22/2020   CALCIUM 9.6 04/22/2020   CO2 28 04/22/2020   GLUCOSE 80 04/22/2020    Lab Results  Component Value Date/Time   HGBA1C 5.8 04/01/2015 02:21 PM   GFR 64.17 04/22/2020 03:07 PM   GFR 72.60 04/19/2019 11:48 AM    Last diabetic Eye exam:  No results found for: HMDIABEYEEXA  Last diabetic Foot exam:  No results found for: HMDIABFOOTEX   Lab Results  Component Value Date   CHOL 134 04/22/2020   HDL 59.70 04/22/2020   LDLCALC 61 04/22/2020   TRIG 70.0 04/22/2020   CHOLHDL 2 04/22/2020    Hepatic Function Latest Ref Rng & Units 04/22/2020 04/19/2019 09/05/2017  Total Protein 6.0 - 8.3 g/dL 7.0 6.8 6.5  Albumin 3.5 - 5.2 g/dL 4.0 4.2 4.0  AST 0 - 37  U/L '20 24 21  ' ALT 0 - 53 U/L '14 20 16  ' Alk Phosphatase 39 - 117 U/L 112 119(H) 125(H)  Total Bilirubin 0.2 - 1.2 mg/dL 1.2 1.1 1.3(H)  Bilirubin, Direct 0.0 - 0.3 mg/dL - - -    Lab Results  Component Value Date/Time   TSH 2.19 04/27/2016 01:59 PM   TSH 1.03 07/23/2008 11:55 AM    CBC Latest Ref Rng & Units 04/22/2020 04/19/2019 09/05/2017  WBC 4.0 - 10.5 K/uL 5.9 6.6 5.2  Hemoglobin 13.0 - 17.0 g/dL 15.4 15.4 14.5  Hematocrit 39.0 - 52.0 % 45.4 45.1 42.8  Platelets  150.0 - 400.0 K/uL 181.0 176.0 177.0    No results found for: VD25OH  Clinical ASCVD: Yes  The ASCVD Risk score (Arnett DK, et al., 2019) failed to calculate for the following reasons:   The 2019 ASCVD risk score is only valid for ages 86 to 67   The patient has a prior MI or stroke diagnosis    Depression screen [facility] 2/9 04/22/2020 10/06/2018 08/03/2017  Decreased Interest 0 0 0  Down, Depressed, Hopeless 0 1 1  PHQ - 2 Score 0 1 1  Altered sleeping - 0 0  Tired, decreased energy - 1 1  Change in appetite - 0 0  Feeling bad or failure about yourself  - 1 1  Trouble concentrating - - 0  Moving slowly or fidgety/restless - - 0  PHQ-9 Score - 3 3  Difficult doing work/chores - Not difficult at all Not difficult at all     Social History   Tobacco Use  Smoking Status Former   Types: Cigarettes   Quit date: 01/12/1967   Years since quitting: 53.9  Smokeless Tobacco Never   BP Readings from Last 3 Encounters:  10/16/20 118/70  04/22/20 116/72  04/19/19 128/78   Pulse Readings from Last 3 Encounters:  10/16/20 79  04/22/20 83  04/19/19 85   Wt Readings from Last 3 Encounters:  10/16/20 151 lb 12.8 oz (68.9 kg)  04/22/20 153 lb 6.4 oz (69.6 kg)  04/19/19 155 lb 9.6 oz (70.6 kg)   BMI Readings from Last 3 Encounters:  10/16/20 21.17 kg/m  04/22/20 21.39 kg/m  04/19/19 21.70 kg/m    Assessment/Interventions: Review of patient past medical history, allergies, medications, health status, including review of consultants reports, laboratory and other test data, was performed as part of comprehensive evaluation and provision of chronic care management services.   SDOH:  (Social Determinants of Health) assessments and interventions performed: Yes  SDOH Screenings   Alcohol Screen: Not on file  Depression (PHQ2-9): Low Risk    PHQ-2 Score: 0  Financial Resource Strain: Low Risk    Difficulty of Paying Living Expenses: Not hard at all  Food Insecurity: Not on file   Housing: Not on file  Physical Activity: Not on file  Social Connections: Not on file  Stress: Not on file  Tobacco Use: Medium Risk   Smoking Tobacco Use: Former   Smokeless Tobacco Use: Never   Passive Exposure: Not on file  Transportation Needs: Not on file    [place]  No Known Allergies  Medications Reviewed Today     Reviewed by Stanford Scotland, RN (Registered Nurse) on 10/16/20 at 1500  Med List Status: <None>   Medication Order Taking? Sig Documenting Provider Last Dose Status Informant  atorvastatin (LIPITOR) 40 MG tablet [id number] Yes Take 1 tablet by mouth once daily Bensimhon, Shaune Pascal, MD Taking  Active   B Complex-C (SUPER B COMPLEX PO) [id number] Yes Take 1 tablet by mouth daily. [provider] Taking Active   calcium carbonate (OSCAL) 1500 (600 Ca) MG TABS tablet [id number] Yes Take 1 tablet by mouth daily. [provider] Taking Active Self  ELIQUIS 2.5 MG TABS tablet [id number] Yes Take 1 tablet by mouth twice daily Bensimhon, Shaune Pascal, MD Taking Active   Emollient [facility]) [place] [id number] Yes Apply topically. Applied to scalp as needed [provider] Taking Active   Ginger, Zingiber officinalis, (GINGER PO) [id number] Yes See admin instructions. Chew a small portion of ginger root as needed for indigestion [provider] Taking Active Self  hydrochlorothiazide (HYDRODIURIL) 25 MG tablet [id number] Yes Take 1 tablet by mouth once daily Bensimhon, Shaune Pascal, MD Taking Active   latanoprost (XALATAN) 0.005 % ophthalmic solution [id number] Yes  [provider] Taking Active   metoprolol tartrate (LOPRESSOR) 25 MG tablet [id number] Yes TAKE 1 TABLET BY MOUTH ONCE DAILY AS NEEDED. ONLY  USES  IF  NEEDED  FOR  TACHYCARDIA.  HAS  NOT  NEEDED  IN  OVER  A  YEAR Bensimhon, Shaune Pascal, MD Taking Active   Multiple Vitamin (MULTI-VITAMIN) tablet [id number] Yes Take by mouth. [provider] Taking Active   nitroGLYCERIN  (NITROSTAT) 0.4 MG SL tablet [id number] Yes Place 1 tablet (0.4 mg total) under the tongue every 5 (five) minutes as needed for chest pain. Bensimhon, Shaune Pascal, MD Taking Active   timolol (TIMOPTIC) 0.5 % ophthalmic solution [id number] Yes INSTILL 1 DROP INTO [facility] EYE TWICE DAILY [provider] Taking Active   triamcinolone cream (KENALOG) 0.1 % [id number] Yes APPLY  CREAM TOPICALLY TO AFFECTED AREA TWICE DAILY Hoyt Koch, MD Taking Active             Patient Active Problem List   Diagnosis Date Noted   Memory change 04/24/2020   Rash 04/21/2019   Right inguinal hernia 09/05/2017   Urinary frequency 09/05/2017   Spleen hematoma 07/20/2016   Coronary artery disease involving native coronary artery 11/05/2013   Imbalance 11/05/2013   Routine health maintenance 08/22/2010   Hearing loss 08/22/2009   ATRIAL FIBRILLATION 06/27/2008   Hyperlipidemia 12/09/2006   Essential hypertension 12/09/2006   MYOCARDIAL INFARCTION, HX OF 12/09/2006   CAD (coronary artery disease) 12/09/2006    Immunization History  Administered Date(s) Administered   H1N1 12/25/2007   Influenza Split 09/12/2011   Influenza Whole 10/23/2008, 12/13/2013   Influenza, High Dose Seasonal PF 09/30/2017, 09/08/2018, 09/21/2018   Influenza-Unspecified 11/11/2012, 10/26/2014   PFIZER(Purple Top)SARS-COV-2 Vaccination 01/31/2019, 02/21/2019   Pneumococcal Conjugate-13 10/23/2012   Pneumococcal Polysaccharide-23 05/13/2003   Tdap 08/19/2010   Zoster Recombinat (Shingrix) 09/30/2017, 01/05/2018    Conditions to be addressed/monitored:  Hypertension, Hyperlipidemia, Atrial Fibrillation, BPH and glaucoma   There are no care plans that you recently modified to display for this patient.     Medication Assistance: None required.  Patient affirms current coverage meets needs.  Patient's preferred pharmacy is:  [facility], [address] [place] [address] Phone: [phone] Fax: [phone]  Uses pill box? No - able to manage without  Pt endorses 100% compliance  Care Plan and Follow Up Patient Decision:  Patient agrees to Care Plan and Follow-up.  Plan: Telephone follow up appointment with care management team member scheduled for:  6 months  and The patient has been provided with contact information  for the care management team and has been advised to call with any health related questions or concerns.   Tomasa Blase, PharmD Clinical Pharmacist, [place]

## 2020-12-16 NOTE — Patient Instructions (Signed)
Visit Information  Following are the goals we discussed today:   Track and Manage My Blood Pressures - HTN  Timeframe:  Long-Range Goal Priority:  High Start Date:   06/16/2020                          Expected End Date: 12/16/2021                      Follow Up Date 06/16/2021   - check blood pressure every 2 weeks   - choose a place to take my blood pressure (home, clinic or office, retail store) - write blood pressure results in a log or diary    Why is this important?   You won't feel high blood pressure, but it can still hurt your blood vessels.  High blood pressure can cause heart or kidney problems. It can also cause a stroke.  Making lifestyle changes like losing a little weight or eating less salt will help.  Checking your blood pressure at home and at different times of the day can help to control blood pressure.  If the doctor prescribes medicine remember to take it the way the doctor ordered.  Call the office if you cannot afford the medicine or if there are questions about it.     Notes: Patient will reach out to office should blood pressures become uncontrolled   Plan: Telephone follow up appointment with care management team member scheduled for:  6 months  The patient has been provided with contact information for the care management team and has been advised to call with any health related questions or concerns.   Tomasa Blase, PharmD Clinical Pharmacist, [facility]   Please call the care guide team at [phone] if you need to cancel or reschedule your appointment.   Patient verbalizes understanding of instructions provided today and agrees to view in [place].

## 2020-12-26 ENCOUNTER — Other Ambulatory Visit (HOSPITAL_COMMUNITY): Payer: Self-pay | Admitting: Internal Medicine

## 2020-12-29 ENCOUNTER — Other Ambulatory Visit (HOSPITAL_COMMUNITY): Payer: Self-pay | Admitting: Internal Medicine

## 2021-01-06 DIAGNOSIS — Z23 Encounter for immunization: Secondary | ICD-10-CM | POA: Diagnosis not present

## 2021-01-26 ENCOUNTER — Other Ambulatory Visit: Payer: Self-pay | Admitting: Internal Medicine

## 2021-01-26 ENCOUNTER — Other Ambulatory Visit (HOSPITAL_COMMUNITY): Payer: Self-pay | Admitting: Internal Medicine

## 2021-01-30 DIAGNOSIS — Z85828 Personal history of other malignant neoplasm of skin: Secondary | ICD-10-CM | POA: Diagnosis not present

## 2021-01-30 DIAGNOSIS — D044 Carcinoma in situ of skin of scalp and neck: Secondary | ICD-10-CM | POA: Diagnosis not present

## 2021-01-30 DIAGNOSIS — L570 Actinic keratosis: Secondary | ICD-10-CM | POA: Diagnosis not present

## 2021-01-30 DIAGNOSIS — C44622 Squamous cell carcinoma of skin of right upper limb, including shoulder: Secondary | ICD-10-CM | POA: Diagnosis not present

## 2021-01-30 DIAGNOSIS — D485 Neoplasm of uncertain behavior of skin: Secondary | ICD-10-CM | POA: Diagnosis not present

## 2021-01-30 DIAGNOSIS — C44519 Basal cell carcinoma of skin of other part of trunk: Secondary | ICD-10-CM | POA: Diagnosis not present

## 2021-01-30 DIAGNOSIS — L821 Other seborrheic keratosis: Secondary | ICD-10-CM | POA: Diagnosis not present

## 2021-02-03 ENCOUNTER — Ambulatory Visit (INDEPENDENT_AMBULATORY_CARE_PROVIDER_SITE_OTHER): Payer: Medicare Other | Admitting: Internal Medicine

## 2021-02-03 ENCOUNTER — Encounter: Payer: Self-pay | Admitting: Internal Medicine

## 2021-02-03 ENCOUNTER — Other Ambulatory Visit: Payer: Self-pay

## 2021-02-03 VITALS — BP 122/82 | HR 70 | Resp 18 | Ht 71.0 in | Wt 159.0 lb

## 2021-02-03 DIAGNOSIS — I4821 Permanent atrial fibrillation: Secondary | ICD-10-CM

## 2021-02-03 DIAGNOSIS — R413 Other amnesia: Secondary | ICD-10-CM

## 2021-02-03 DIAGNOSIS — I10 Essential (primary) hypertension: Secondary | ICD-10-CM | POA: Diagnosis not present

## 2021-02-03 DIAGNOSIS — Z0000 Encounter for general adult medical examination without abnormal findings: Secondary | ICD-10-CM | POA: Diagnosis not present

## 2021-02-03 DIAGNOSIS — E782 Mixed hyperlipidemia: Secondary | ICD-10-CM | POA: Diagnosis not present

## 2021-02-03 DIAGNOSIS — H9193 Unspecified hearing loss, bilateral: Secondary | ICD-10-CM | POA: Diagnosis not present

## 2021-02-03 DIAGNOSIS — R2689 Other abnormalities of gait and mobility: Secondary | ICD-10-CM | POA: Diagnosis not present

## 2021-02-03 DIAGNOSIS — R350 Frequency of micturition: Secondary | ICD-10-CM | POA: Diagnosis not present

## 2021-02-03 LAB — COMPREHENSIVE METABOLIC PANEL
ALT: 15 U/L (ref 0–53)
AST: 18 U/L (ref 0–37)
Albumin: 4.1 g/dL (ref 3.5–5.2)
Alkaline Phosphatase: 93 U/L (ref 39–117)
BUN: 14 mg/dL (ref 6–23)
CO2: 32 mEq/L (ref 19–32)
Calcium: 9.4 mg/dL (ref 8.4–10.5)
Chloride: 103 mEq/L (ref 96–112)
Creatinine, Ser: 0.95 mg/dL (ref 0.40–1.50)
GFR: 69.5 mL/min (ref >60.00)
Glucose, Bld: 93 mg/dL (ref 70–99)
Potassium: 3.8 mEq/L (ref 3.5–5.1)
Sodium: 141 mEq/L (ref 135–145)
Total Bilirubin: 1.1 mg/dL (ref 0.2–1.2)
Total Protein: 6.9 g/dL (ref 6.0–8.3)

## 2021-02-03 LAB — CBC
HCT: 42.6 % (ref 39.0–52.0)
Hemoglobin: 14.2 g/dL (ref 13.0–17.0)
MCHC: 33.4 g/dL (ref 30.0–36.0)
MCV: 92.8 fl (ref 78.0–100.0)
Platelets: 180 10*3/uL (ref 150.0–400.0)
RBC: 4.59 Mil/uL (ref 4.22–5.81)
RDW: 14.4 % (ref 11.5–15.5)
WBC: 5.4 10*3/uL (ref 4.0–10.5)

## 2021-02-03 LAB — LIPID PANEL
Cholesterol: 199 mg/dL (ref 0–200)
HDL: 53.1 mg/dL (ref >39.00)
LDL Cholesterol: 116 mg/dL — ABNORMAL HIGH (ref 0–99)
NonHDL: 146.3
Total CHOL/HDL Ratio: 4
Triglycerides: 151 mg/dL — ABNORMAL HIGH (ref 0.0–149.0)
VLDL: 30.2 mg/dL (ref 0.0–40.0)

## 2021-02-03 MED ORDER — DONEPEZIL HCL 5 MG PO TABS: 5.0000 mg | ORAL_TABLET | Freq: Every day | ORAL | 3 refills | Status: DC

## 2021-02-03 MED ORDER — TAMSULOSIN HCL 0.4 MG PO CAPS: 0.4000 mg | ORAL_CAPSULE | Freq: Every day | ORAL | 3 refills | Status: DC

## 2021-02-03 NOTE — Assessment & Plan Note (Signed)
Flu shot up to date. Covid-19 booster encouraged. Pneumonia complete. Shingrix complete. Tetanus due advised to get at pharmacy. Colonoscopy aged out. Counseled about sun safety and mole surveillance. Counseled about the dangers of distracted driving. Given 10 year screening recommendations.

## 2021-02-03 NOTE — Assessment & Plan Note (Signed)
Checking CMP, BP at goal on hctz 25 mg daily and metoprolol 25 mg prn (not taking often). Adjust as needed.

## 2021-02-03 NOTE — Assessment & Plan Note (Signed)
Referral to neuro rehab for PT to help improve balance and offer recommendations for if cane or walker might help with balance.

## 2021-02-03 NOTE — Assessment & Plan Note (Signed)
Is on eliquis for stroke prevention and sounds regular today.

## 2021-02-03 NOTE — Assessment & Plan Note (Signed)
Has progressed since last year. CT head done last year with atrophy and discussed again the implications of this. He is struggling with finances and rarely drives due to safety concerns. Rx aricept 5 mg daily today. Discussed that this is not likely to improve memory but to help avoid progression.

## 2021-02-03 NOTE — Assessment & Plan Note (Signed)
Checking lipid panel and adjust lipitor 40 mg daily as needed. 

## 2021-02-03 NOTE — Assessment & Plan Note (Signed)
Using bilateral aids with relief.

## 2021-02-03 NOTE — Progress Notes (Signed)
Subjective:   Patient ID: Larry Lopez, male    DOB: 25-Jun-1926, 88 y.o.   MRN: [id number]  HPI Here for medicare wellness, and follow up as well. Please see A/P for status and treatment of chronic medical problems.   Diet: heart healthy Physical activity: sedentary Depression/mood screen: negative Hearing: intact to whispered voice, bilateral aids Visual acuity: grossly normal with lens, performs annual eye exam  ADLs: capable Fall risk: medium Home safety: good Cognitive evaluation: intact to orientation, naming, recall and repetition EOL planning: adv directives discussed, in place  [employer] Visit from 04/22/2020 in [place] at [facility]  PHQ-2 Total Score 0       [place] from 10/06/2018 in [place]  PHQ-9 Total Score 3      Fall Risk 05/11/2015 07/19/2016 08/03/2017 10/06/2018 04/22/2020  Falls in the past year? No Yes Yes 0 0  Was there an injury with Fall? - Yes Yes 0 0  Was there an injury with Fall? - back injury - - -  Fall Risk Category Calculator - - - 0 0  Fall Risk Category - - - Low Low  Patient Fall Risk Level - - - Low fall risk -  Patient at Risk for Falls Due to - - Impaired balance/gait;Impaired mobility Impaired balance/gait -  Fall risk Follow up - - Falls prevention discussed;Education provided - -    I have personally reviewed and have noted 1. The patient's medical and social history - reviewed today no changes 2. Their use of alcohol, tobacco or illicit drugs 3. Their current medications and supplements 4. The patient's functional ability including ADL's, fall risks, home safety risks and hearing or visual impairment. 5. Diet and physical activities 6. Evidence for depression or mood disorders 7. Care team reviewed and updated 8.  The patient is not on an opioid pain medication.  Patient Care Team: Hoyt Koch, MD as PCP - General (Internal  Medicine) Bensimhon, Shaune Pascal, MD (Cardiology) Shon Hough, MD (Ophthalmology) Alda Berthold, DO as Consulting Physician (Neurology) Delice Bison Darnelle Maffucci, [facility] as Pharmacist (Pharmacist) Past Medical History:  Diagnosis Date   Atrial fibrillation [facility])    Bradycardia    CAD (coronary artery disease)    Glaucoma    HLD (hyperlipidemia)    HTN (hypertension)    Tachy-brady syndrome [facility])    Past Surgical History:  Procedure Laterality Date   CARDIAC SURGERY     CARDIOVERSION N/A 02/06/2013   Procedure: CARDIOVERSION;  Surgeon: Jolaine Artist, MD;  Location[facility] ENDOSCOPY;  Service: Cardiovascular;  Laterality: N/A;   CORONARY ARTERY BYPASS GRAFT     TONSILLECTOMY AND ADENOIDECTOMY     Family History  Problem Relation Age of Onset   Cancer Father        Deceased, 12   Breast cancer Mother        Deceased, 74   Breast cancer Sister    Colon cancer Daughter        Living   Diabetes Neg Hx    Coronary artery disease Neg Hx    Review of Systems  Constitutional: Negative.   HENT: Negative.    Eyes: Negative.   Respiratory:  Negative for cough, chest tightness and shortness of breath.   Cardiovascular:  Negative for chest pain, palpitations and leg swelling.  Gastrointestinal:  Negative for abdominal distention, abdominal pain, constipation, diarrhea, nausea and vomiting.  Genitourinary:  Positive for frequency.  Musculoskeletal:  Positive for gait problem. Negative for arthralgias, myalgias and neck pain.  Skin: Negative.   Neurological:        Memory changes  Psychiatric/Behavioral: Negative.     Objective:  Physical Exam Constitutional:      Appearance: He is well-developed.  HENT:     Head: Normocephalic and atraumatic.  Cardiovascular:     Rate and Rhythm: Normal rate and regular rhythm.  Pulmonary:     Effort: Pulmonary effort is normal. No respiratory distress.     Breath sounds: Normal breath sounds. No wheezing or rales.  Abdominal:     General: Bowel  sounds are normal. There is no distension.     Palpations: Abdomen is soft.     Tenderness: There is no abdominal tenderness. There is no rebound.  Musculoskeletal:     Cervical back: Normal range of motion.  Skin:    General: Skin is warm and dry.  Neurological:     Mental Status: He is alert and oriented to person, place, and time.     Coordination: Coordination abnormal.     Comments: Slightly unstable gait    Vitals:   02/03/21 0759  BP: 122/82  Pulse: 70  Resp: 18  SpO2: 98%  Weight: 159 lb (72.1 kg)  Height: 5\' 11"  (1.803 m)   This visit occurred during the SARS-CoV-2 public health emergency.  Safety protocols were in place, including screening questions prior to the visit, additional usage of staff PPE, and extensive cleaning of exam room while observing appropriate contact time as indicated for disinfecting solutions.   Assessment & Plan:

## 2021-02-03 NOTE — Assessment & Plan Note (Signed)
Did well with flomax but ran out recently due to lack of refills. Rx flomax 0.4 mg qhs today.

## 2021-02-03 NOTE — Patient Instructions (Signed)
We have sent in aricept to take 1 pill daily.

## 2021-02-12 ENCOUNTER — Other Ambulatory Visit: Payer: Self-pay

## 2021-02-12 ENCOUNTER — Ambulatory Visit: Payer: Medicare Other | Attending: Internal Medicine | Admitting: Physical Therapy

## 2021-02-12 DIAGNOSIS — R262 Difficulty in walking, not elsewhere classified: Secondary | ICD-10-CM

## 2021-02-12 DIAGNOSIS — R296 Repeated falls: Secondary | ICD-10-CM | POA: Diagnosis not present

## 2021-02-12 DIAGNOSIS — M6281 Muscle weakness (generalized): Secondary | ICD-10-CM | POA: Diagnosis not present

## 2021-02-12 DIAGNOSIS — R2681 Unsteadiness on feet: Secondary | ICD-10-CM

## 2021-02-12 NOTE — Therapy (Signed)
[place] [place], [place], [address] Phone: [phone]   Fax:  [phone]  Physical Therapy Evaluation  Patient Details  Name: Larry Lopez MRN: [id number] Date of Birth: 1926-09-30 Referring Provider (PT): Pricilla Holm   Encounter Date: 02/12/2021   PT End of Session - 02/12/21 0951     Visit Number 1    Number of Visits 17    Date for PT Re-Evaluation 04/09/21    Authorization Type MCR and BCBS    Authorization Time Period 02/12/21 to 04/09/21    Progress Note Due on Visit 10    PT Start Time 0846    PT Stop Time 0930    PT Time Calculation (min) 44 min    Activity Tolerance Patient tolerated treatment well    Behavior During Therapy [facility] for tasks assessed/performed;Agitated             Past Medical History:  Diagnosis Date   Atrial fibrillation ([place])    Bradycardia    CAD (coronary artery disease)    Glaucoma    HLD (hyperlipidemia)    HTN (hypertension)    Tachy-brady syndrome ([place])     Past Surgical History:  Procedure Laterality Date   CARDIAC SURGERY     CARDIOVERSION N/A 02/06/2013   Procedure: CARDIOVERSION;  Surgeon: Jolaine Artist, MD;  Location: [facility] ENDOSCOPY;  Service: Cardiovascular;  Laterality: N/A;   CORONARY ARTERY BYPASS GRAFT     TONSILLECTOMY AND ADENOIDECTOMY      There were no vitals filed for this visit.    Subjective Assessment - 02/12/21 0849     Subjective I feel very unsteady, this started in the past few months. I've had 2 falls in the past month, one was going up a step and the other was just carrying items over level surface. Gait has been going downhill for awhile, shuffling has gotten worse. No numbness in feet. Have quite a cardiac history.    Patient Stated Goals better balance, no more falls    Currently in Pain? No/denies                [facility] PT Assessment - 02/12/21 0001       Assessment   Medical Diagnosis imbalance     Referring Provider (PT) Pricilla Holm    Onset Date/Surgical Date --   within the past 6 months   Next MD Visit Dr. Sharlet Salina every 6 months    Prior Therapy PT here after heart issues      Precautions   Precautions Fall    Precaution Comments some stm issues per spouse, HOH      Restrictions   Weight Bearing Restrictions No      Balance Screen   Has the patient fallen in the past 6 months Yes    How many times? 2    Has the patient had a decrease in activity level because of a fear of falling?  Yes    Is the patient reluctant to leave their home because of a fear of falling?  No      Home Ecologist residence      Prior Function   Level of Independence Independent;Independent with basic ADLs    Vocation Retired    Leisure reading, getting on the computer, playing cards with wife      Observation/Other Assessments   Focus on Therapeutic Outcomes (FOTO)  not in system  ROM / Strength   AROM / PROM / Strength Strength      Strength   Overall Strength Comments resisted bridge 4/5    Strength Assessment Site Hip;Knee;Ankle    Right/Left Hip Right;Left    Right Hip Flexion 3+/5    Right Hip ABduction 4/5    Left Hip Flexion 3+/5    Left Hip ABduction 4/5    Right/Left Knee Right;Left    Right Knee Extension 4+/5    Left Knee Extension 4+/5    Right/Left Ankle Right;Left    Right Ankle Dorsiflexion 4+/5    Left Ankle Dorsiflexion 4+/5      Standardized Balance Assessment   Standardized Balance Assessment Berg Balance Test      Berg Balance Test   Sit to Stand Able to stand without using hands and stabilize independently    Standing Unsupported Able to stand safely 2 minutes    Sitting with Back Unsupported but Feet Supported on Floor or Stool Able to sit safely and securely 2 minutes    Stand to Sit Sits safely with minimal use of hands    Transfers Able to transfer safely, minor use of hands    Standing Unsupported with Eyes  Closed Able to stand 10 seconds with supervision    Standing Unsupported with Feet Together Able to place feet together independently and stand 1 minute safely    From Standing, Reach Forward with Outstretched Arm Reaches forward but needs supervision    From Standing Position, Pick up Object from Floor Able to pick up shoe safely and easily    From Standing Position, Turn to Look Behind Over each Shoulder Turn sideways only but maintains balance    Turn 360 Degrees Needs close supervision or verbal cueing    Standing Unsupported, Alternately Place Feet on Step/Stool Needs assistance to keep from falling or unable to try    Standing Unsupported, One Foot in [employer] balance while stepping or standing    Standing on One Leg Unable to try or needs assist to prevent fall    Total Score 35                        Objective measurements completed on examination: See above findings.       [facility] Adult PT Treatment/Exercise - 02/12/21 0001       Ambulation/Gait   Gait Comments flexed at hips, tends to shuffle and has limited step /stride length, limited heel toe pattern; practiced gait training with tripod cane, SBQC, and RW with min-mod cues for sequenicng.                     PT Education - 02/12/21 0950     Education Details exam findings, POC, HEP; benefits of temporary use of AD to reduce fall risk until PT can improve strength/balance sufficiently, tripod cane vs hurricane and local DME stores    Person(s) Educated Patient;Spouse    Methods Explanation;Demonstration    Comprehension Verbalized understanding;Need further instruction;Returned demonstration              PT Short Term Goals - 02/12/21 0954       PT SHORT TERM GOAL #1   Title Will be compliant with appropriate HEP to be progressed PRN    Time 4    Period Weeks    Status New    Target Date 03/12/21      PT SHORT TERM GOAL #2  Title Will be able to name 3 ways to reduce fall risk  at home and in community    Baseline (for example good lighting, good footwear, use of LRAD, etc)    Time 4    Period Weeks    Status New      PT SHORT TERM GOAL #3   Title Will be able to maintain tandem stance for at least 15 seconds and single leg stance for at least 5 seconds B to show improved functional balance    Time 4    Period Weeks    Status New      PT SHORT TERM GOAL #4   Title Gait pattern to have significantly improved to include WNL step/stride lengths, good heel toe mechanics and foot clearance, and only occasional shuffling pattern with and without LRAD    Time 4    Period Weeks    Status New               PT Long Term Goals - 02/12/21 2836       PT LONG TERM GOAL #1   Title MMT to improve by at least 1 grade in all weak groups    Time 8    Period Weeks    Status New    Target Date 04/09/21      PT LONG TERM GOAL #2   Title Berg balance score to improve to at least 45 to show reduced fall risk/improved functional balance skills    Time 8    Period Weeks    Status New      PT LONG TERM GOAL #3   Title Will be able to ascend and descend at least 4 steps with no UE support and step over step pattern with no more than min guard assist to improve community access    Time 8    Period Weeks    Status New      PT LONG TERM GOAL #4   Title Will be able to ambulate at least 568ft on indoor and outdoor surfaces with no more than supervision assist and no significant LOB    Time 8    Period Weeks    Status New                    Plan - 02/12/21 6294     Clinical Impression Statement Mr. Mccallum arrives today with his wife, who is very supportive and encouraging; they tell me they are very concerned about his balance and falls- he has had 2 in the past month and his gait has gotten progressively worse over the past year. Exam reveals significant gait impairment, functional muscle weakness, and very poor balance with a Berg score of 35/56. We did  spend quite a bit of time discussing benefits of temporarily using assistive device to reduce fall risk until PT can improve strength and balance, also practiced using tripod cane, quad cane, and RW. He did become agitated with me when discussing recommendation for temporary AD (did best with tripod cane today, discussed this device and DME stores for pickup) and needed frequent redirection; spouse understanding and supportive of this recommendation however. Will benefit from skilled PT services to improve functional activity tolerance, strength, and to reduce fall risk moving forward.    Personal Factors and Comorbidities Age;Fitness;Behavior Pattern;Social Background;Time since onset of injury/illness/exacerbation    Examination-Activity Limitations Locomotion Level;Reach Overhead;Bend;Squat;Stairs;Stand    Examination-Participation Restrictions Church;Community Activity;Interpersonal Relationship;Shop;Yard Work;Volunteer    Stability/Clinical  Decision Making Stable/Uncomplicated    Clinical Decision Making Low    Rehab Potential Good    PT Frequency 2x / week    PT Duration 8 weeks    PT Treatment/Interventions ADLs/Self Care Home Management;Cryotherapy;Electrical Stimulation;Iontophoresis 4mg /ml Dexamethasone;Moist Heat;Ultrasound;DME Instruction;Gait training;Stair training;Functional mobility training;Therapeutic activities;Therapeutic exercise;Balance training;Neuromuscular re-education;Cognitive remediation;Patient/family education;Manual techniques;Energy conservation    PT Next Visit Plan still needs HEP; focus on strength and balance, continue gait training with [place] did not have time to assign at eval    Consulted and Agree with Plan of Care Family member/caregiver;Patient    Family Member Consulted spouse             Patient will benefit from skilled therapeutic intervention in order to improve the following deficits and impairments:  Abnormal gait,  Difficulty walking, Decreased endurance, Decreased safety awareness, Decreased activity tolerance, Decreased balance, Decreased knowledge of use of DME, Impaired flexibility, Decreased mobility, Decreased strength, Postural dysfunction  Visit Diagnosis: Unsteadiness on feet  Muscle weakness (generalized)  Repeated falls  Difficulty in walking, not elsewhere classified     Problem List Patient Active Problem List   Diagnosis Date Noted   Memory change 04/24/2020   Right inguinal hernia 09/05/2017   Urinary frequency 09/05/2017   Coronary artery disease involving native coronary artery 11/05/2013   Imbalance 11/05/2013   Routine health maintenance 08/22/2010   Hearing loss 08/22/2009   ATRIAL FIBRILLATION 06/27/2008   Hyperlipidemia 12/09/2006   Essential hypertension 12/09/2006   MYOCARDIAL INFARCTION, HX OF 12/09/2006   CAD (coronary artery disease) 12/09/2006   Ann Lions PT, DPT, PN2   Supplemental Physical Therapist [place]. [place], [place], [address] Phone: [phone]   Fax:  [phone]  Name: EVERADO PILLSBURY MRN: [id number] Date of Birth: Sep 11, 1926

## 2021-02-17 DIAGNOSIS — H401133 Primary open-angle glaucoma, bilateral, severe stage: Secondary | ICD-10-CM | POA: Diagnosis not present

## 2021-02-18 ENCOUNTER — Ambulatory Visit: Payer: Medicare Other | Admitting: Physical Therapy

## 2021-02-18 ENCOUNTER — Encounter: Payer: Self-pay | Admitting: Physical Therapy

## 2021-02-18 ENCOUNTER — Other Ambulatory Visit: Payer: Self-pay

## 2021-02-18 DIAGNOSIS — R2681 Unsteadiness on feet: Secondary | ICD-10-CM

## 2021-02-18 DIAGNOSIS — M6281 Muscle weakness (generalized): Secondary | ICD-10-CM | POA: Diagnosis not present

## 2021-02-18 DIAGNOSIS — R262 Difficulty in walking, not elsewhere classified: Secondary | ICD-10-CM | POA: Diagnosis not present

## 2021-02-18 DIAGNOSIS — R296 Repeated falls: Secondary | ICD-10-CM | POA: Diagnosis not present

## 2021-02-18 NOTE — Therapy (Signed)
[place] [address]. [place], [place], [address] Phone: [phone]   Fax:  [phone]  Physical Therapy Treatment  Patient Details  Name: Larry Lopez MRN: [id number] Date of Birth: [birth year] Referring Provider (PT): Pricilla Holm   Encounter Date: 02/18/2021   PT End of Session - 02/18/21 0927     Visit Number 2    Date for PT Re-Evaluation 04/09/21    PT Start Time 0845    PT Stop Time 0929    PT Time Calculation (min) 44 min    Activity Tolerance Patient tolerated treatment well    Behavior During Therapy [facility] for tasks assessed/performed             Past Medical History:  Diagnosis Date   Atrial fibrillation ([place])    Bradycardia    CAD (coronary artery disease)    Glaucoma    HLD (hyperlipidemia)    HTN (hypertension)    Tachy-brady syndrome ([place])     Past Surgical History:  Procedure Laterality Date   CARDIAC SURGERY     CARDIOVERSION N/A 02/06/2013   Procedure: CARDIOVERSION;  Surgeon: Jolaine Artist, MD;  Location: [facility] ENDOSCOPY;  Service: Cardiovascular;  Laterality: N/A;   CORONARY ARTERY BYPASS GRAFT     TONSILLECTOMY AND ADENOIDECTOMY      There were no vitals filed for this visit.   Subjective Assessment - 02/18/21 0847     Subjective "Good" Pt enters with clinic with [facility]    Currently in Pain? No/denies                               [facility] Adult PT Treatment/Exercise - 02/18/21 0001       Ambulation/Gait   Ambulation/Gait Yes    Ambulation/Gait Assistance 5: Supervision    Ambulation Distance (Feet) 275 Feet    Assistive device Small based quad cane    Gait Pattern Step-through pattern;Decreased arm swing - right      High Level Balance   High Level Balance Activities Side stepping   with a marching combo     Exercises   Exercises Knee/Hip;Lumbar      Lumbar Exercises: Aerobic   Nustep L4 x 6 min      Knee/Hip Exercises: Standing   Forward  Step Up Both;2 sets;5 sets;Hand Hold: 0;Hand Hold: 1;Step Height: 4"    Walking with Sports Cord 30lb forward/backward, 20lb side steps x3 each    Other Standing Knee Exercises Alt Box taps 6in 2x10      Knee/Hip Exercises: Seated   Sit to Sand 2 sets;10 reps;without UE support                       PT Short Term Goals - 02/12/21 0954       PT SHORT TERM GOAL #1   Title Will be compliant with appropriate HEP to be progressed PRN    Time 4    Period Weeks    Status New    Target Date 03/12/21      PT SHORT TERM GOAL #2   Title Will be able to name 3 ways to reduce fall risk at home and in community    Baseline (for example good lighting, good footwear, use of LRAD, etc)    Time 4    Period Weeks    Status New      PT SHORT TERM  GOAL #3   Title Will be able to maintain tandem stance for at least 15 seconds and single leg stance for at least 5 seconds B to show improved functional balance    Time 4    Period Weeks    Status New      PT SHORT TERM GOAL #4   Title Gait pattern to have significantly improved to include WNL step/stride lengths, good heel toe mechanics and foot clearance, and only occasional shuffling pattern with and without LRAD    Time 4    Period Weeks    Status New               PT Long Term Goals - 02/12/21 8850       PT LONG TERM GOAL #1   Title MMT to improve by at least 1 grade in all weak groups    Time 8    Period Weeks    Status New    Target Date 04/09/21      PT LONG TERM GOAL #2   Title Berg balance score to improve to at least 45 to show reduced fall risk/improved functional balance skills    Time 8    Period Weeks    Status New      PT LONG TERM GOAL #3   Title Will be able to ascend and descend at least 4 steps with no UE support and step over step pattern with no more than min guard assist to improve community access    Time 8    Period Weeks    Status New      PT LONG TERM GOAL #4   Title Will be able to  ambulate at least 574ft on indoor and outdoor surfaces with no more than supervision assist and no significant LOB    Time 8    Period Weeks    Status New                   Plan - 02/18/21 2774     Clinical Impression Statement Pt enters clinic with [facility] wanting to learn how to uses it . Pt given detailed demonstration and explanation of SBQC use. He can maintain pattern but often get off track reaching too far with cane. Cue needed to control the eccentric phase of gait.    Personal Factors and Comorbidities Age;Fitness;Behavior Pattern;Social Background;Time since onset of injury/illness/exacerbation    Examination-Activity Limitations Locomotion Level;Reach Overhead;Bend;Squat;Stairs;Stand    Examination-Participation Restrictions Church;Community Activity;Interpersonal Relationship;Shop;Yard Work;Volunteer    Stability/Clinical Decision Making Stable/Uncomplicated    Rehab Potential Good    PT Duration 8 weeks    PT Treatment/Interventions ADLs/Self Care Home Management;Cryotherapy;Electrical Stimulation;Iontophoresis 4mg /ml Dexamethasone;Moist Heat;Ultrasound;DME Instruction;Gait training;Stair training;Functional mobility training;Therapeutic activities;Therapeutic exercise;Balance training;Neuromuscular re-education;Cognitive remediation;Patient/family education;Manual techniques;Energy conservation    PT Next Visit Plan still needs HEP; focus on strength and balance, continue gait training with LRAD             Patient will benefit from skilled therapeutic intervention in order to improve the following deficits and impairments:  Abnormal gait, Difficulty walking, Decreased endurance, Decreased safety awareness, Decreased activity tolerance, Decreased balance, Decreased knowledge of use of DME, Impaired flexibility, Decreased mobility, Decreased strength, Postural dysfunction  Visit Diagnosis: Unsteadiness on feet  Muscle weakness (generalized)  Repeated  falls  Difficulty in walking, not elsewhere classified     Problem List Patient Active Problem List   Diagnosis Date Noted   Memory change 04/24/2020   Right inguinal hernia  09/05/2017   Urinary frequency 09/05/2017   Coronary artery disease involving native coronary artery 11/05/2013   Imbalance 11/05/2013   Routine health maintenance 08/22/2010   Hearing loss 08/22/2009   ATRIAL FIBRILLATION 06/27/2008   Hyperlipidemia 12/09/2006   Essential hypertension 12/09/2006   MYOCARDIAL INFARCTION, HX OF 12/09/2006   CAD (coronary artery disease) 12/09/2006    Scot Jun, PTA 02/18/2021, 9:32 AM  [place]. [place], [place], [address] Phone: [phone]   Fax:  [phone]  Name: Larry Lopez MRN: [id number] Date of Birth: [birth year]

## 2021-02-20 ENCOUNTER — Encounter: Payer: Self-pay | Admitting: Physical Therapy

## 2021-02-20 ENCOUNTER — Ambulatory Visit: Payer: Medicare Other | Admitting: Physical Therapy

## 2021-02-20 ENCOUNTER — Other Ambulatory Visit: Payer: Self-pay

## 2021-02-20 DIAGNOSIS — R262 Difficulty in walking, not elsewhere classified: Secondary | ICD-10-CM | POA: Diagnosis not present

## 2021-02-20 DIAGNOSIS — R296 Repeated falls: Secondary | ICD-10-CM

## 2021-02-20 DIAGNOSIS — M6281 Muscle weakness (generalized): Secondary | ICD-10-CM

## 2021-02-20 DIAGNOSIS — R2681 Unsteadiness on feet: Secondary | ICD-10-CM | POA: Diagnosis not present

## 2021-02-20 NOTE — Therapy (Signed)
[place]. [place], [place], [address] Phone: [phone]   Fax:  [phone]  Physical Therapy Treatment  Patient Details  Name: Larry Lopez MRN: [id number] Date of Birth: 1926/03/08 Referring Provider (PT): Pricilla Holm   Encounter Date: 02/20/2021   PT End of Session - 02/20/21 1012     Visit Number 3    Date for PT Re-Evaluation 04/09/21    Authorization Type MCR and BCBS    Authorization Time Period 02/12/21 to 04/09/21    PT Start Time 0930    PT Stop Time 1013    PT Time Calculation (min) 43 min    Activity Tolerance Patient tolerated treatment well    Behavior During Therapy [facility] for tasks assessed/performed             Past Medical History:  Diagnosis Date   Atrial fibrillation ([place])    Bradycardia    CAD (coronary artery disease)    Glaucoma    HLD (hyperlipidemia)    HTN (hypertension)    Tachy-brady syndrome ([place])     Past Surgical History:  Procedure Laterality Date   CARDIAC SURGERY     CARDIOVERSION N/A 02/06/2013   Procedure: CARDIOVERSION;  Surgeon: Jolaine Artist, MD;  Location: [facility] ENDOSCOPY;  Service: Cardiovascular;  Laterality: N/A;   CORONARY ARTERY BYPASS GRAFT     TONSILLECTOMY AND ADENOIDECTOMY      There were no vitals filed for this visit.   Subjective Assessment - 02/20/21 0933     Subjective Feeling good with no pain    Currently in Pain? No/denies                               [facility] Adult PT Treatment/Exercise - 02/20/21 0001       Ambulation/Gait   Ambulation/Gait Yes    Ambulation/Gait Assistance 5: Supervision    Ambulation Distance (Feet) 300 Feet    Assistive device Small based quad cane    Gait Pattern Step-through pattern;Decreased arm swing - right      High Level Balance   High Level Balance Comments Side step on airex      Neuro Re-ed    Neuro Re-ed Details  On airex ball toss      Lumbar Exercises:  Aerobic   Nustep L4 x 6 min      Lumbar Exercises: Machines for Strengthening   Cybex Knee Extension 5lb 2x10    Cybex Knee Flexion 25lb 2x10      Lumbar Exercises: Standing   Other Standing Lumbar Exercises Side steps on and off airex      Knee/Hip Exercises: Standing   Walking with Sports Cord 30lb 4 way x 3 each    Other Standing Knee Exercises Alt Box taps 6in 2x10      Knee/Hip Exercises: Seated   Sit to Sand 2 sets;15 reps;without UE support   on airex                      PT Short Term Goals - 02/20/21 1012       PT SHORT TERM GOAL #1   Title Will be compliant with appropriate HEP to be progressed PRN    Status Partially Met      PT SHORT TERM GOAL #2   Title Will be able to name 3 ways to reduce fall risk at home and in community  Status On-going               PT Long Term Goals - 02/12/21 0956       PT LONG TERM GOAL #1   Title MMT to improve by at least 1 grade in all weak groups    Time 8    Period Weeks    Status New    Target Date 04/09/21      PT LONG TERM GOAL #2   Title Berg balance score to improve to at least 45 to show reduced fall risk/improved functional balance skills    Time 8    Period Weeks    Status New      PT LONG TERM GOAL #3   Title Will be able to ascend and descend at least 4 steps with no UE support and step over step pattern with no more than min guard assist to improve community access    Time 8    Period Weeks    Status New      PT LONG TERM GOAL #4   Title Will be able to ambulate at least 532f on indoor and outdoor surfaces with no more than supervision assist and no significant LOB    Time 8    Period Weeks    Status New                   Plan - 02/20/21 1013     Clinical Impression Statement Pt did well with resisted gait and LE strengthening interventions. Cues were provided to control the eccentric phase of resisted gait. Pt did well with ball toss on airex but has a difficult time  with side step on airex with multiple bouts of LOB. Min to mod assist needed with regain balance with side steps. Pt did better today ambulating with SBQU but would still lose sequencing at times.    Personal Factors and Comorbidities Age;Fitness;Behavior Pattern;Social Background;Time since onset of injury/illness/exacerbation    Examination-Activity Limitations Locomotion Level;Reach Overhead;Bend;Squat;Stairs;Stand    Examination-Participation Restrictions Church;Community Activity;Interpersonal Relationship;Shop;Yard Work;Volunteer    Rehab Potential Good    PT Frequency 2x / week    PT Treatment/Interventions ADLs/Self Care Home Management;Cryotherapy;Electrical Stimulation;Iontophoresis 4106mml Dexamethasone;Moist Heat;Ultrasound;DME Instruction;Gait training;Stair training;Functional mobility training;Therapeutic activities;Therapeutic exercise;Balance training;Neuromuscular re-education;Cognitive remediation;Patient/family education;Manual techniques;Energy conservation    PT Next Visit Plan focus on strength and balance, continue gait training with LRAD             Patient will benefit from skilled therapeutic intervention in order to improve the following deficits and impairments:  Abnormal gait, Difficulty walking, Decreased endurance, Decreased safety awareness, Decreased activity tolerance, Decreased balance, Decreased knowledge of use of DME, Impaired flexibility, Decreased mobility, Decreased strength, Postural dysfunction  Visit Diagnosis: Unsteadiness on feet  Muscle weakness (generalized)  Repeated falls  Difficulty in walking, not elsewhere classified     Problem List Patient Active Problem List   Diagnosis Date Noted   Memory change 04/24/2020   Right inguinal hernia 09/05/2017   Urinary frequency 09/05/2017   Coronary artery disease involving native coronary artery 11/05/2013   Imbalance 11/05/2013   Routine health maintenance 08/22/2010   Hearing loss  08/22/2009   ATRIAL FIBRILLATION 06/27/2008   Hyperlipidemia 12/09/2006   Essential hypertension 12/09/2006   MYOCARDIAL INFARCTION, HX OF 12/09/2006   CAD (coronary artery disease) 12/09/2006    [place] JunPTA 02/20/2021, 10:16 AM  [id number]  Phone: [phone]   Fax:  [phone]  Name: Larry Lopez MRN: [id number] Date of Birth: 1926-04-22

## 2021-02-25 ENCOUNTER — Ambulatory Visit: Payer: Medicare Other | Admitting: Physical Therapy

## 2021-02-26 ENCOUNTER — Ambulatory Visit: Payer: Medicare Other | Admitting: Physical Therapy

## 2021-02-26 ENCOUNTER — Encounter: Payer: Self-pay | Admitting: Physical Therapy

## 2021-02-26 ENCOUNTER — Other Ambulatory Visit: Payer: Self-pay

## 2021-02-26 DIAGNOSIS — M6281 Muscle weakness (generalized): Secondary | ICD-10-CM | POA: Diagnosis not present

## 2021-02-26 DIAGNOSIS — R296 Repeated falls: Secondary | ICD-10-CM | POA: Diagnosis not present

## 2021-02-26 DIAGNOSIS — R2681 Unsteadiness on feet: Secondary | ICD-10-CM | POA: Diagnosis not present

## 2021-02-26 DIAGNOSIS — R262 Difficulty in walking, not elsewhere classified: Secondary | ICD-10-CM

## 2021-02-26 NOTE — Therapy (Signed)
[address] [place], [place], [address] Phone: [phone]   Fax:  [phone]  Physical Therapy Treatment  Patient Details  Name: Larry Lopez MRN: [id number] Date of Birth: 13-Sep-1926 Referring Provider (PT): Pricilla Holm   Encounter Date: 02/26/2021   PT End of Session - 02/26/21 0921     Visit Number 4    Authorization Type MCR and BCBS    Authorization Time Period 02/12/21 to 04/09/21    PT Start Time 0845    PT Stop Time 0930    PT Time Calculation (min) 45 min    Activity Tolerance Patient tolerated treatment well    Behavior During Therapy [facility] for tasks assessed/performed             Past Medical History:  Diagnosis Date   Atrial fibrillation ([place])    Bradycardia    CAD (coronary artery disease)    Glaucoma    HLD (hyperlipidemia)    HTN (hypertension)    Tachy-brady syndrome ([place])     Past Surgical History:  Procedure Laterality Date   CARDIAC SURGERY     CARDIOVERSION N/A 02/06/2013   Procedure: CARDIOVERSION;  Surgeon: Jolaine Artist, MD;  Location: [facility] ENDOSCOPY;  Service: Cardiovascular;  Laterality: N/A;   CORONARY ARTERY BYPASS GRAFT     TONSILLECTOMY AND ADENOIDECTOMY      There were no vitals filed for this visit.   Subjective Assessment - 02/26/21 0845     Subjective "Good, great" No falls or stumbles    Currently in Pain? No/denies                               [facility] Adult PT Treatment/Exercise - 02/26/21 0001       High Level Balance   High Level Balance Activities Backward walking    High Level Balance Comments side stpe over WaTE bar      Lumbar Exercises: Aerobic   Recumbent Bike L2 x 6 min      Lumbar Exercises: Machines for Strengthening   Cybex Knee Extension 5lb 2x10    Cybex Knee Flexion 25lb 2x10      Knee/Hip Exercises: Standing   Heel Raises Both;2 sets;10 reps;2 seconds    Forward Step Up Both;2 sets;5 sets;Hand  Hold: 0;Step Height: 4"    Walking with Sports Cord 30lb 4 way x 3 each    Other Standing Knee Exercises Alt Box taps 8in 2x10 with SBQC                       PT Short Term Goals - 02/20/21 1012       PT SHORT TERM GOAL #1   Title Will be compliant with appropriate HEP to be progressed PRN    Status Partially Met      PT SHORT TERM GOAL #2   Title Will be able to name 3 ways to reduce fall risk at home and in community    Status On-going               PT Long Term Goals - 02/26/21 1194       PT LONG TERM GOAL #1   Title MMT to improve by at least 1 grade in all weak groups    Status On-going      PT LONG TERM GOAL #2   Title Berg balance score to improve to  at least 45 to show reduced fall risk/improved functional balance skills    Status On-going      PT LONG TERM GOAL #3   Title Will be able to ascend and descend at least 4 steps with no UE support and step over step pattern with no more than min guard assist to improve community access    Status On-going      PT LONG TERM GOAL #4   Title Will be able to ambulate at least 528f on indoor and outdoor surfaces with no more than supervision assist and no significant LOB    Status On-going                   Plan - 02/26/21 08177    Clinical Impression Statement Pt continues to do well in therapy. Encouragement needed with interventions he finds more difficult. Some instability present with 4 inch step ups, side step over WaTE bar, and with alt box taps. Pt tends to get confused with some interventions requiring multiple demonstrations of the same activity to complete them correctly.    Personal Factors and Comorbidities Age;Fitness;Behavior Pattern;Social Background;Time since onset of injury/illness/exacerbation    Examination-Activity Limitations Locomotion Level;Reach Overhead;Bend;Squat;Stairs;Stand    Examination-Participation Restrictions Church;Community Activity;Interpersonal  Relationship;Shop;Yard Work;Volunteer    Stability/Clinical Decision Making Stable/Uncomplicated    Rehab Potential Good    PT Frequency 2x / week    PT Duration 8 weeks    PT Treatment/Interventions ADLs/Self Care Home Management;Cryotherapy;Electrical Stimulation;Iontophoresis 432mml Dexamethasone;Moist Heat;Ultrasound;DME Instruction;Gait training;Stair training;Functional mobility training;Therapeutic activities;Therapeutic exercise;Balance training;Neuromuscular re-education;Cognitive remediation;Patient/family education;Manual techniques;Energy conservation    PT Next Visit Plan focus on strength and balance, continue gait training with LRAD             Patient will benefit from skilled therapeutic intervention in order to improve the following deficits and impairments:  Abnormal gait, Difficulty walking, Decreased endurance, Decreased safety awareness, Decreased activity tolerance, Decreased balance, Decreased knowledge of use of DME, Impaired flexibility, Decreased mobility, Decreased strength, Postural dysfunction  Visit Diagnosis: Unsteadiness on feet  Repeated falls  Difficulty in walking, not elsewhere classified  Muscle weakness (generalized)     Problem List Patient Active Problem List   Diagnosis Date Noted   Memory change 04/24/2020   Right inguinal hernia 09/05/2017   Urinary frequency 09/05/2017   Coronary artery disease involving native coronary artery 11/05/2013   Imbalance 11/05/2013   Routine health maintenance 08/22/2010   Hearing loss 08/22/2009   ATRIAL FIBRILLATION 06/27/2008   Hyperlipidemia 12/09/2006   Essential hypertension 12/09/2006   MYOCARDIAL INFARCTION, HX OF 12/09/2006   CAD (coronary artery disease) 12/09/2006    RoScot JunPTA 02/26/2021, 9:25 AM  [id number]: [phone] Fax:  [phone]: Larry HANRAHANRN:  [id number] of Birth: 7/Nov 16, 1926

## 2021-03-02 ENCOUNTER — Other Ambulatory Visit: Payer: Self-pay

## 2021-03-02 ENCOUNTER — Encounter: Payer: Self-pay | Admitting: Physical Therapy

## 2021-03-02 ENCOUNTER — Ambulatory Visit: Payer: Medicare Other | Admitting: Physical Therapy

## 2021-03-02 DIAGNOSIS — M6281 Muscle weakness (generalized): Secondary | ICD-10-CM

## 2021-03-02 DIAGNOSIS — R296 Repeated falls: Secondary | ICD-10-CM | POA: Diagnosis not present

## 2021-03-02 DIAGNOSIS — R262 Difficulty in walking, not elsewhere classified: Secondary | ICD-10-CM

## 2021-03-02 DIAGNOSIS — R2681 Unsteadiness on feet: Secondary | ICD-10-CM | POA: Diagnosis not present

## 2021-03-02 NOTE — Therapy (Signed)
[address] [place], [place], [address] Phone: [phone]   Fax:  [phone]  Physical Therapy Treatment  Patient Details  Name: Larry Lopez MRN: [id number] Date of Birth: August 17, 1926 Referring Provider (PT): Pricilla Holm   Encounter Date: 03/02/2021   PT End of Session - 03/02/21 0925     Visit Number 5    Date for PT Re-Evaluation 04/09/21    Authorization Type MCR and BCBS    PT Start Time 0845    PT Stop Time 0928    PT Time Calculation (min) 43 min    Activity Tolerance Patient tolerated treatment well    Behavior During Therapy [facility] for tasks assessed/performed             Past Medical History:  Diagnosis Date   Atrial fibrillation ([place])    Bradycardia    CAD (coronary artery disease)    Glaucoma    HLD (hyperlipidemia)    HTN (hypertension)    Tachy-brady syndrome ([place])     Past Surgical History:  Procedure Laterality Date   CARDIAC SURGERY     CARDIOVERSION N/A 02/06/2013   Procedure: CARDIOVERSION;  Surgeon: Jolaine Artist, MD;  Location: [facility] ENDOSCOPY;  Service: Cardiovascular;  Laterality: N/A;   CORONARY ARTERY BYPASS GRAFT     TONSILLECTOMY AND ADENOIDECTOMY      There were no vitals filed for this visit.   Subjective Assessment - 03/02/21 0849     Subjective "Feeling fine, good"    Currently in Pain? No/denies                               [facility] Adult PT Treatment/Exercise - 03/02/21 0001       Ambulation/Gait   Stairs Yes    Stairs Assistance 4: Min guard    Stair Management Technique No rails;One rail Right;One rail Left;Alternating pattern    Number of Stairs 15    Height of Stairs 4   & 6     High Level Balance   High Level Balance Activities Side stepping;Figure 8 turns   All on black pad   High Level Balance Comments Ball toss on black mat      Therapeutic Activites    Therapeutic Activities Other Therapeutic Activities     Other Therapeutic Activities gettin up from floor with use of mat      Lumbar Exercises: Standing   Other Standing Lumbar Exercises Standing marches on black mat    Other Standing Lumbar Exercises Alt 4 & 6 inch box taps x10 each                       PT Short Term Goals - 02/20/21 1012       PT SHORT TERM GOAL #1   Title Will be compliant with appropriate HEP to be progressed PRN    Status Partially Met      PT SHORT TERM GOAL #2   Title Will be able to name 3 ways to reduce fall risk at home and in community    Status On-going               PT Long Term Goals - 02/26/21 1638       PT LONG TERM GOAL #1   Title MMT to improve by at least 1 grade in all weak groups    Status On-going  PT LONG TERM GOAL #2   Title Berg balance score to improve to at least 45 to show reduced fall risk/improved functional balance skills    Status On-going      PT LONG TERM GOAL #3   Title Will be able to ascend and descend at least 4 steps with no UE support and step over step pattern with no more than min guard assist to improve community access    Status On-going      PT LONG TERM GOAL #4   Title Will be able to ambulate at least 585f on indoor and outdoor surfaces with no more than supervision assist and no significant LOB    Status On-going                   Plan - 03/02/21 0926     Clinical Impression Statement A lot of work spent with non compliant surfaces. Posterior LOB at times despite having some righting reactions requiring assist st times to correct. Pt tends to crab walk with figure eights despite cues. Went over all HEP interventions, pt verbalize understanding and returned demonstrations.    Examination-Activity Limitations Locomotion Level;Reach Overhead;Bend;Squat;Stairs;Stand    Examination-Participation Restrictions Church;Community Activity;Interpersonal Relationship;Shop;Yard Work;Volunteer    Stability/Clinical Decision Making  Stable/Uncomplicated    PT Frequency 2x / week    PT Duration 8 weeks    PT Treatment/Interventions ADLs/Self Care Home Management;Cryotherapy;Electrical Stimulation;Iontophoresis 433mml Dexamethasone;Moist Heat;Ultrasound;DME Instruction;Gait training;Stair training;Functional mobility training;Therapeutic activities;Therapeutic exercise;Balance training;Neuromuscular re-education;Cognitive remediation;Patient/family education;Manual techniques;Energy conservation    PT Next Visit Plan focus on strength and balance, continue gait training with LRAD    PT Home Exercise Plan Access Code: [email or web address]: [email or web address]  Date: 03/02/2021  Prepared by: RoCheri Fowler  Exercises  Standing Tandem Balance with Counter Support - 1 x daily - 7 x weekly - 3 sets - 10 reps  Standing Single Leg Stance with Counter Support - 1 x daily - 7 x weekly - 3 sets - 10 reps  Sit to Stand - 1 x daily - 7 x weekly - 3 sets - 10 reps             Patient will benefit from skilled therapeutic intervention in order to improve the following deficits and impairments:     Visit Diagnosis: Unsteadiness on feet  Repeated falls  Muscle weakness (generalized)  Difficulty in walking, not elsewhere classified     Problem List Patient Active Problem List   Diagnosis Date Noted   Memory change 04/24/2020   Right inguinal hernia 09/05/2017   Urinary frequency 09/05/2017   Coronary artery disease involving native coronary artery 11/05/2013   Imbalance 11/05/2013   Routine health maintenance 08/22/2010   Hearing loss 08/22/2009   ATRIAL FIBRILLATION 06/27/2008   Hyperlipidemia 12/09/2006   Essential hypertension 12/09/2006   MYOCARDIAL INFARCTION, HX OF 12/09/2006   CAD (coronary artery disease) 12/09/2006    RoScot JunPTA 03/02/2021, 9:30 AM  [id number]: [phone] Fax:   [phone]: Larry WIGFALLRN: [id number] of Birth: 07/1926-11-10

## 2021-03-04 ENCOUNTER — Encounter: Payer: Self-pay | Admitting: Physical Therapy

## 2021-03-04 ENCOUNTER — Ambulatory Visit: Payer: Medicare Other | Admitting: Physical Therapy

## 2021-03-04 ENCOUNTER — Other Ambulatory Visit: Payer: Self-pay

## 2021-03-04 DIAGNOSIS — R262 Difficulty in walking, not elsewhere classified: Secondary | ICD-10-CM

## 2021-03-04 DIAGNOSIS — M6281 Muscle weakness (generalized): Secondary | ICD-10-CM | POA: Diagnosis not present

## 2021-03-04 DIAGNOSIS — R2681 Unsteadiness on feet: Secondary | ICD-10-CM | POA: Diagnosis not present

## 2021-03-04 DIAGNOSIS — R296 Repeated falls: Secondary | ICD-10-CM

## 2021-03-04 NOTE — Therapy (Signed)
[address] [place], [place], [address] Phone: [phone]   Fax:  [phone]  Physical Therapy Treatment  Patient Details  Name: Larry Lopez MRN: [id number] Date of Birth: 21-Mar-1926 Referring Provider (PT): Pricilla Holm   Encounter Date: 03/04/2021   PT End of Session - 03/04/21 0929     Visit Number 6    Date for PT Re-Evaluation 04/09/21    Authorization Type MCR and BCBS    Authorization Time Period 02/12/21 to 04/09/21    PT Start Time 0845    PT Stop Time 0929    PT Time Calculation (min) 44 min    Activity Tolerance Patient tolerated treatment well    Behavior During Therapy [facility] for tasks assessed/performed             Past Medical History:  Diagnosis Date   Atrial fibrillation ([place])    Bradycardia    CAD (coronary artery disease)    Glaucoma    HLD (hyperlipidemia)    HTN (hypertension)    Tachy-brady syndrome ([place])     Past Surgical History:  Procedure Laterality Date   CARDIAC SURGERY     CARDIOVERSION N/A 02/06/2013   Procedure: CARDIOVERSION;  Surgeon: Jolaine Artist, MD;  Location: [facility] ENDOSCOPY;  Service: Cardiovascular;  Laterality: N/A;   CORONARY ARTERY BYPASS GRAFT     TONSILLECTOMY AND ADENOIDECTOMY      There were no vitals filed for this visit.   Subjective Assessment - 03/04/21 0849     Subjective "Good"    Currently in Pain? No/denies                               [facility] Adult PT Treatment/Exercise - 03/04/21 0001       Ambulation/Gait   Gait Comments Gait to the back building 1 flight of stairs then back down, up slope. Up and down multiple curbs      Lumbar Exercises: Aerobic   Nustep L4 x 6 min      Lumbar Exercises: Machines for Strengthening   Cybex Knee Extension 10lb 2x12    Cybex Knee Flexion 25lb 2x12      Lumbar Exercises: Standing   Shoulder Extension Power Tower;Both;5 reps;10 reps    Shoulder Extension  Limitations 5 then 10lb    Other Standing Lumbar Exercises Alt 6 inch box taps 2x10 each      Knee/Hip Exercises: Seated   Sit to Sand 2 sets;5 reps;without UE support   LE on airex, differnt mat heights                      PT Short Term Goals - 02/20/21 1012       PT SHORT TERM GOAL #1   Title Will be compliant with appropriate HEP to be progressed PRN    Status Partially Met      PT SHORT TERM GOAL #2   Title Will be able to name 3 ways to reduce fall risk at home and in community    Status On-going               PT Long Term Goals - 02/26/21 4128       PT LONG TERM GOAL #1   Title MMT to improve by at least 1 grade in all weak groups    Status On-going      PT LONG TERM  GOAL #2   Title Berg balance score to improve to at least 45 to show reduced fall risk/improved functional balance skills    Status On-going      PT LONG TERM GOAL #3   Title Will be able to ascend and descend at least 4 steps with no UE support and step over step pattern with no more than min guard assist to improve community access    Status On-going      PT LONG TERM GOAL #4   Title Will be able to ambulate at least 572f on indoor and outdoor surfaces with no more than supervision assist and no significant LOB    Status On-going                   Plan - 03/04/21 0929     Clinical Impression Statement Time step with outdoor ambulation and stair negotiation. Cue needed to look up when ambulation, cues provided with little compliance. When negotiating curbs pt tends to step early over extending himself at times. Cue not to allow UE support to drag behind him on rail when descending stairs. Pt reports some burning with uphill ambulation in anterior R shaft. increase fatigue also noted with uphill ambulation. Some instability with ALT box taps requiring CGA at times.    Personal Factors and Comorbidities Age;Fitness;Behavior Pattern;Social Background;Time since onset of  injury/illness/exacerbation    Examination-Activity Limitations Locomotion Level;Reach Overhead;Bend;Squat;Stairs;Stand    Examination-Participation Restrictions Church;Community Activity;Interpersonal Relationship;Shop;Yard Work;Volunteer    Rehab Potential Good    PT Frequency 2x / week    PT Duration 8 weeks    PT Treatment/Interventions ADLs/Self Care Home Management;Cryotherapy;Electrical Stimulation;Iontophoresis 47mml Dexamethasone;Moist Heat;Ultrasound;DME Instruction;Gait training;Stair training;Functional mobility training;Therapeutic activities;Therapeutic exercise;Balance training;Neuromuscular re-education;Cognitive remediation;Patient/family education;Manual techniques;Energy conservation    PT Next Visit Plan focus on strength and balance, continue gait training with LRAD             Patient will benefit from skilled therapeutic intervention in order to improve the following deficits and impairments:  Abnormal gait, Difficulty walking, Decreased endurance, Decreased safety awareness, Decreased activity tolerance, Decreased balance, Decreased knowledge of use of DME, Impaired flexibility, Decreased mobility, Decreased strength, Postural dysfunction  Visit Diagnosis: Unsteadiness on feet  Muscle weakness (generalized)  Difficulty in walking, not elsewhere classified  Repeated falls     Problem List Patient Active Problem List   Diagnosis Date Noted   Memory change 04/24/2020   Right inguinal hernia 09/05/2017   Urinary frequency 09/05/2017   Coronary artery disease involving native coronary artery 11/05/2013   Imbalance 11/05/2013   Routine health maintenance 08/22/2010   Hearing loss 08/22/2009   ATRIAL FIBRILLATION 06/27/2008   Hyperlipidemia 12/09/2006   Essential hypertension 12/09/2006   MYOCARDIAL INFARCTION, HX OF 12/09/2006   CAD (coronary artery disease) 12/09/2006    RoScot JunPTA 03/04/2021, 9:32 AM  [id number]: [phone] Fax:  [phone]: RaJOHANAN SKORUPSKIRN: [id number] of Birth: [birth year]

## 2021-03-10 ENCOUNTER — Ambulatory Visit: Payer: Medicare Other | Admitting: Physical Therapy

## 2021-03-10 ENCOUNTER — Encounter: Payer: Self-pay | Admitting: Physical Therapy

## 2021-03-10 ENCOUNTER — Other Ambulatory Visit: Payer: Self-pay

## 2021-03-10 DIAGNOSIS — R296 Repeated falls: Secondary | ICD-10-CM | POA: Diagnosis not present

## 2021-03-10 DIAGNOSIS — R262 Difficulty in walking, not elsewhere classified: Secondary | ICD-10-CM | POA: Diagnosis not present

## 2021-03-10 DIAGNOSIS — R2681 Unsteadiness on feet: Secondary | ICD-10-CM | POA: Diagnosis not present

## 2021-03-10 DIAGNOSIS — M6281 Muscle weakness (generalized): Secondary | ICD-10-CM

## 2021-03-10 NOTE — Therapy (Signed)
[place]. [place], [place], [address] Phone: [phone]   Fax:  [phone]  Physical Therapy Treatment  Patient Details  Name: Larry Lopez MRN: [id number] Date of Birth: 12/19/1926 Referring Provider (PT): Pricilla Holm   Encounter Date: 03/10/2021   PT End of Session - 03/10/21 0923     Visit Number 7    Date for PT Re-Evaluation 04/09/21    Authorization Type MCR and BCBS    Authorization Time Period 02/12/21 to 04/09/21    PT Start Time 0845    PT Stop Time 0929    PT Time Calculation (min) 44 min    Activity Tolerance Patient tolerated treatment well    Behavior During Therapy [facility] for tasks assessed/performed             Past Medical History:  Diagnosis Date   Atrial fibrillation ([place])    Bradycardia    CAD (coronary artery disease)    Glaucoma    HLD (hyperlipidemia)    HTN (hypertension)    Tachy-brady syndrome ([place])     Past Surgical History:  Procedure Laterality Date   CARDIAC SURGERY     CARDIOVERSION N/A 02/06/2013   Procedure: CARDIOVERSION;  Surgeon: Jolaine Artist, MD;  Location: [facility] ENDOSCOPY;  Service: Cardiovascular;  Laterality: N/A;   CORONARY ARTERY BYPASS GRAFT     TONSILLECTOMY AND ADENOIDECTOMY      There were no vitals filed for this visit.   Subjective Assessment - 03/10/21 0843     Subjective Feeling good, ding fine. Pt enters without SBQC    Currently in Pain? No/denies                [facility] PT Assessment - 03/10/21 0001       Strength   Right Hip Flexion 4/5    Right Hip ABduction 4+/5    Left Hip Flexion 4/5    Left Hip ABduction 4+/5    Right Knee Extension 4+/5    Left Knee Extension 4+/5    Left Ankle Dorsiflexion 5/5      Berg Balance Test   Sit to Stand Able to stand without using hands and stabilize independently    Standing Unsupported Able to stand safely 2 minutes    Sitting with Back Unsupported but Feet Supported on  Floor or Stool Able to sit safely and securely 2 minutes    Stand to Sit Sits safely with minimal use of hands    Transfers Able to transfer safely, minor use of hands    Standing Unsupported with Eyes Closed Able to stand 10 seconds safely    Standing Unsupported with Feet Together Able to place feet together independently and stand 1 minute safely    From Standing, Reach Forward with Outstretched Arm Can reach forward >12 cm safely (5")    From Standing Position, Pick up Object from Floor Able to pick up shoe safely and easily    From Standing Position, Turn to Look Behind Over each Shoulder Turn sideways only but maintains balance    Turn 360 Degrees Able to turn 360 degrees safely but slowly    Standing Unsupported, Alternately Place Feet on Step/Stool Able to stand independently and safely and complete 8 steps in 20 seconds    Standing Unsupported, One Foot in Front Able to take small step independently and hold 30 seconds    Standing on One Leg Able to lift leg independently and hold 5-10 seconds  Total Score 48                           [facility] Adult PT Treatment/Exercise - 03/10/21 0001       Ambulation/Gait   Stairs Yes    Stairs Assistance 4: Min guard    Stair Management Technique No rails;One rail Right;One rail Left;Alternating pattern    Number of Stairs 15    Height of Stairs '4   7 6     ' Lumbar Exercises: Aerobic   Nustep L5 x 6 min   Reports "Awareness in chest" 100% O2, BP 123/97     Lumbar Exercises: Machines for Strengthening   Cybex Knee Extension 15lb 2x10    Cybex Knee Flexion 35lb 2x10      Lumbar Exercises: Standing   Shoulder Extension Power Tower;Both;20 reps;Strengthening    Shoulder Extension Limitations 10      Lumbar Exercises: Seated   Sit to Stand 10 reps   OHP yellow ball,                      PT Short Term Goals - 02/20/21 1012       PT SHORT TERM GOAL #1   Title Will be compliant with appropriate HEP to be  progressed PRN    Status Partially Met      PT SHORT TERM GOAL #2   Title Will be able to name 3 ways to reduce fall risk at home and in community    Status On-going               PT Long Term Goals - 03/10/21 0910       PT LONG TERM GOAL #1   Title MMT to improve by at least 1 grade in all weak groups    Status Achieved      PT LONG TERM GOAL #2   Title Berg balance score to improve to at least 45 to show reduced fall risk/improved functional balance skills    Status Achieved      PT LONG TERM GOAL #3   Title Will be able to ascend and descend at least 4 steps with no UE support and step over step pattern with no more than min guard assist to improve community access    Status Partially Met      PT LONG TERM GOAL #4   Title Will be able to ambulate at least 561f on indoor and outdoor surfaces with no more than supervision assist and no significant LOB    Status On-going                   Plan - 03/10/21 05670    Clinical Impression Statement Pt has progressed increasing his BERG balance score and well as his LE strength. Some LE fatigue noted today's with sit to stands. Tactile cue to prevent forward trunk flexion with shoulder extensions. Increase resistance tolerated with leg curls and extensions.    Personal Factors and Comorbidities Age;Fitness;Behavior Pattern;Social Background;Time since onset of injury/illness/exacerbation    Examination-Activity Limitations Locomotion Level;Reach Overhead;Bend;Squat;Stairs;Stand    Examination-Participation Restrictions Church;Community Activity;Interpersonal Relationship;Shop;Yard Work;Volunteer    Stability/Clinical Decision Making Stable/Uncomplicated    Rehab Potential Good    PT Frequency 2x / week    PT Duration 8 weeks    PT Treatment/Interventions ADLs/Self Care Home Management;Cryotherapy;Electrical Stimulation;Iontophoresis 447mml Dexamethasone;Moist Heat;Ultrasound;DME Instruction;Gait training;Stair  training;Functional mobility training;Therapeutic activities;Therapeutic exercise;Balance training;Neuromuscular re-education;Cognitive remediation;Patient/family  education;Manual techniques;Energy conservation    PT Next Visit Plan focus on strength and balance, continue gait training with LRAD             Patient will benefit from skilled therapeutic intervention in order to improve the following deficits and impairments:  Abnormal gait, Difficulty walking, Decreased endurance, Decreased safety awareness, Decreased activity tolerance, Decreased balance, Decreased knowledge of use of DME, Impaired flexibility, Decreased mobility, Decreased strength, Postural dysfunction  Visit Diagnosis: Unsteadiness on feet  Difficulty in walking, not elsewhere classified  Repeated falls  Muscle weakness (generalized)     Problem List Patient Active Problem List   Diagnosis Date Noted   Memory change 04/24/2020   Right inguinal hernia 09/05/2017   Urinary frequency 09/05/2017   Coronary artery disease involving native coronary artery 11/05/2013   Imbalance 11/05/2013   Routine health maintenance 08/22/2010   Hearing loss 08/22/2009   ATRIAL FIBRILLATION 06/27/2008   Hyperlipidemia 12/09/2006   Essential hypertension 12/09/2006   MYOCARDIAL INFARCTION, HX OF 12/09/2006   CAD (coronary artery disease) 12/09/2006    Scot Jun, PTA 03/10/2021, 9:28 AM  [place]. [place], [place], [address] Phone: [phone]   Fax:  [phone]  Name: Larry Lopez MRN: [id number] Date of Birth: [birth year]

## 2021-03-12 ENCOUNTER — Encounter: Payer: Self-pay | Admitting: Physical Therapy

## 2021-03-12 ENCOUNTER — Ambulatory Visit: Payer: Medicare Other | Attending: Internal Medicine | Admitting: Physical Therapy

## 2021-03-12 ENCOUNTER — Other Ambulatory Visit: Payer: Self-pay

## 2021-03-12 DIAGNOSIS — R2681 Unsteadiness on feet: Secondary | ICD-10-CM | POA: Diagnosis not present

## 2021-03-12 DIAGNOSIS — R262 Difficulty in walking, not elsewhere classified: Secondary | ICD-10-CM | POA: Diagnosis not present

## 2021-03-12 DIAGNOSIS — M6281 Muscle weakness (generalized): Secondary | ICD-10-CM | POA: Diagnosis not present

## 2021-03-12 DIAGNOSIS — R296 Repeated falls: Secondary | ICD-10-CM

## 2021-03-12 NOTE — Therapy (Signed)
[facility] ?[place] ?[address] ?[place], [place], [address] ?Phone: [phone]   Fax:  [phone] ? ?Physical Therapy Treatment ? ?Patient Details  ?Name: Larry Lopez ?MRN: [id number] ?Date of Birth: 12/08/1926 ?Referring Provider (PT): Pricilla Holm ? ? ?Encounter Date: 03/12/2021 ? ? PT End of Session - 03/12/21 0920   ? ? Visit Number 8   ? Date for PT Re-Evaluation 04/09/21   ? Authorization Type MCR and BCBS   ? Authorization Time Period 02/12/21 to 04/09/21   ? PT Start Time 0845   ? PT Stop Time 0930   ? PT Time Calculation (min) 45 min   ? Activity Tolerance Patient tolerated treatment well   ? Behavior During Therapy [facility] for tasks assessed/performed   ? ?  ?  ? ?  ? ? ?Past Medical History:  ?Diagnosis Date  ? Atrial fibrillation ([place])   ? Bradycardia   ? CAD (coronary artery disease)   ? Glaucoma   ? HLD (hyperlipidemia)   ? HTN (hypertension)   ? Tachy-brady syndrome ([place])   ? ? ?Past Surgical History:  ?Procedure Laterality Date  ? CARDIAC SURGERY    ? CARDIOVERSION N/A 02/06/2013  ? Procedure: CARDIOVERSION;  Surgeon: Jolaine Artist, MD;  [facility] END[facility];  Service: Cardiovascular;  Laterality: N/A;  ? CORONARY ARTERY BYPASS GRAFT    ? TONSILLECTOMY AND ADENOIDECTOMY    ? ? ?There were no vitals filed for this visit. ? ? Subjective Assessment - 03/12/21 0849   ? ? Subjective Doing ok, feeling fine   ? Currently in Pain? No/denies   ? ?  ?  ? ?  ? ? ? ? ? ? ? ? ? ? ? ? ? ? ? ? ? ? ? ? [facility] Adult PT Treatment/Exercise - 03/12/21 0001   ? ?  ? High Level Balance  ? High Level Balance Activities Side stepping;Backward walking;Marching forwards;Marching backwards   standing marches  ? High Level Balance Comments all on black mat   ?  ? Therapeutic Activites   ? Therapeutic Activities Other Therapeutic Activities   ? Other Therapeutic Activities gettin up from floor with use of mat, and without mat   ?  ? Lumbar Exercises: Aerobic  ? Nustep L5 x 6  min   ?  ? Lumbar Exercises: Machines for Strengthening  ? Cybex Knee Flexion 35lb 2x10   ?  ? Lumbar Exercises: Seated  ? Sit to Stand 10 reps   with yellow ball in hand LE on mat  ? ?  ?  ? ?  ? ? ? ? ? ? ? ? ? ? ? ? PT Short Term Goals - 02/20/21 1012   ? ?  ? PT SHORT TERM GOAL #1  ? Title Will be compliant with appropriate HEP to be progressed PRN   ? Status Partially Met   ?  ? PT SHORT TERM GOAL #2  ? Title Will be able to name 3 ways to reduce fall risk at home and in community   ? Status On-going   ? ?  ?  ? ?  ? ? ? ? PT Long Term Goals - 03/10/21 0910   ? ?  ? PT LONG TERM GOAL #1  ? Title MMT to improve by at least 1 grade in all weak groups   ? Status Achieved   ?  ? PT LONG TERM GOAL #2  ? Title Berg balance score to  improve to at least 45 to show reduced fall risk/improved functional balance skills   ? Status Achieved   ?  ? PT LONG TERM GOAL #3  ? Title Will be able to ascend and descend at least 4 steps with no UE support and step over step pattern with no more than min guard assist to improve community access   ? Status Partially Met   ?  ? PT LONG TERM GOAL #4  ? Title Will be able to ambulate at least 542f on indoor and outdoor surfaces with no more than supervision assist and no significant LOB   ? Status On-going   ? ?  ?  ? ?  ? ? ? ? ? ? ? ? Plan - 03/12/21 0921   ? ? Clinical Impression Statement Pt expressed that's he did not know how to get up is he were to fall, despite going over it before. Pt's with stated that's pt has some memory decline. Pt able to transfer form supine to stand with and without use of mat table. heavy focus spent on higher level balance interventions with black mat. Some low of balance but with good righting reaction. Pt did require assist once losing his balance with side steps .   ? Personal Factors and Comorbidities Age;Fitness;Behavior Pattern;Social Background;Time since onset of injury/illness/exacerbation   ? Examination-Activity Limitations Locomotion  Level;Reach Overhead;Bend;Squat;Stairs;Stand   ? Examination-Participation Restrictions Church;Community Activity;Interpersonal Relationship;Shop;Yard Work;Volunteer   ? Stability/Clinical Decision Making Stable/Uncomplicated   ? Rehab Potential Good   ? PT Frequency 2x / week   ? PT Duration 8 weeks   ? PT Treatment/Interventions ADLs/Self Care Home Management;Cryotherapy;Electrical Stimulation;Iontophoresis 465mml Dexamethasone;Moist Heat;Ultrasound;DME Instruction;Gait training;Stair training;Functional mobility training;Therapeutic activities;Therapeutic exercise;Balance training;Neuromuscular re-education;Cognitive remediation;Patient/family education;Manual techniques;Energy conservation   ? PT Next Visit Plan focus on strength and balance, continue gait training with LRAD   ? ?  ?  ? ?  ? ? ?Patient will benefit from skilled therapeutic intervention in order to improve the following deficits and impairments:  Abnormal gait, Difficulty walking, Decreased endurance, Decreased safety awareness, Decreased activity tolerance, Decreased balance, Decreased knowledge of use of DME, Impaired flexibility, Decreased mobility, Decreased strength, Postural dysfunction ? ?Visit Diagnosis: ?Difficulty in walking, not elsewhere classified ? ?Repeated falls ? ?Muscle weakness (generalized) ? ?Unsteadiness on feet ? ? ? ? ?Problem List ?Patient Active Problem List  ? Diagnosis Date Noted  ? Memory change 04/24/2020  ? Right inguinal hernia 09/05/2017  ? Urinary frequency 09/05/2017  ? Coronary artery disease involving native coronary artery 11/05/2013  ? Imbalance 11/05/2013  ? Routine health maintenance 08/22/2010  ? Hearing loss 08/22/2009  ? ATRIAL FIBRILLATION 06/27/2008  ? Hyperlipidemia 12/09/2006  ? Essential hypertension 12/09/2006  ? MYOCARDIAL INFARCTION, HX OF 12/09/2006  ? CAD (coronary artery disease) 12/09/2006  ? ? ?RoScot JunPTA ?03/12/2021, 9:25 AM ? ?[facility] ?[address]Phone: [phone] Fax:  [phone] ?Name: Larry Lopez ?MRN: [id number] of Birth: 7/March 08, 1926 ? ? ?

## 2021-03-17 ENCOUNTER — Other Ambulatory Visit: Payer: Self-pay

## 2021-03-17 ENCOUNTER — Ambulatory Visit: Payer: Medicare Other | Admitting: Physical Therapy

## 2021-03-17 ENCOUNTER — Encounter: Payer: Self-pay | Admitting: Physical Therapy

## 2021-03-17 DIAGNOSIS — R296 Repeated falls: Secondary | ICD-10-CM

## 2021-03-17 DIAGNOSIS — R2681 Unsteadiness on feet: Secondary | ICD-10-CM | POA: Diagnosis not present

## 2021-03-17 DIAGNOSIS — R262 Difficulty in walking, not elsewhere classified: Secondary | ICD-10-CM

## 2021-03-17 DIAGNOSIS — M6281 Muscle weakness (generalized): Secondary | ICD-10-CM

## 2021-03-17 NOTE — Therapy (Signed)
[facility] ?[place] ?[address] ?[place], [place], [address] ?Phone: [phone]   Fax:  [phone] ? ?Physical Therapy Treatment ? ?Patient Details  ?Name: Larry Lopez ?MRN: [id number] ?Date of Birth: Aug 08, 1926 ?Referring Provider (PT): Pricilla Holm ? ? ?Encounter Date: 03/17/2021 ? ? PT End of Session - 03/17/21 6967   ? ? Visit Number 9   ? Date for PT Re-Evaluation 04/09/21   ? Authorization Type MCR and BCBS   ? Authorization Time Period 02/12/21 to 04/09/21   ? PT Start Time 0845   ? PT Stop Time 0930   ? PT Time Calculation (min) 45 min   ? Activity Tolerance Patient tolerated treatment well   ? Behavior During Therapy [facility] for tasks assessed/performed   ? ?  ?  ? ?  ? ? ?Past Medical History:  ?Diagnosis Date  ? Atrial fibrillation ([place])   ? Bradycardia   ? CAD (coronary artery disease)   ? Glaucoma   ? HLD (hyperlipidemia)   ? HTN (hypertension)   ? Tachy-brady syndrome ([place])   ? ? ?Past Surgical History:  ?Procedure Laterality Date  ? CARDIAC SURGERY    ? CARDIOVERSION N/A 02/06/2013  ? Procedure: CARDIOVERSION;  Surgeon: Jolaine Artist, MD;  Location: [facility] ENDOSCOPY;  Service: Cardiovascular;  Laterality: N/A;  ? CORONARY ARTERY BYPASS GRAFT    ? TONSILLECTOMY AND ADENOIDECTOMY    ? ? ?There were no vitals filed for this visit. ? ? Subjective Assessment - 03/17/21 0846   ? ? Subjective Doing good   ? Currently in Pain? No/denies   ? ?  ?  ? ?  ? ? ? ? ? ? ? ? ? ? ? ? ? ? ? ? ? ? ? ? [facility] Adult PT Treatment/Exercise - 03/17/21 0001   ? ?  ? Ambulation/Gait  ? Gait Comments Gait around back building up and down slope. some decrease in gait speed with up hill ambulation.   ?  ? Lumbar Exercises: Aerobic  ? Recumbent Bike L2.5 x 6 min   ?  ? Lumbar Exercises: Standing  ? Shoulder Extension Power Tower;Both;20 reps;Strengthening   ? Shoulder Extension Limitations 10   ?  ? Lumbar Exercises: Seated  ? Sit to Stand 10 reps   x2,  LE on airex  ?  ? Knee/Hip  Exercises: Standing  ? Walking with Sports Cord 30lb side stpes x5 each   ? ?  ?  ? ?  ? ? ? ? ? ? ? ? ? ? ? ? PT Short Term Goals - 02/20/21 1012   ? ?  ? PT SHORT TERM GOAL #1  ? Title Will be compliant with appropriate HEP to be progressed PRN   ? Status Partially Met   ?  ? PT SHORT TERM GOAL #2  ? Title Will be able to name 3 ways to reduce fall risk at home and in community   ? Status On-going   ? ?  ?  ? ?  ? ? ? ? PT Long Term Goals - 03/10/21 0910   ? ?  ? PT LONG TERM GOAL #1  ? Title MMT to improve by at least 1 grade in all weak groups   ? Status Achieved   ?  ? PT LONG TERM GOAL #2  ? Title Berg balance score to improve to at least 45 to show reduced fall risk/improved functional balance skills   ? Status Achieved   ?  ?  PT LONG TERM GOAL #3  ? Title Will be able to ascend and descend at least 4 steps with no UE support and step over step pattern with no more than min guard assist to improve community access   ? Status Partially Met   ?  ? PT LONG TERM GOAL #4  ? Title Will be able to ambulate at least 528f on indoor and outdoor surfaces with no more than supervision assist and no significant LOB   ? Status On-going   ? ?  ?  ? ?  ? ? ? ? ? ? ? ? Plan - 03/17/21 0928   ? ? Clinical Impression Statement Pt has progressed increasing his outdoor gait tolerance. He did required 2 standing and one sitting rest to complete. Decrease gait speed noted with uphill ambulation. Cue for eccentric control needed with resisted side steps. Pt had good stability with sit to stands on airex. Tactile cues needed  to prevent trunk flexion with standing shoulder extensions.   ? Personal Factors and Comorbidities Age;Fitness;Behavior Pattern;Social Background;Time since onset of injury/illness/exacerbation   ? Examination-Activity Limitations Locomotion Level;Reach Overhead;Bend;Squat;Stairs;Stand   ? Examination-Participation Restrictions Church;Community Activity;Interpersonal Relationship;Shop;Yard Work;Volunteer   ?  Stability/Clinical Decision Making Stable/Uncomplicated   ? Rehab Potential Good   ? PT Frequency 2x / week   ? PT Duration 8 weeks   ? PT Treatment/Interventions ADLs/Self Care Home Management;Cryotherapy;Electrical Stimulation;Iontophoresis 49mml Dexamethasone;Moist Heat;Ultrasound;DME Instruction;Gait training;Stair training;Functional mobility training;Therapeutic activities;Therapeutic exercise;Balance training;Neuromuscular re-education;Cognitive remediation;Patient/family education;Manual techniques;Energy conservation   ? PT Home Exercise Plan Access Code: [email or web address]: [email or web address]  Date: 03/17/2021  Prepared by: RoCheri Fowler  Exercises  Supine Bridge - 1 x daily - 7 x weekly - 3 sets - 10 reps  Side Stepping with Counter Support - 1 x daily - 7 x weekly - 3 sets - 10 reps  Mini Squat with Counter Support - 1 x daily - 7 x weekly - 3 sets - 10 reps  Standing March with Counter Support - 1 x daily - 7 x weekly - 3 sets - 10 reps   ? ?  ?  ? ?  ? ? ?Patient will benefit from skilled therapeutic intervention in order to improve the following deficits and impairments:  Abnormal gait, Difficulty walking, Decreased endurance, Decreased safety awareness, Decreased activity tolerance, Decreased balance, Decreased knowledge of use of DME, Impaired flexibility, Decreased mobility, Decreased strength, Postural dysfunction ? ?Visit Diagnosis: ?Difficulty in walking, not elsewhere classified ? ?Muscle weakness (generalized) ? ?Unsteadiness on feet ? ?Repeated falls ? ? ? ? ?Problem List ?Patient Active Problem List  ? Diagnosis Date Noted  ? Memory change 04/24/2020  ? Right inguinal hernia 09/05/2017  ? Urinary frequency 09/05/2017  ? Coronary artery disease involving native coronary artery 11/05/2013  ? Imbalance 11/05/2013  ? Routine health maintenance 08/22/2010  ? Hearing loss 08/22/2009  ? ATRIAL FIBRILLATION 06/27/2008  ? Hyperlipidemia 12/09/2006  ? Essential hypertension  12/09/2006  ? MYOCARDIAL INFARCTION, HX OF 12/09/2006  ? CAD (coronary artery disease) 12/09/2006  ? ? ?RoScot JunPTA ?03/17/2021, 9:31 AM ? ?[place] ?[address]Phone: [phone] Fax:  [phone] ?Name: Larry Lopez ?MRN: [id number] of Birth: 07/15/07/1928 ? ? ?

## 2021-03-19 ENCOUNTER — Ambulatory Visit: Payer: Medicare Other | Admitting: Physical Therapy

## 2021-03-19 ENCOUNTER — Encounter: Payer: Self-pay | Admitting: Physical Therapy

## 2021-03-19 ENCOUNTER — Other Ambulatory Visit: Payer: Self-pay

## 2021-03-19 DIAGNOSIS — R296 Repeated falls: Secondary | ICD-10-CM | POA: Diagnosis not present

## 2021-03-19 DIAGNOSIS — M6281 Muscle weakness (generalized): Secondary | ICD-10-CM | POA: Diagnosis not present

## 2021-03-19 DIAGNOSIS — R2681 Unsteadiness on feet: Secondary | ICD-10-CM

## 2021-03-19 DIAGNOSIS — R262 Difficulty in walking, not elsewhere classified: Secondary | ICD-10-CM | POA: Diagnosis not present

## 2021-03-19 NOTE — Therapy (Signed)
[place] ?[place] ?[address] ?[place], [place], [address] ?Phone: [phone]   Fax:  [phone] ? ?Physical Therapy Treatment ? ?Patient Details  ?Name: Larry Lopez ?MRN: [id number] ?Date of Birth: 08-23-1926 ?Referring Provider (PT): Pricilla Holm ? ? ?Encounter Date: 03/19/2021 ? ? PT End of Session - 03/19/21 0927   ? ? Visit Number 10   ? Date for PT Re-Evaluation 04/09/21   ? Authorization Type MCR and BCBS   ? Authorization Time Period 02/12/21 to 04/09/21   ? PT Start Time 0845   ? PT Stop Time 0930   ? PT Time Calculation (min) 45 min   ? Activity Tolerance Patient tolerated treatment well   ? Behavior During Therapy [facility] for tasks assessed/performed   ? ?  ?  ? ?  ? ? ?Past Medical History:  ?Diagnosis Date  ? Atrial fibrillation ([place])   ? Bradycardia   ? CAD (coronary artery disease)   ? Glaucoma   ? HLD (hyperlipidemia)   ? HTN (hypertension)   ? Tachy-brady syndrome ([place])   ? ? ?Past Surgical History:  ?Procedure Laterality Date  ? CARDIAC SURGERY    ? CARDIOVERSION N/A 02/06/2013  ? Procedure: CARDIOVERSION;  Surgeon: Jolaine Artist, MD;  Location: [facility] ENDOSCOPY;  Service: Cardiovascular;  Laterality: N/A;  ? CORONARY ARTERY BYPASS GRAFT    ? TONSILLECTOMY AND ADENOIDECTOMY    ? ? ?There were no vitals filed for this visit. ? ? Subjective Assessment - 03/19/21 0848   ? ? Subjective "Good" Compliant with updated HEP   ? Currently in Pain? Yes   ? ?  ?  ? ?  ? ? ? ? ? ? ? ? ? ? ? ? ? ? ? ? ? ? ? ? [place] Adult PT Treatment/Exercise - 03/19/21 0001   ? ?  ? High Level Balance  ? High Level Balance Comments side step over bodyblade  then with Red Tband resistance,   ?  ? Neuro Re-ed   ? Neuro Re-ed Details  On airex ball toss   ?  ? Lumbar Exercises: Aerobic  ? Recumbent Bike L2.5 x 6 min   ?  ? Lumbar Exercises: Standing  ? Shoulder Extension Power Tower;Both;20 reps;Strengthening   ? Shoulder Extension Limitations 10   ?  ? Lumbar Exercises: Seated  ?  Sit to Stand 10 reps   OHP yellow ball x2  ?  ? Knee/Hip Exercises: Standing  ? Heel Raises Both;2 sets;10 reps;2 seconds   ? Forward Step Up Both;2 sets;5 reps;Hand Hold: 0;Step Height: 6"   ? ?  ?  ? ?  ? ? ? ? ? ? ? ? ? ? ? ? PT Short Term Goals - 03/19/21 0928   ? ?  ? PT SHORT TERM GOAL #1  ? Title Will be compliant with appropriate HEP to be progressed PRN   ? Status Achieved   ?  ? PT SHORT TERM GOAL #2  ? Title Will be able to name 3 ways to reduce fall risk at home and in community   ? Status Achieved   ?  ? PT SHORT TERM GOAL #3  ? Title Will be able to maintain tandem stance for at least 15 seconds and single leg stance for at least 5 seconds B to show improved functional balance   ? Status On-going   ?  ? PT SHORT TERM GOAL #4  ? Title Gait pattern to  have significantly improved to include WNL step/stride lengths, good heel toe mechanics and foot clearance, and only occasional shuffling pattern with and without LRAD   ? Status Achieved   ? ?  ?  ? ?  ? ? ? ? PT Long Term Goals - 03/19/21 0928   ? ?  ? PT LONG TERM GOAL #1  ? Title MMT to improve by at least 1 grade in all weak groups   ? Status Achieved   ?  ? PT LONG TERM GOAL #2  ? Title Berg balance score to improve to at least 45 to show reduced fall risk/improved functional balance skills   ? Status Achieved   ?  ? PT LONG TERM GOAL #3  ? Title Will be able to ascend and descend at least 4 steps with no UE support and step over step pattern with no more than min guard assist to improve community access   ? Status Partially Met   ?  ? PT LONG TERM GOAL #4  ? Title Will be able to ambulate at least 535f on indoor and outdoor surfaces with no more than supervision assist and no significant LOB   ? Status On-going   ? ?  ?  ? ?  ? ? ? ? ? ? ? ? Plan - 03/19/21 0929   ? ? Clinical Impression Statement Pt continues to do well and progress towards goals. More challenging interventions with non compliant surfaces did cause some instability. Tactile cue to  maintain sequencing with step ups needed, pt would often put part of his foot on box when stepping up causing some instability. Fatigue reported with sit to stands. Standing marches on airex was challenging.   ? Personal Factors and Comorbidities Age;Fitness;Behavior Pattern;Social Background;Time since onset of injury/illness/exacerbation   ? Examination-Activity Limitations Locomotion Level;Reach Overhead;Bend;Squat;Stairs;Stand   ? Examination-Participation Restrictions Church;Community Activity;Interpersonal Relationship;Shop;Yard Work;Volunteer   ? Stability/Clinical Decision Making Stable/Uncomplicated   ? Rehab Potential Good   ? PT Frequency 2x / week   ? PT Duration 8 weeks   ? PT Treatment/Interventions ADLs/Self Care Home Management;Cryotherapy;Electrical Stimulation;Iontophoresis 456mml Dexamethasone;Moist Heat;Ultrasound;DME Instruction;Gait training;Stair training;Functional mobility training;Therapeutic activities;Therapeutic exercise;Balance training;Neuromuscular re-education;Cognitive remediation;Patient/family education;Manual techniques;Energy conservation   ? PT Next Visit Plan focus on strength and balance, continue gait training with LRAD   ? ?  ?  ? ?  ? ? ?Patient will benefit from skilled therapeutic intervention in order to improve the following deficits and impairments:  Abnormal gait, Difficulty walking, Decreased endurance, Decreased safety awareness, Decreased activity tolerance, Decreased balance, Decreased knowledge of use of DME, Impaired flexibility, Decreased mobility, Decreased strength, Postural dysfunction ? ?Visit Diagnosis: ?Difficulty in walking, not elsewhere classified ? ?Repeated falls ? ?Muscle weakness (generalized) ? ?Unsteadiness on feet ? ? ? ? ?Problem List ?Patient Active Problem List  ? Diagnosis Date Noted  ? Memory change 04/24/2020  ? Right inguinal hernia 09/05/2017  ? Urinary frequency 09/05/2017  ? Coronary artery disease involving native coronary artery  11/05/2013  ? Imbalance 11/05/2013  ? Routine health maintenance 08/22/2010  ? Hearing loss 08/22/2009  ? ATRIAL FIBRILLATION 06/27/2008  ? Hyperlipidemia 12/09/2006  ? Essential hypertension 12/09/2006  ? MYOCARDIAL INFARCTION, HX OF 12/09/2006  ? CAD (coronary artery disease) 12/09/2006  ? ? ?RoScot JunPTA ?03/19/2021, 9:31 AM ? ?[place] [address]Phone: [phone] Fax:  [phone] ?Name: RaShahir Karenurcot ?MRN: [id number] of  Birth: 10/05/1926 ? ? ? ?

## 2021-03-23 ENCOUNTER — Ambulatory Visit: Payer: Medicare Other | Admitting: Physical Therapy

## 2021-03-31 ENCOUNTER — Ambulatory Visit: Payer: Medicare Other | Admitting: Physical Therapy

## 2021-03-31 ENCOUNTER — Other Ambulatory Visit: Payer: Self-pay

## 2021-03-31 ENCOUNTER — Encounter: Payer: Self-pay | Admitting: Physical Therapy

## 2021-03-31 DIAGNOSIS — R296 Repeated falls: Secondary | ICD-10-CM | POA: Diagnosis not present

## 2021-03-31 DIAGNOSIS — R262 Difficulty in walking, not elsewhere classified: Secondary | ICD-10-CM

## 2021-03-31 DIAGNOSIS — R2681 Unsteadiness on feet: Secondary | ICD-10-CM | POA: Diagnosis not present

## 2021-03-31 DIAGNOSIS — M6281 Muscle weakness (generalized): Secondary | ICD-10-CM | POA: Diagnosis not present

## 2021-03-31 NOTE — Therapy (Signed)
[place] ?[place] ?[address] ?[place], [place], [address] ?Phone: [phone]   Fax:  [phone] ? ?Physical Therapy Treatment ? ?Patient Details  ?Name: Larry Lopez ?MRN: [id number] ?Date of Birth: [birth year] ?Referring Provider (PT): Pricilla Holm ? ? ?Encounter Date: 03/31/2021 ? ? PT End of Session - 03/31/21 1013   ? ? Visit Number 11   ? Date for PT Re-Evaluation 04/09/21   ? PT Start Time 0930   ? PT Stop Time 1015   ? PT Time Calculation (min) 45 min   ? Activity Tolerance Patient tolerated treatment well   ? Behavior During Therapy [facility] for tasks assessed/performed   ? ?  ?  ? ?  ? ? ?Past Medical History:  ?Diagnosis Date  ? Atrial fibrillation ([place])   ? Bradycardia   ? CAD (coronary artery disease)   ? Glaucoma   ? HLD (hyperlipidemia)   ? HTN (hypertension)   ? Tachy-brady syndrome ([place])   ? ? ?Past Surgical History:  ?Procedure Laterality Date  ? CARDIAC SURGERY    ? CARDIOVERSION N/A 02/06/2013  ? Procedure: CARDIOVERSION;  Surgeon: Jolaine Artist, MD;  Location: [facility] ENDOSCOPY;  Service: Cardiovascular;  Laterality: N/A;  ? CORONARY ARTERY BYPASS GRAFT    ? TONSILLECTOMY AND ADENOIDECTOMY    ? ? ?There were no vitals filed for this visit. ? ? Subjective Assessment - 03/31/21 0933   ? ? Subjective "Good" no issues   ? Currently in Pain? No/denies   ? ?  ?  ? ?  ? ? ? ? ? ? ? ? ? ? ? ? ? ? ? ? ? ? ? ? [place] Adult PT Treatment/Exercise - 03/31/21 0001   ? ?  ? Neuro Re-ed   ? Neuro Re-ed Details  On airex ball toss   ?  ? Lumbar Exercises: Aerobic  ? Nustep L5 x 6 min   ?  ? Lumbar Exercises: Machines for Strengthening  ? Cybex Knee Extension 15lb 2x10   ? Cybex Knee Flexion 35lb 2x10   ?  ? Lumbar Exercises: Seated  ? Sit to Stand 10 reps   on airex  ?  ? Knee/Hip Exercises: Standing  ? Lateral Step Up Both;1 set;10 reps;Hand Hold: 0;Step Height: 6"   CGA  ? Walking with Sports Cord 30lb 4 way x 3 each   ? ?  ?  ? ?  ? ? ? ? ? ? ? ? ? ? ? ? PT Short  Term Goals - 03/19/21 0928   ? ?  ? PT SHORT TERM GOAL #1  ? Title Will be compliant with appropriate HEP to be progressed PRN   ? Status Achieved   ?  ? PT SHORT TERM GOAL #2  ? Title Will be able to name 3 ways to reduce fall risk at home and in community   ? Status Achieved   ?  ? PT SHORT TERM GOAL #3  ? Title Will be able to maintain tandem stance for at least 15 seconds and single leg stance for at least 5 seconds B to show improved functional balance   ? Status On-going   ?  ? PT SHORT TERM GOAL #4  ? Title Gait pattern to have significantly improved to include WNL step/stride lengths, good heel toe mechanics and foot clearance, and only occasional shuffling pattern with and without LRAD   ? Status Achieved   ? ?  ?  ? ?  ? ? ? ?  PT Long Term Goals - 03/19/21 0928   ? ?  ? PT LONG TERM GOAL #1  ? Title MMT to improve by at least 1 grade in all weak groups   ? Status Achieved   ?  ? PT LONG TERM GOAL #2  ? Title Berg balance score to improve to at least 45 to show reduced fall risk/improved functional balance skills   ? Status Achieved   ?  ? PT LONG TERM GOAL #3  ? Title Will be able to ascend and descend at least 4 steps with no UE support and step over step pattern with no more than min guard assist to improve community access   ? Status Partially Met   ?  ? PT LONG TERM GOAL #4  ? Title Will be able to ambulate at least 535f on indoor and outdoor surfaces with no more than supervision assist and no significant LOB   ? Status On-going   ? ?  ?  ? ?  ? ? ? ? ? ? ? ? Plan - 03/31/21 1013   ? ? Clinical Impression Statement Pt enters clinic feeling well with no new issues. Better stability and control noted with resisted gait compared to previous sessions. Some instability noted with lateral step ups requiring CGA to complete reps at times. Cues provided to complete full ROM with seated leg extensions. Postural weakness present with shoulder extensions.   ? Personal Factors and Comorbidities  Age;Fitness;Behavior Pattern;Social Background;Time since onset of injury/illness/exacerbation   ? Examination-Activity Limitations Locomotion Level;Reach Overhead;Bend;Squat;Stairs;Stand   ? Examination-Participation Restrictions Church;Community Activity;Interpersonal Relationship;Shop;Yard Work;Volunteer   ? Stability/Clinical Decision Making Stable/Uncomplicated   ? Rehab Potential Good   ? PT Frequency 2x / week   ? PT Treatment/Interventions ADLs/Self Care Home Management;Cryotherapy;Electrical Stimulation;Iontophoresis 448mml Dexamethasone;Moist Heat;Ultrasound;DME Instruction;Gait training;Stair training;Functional mobility training;Therapeutic activities;Therapeutic exercise;Balance training;Neuromuscular re-education;Cognitive remediation;Patient/family education;Manual techniques;Energy conservation   ? PT Next Visit Plan focus on strength and balance, continue gait training with LRAD   ? ?  ?  ? ?  ? ? ?Patient will benefit from skilled therapeutic intervention in order to improve the following deficits and impairments:  Abnormal gait, Difficulty walking, Decreased endurance, Decreased safety awareness, Decreased activity tolerance, Decreased balance, Decreased knowledge of use of DME, Impaired flexibility, Decreased mobility, Decreased strength, Postural dysfunction ? ?Visit Diagnosis: ?Difficulty in walking, not elsewhere classified ? ?Unsteadiness on feet ? ?Muscle weakness (generalized) ? ?Repeated falls ? ? ? ? ?Problem List ?Patient Active Problem List  ? Diagnosis Date Noted  ? Memory change 04/24/2020  ? Right inguinal hernia 09/05/2017  ? Urinary frequency 09/05/2017  ? Coronary artery disease involving native coronary artery 11/05/2013  ? Imbalance 11/05/2013  ? Routine health maintenance 08/22/2010  ? Hearing loss 08/22/2009  ? ATRIAL FIBRILLATION 06/27/2008  ? Hyperlipidemia 12/09/2006  ? Essential hypertension 12/09/2006  ? MYOCARDIAL INFARCTION, HX OF 12/09/2006  ? CAD (coronary artery  disease) 12/09/2006  ? ? ?RoScot JunPTA ?03/31/2021, 10:16 AM ? ?[place] ?[address]Phone: [phone] Fax:  [phone] ?Name: Larry Lopez ?MRN: [id number] of Birth: 07/17/22/1928 ? ? ?

## 2021-04-02 ENCOUNTER — Encounter: Payer: Self-pay | Admitting: Physical Therapy

## 2021-04-02 ENCOUNTER — Other Ambulatory Visit: Payer: Self-pay

## 2021-04-02 ENCOUNTER — Ambulatory Visit: Payer: Medicare Other | Admitting: Physical Therapy

## 2021-04-02 DIAGNOSIS — R262 Difficulty in walking, not elsewhere classified: Secondary | ICD-10-CM | POA: Diagnosis not present

## 2021-04-02 DIAGNOSIS — R296 Repeated falls: Secondary | ICD-10-CM

## 2021-04-02 DIAGNOSIS — R2681 Unsteadiness on feet: Secondary | ICD-10-CM | POA: Diagnosis not present

## 2021-04-02 DIAGNOSIS — M6281 Muscle weakness (generalized): Secondary | ICD-10-CM

## 2021-04-02 NOTE — Therapy (Signed)
[place] ?[place] ?[address] ?[place], [place], [address] ?Phone: [phone]   Fax:  [phone] ? ?Physical Therapy Treatment ? ?Patient Details  ?Name: Larry Lopez ?MRN: [id number] ?Date of Birth: 08/03/1926 ?Referring Provider (PT): Pricilla Holm ? ? ?Encounter Date: 04/02/2021 ? ? PT End of Session - 04/02/21 1012   ? ? Visit Number 12   ? Date for PT Re-Evaluation 04/09/21   ? Authorization Type MCR and BCBS   ? Authorization Time Period 02/12/21 to 04/09/21   ? PT Start Time 0930   ? PT Stop Time 1012   ? PT Time Calculation (min) 42 min   ? Activity Tolerance Patient tolerated treatment well   ? Behavior During Therapy [facility] for tasks assessed/performed   ? ?  ?  ? ?  ? ? ?Past Medical History:  ?Diagnosis Date  ? Atrial fibrillation ([place])   ? Bradycardia   ? CAD (coronary artery disease)   ? Glaucoma   ? HLD (hyperlipidemia)   ? HTN (hypertension)   ? Tachy-brady syndrome ([place])   ? ? ?Past Surgical History:  ?Procedure Laterality Date  ? CARDIAC SURGERY    ? CARDIOVERSION N/A 02/06/2013  ? Procedure: CARDIOVERSION;  Surgeon: Jolaine Artist, MD;  Location: [facility] ENDOSCOPY;  Service: Cardiovascular;  Laterality: N/A;  ? CORONARY ARTERY BYPASS GRAFT    ? TONSILLECTOMY AND ADENOIDECTOMY    ? ? ?There were no vitals filed for this visit. ? ? Subjective Assessment - 04/02/21 0933   ? ? Subjective "Good"   ? Currently in Pain? No/denies   ? ?  ?  ? ?  ? ? ? ? ? ? ? ? ? ? ? ? ? ? ? ? ? ? ? ? [place] Adult PT Treatment/Exercise - 04/02/21 0001   ? ?  ? Ambulation/Gait  ? Gait Comments Gait outside up and down slope L side of parking lot   ?  ? High Level Balance  ? High Level Balance Activities Negotitating around obstacles;Negotiating over obstacles   ?  ? Neuro Re-ed   ? Neuro Re-ed Details  On airex weighted ball toss   ?  ? Lumbar Exercises: Aerobic  ? Recumbent Bike L3.3 x5 min   ?  ? Lumbar Exercises: Machines for Strengthening  ? Cybex Knee Extension 15lb 2x12    ? Cybex Knee Flexion 35lb 2x12   ?  ? Knee/Hip Exercises: Standing  ? Walking with Sports Cord 30lb side steps x5 each   ? Other Standing Knee Exercises Alt 6 in box taps from airex   ? ?  ?  ? ?  ? ? ? ? ? ? ? ? ? ? ? ? PT Short Term Goals - 03/19/21 0928   ? ?  ? PT SHORT TERM GOAL #1  ? Title Will be compliant with appropriate HEP to be progressed PRN   ? Status Achieved   ?  ? PT SHORT TERM GOAL #2  ? Title Will be able to name 3 ways to reduce fall risk at home and in community   ? Status Achieved   ?  ? PT SHORT TERM GOAL #3  ? Title Will be able to maintain tandem stance for at least 15 seconds and single leg stance for at least 5 seconds B to show improved functional balance   ? Status On-going   ?  ? PT SHORT TERM GOAL #4  ? Title Gait pattern to have  significantly improved to include WNL step/stride lengths, good heel toe mechanics and foot clearance, and only occasional shuffling pattern with and without LRAD   ? Status Achieved   ? ?  ?  ? ?  ? ? ? ? PT Long Term Goals - 03/19/21 0928   ? ?  ? PT LONG TERM GOAL #1  ? Title MMT to improve by at least 1 grade in all weak groups   ? Status Achieved   ?  ? PT LONG TERM GOAL #2  ? Title Berg balance score to improve to at least 45 to show reduced fall risk/improved functional balance skills   ? Status Achieved   ?  ? PT LONG TERM GOAL #3  ? Title Will be able to ascend and descend at least 4 steps with no UE support and step over step pattern with no more than min guard assist to improve community access   ? Status Partially Met   ?  ? PT LONG TERM GOAL #4  ? Title Will be able to ambulate at least 522f on indoor and outdoor surfaces with no more than supervision assist and no significant LOB   ? Status On-going   ? ?  ?  ? ?  ? ? ? ? ? ? ? ? Plan - 04/02/21 1013   ? ? Clinical Impression Statement Pt did well overall today with good righting reaction when he loses his balance. Pt did well controlling gait speed with down hill ambulation after receiving  cues. up hill ambulation was taxing on pt. Pt became most unstable today with non complaint surfaces.   ? Personal Factors and Comorbidities Age;Fitness;Behavior Pattern;Social Background;Time since onset of injury/illness/exacerbation   ? Examination-Activity Limitations Locomotion Level;Reach Overhead;Bend;Squat;Stairs;Stand   ? Examination-Participation Restrictions Church;Community Activity;Interpersonal Relationship;Shop;Yard Work;Volunteer   ? Stability/Clinical Decision Making Stable/Uncomplicated   ? Rehab Potential Good   ? PT Frequency 2x / week   ? PT Duration 8 weeks   ? PT Treatment/Interventions ADLs/Self Care Home Management;Cryotherapy;Electrical Stimulation;Iontophoresis 43mml Dexamethasone;Moist Heat;Ultrasound;DME Instruction;Gait training;Stair training;Functional mobility training;Therapeutic activities;Therapeutic exercise;Balance training;Neuromuscular re-education;Cognitive remediation;Patient/family education;Manual techniques;Energy conservation   ? PT Next Visit Plan focus on strength and balance, continue gait training with LRAD   ? ?  ?  ? ?  ? ? ?Patient will benefit from skilled therapeutic intervention in order to improve the following deficits and impairments:  Abnormal gait, Difficulty walking, Decreased endurance, Decreased safety awareness, Decreased activity tolerance, Decreased balance, Decreased knowledge of use of DME, Impaired flexibility, Decreased mobility, Decreased strength, Postural dysfunction ? ?Visit Diagnosis: ?Difficulty in walking, not elsewhere classified ? ?Muscle weakness (generalized) ? ?Repeated falls ? ?Unsteadiness on feet ? ? ? ? ?Problem List ?Patient Active Problem List  ? Diagnosis Date Noted  ? Memory change 04/24/2020  ? Right inguinal hernia 09/05/2017  ? Urinary frequency 09/05/2017  ? Coronary artery disease involving native coronary artery 11/05/2013  ? Imbalance 11/05/2013  ? Routine health maintenance 08/22/2010  ? Hearing loss 08/22/2009  ?  ATRIAL FIBRILLATION 06/27/2008  ? Hyperlipidemia 12/09/2006  ? Essential hypertension 12/09/2006  ? MYOCARDIAL INFARCTION, HX OF 12/09/2006  ? CAD (coronary artery disease) 12/09/2006  ? ? ?RoScot JunPTA ?04/02/2021, 10:19 AM ? ?[place] ?[address]Phone: [phone] Fax:  [phone] ?Name: Larry Lopez ?MRN: [id number] of Birth: 7/Jul 13, 1926 ? ? ?

## 2021-04-07 ENCOUNTER — Ambulatory Visit: Payer: Medicare Other | Admitting: Physical Therapy

## 2021-04-07 ENCOUNTER — Encounter: Payer: Self-pay | Admitting: Physical Therapy

## 2021-04-07 ENCOUNTER — Other Ambulatory Visit: Payer: Self-pay

## 2021-04-07 DIAGNOSIS — R262 Difficulty in walking, not elsewhere classified: Secondary | ICD-10-CM

## 2021-04-07 DIAGNOSIS — R296 Repeated falls: Secondary | ICD-10-CM

## 2021-04-07 DIAGNOSIS — R2681 Unsteadiness on feet: Secondary | ICD-10-CM

## 2021-04-07 DIAGNOSIS — M6281 Muscle weakness (generalized): Secondary | ICD-10-CM

## 2021-04-07 NOTE — Therapy (Signed)
[facility] ?[place] [address] ?[place], [place], [address] ?Phone: [phone]   Fax:  [phone] ? ?Physical Therapy Treatment ? ?Patient Details  ?Name: Larry Lopez ?MRN: [id number] ?Date of Birth: [birth year] ?Referring Provider (PT): Pricilla Holm ? ? ?Encounter Date: 04/07/2021 ? ? PT End of Session - 04/07/21 1008   ? ? Visit Number 13   ? Date for PT Re-Evaluation 04/09/21   ? Authorization Type MCR and BCBS   ? Authorization Time Period 02/12/21 to 04/09/21   ? PT Start Time 0930   ? PT Stop Time 1015   ? PT Time Calculation (min) 45 min   ? Activity Tolerance Patient tolerated treatment well   ? Behavior During Therapy [facility] for tasks assessed/performed   ? ?  ?  ? ?  ? ? ?Past Medical History:  ?Diagnosis Date  ? Atrial fibrillation ([place])   ? Bradycardia   ? CAD (coronary artery disease)   ? Glaucoma   ? HLD (hyperlipidemia)   ? HTN (hypertension)   ? Tachy-brady syndrome ([place])   ? ? ?Past Surgical History:  ?Procedure Laterality Date  ? CARDIAC SURGERY    ? CARDIOVERSION N/A 02/06/2013  ? Procedure: CARDIOVERSION;  Surgeon: Jolaine Artist, MD;  Location: [facility] ENDOSCOPY;  Service: Cardiovascular;  Laterality: N/A;  ? CORONARY ARTERY BYPASS GRAFT    ? TONSILLECTOMY AND ADENOIDECTOMY    ? ? ?There were no vitals filed for this visit. ? ? Subjective Assessment - 04/07/21 0931   ? ? Subjective "Good"   ? Currently in Pain? No/denies   ? ?  ?  ? ?  ? ? ? ? ? ? ? ? ? ? ? ? ? ? ? ? ? ? ? ? [place] Adult PT Treatment/Exercise - 04/07/21 0001   ? ?  ? Ambulation/Gait  ? Ambulation/Gait Yes   ? Ambulation/Gait Assistance 5: Supervision   ? Assistive device None   ? Gait Pattern Step-through pattern;Left flexed knee in stance;Right flexed knee in stance;Shuffle;Trunk flexed   some shuffling  ? Stairs Yes   ? Stairs Assistance 5: Supervision;4: Min guard   ? Stair Management Technique One rail Right;Alternating pattern   step to pattern without rails  ? Number of  Stairs 48   ? Height of Stairs 6   ? Gait Comments gait outside around back building. slow speed   ?  ? Neuro Re-ed   ? Neuro Re-ed Details  On airex weighted ball toss   some with weighted ball  ?  ? Lumbar Exercises: Aerobic  ? Recumbent Bike L3 x6 min   ?  ? Lumbar Exercises: Machines for Strengthening  ? Cybex Knee Extension 15lb 2x12   ? Cybex Knee Flexion 35lb 2x12   ?  ? Lumbar Exercises: Seated  ? Sit to Stand 10 reps   x2 on airex with weighted ball chest press  ? ?  ?  ? ?  ? ? ? ? ? ? ? ? ? ? ? ? PT Short Term Goals - 03/19/21 0928   ? ?  ? PT SHORT TERM GOAL #1  ? Title Will be compliant with appropriate HEP to be progressed PRN   ? Status Achieved   ?  ? PT SHORT TERM GOAL #2  ? Title Will be able to name 3 ways to reduce fall risk at home and in community   ? Status Achieved   ?  ? PT SHORT TERM  GOAL #3  ? Title Will be able to maintain tandem stance for at least 15 seconds and single leg stance for at least 5 seconds B to show improved functional balance   ? Status On-going   ?  ? PT SHORT TERM GOAL #4  ? Title Gait pattern to have significantly improved to include WNL step/stride lengths, good heel toe mechanics and foot clearance, and only occasional shuffling pattern with and without LRAD   ? Status Achieved   ? ?  ?  ? ?  ? ? ? ? PT Long Term Goals - 04/07/21 0935   ? ?  ? PT LONG TERM GOAL #1  ? Title MMT to improve by at least 1 grade in all weak groups   ? Status Achieved   ?  ? PT LONG TERM GOAL #2  ? Title Berg balance score to improve to at least 45 to show reduced fall risk/improved functional balance skills   ? Status Achieved   ?  ? PT LONG TERM GOAL #3  ? Title Will be able to ascend and descend at least 4 steps with no UE support and step over step pattern with no more than min guard assist to improve community access   ? Status Partially Met   ? ?  ?  ? ?  ? ? ? ? ? ? ? ? Plan - 04/07/21 1009   ? ? Clinical Impression Statement Pt continues to do well overall. Progressed to more stair  negotiation and outdoor ambulation. He doe well with stairs while using hand rail but becomes unsteady with when ask to complete without using rail. No LOB ambulating outdoor up and down slope but pt does have slow gait speed with a shuffling gait,good compliance with cues. Pt continues to required cue when going up and down curbs to get closer to curb before negotiating it to prevent over stepping   ? Personal Factors and Comorbidities Age;Fitness;Behavior Pattern;Social Background;Time since onset of injury/illness/exacerbation   ? Examination-Activity Limitations Locomotion Level;Reach Overhead;Bend;Squat;Stairs;Stand   ? Examination-Participation Restrictions Church;Community Activity;Interpersonal Relationship;Shop;Yard Work;Volunteer   ? Stability/Clinical Decision Making Stable/Uncomplicated   ? Rehab Potential Good   ? PT Frequency 2x / week   ? PT Duration 8 weeks   ? PT Next Visit Plan focus on strength and balance, continue gait training with LRAD   ? ?  ?  ? ?  ? ? ?Patient will benefit from skilled therapeutic intervention in order to improve the following deficits and impairments:  Abnormal gait, Difficulty walking, Decreased endurance, Decreased safety awareness, Decreased activity tolerance, Decreased balance, Decreased knowledge of use of DME, Impaired flexibility, Decreased mobility, Decreased strength, Postural dysfunction ? ?Visit Diagnosis: ?Difficulty in walking, not elsewhere classified ? ?Repeated falls ? ?Muscle weakness (generalized) ? ?Unsteadiness on feet ? ? ? ? ?Problem List ?Patient Active Problem List  ? Diagnosis Date Noted  ? Memory change 04/24/2020  ? Right inguinal hernia 09/05/2017  ? Urinary frequency 09/05/2017  ? Coronary artery disease involving native coronary artery 11/05/2013  ? Imbalance 11/05/2013  ? Routine health maintenance 08/22/2010  ? Hearing loss 08/22/2009  ? ATRIAL FIBRILLATION 06/27/2008  ? Hyperlipidemia 12/09/2006  ? Essential hypertension 12/09/2006  ?  MYOCARDIAL INFARCTION, HX OF 12/09/2006  ? CAD (coronary artery disease) 12/09/2006  ? ? ?Scot Jun, PTA ?04/07/2021, 10:13 AM ? ?[place] ?[address] ?[place], [place], [address] ?Phone: [phone]   Fax:  [phone] ? ?  Name: Larry Lopez ?MRN: [id number] ?Date of Birth: 10/04/1926 ? ? ? ?

## 2021-04-09 ENCOUNTER — Ambulatory Visit: Payer: Medicare Other | Admitting: Physical Therapy

## 2021-04-09 ENCOUNTER — Encounter: Payer: Self-pay | Admitting: Physical Therapy

## 2021-04-09 DIAGNOSIS — R2681 Unsteadiness on feet: Secondary | ICD-10-CM

## 2021-04-09 DIAGNOSIS — R296 Repeated falls: Secondary | ICD-10-CM

## 2021-04-09 DIAGNOSIS — R262 Difficulty in walking, not elsewhere classified: Secondary | ICD-10-CM | POA: Diagnosis not present

## 2021-04-09 DIAGNOSIS — M6281 Muscle weakness (generalized): Secondary | ICD-10-CM | POA: Diagnosis not present

## 2021-04-09 NOTE — Therapy (Signed)
[place] ?[place] ?[address] ?[place], [place], [address] ?Phone: [phone]   Fax:  [phone] ? ?Physical Therapy Treatment ? ?Patient Details  ?Name: Larry Lopez ?MRN: [id number] ?Date of Birth: 07-14-1926 ?Referring Provider (PT): Pricilla Holm ? ? ?Encounter Date: 04/09/2021 ? ? PT End of Session - 04/09/21 1009   ? ? Visit Number 14   ? Date for PT Re-Evaluation 05/11/21   ? Authorization Time Period 02/12/21 to 04/09/21   ? PT Start Time 0930   ? PT Stop Time 1015   ? PT Time Calculation (min) 45 min   ? Activity Tolerance Patient tolerated treatment well   ? Behavior During Therapy [facility] for tasks assessed/performed   ? ?  ?  ? ?  ? ? ?Past Medical History:  ?Diagnosis Date  ? Atrial fibrillation ([place])   ? Bradycardia   ? CAD (coronary artery disease)   ? Glaucoma   ? HLD (hyperlipidemia)   ? HTN (hypertension)   ? Tachy-brady syndrome ([place])   ? ? ?Past Surgical History:  ?Procedure Laterality Date  ? CARDIAC SURGERY    ? CARDIOVERSION N/A 02/06/2013  ? Procedure: CARDIOVERSION;  Surgeon: Jolaine Artist, MD;  Location: [facility] ENDOSCOPY;  Service: Cardiovascular;  Laterality: N/A;  ? CORONARY ARTERY BYPASS GRAFT    ? TONSILLECTOMY AND ADENOIDECTOMY    ? ? ?There were no vitals filed for this visit. ? ? Subjective Assessment - 04/09/21 0934   ? ? Subjective "Good" "I never feel bad"   ? Currently in Pain? No/denies   ? ?  ?  ? ?  ? ? ? ? ? [facility] PT Assessment - 04/09/21 0001   ? ?  ? Assessment  ? Medical Diagnosis imbalance   ? Referring Provider (PT) Pricilla Holm   ? ?  ?  ? ?  ? ? ? ? ? ? ? ? ? ? ? ? ? ? ? ? [place] Adult PT Treatment/Exercise - 04/09/21 0001   ? ?  ? High Level Balance  ? High Level Balance Comments side steps on and off airex   ?  ? Neuro Re-ed   ? Neuro Re-ed Details  On airex weighted ball toss   ?  ? Lumbar Exercises: Aerobic  ? Recumbent Bike L4 x6 min   ?  ? Lumbar Exercises: Machines for Strengthening  ? Cybex Knee Extension  15lb 2x12   ? Cybex Knee Flexion 35lb 2x12   ? Leg Press 30lb 3x10   ?  ? Lumbar Exercises: Standing  ? Shoulder Extension Power Tower;Both;20 reps;Strengthening   ? Shoulder Extension Limitations 10   ?  ? Lumbar Exercises: Seated  ? Sit to Stand 10 reps   x2 LE on airex  ?  ? Knee/Hip Exercises: Standing  ? Forward Step Up Both;1 set;5 reps;Hand Hold: 1;Step Height: 6"   from airex  ? ?  ?  ? ?  ? ? ? ? ? ? ? ? ? ? ? ? PT Short Term Goals - 03/19/21 0928   ? ?  ? PT SHORT TERM GOAL #1  ? Title Will be compliant with appropriate HEP to be progressed PRN   ? Status Achieved   ?  ? PT SHORT TERM GOAL #2  ? Title Will be able to name 3 ways to reduce fall risk at home and in community   ? Status Achieved   ?  ? PT SHORT TERM GOAL #3  ?  Title Will be able to maintain tandem stance for at least 15 seconds and single leg stance for at least 5 seconds B to show improved functional balance   ? Status On-going   ?  ? PT SHORT TERM GOAL #4  ? Title Gait pattern to have significantly improved to include WNL step/stride lengths, good heel toe mechanics and foot clearance, and only occasional shuffling pattern with and without LRAD   ? Status Achieved   ? ?  ?  ? ?  ? ? ? ? PT Long Term Goals - 04/07/21 0935   ? ?  ? PT LONG TERM GOAL #1  ? Title MMT to improve by at least 1 grade in all weak groups   ? Status Achieved   ?  ? PT LONG TERM GOAL #2  ? Title Berg balance score to improve to at least 45 to show reduced fall risk/improved functional balance skills   ? Status Achieved   ?  ? PT LONG TERM GOAL #3  ? Title Will be able to ascend and descend at least 4 steps with no UE support and step over step pattern with no more than min guard assist to improve community access   ? Status Partially Met   ? ?  ?  ? ?  ? ? ? ? ? ? ? ? Plan - 04/09/21 1009   ? ? Clinical Impression Statement Pt abe to complete today's interventions. He did well standing on airex with ball toss with good righting reaction, but has difficulty with  stepping on airex. CGA to min guard needed with step ups from airex. No issues with machine level interventions.   ? Personal Factors and Comorbidities Age;Fitness;Behavior Pattern;Social Background;Time since onset of injury/illness/exacerbation   ? Examination-Participation Restrictions Church;Community Activity;Interpersonal Relationship;Shop;Yard Work;Volunteer   ? Stability/Clinical Decision Making Stable/Uncomplicated   ? Rehab Potential Good   ? PT Frequency 2x / week   ? PT Duration 8 weeks   ? PT Treatment/Interventions ADLs/Self Care Home Management;Cryotherapy;Electrical Stimulation;Iontophoresis 35m/ml Dexamethasone;Moist Heat;Ultrasound;DME Instruction;Gait training;Stair training;Functional mobility training;Therapeutic activities;Therapeutic exercise;Balance training;Neuromuscular re-education;Cognitive remediation;Patient/family education;Manual techniques;Energy conservation   ? PT Next Visit Plan focus on strength and balance, continue gait training with LRAD   ? ?  ?  ? ?  ? ? ?Patient will benefit from skilled therapeutic intervention in order to improve the following deficits and impairments:  Abnormal gait, Difficulty walking, Decreased endurance, Decreased safety awareness, Decreased activity tolerance, Decreased balance, Decreased knowledge of use of DME, Impaired flexibility, Decreased mobility, Decreased strength, Postural dysfunction ? ?Visit Diagnosis: ?Difficulty in walking, not elsewhere classified - Plan: PT plan of care cert/re-cert ? ?Muscle weakness (generalized) - Plan: PT plan of care cert/re-cert ? ?Unsteadiness on feet - Plan: PT plan of care cert/re-cert ? ?Repeated falls - Plan: PT plan of care cert/re-cert ? ? ? ? ?Problem List ?Patient Active Problem List  ? Diagnosis Date Noted  ? Memory change 04/24/2020  ? Right inguinal hernia 09/05/2017  ? Urinary frequency 09/05/2017  ? Coronary artery disease involving native coronary artery 11/05/2013  ? Imbalance 11/05/2013  ?  Routine health maintenance 08/22/2010  ? Hearing loss 08/22/2009  ? ATRIAL FIBRILLATION 06/27/2008  ? Hyperlipidemia 12/09/2006  ? Essential hypertension 12/09/2006  ? MYOCARDIAL INFARCTION, HX OF 12/09/2006  ? CAD (coronary artery disease) 12/09/2006  ? ? ?Sumner Boast PT ?04/09/2021, 12:53 PM ? ?[place] ?[address] ?[place] [address]?Phone: [phone]  Fax:  [phone] ? ?Name: Larry Lopez ?MRN: [id number] ?Date of Birth: 1927-01-04 ? ? ? ?

## 2021-04-14 ENCOUNTER — Encounter: Payer: Self-pay | Admitting: Physical Therapy

## 2021-04-14 ENCOUNTER — Ambulatory Visit: Payer: Medicare Other | Attending: Internal Medicine | Admitting: Physical Therapy

## 2021-04-14 DIAGNOSIS — R2681 Unsteadiness on feet: Secondary | ICD-10-CM | POA: Diagnosis not present

## 2021-04-14 DIAGNOSIS — R262 Difficulty in walking, not elsewhere classified: Secondary | ICD-10-CM | POA: Insufficient documentation

## 2021-04-14 DIAGNOSIS — R296 Repeated falls: Secondary | ICD-10-CM | POA: Diagnosis not present

## 2021-04-14 DIAGNOSIS — M6281 Muscle weakness (generalized): Secondary | ICD-10-CM | POA: Insufficient documentation

## 2021-04-14 NOTE — Therapy (Signed)
[place] ?[place] ?[address] ?[place], [place], [address] ?Phone: [phone]   Fax:  [phone] ? ?Physical Therapy Treatment ? ?Patient Details  ?Name: Larry Lopez ?MRN: [id number] ?Date of Birth: [birth year] ?Referring Provider (PT): Pricilla Holm ? ? ?Encounter Date: 04/14/2021 ? ? PT End of Session - 04/14/21 0925   ? ? Visit Number 15   ? Date for PT Re-Evaluation 05/11/21   ? Authorization Type MCR and BCBS   ? PT Start Time 0845   ? PT Stop Time [phone]   ? PT Time Calculation (min) 43 min   ? Activity Tolerance Patient tolerated treatment well   ? Behavior During Therapy [facility] for tasks assessed/performed   ? ?  ?  ? ?  ? ? ?Past Medical History:  ?Diagnosis Date  ? Atrial fibrillation ([place])   ? Bradycardia   ? CAD (coronary artery disease)   ? Glaucoma   ? HLD (hyperlipidemia)   ? HTN (hypertension)   ? Tachy-brady syndrome ([place])   ? ? ?Past Surgical History:  ?Procedure Laterality Date  ? CARDIAC SURGERY    ? CARDIOVERSION N/A 02/06/2013  ? Procedure: CARDIOVERSION;  Surgeon: Jolaine Artist, MD;  Location: [facility] ENDOSCOPY;  Service: Cardiovascular;  Laterality: N/A;  ? CORONARY ARTERY BYPASS GRAFT    ? TONSILLECTOMY AND ADENOIDECTOMY    ? ? ?There were no vitals filed for this visit. ? ? Subjective Assessment - 04/14/21 0849   ? ? Subjective "Good"   ? Currently in Pain? No/denies   ? ?  ?  ? ?  ? ? ? ? ? ? ? ? ? ? ? ? ? ? ? ? ? ? ? ? [place] Adult PT Treatment/Exercise - 04/14/21 0001   ? ?  ? High Level Balance  ? High Level Balance Comments side steps on and off airex   ?  ? Lumbar Exercises: Aerobic  ? Recumbent Bike L4 x6 min   ?  ? Lumbar Exercises: Machines for Strengthening  ? Cybex Knee Extension 15lb 2x12   ? Cybex Knee Flexion 35lb 2x12   ? Leg Press 40lb 3x10   ?  ? Lumbar Exercises: Seated  ? Sit to Stand 10 reps   x2 LE on airex  ? ?  ?  ? ?  ? ? ? ? ? ? ? ? ? ? ? ? PT Short Term Goals - 03/19/21 0928   ? ?  ? PT SHORT TERM GOAL #1  ? Title Will  be compliant with appropriate HEP to be progressed PRN   ? Status Achieved   ?  ? PT SHORT TERM GOAL #2  ? Title Will be able to name 3 ways to reduce fall risk at home and in community   ? Status Achieved   ?  ? PT SHORT TERM GOAL #3  ? Title Will be able to maintain tandem stance for at least 15 seconds and single leg stance for at least 5 seconds B to show improved functional balance   ? Status On-going   ?  ? PT SHORT TERM GOAL #4  ? Title Gait pattern to have significantly improved to include WNL step/stride lengths, good heel toe mechanics and foot clearance, and only occasional shuffling pattern with and without LRAD   ? Status Achieved   ? ?  ?  ? ?  ? ? ? ? PT Long Term Goals - 04/07/21 0935   ? ?  ?  PT LONG TERM GOAL #1  ? Title MMT to improve by at least 1 grade in all weak groups   ? Status Achieved   ?  ? PT LONG TERM GOAL #2  ? Title Berg balance score to improve to at least 45 to show reduced fall risk/improved functional balance skills   ? Status Achieved   ?  ? PT LONG TERM GOAL #3  ? Title Will be able to ascend and descend at least 4 steps with no UE support and step over step pattern with no more than min guard assist to improve community access   ? Status Partially Met   ? ?  ?  ? ?  ? ? ? ? ? ? ? ? Plan - 04/14/21 0925   ? ? Clinical Impression Statement Pt enters clinic feeling well. He continues to have a difficult time when required to step on to a noncompliant surface. Pt demos good stability with performing sit to stands on airex. Increase resistance tolerated on leg press without issue. Cue for full TKE needed with leg extensions.   ? Personal Factors and Comorbidities Age;Fitness;Behavior Pattern;Social Background;Time since onset of injury/illness/exacerbation   ? Examination-Activity Limitations Locomotion Level;Reach Overhead;Bend;Squat;Stairs;Stand   ? Examination-Participation Restrictions Church;Community Activity;Interpersonal Relationship;Shop;Yard Work;Volunteer   ?  Stability/Clinical Decision Making Stable/Uncomplicated   ? Rehab Potential Good   ? PT Frequency 2x / week   ? PT Duration 8 weeks   ? PT Treatment/Interventions ADLs/Self Care Home Management;Cryotherapy;Electrical Stimulation;Iontophoresis 67m/ml Dexamethasone;Moist Heat;Ultrasound;DME Instruction;Gait training;Stair training;Functional mobility training;Therapeutic activities;Therapeutic exercise;Balance training;Neuromuscular re-education;Cognitive remediation;Patient/family education;Manual techniques;Energy conservation   ? PT Next Visit Plan focus on strength and balance, continue gait training with LRAD   ? ?  ?  ? ?  ? ? ?Patient will benefit from skilled therapeutic intervention in order to improve the following deficits and impairments:  Abnormal gait, Difficulty walking, Decreased endurance, Decreased safety awareness, Decreased activity tolerance, Decreased balance, Decreased knowledge of use of DME, Impaired flexibility, Decreased mobility, Decreased strength, Postural dysfunction ? ?Visit Diagnosis: ?Difficulty in walking, not elsewhere classified ? ?Muscle weakness (generalized) ? ?Repeated falls ? ?Unsteadiness on feet ? ? ? ? ?Problem List ?Patient Active Problem List  ? Diagnosis Date Noted  ? Memory change 04/24/2020  ? Right inguinal hernia 09/05/2017  ? Urinary frequency 09/05/2017  ? Coronary artery disease involving native coronary artery 11/05/2013  ? Imbalance 11/05/2013  ? Routine health maintenance 08/22/2010  ? Hearing loss 08/22/2009  ? ATRIAL FIBRILLATION 06/27/2008  ? Hyperlipidemia 12/09/2006  ? Essential hypertension 12/09/2006  ? MYOCARDIAL INFARCTION, HX OF 12/09/2006  ? CAD (coronary artery disease) 12/09/2006  ? ? ?RScot Jun PTA ?04/14/2021, 9:29 AM ? ?[place] ?[address] ?[place] [address]?Phone: [phone]  Fax:  [phone]? ?Name: Larry Lopez ?MRN: [id number]?Date of Birth: 712/13/1928? ? ? ?

## 2021-04-21 ENCOUNTER — Encounter: Payer: Self-pay | Admitting: Physical Therapy

## 2021-04-21 ENCOUNTER — Ambulatory Visit: Payer: Medicare Other | Admitting: Physical Therapy

## 2021-04-21 DIAGNOSIS — R296 Repeated falls: Secondary | ICD-10-CM | POA: Diagnosis not present

## 2021-04-21 DIAGNOSIS — M6281 Muscle weakness (generalized): Secondary | ICD-10-CM | POA: Diagnosis not present

## 2021-04-21 DIAGNOSIS — R262 Difficulty in walking, not elsewhere classified: Secondary | ICD-10-CM | POA: Diagnosis not present

## 2021-04-21 DIAGNOSIS — R2681 Unsteadiness on feet: Secondary | ICD-10-CM

## 2021-04-21 NOTE — Therapy (Signed)
[name] ?[place] [address] ?[place], [place], [address] ?Phone: [phone]   Fax:  [phone] ? ?Physical Therapy Treatment ? ?Patient Details  ?Name: Larry Lopez ?MRN: [id number] ?Date of Birth: 03-Jan-1927 ?Referring Provider (PT): Pricilla Holm ? ? ?Encounter Date: 04/21/2021 ? ? PT End of Session - 04/21/21 1008   ? ? Visit Number 16   ? Date for PT Re-Evaluation 05/11/21   ? Authorization Type MCR and BCBS   ? PT Start Time [phone]   ? PT Stop Time 1013   ? PT Time Calculation (min) 45 min   ? Activity Tolerance Patient tolerated treatment well   ? Behavior During Therapy [facility] for tasks assessed/performed   ? ?  ?  ? ?  ? ? ?Past Medical History:  ?Diagnosis Date  ? Atrial fibrillation ([place])   ? Bradycardia   ? CAD (coronary artery disease)   ? Glaucoma   ? HLD (hyperlipidemia)   ? HTN (hypertension)   ? Tachy-brady syndrome ([place])   ? ? ?Past Surgical History:  ?Procedure Laterality Date  ? CARDIAC SURGERY    ? CARDIOVERSION N/A 02/06/2013  ? Procedure: CARDIOVERSION;  Surgeon: Jolaine Artist, MD;  Location: [facility] ENDOSCOPY;  Service: Cardiovascular;  Laterality: N/A;  ? CORONARY ARTERY BYPASS GRAFT    ? TONSILLECTOMY AND ADENOIDECTOMY    ? ? ?There were no vitals filed for this visit. ? ? Subjective Assessment - 04/21/21 0930   ? ? Subjective "Good"   ? Currently in Pain? No/denies   ? ?  ?  ? ?  ? ? ? ? ? ? ? ? ? ? ? ? ? ? ? ? ? ? ? ? [place] Adult PT Treatment/Exercise - 04/21/21 0001   ? ?  ? High Level Balance  ? High Level Balance Activities Negotiating over obstacles   forward & side steps  ?  ? Lumbar Exercises: Aerobic  ? Recumbent Bike L4 x6 min   ?  ? Lumbar Exercises: Machines for Strengthening  ? Leg Press 40lb 3x10   ?  ? Lumbar Exercises: Standing  ? Shoulder Extension Power Tower;Both;20 reps;Strengthening   ? Shoulder Extension Limitations 10   ?  ? Knee/Hip Exercises: Standing  ? Heel Raises Both;3 sets;10 reps;2 seconds   ? Walking with  Sports Cord 30lb 4 way x 3 each   ? ?  ?  ? ?  ? ? ? ? ? ? ? ? ? ? ? ? PT Short Term Goals - 03/19/21 0928   ? ?  ? PT SHORT TERM GOAL #1  ? Title Will be compliant with appropriate HEP to be progressed PRN   ? Status Achieved   ?  ? PT SHORT TERM GOAL #2  ? Title Will be able to name 3 ways to reduce fall risk at home and in community   ? Status Achieved   ?  ? PT SHORT TERM GOAL #3  ? Title Will be able to maintain tandem stance for at least 15 seconds and single leg stance for at least 5 seconds B to show improved functional balance   ? Status On-going   ?  ? PT SHORT TERM GOAL #4  ? Title Gait pattern to have significantly improved to include WNL step/stride lengths, good heel toe mechanics and foot clearance, and only occasional shuffling pattern with and without LRAD   ? Status Achieved   ? ?  ?  ? ?  ? ? ? ?  PT Long Term Goals - 04/07/21 0935   ? ?  ? PT LONG TERM GOAL #1  ? Title MMT to improve by at least 1 grade in all weak groups   ? Status Achieved   ?  ? PT LONG TERM GOAL #2  ? Title Berg balance score to improve to at least 45 to show reduced fall risk/improved functional balance skills   ? Status Achieved   ?  ? PT LONG TERM GOAL #3  ? Title Will be able to ascend and descend at least 4 steps with no UE support and step over step pattern with no more than min guard assist to improve community access   ? Status Partially Met   ? ?  ?  ? ?  ? ? ? ? ? ? ? ? Plan - 04/21/21 1011   ? ? Clinical Impression Statement Overall pt did well during today session. Cue needed to control eccentric phase of resisted gait. Cue needed to increase step length with negotiating over obstacles. CGA and verbal cues for sequencing needed with step ups. Pt ha sone more visit scheduled.   ? Personal Factors and Comorbidities Age;Fitness;Behavior Pattern;Social Background;Time since onset of injury/illness/exacerbation   ? Examination-Activity Limitations Locomotion Level;Reach Overhead;Bend;Squat;Stairs;Stand   ?  Examination-Participation Restrictions Church;Community Activity;Interpersonal Relationship;Shop;Yard Work;Volunteer   ? Stability/Clinical Decision Making Stable/Uncomplicated   ? Rehab Potential Good   ? PT Frequency 2x / week   ? PT Treatment/Interventions ADLs/Self Care Home Management;Cryotherapy;Electrical Stimulation;Iontophoresis 73m/ml Dexamethasone;Moist Heat;Ultrasound;DME Instruction;Gait training;Stair training;Functional mobility training;Therapeutic activities;Therapeutic exercise;Balance training;Neuromuscular re-education;Cognitive remediation;Patient/family education;Manual techniques;Energy conservation   ? PT Next Visit Plan D/C next visit   ? ?  ?  ? ?  ? ? ?Patient will benefit from skilled therapeutic intervention in order to improve the following deficits and impairments:  Abnormal gait, Difficulty walking, Decreased endurance, Decreased safety awareness, Decreased activity tolerance, Decreased balance, Decreased knowledge of use of DME, Impaired flexibility, Decreased mobility, Decreased strength, Postural dysfunction ? ?Visit Diagnosis: ?Difficulty in walking, not elsewhere classified ? ?Repeated falls ? ?Unsteadiness on feet ? ?Muscle weakness (generalized) ? ? ? ? ?Problem List ?Patient Active Problem List  ? Diagnosis Date Noted  ? Memory change 04/24/2020  ? Right inguinal hernia 09/05/2017  ? Urinary frequency 09/05/2017  ? Coronary artery disease involving native coronary artery 11/05/2013  ? Imbalance 11/05/2013  ? Routine health maintenance 08/22/2010  ? Hearing loss 08/22/2009  ? ATRIAL FIBRILLATION 06/27/2008  ? Hyperlipidemia 12/09/2006  ? Essential hypertension 12/09/2006  ? MYOCARDIAL INFARCTION, HX OF 12/09/2006  ? CAD (coronary artery disease) 12/09/2006  ? ? ?RScot Jun PTA ?04/21/2021, 10:14 AM ? ?[place] [address] ?[place] [address]?Phone: [phone]  Fax:  [phone]? ?Name: RKurt Hoffmeier [place] ?MRN: [id number]?Date of Birth: 7September 09, 1928? ? ? ?

## 2021-04-23 ENCOUNTER — Ambulatory Visit: Payer: Medicare Other | Admitting: Physical Therapy

## 2021-04-23 ENCOUNTER — Encounter: Payer: Self-pay | Admitting: Physical Therapy

## 2021-04-23 DIAGNOSIS — R2681 Unsteadiness on feet: Secondary | ICD-10-CM

## 2021-04-23 DIAGNOSIS — R262 Difficulty in walking, not elsewhere classified: Secondary | ICD-10-CM

## 2021-04-23 DIAGNOSIS — M6281 Muscle weakness (generalized): Secondary | ICD-10-CM | POA: Diagnosis not present

## 2021-04-23 DIAGNOSIS — R296 Repeated falls: Secondary | ICD-10-CM

## 2021-04-23 NOTE — Therapy (Signed)
[facility] ?[place] [address] ?[place], [place], [address] ?Phone: [phone]   Fax:  [phone] ? ?Physical Therapy Treatment ? ?Patient Details  ?Name: Larry Lopez ?MRN: [id number] ?Date of Birth: 1926-09-07 ?Referring Provider (PT): Pricilla Holm ? ? ?Encounter Date: 04/23/2021 ? ? PT End of Session - 04/23/21 1011   ? ? Visit Number 17   ? Date for PT Re-Evaluation 05/11/21   ? PT Start Time 0930   ? PT Stop Time 1011   ? PT Time Calculation (min) 41 min   ? Activity Tolerance Patient tolerated treatment well   ? Behavior During Therapy [facility] for tasks assessed/performed   ? ?  ?  ? ?  ? ? ?Past Medical History:  ?Diagnosis Date  ? Atrial fibrillation ([place])   ? Bradycardia   ? CAD (coronary artery disease)   ? Glaucoma   ? HLD (hyperlipidemia)   ? HTN (hypertension)   ? Tachy-brady syndrome ([place])   ? ? ?Past Surgical History:  ?Procedure Laterality Date  ? CARDIAC SURGERY    ? CARDIOVERSION N/A 02/06/2013  ? Procedure: CARDIOVERSION;  Surgeon: Jolaine Artist, MD;  Location: [facility] ENDOSCOPY;  Service: Cardiovascular;  Laterality: N/A;  ? CORONARY ARTERY BYPASS GRAFT    ? TONSILLECTOMY AND ADENOIDECTOMY    ? ? ?There were no vitals filed for this visit. ? ? Subjective Assessment - 04/23/21 0936   ? ? Subjective "I feel fine "   ? Currently in Pain? No/denies   ? ?  ?  ? ?  ? ? ? ? ? ? ? ? ? ? ? ? ? ? ? ? ? ? ? ? [place] Adult PT Treatment/Exercise - 04/23/21 0001   ? ?  ? High Level Balance  ? High Level Balance Comments side steps over dowel 2x10 each   ?  ? Lumbar Exercises: Aerobic  ? Nustep L5 x 6 min   ?  ? Lumbar Exercises: Machines for Strengthening  ? Cybex Knee Extension 15lb 2x15   ? Cybex Knee Flexion 35lb 2x15   ?  ? Lumbar Exercises: Standing  ? Shoulder Extension Power Tower;Both;20 reps;Strengthening   ? Shoulder Extension Limitations 10   ?  ? Lumbar Exercises: Seated  ? Sit to Stand 10 reps   x2 LE on airex  ?  ? Knee/Hip Exercises: Standing  ?  Other Standing Knee Exercises step ups on airex 2x5 each   ? Other Standing Knee Exercises Alt 6in box taps   ? ?  ?  ? ?  ? ? ? ? ? ? ? ? ? ? ? ? PT Short Term Goals - 03/19/21 0928   ? ?  ? PT SHORT TERM GOAL #1  ? Title Will be compliant with appropriate HEP to be progressed PRN   ? Status Achieved   ?  ? PT SHORT TERM GOAL #2  ? Title Will be able to name 3 ways to reduce fall risk at home and in community   ? Status Achieved   ?  ? PT SHORT TERM GOAL #3  ? Title Will be able to maintain tandem stance for at least 15 seconds and single leg stance for at least 5 seconds B to show improved functional balance   ? Status On-going   ?  ? PT SHORT TERM GOAL #4  ? Title Gait pattern to have significantly improved to include WNL step/stride lengths, good heel toe mechanics and foot  clearance, and only occasional shuffling pattern with and without LRAD   ? Status Achieved   ? ?  ?  ? ?  ? ? ? ? PT Long Term Goals - 04/23/21 0937   ? ?  ? PT LONG TERM GOAL #3  ? Title Will be able to ascend and descend at least 4 steps with no UE support and step over step pattern with no more than min guard assist to improve community access   ? Status Partially Met   ?  ? PT LONG TERM GOAL #4  ? Title Will be able to ambulate at least 570f on indoor and outdoor surfaces with no more than supervision assist and no significant LOB   ? Status Partially Met   ? ?  ?  ? ?  ? ? ? ? ? ? ? ? Plan - 04/23/21 1011   ? ? Clinical Impression Statement Pt tolerated today's session well evident by no subjective reports of increase pain. Pt has progress completing most goals. He reports no functional limitation and is pleased with his current functional status. Cue to get closer to box with alt taps to prevent over reaching. Some fatigue with sit to stands. Pt has a pool at his house and will continues to be active at home.   ? Personal Factors and Comorbidities Age;Fitness;Behavior Pattern;Social Background;Time since onset of  injury/illness/exacerbation   ? Examination-Activity Limitations Locomotion Level;Reach Overhead;Bend;Squat;Stairs;Stand   ? Examination-Participation Restrictions Church;Community Activity;Interpersonal Relationship;Shop;Yard Work;Volunteer   ? Stability/Clinical Decision Making Stable/Uncomplicated   ? Rehab Potential Good   ? PT Frequency 2x / week   ? PT Treatment/Interventions ADLs/Self Care Home Management;Cryotherapy;Electrical Stimulation;Iontophoresis 428mml Dexamethasone;Moist Heat;Ultrasound;DME Instruction;Gait training;Stair training;Functional mobility training;Therapeutic activities;Therapeutic exercise;Balance training;Neuromuscular re-education;Cognitive remediation;Patient/family education;Manual techniques;Energy conservation   ? PT Next Visit Plan D/C   ? ?  ?  ? ?  ? ? ?Patient will benefit from skilled therapeutic intervention in order to improve the following deficits and impairments:  Abnormal gait, Difficulty walking, Decreased endurance, Decreased safety awareness, Decreased activity tolerance, Decreased balance, Decreased knowledge of use of DME, Impaired flexibility, Decreased mobility, Decreased strength, Postural dysfunction ? ?Visit Diagnosis: ?Difficulty in walking, not elsewhere classified ? ?Unsteadiness on feet ? ?Repeated falls ? ?Muscle weakness (generalized) ? ? ? ? ?Problem List ?Patient Active Problem List  ? Diagnosis Date Noted  ? Memory change 04/24/2020  ? Right inguinal hernia 09/05/2017  ? Urinary frequency 09/05/2017  ? Coronary artery disease involving native coronary artery 11/05/2013  ? Imbalance 11/05/2013  ? Routine health maintenance 08/22/2010  ? Hearing loss 08/22/2009  ? ATRIAL FIBRILLATION 06/27/2008  ? Hyperlipidemia 12/09/2006  ? Essential hypertension 12/09/2006  ? MYOCARDIAL INFARCTION, HX OF 12/09/2006  ? CAD (coronary artery disease) 12/09/2006  ? ?PHYSICAL THERAPY DISCHARGE SUMMARY ? ?Visits from Start of Care: 17 ? ? ?Patient agrees to discharge.  Patient goals were met. Patient is being discharged due to being pleased with the current functional level. ? ? ?[place]ot JunPTA ?04/23/2021, 10:14 AM ? ?[place] ?[address]?[place]Phone: [phone] Fax:  [phone] ?Name: RaBashir Marchettiurcot ?MRN: [id number] of Birth: 7/Jun 06, 1926 ? ? ?

## 2021-06-18 DIAGNOSIS — H9212 Otorrhea, left ear: Secondary | ICD-10-CM | POA: Diagnosis not present

## 2021-06-23 ENCOUNTER — Telehealth: Payer: Medicare Other

## 2021-06-25 DIAGNOSIS — L98499 Non-pressure chronic ulcer of skin of other sites with unspecified severity: Secondary | ICD-10-CM | POA: Diagnosis not present

## 2021-06-25 DIAGNOSIS — H9212 Otorrhea, left ear: Secondary | ICD-10-CM | POA: Diagnosis not present

## 2021-07-09 DIAGNOSIS — H9212 Otorrhea, left ear: Secondary | ICD-10-CM | POA: Diagnosis not present

## 2021-07-09 DIAGNOSIS — H9193 Unspecified hearing loss, bilateral: Secondary | ICD-10-CM | POA: Diagnosis not present

## 2021-07-26 ENCOUNTER — Other Ambulatory Visit (HOSPITAL_COMMUNITY): Payer: Self-pay | Admitting: Internal Medicine

## 2021-08-19 ENCOUNTER — Other Ambulatory Visit (HOSPITAL_COMMUNITY): Payer: Self-pay | Admitting: Internal Medicine

## 2021-08-19 DIAGNOSIS — H524 Presbyopia: Secondary | ICD-10-CM | POA: Diagnosis not present

## 2021-08-19 DIAGNOSIS — H0100A Unspecified blepharitis right eye, upper and lower eyelids: Secondary | ICD-10-CM | POA: Diagnosis not present

## 2021-08-19 DIAGNOSIS — H401133 Primary open-angle glaucoma, bilateral, severe stage: Secondary | ICD-10-CM | POA: Diagnosis not present

## 2021-08-19 DIAGNOSIS — D3131 Benign neoplasm of right choroid: Secondary | ICD-10-CM | POA: Diagnosis not present

## 2021-09-03 ENCOUNTER — Ambulatory Visit (INDEPENDENT_AMBULATORY_CARE_PROVIDER_SITE_OTHER): Payer: Medicare Other | Admitting: Internal Medicine

## 2021-09-03 ENCOUNTER — Encounter: Payer: Self-pay | Admitting: Internal Medicine

## 2021-09-03 DIAGNOSIS — R413 Other amnesia: Secondary | ICD-10-CM

## 2021-09-03 MED ORDER — DONEPEZIL HCL 10 MG PO TABS: 10.0000 mg | ORAL_TABLET | Freq: Every day | ORAL | 3 refills | Status: DC

## 2021-09-03 NOTE — Progress Notes (Signed)
   Subjective:   Patient ID: Larry Lopez, male    DOB: July 05, 1926, 88 y.o.   MRN: [id number]  HPI The patient is a 88 YO man coming in for follow up memory. Tolerating aricept 5 mg well. Some changes including putting away dishes in weird places. Does not drive. Does not handle finances any longer.   Review of Systems  Constitutional: Negative.   HENT: Negative.    Eyes: Negative.   Respiratory:  Negative for cough, chest tightness and shortness of breath.   Cardiovascular:  Negative for chest pain, palpitations and leg swelling.  Gastrointestinal:  Negative for abdominal distention, abdominal pain, constipation, diarrhea, nausea and vomiting.  Musculoskeletal: Negative.   Skin: Negative.   Neurological: Negative.        Memory change.  Psychiatric/Behavioral: Negative.      Objective:  Physical Exam Constitutional:      Appearance: He is well-developed.  HENT:     Head: Normocephalic and atraumatic.  Cardiovascular:     Rate and Rhythm: Normal rate and regular rhythm.  Pulmonary:     Effort: Pulmonary effort is normal. No respiratory distress.     Breath sounds: Normal breath sounds. No wheezing or rales.  Abdominal:     General: Bowel sounds are normal. There is no distension.     Palpations: Abdomen is soft.     Tenderness: There is no abdominal tenderness. There is no rebound.  Musculoskeletal:     Cervical back: Normal range of motion.  Skin:    General: Skin is warm and dry.  Neurological:     Mental Status: He is alert.     Coordination: Coordination normal.     Comments: A and O times 2 not to date     Vitals:   09/03/21 0826 09/03/21 0837  BP: (!) 140/82 (!) 140/82  Pulse: (!) 57   Temp: 97.8 F (36.6 C)   TempSrc: Oral   SpO2: 97%   Weight: 159 lb 2 oz (72.2 kg)   Height: '5\' 11"'$  (1.803 m)     Assessment & Plan:

## 2021-09-03 NOTE — Patient Instructions (Signed)
We will increase aricept (donepizil) to 10 mg daily

## 2021-09-04 NOTE — Assessment & Plan Note (Signed)
Increase aricept to 10 mg daily. Does not drive or handle finances any longer. We discussed caregiver stress and being left unattended. Routines are good for memory.

## 2021-09-16 DIAGNOSIS — H903 Sensorineural hearing loss, bilateral: Secondary | ICD-10-CM | POA: Diagnosis not present

## 2021-09-16 DIAGNOSIS — Z23 Encounter for immunization: Secondary | ICD-10-CM | POA: Diagnosis not present

## 2021-10-15 DIAGNOSIS — C44529 Squamous cell carcinoma of skin of other part of trunk: Secondary | ICD-10-CM | POA: Diagnosis not present

## 2021-10-15 DIAGNOSIS — D485 Neoplasm of uncertain behavior of skin: Secondary | ICD-10-CM | POA: Diagnosis not present

## 2021-10-15 DIAGNOSIS — Z85828 Personal history of other malignant neoplasm of skin: Secondary | ICD-10-CM | POA: Diagnosis not present

## 2021-11-18 DIAGNOSIS — Z23 Encounter for immunization: Secondary | ICD-10-CM | POA: Diagnosis not present

## 2021-11-18 NOTE — Progress Notes (Incomplete)
CARDIOLOGY CLINIC NOTE  Patient ID: Larry Lopez, male   DOB: 1926/04/07, 88 y.o.   MRN: [id number]  HPI:  Larry Lopez ('[name]") is a 88 y.o. male  with a history of a coronary artery disease status post redo bypass surgery in 2002 by Dr. Cyndia Bent. He also has a history of hypertension and hyperlipidemia, PAF complicated by  tachy-brady syndrome with significant resting bradycardia prohibiting AV-Nodal blockade. Was hospitalized in 6/10 with rapid AF.  He saw Dr. Caryl Comes previously for consideration of pacemaker but it was thought that as AF is infrequent and self-limiting and his bradycardia is asymptomatic  that we would watch it for now and avoid AV-nodal blockers due to bradycardia. If AF burden increased would then consider pacing and re-insitutuion of AV-nodal blockers.  On 02/06/13 underwent DC-CV for recurrent AF but quickly reverted back to AF.   He had Lexiscan Myoview 08/08/13 EF 56% Echo read as EF 40-45% (Dr. Haroldine Laws reviewed personally and thought EF closer to 50-55%.  Had fall in 7/18 with splenic laceration Eliquis stopped for several weeks.   Here for routine f/u. Patient is accompanied by his wife. Has been doing well from cardiac standpoint. No chest pain, palpitations or dyspnea. Denies orthopnea, PND or LE edema. He is sedentary most of day but is able to walk about 100 yrds to the mailbox and back without difficulty. Taking meds as prescribed. No falls or bleeding issues with Eliquis.  Drinks about 1/2 bottle of wine a day while watching the news.  Lab Results  Component Value Date   CHOL 199 02/03/2021   HDL 53.10 02/03/2021   LDLCALC 116 (H) 02/03/2021   TRIG 151.0 (H) 02/03/2021   CHOLHDL 4 02/03/2021    ROS: All systems negative except as listed in HPI, PMH and Problem List.  Past Medical History:  Diagnosis Date   Atrial fibrillation (H[facility])    Bradycardia    CAD (coronary artery disease)    Glaucoma    HLD (hyperlipidemia)    HTN (hypertension)     Tachy-brady syndrome (H[facility])     Current Outpatient Medications  Medication Sig Dispense Refill   atorvastatin (LIPITOR) 40 MG tablet Take 1 tablet by mouth once daily 90 tablet 0   B Complex-C (SUPER B COMPLEX PO) Take 1 tablet by mouth daily.     calcium carbonate (OSCAL) 1500 (600 Ca) MG TABS tablet Take 1 tablet by mouth daily.     donepezil (ARICEPT) 10 MG tablet Take 1 tablet (10 mg total) by mouth at bedtime. 90 tablet 3   ELIQUIS 2.5 MG TABS tablet Take 1 tablet by mouth twice daily 180 tablet 3   Emollient (CERAVE) CREA Apply topically. Applied to scalp as needed     Ginger, Zingiber officinalis, (GINGER PO) See admin instructions. Chew a small portion of ginger root as needed for indigestion     hydrochlorothiazide (HYDRODIURIL) 25 MG tablet Take 1 tablet by mouth once daily 90 tablet 3   latanoprost (XALATAN) 0.005 % ophthalmic solution      Multiple Vitamin (MULTI-VITAMIN) tablet Take by mouth.     tamsulosin (FLOMAX) 0.4 MG CAPS capsule Take 1 capsule (0.4 mg total) by mouth daily. 90 capsule 3   timolol (TIMOPTIC) 0.5 % ophthalmic solution INSTILL 1 DROP INTO EACH EYE TWICE DAILY     triamcinolone cream (KENALOG) 0.1 % APPLY  CREAM TOPICALLY TO AFFECTED AREA TWICE DAILY 10 g 0   metoprolol tartrate (LOPRESSOR) 25 MG tablet TAKE 1  TABLET BY MOUTH ONCE DAILY AS NEEDED. ONLY  USES  IF  NEEDED  FOR  TACHYCARDIA.  HAS  NOT  NEEDED  IN  OVER  A  YEAR (Patient not taking: Reported on 11/19/2021) 90 tablet 0   nitroGLYCERIN (NITROSTAT) 0.4 MG SL tablet Place 1 tablet (0.4 mg total) under the tongue every 5 (five) minutes as needed for chest pain. (Patient not taking: Reported on 11/19/2021) 25 tablet 6   No current facility-administered medications for this encounter.    PHYSICAL EXAM: Vitals:   11/19/21 0936  BP: 130/76  Pulse: 77  SpO2: 98%  Weight: 71.4 kg (157 lb 6.4 oz)    Wt Readings from Last 3 Encounters:  11/19/21 71.4 kg (157 lb 6.4 oz)  09/03/21 72.2 kg (159 lb 2 oz)   02/03/21 72.1 kg (159 lb)    General:  Well appearing elderly male. Ambulated into clinic.  HEENT: normal Neck: supple. no JVD. Carotids 2+ bilat; no bruits.  Cor: PMI nondisplaced. Irregular rate & rhythm. No rubs, gallops or murmurs. Lungs: clear Abdomen: soft, nontender, nondistended.  Extremities: no cyanosis, clubbing, rash, edema Neuro: alert & orientedx3, cranial nerves grossly intact. moves all 4 extremities w/o difficulty. Affect pleasant   ASSESSMENT & PLAN:  1. Chronic AF - rate controlled. Well tolerated.  - No bleeding issues on Eliquis 2.5 BID (has been on lower dose d/t age and relatively low body weight). - CBC today  2. HTN  - Blood pressure well controlled.  - Continue current regimen. - BMET today  3. H/o tachy brady syndrome stable - no indication for PPM   4. CAD s/p re-do CABG 2002 - no s/s ischemia - continue statin - off ASA with Eliquis - encouraged activity as tolerated.  - Recommended he try to cut back ETOH use.  5. Hyperlipidemia  - followed by PCP - Goal LDL < 70. Continue statin   6. Neuropathy - Followed by Neurology  - No change  Follow-up: 1 year with Dr. Haroldine Laws  Myriam Brandhorst, Lynder Parents, PA-C 9:57 AM

## 2021-11-19 ENCOUNTER — Encounter (HOSPITAL_COMMUNITY): Payer: Self-pay

## 2021-11-19 ENCOUNTER — Ambulatory Visit (HOSPITAL_COMMUNITY)
Admission: RE | Admit: 2021-11-19 | Discharge: 2021-11-19 | Disposition: A | Discharge status: Home / Self Care | Payer: Medicare Other | Source: Ambulatory Visit | Attending: Physician Assistant | Admitting: Physician Assistant

## 2021-11-19 VITALS — BP 130/76 | HR 77 | Wt 157.4 lb

## 2021-11-19 DIAGNOSIS — E782 Mixed hyperlipidemia: Secondary | ICD-10-CM

## 2021-11-19 DIAGNOSIS — G629 Polyneuropathy, unspecified: Secondary | ICD-10-CM | POA: Diagnosis not present

## 2021-11-19 DIAGNOSIS — E785 Hyperlipidemia, unspecified: Secondary | ICD-10-CM | POA: Diagnosis not present

## 2021-11-19 DIAGNOSIS — I482 Chronic atrial fibrillation, unspecified: Secondary | ICD-10-CM | POA: Insufficient documentation

## 2021-11-19 DIAGNOSIS — Z7901 Long term (current) use of anticoagulants: Secondary | ICD-10-CM | POA: Diagnosis not present

## 2021-11-19 DIAGNOSIS — Z951 Presence of aortocoronary bypass graft: Secondary | ICD-10-CM | POA: Insufficient documentation

## 2021-11-19 DIAGNOSIS — I2510 Atherosclerotic heart disease of native coronary artery without angina pectoris: Secondary | ICD-10-CM | POA: Diagnosis not present

## 2021-11-19 DIAGNOSIS — I480 Paroxysmal atrial fibrillation: Secondary | ICD-10-CM | POA: Insufficient documentation

## 2021-11-19 DIAGNOSIS — I10 Essential (primary) hypertension: Secondary | ICD-10-CM | POA: Insufficient documentation

## 2021-11-19 LAB — BASIC METABOLIC PANEL
Anion gap: 7 (ref 5–15)
BUN: 9 mg/dL (ref 8–23)
CO2: 29 mmol/L (ref 22–32)
Calcium: 9 mg/dL (ref 8.9–10.3)
Chloride: 106 mmol/L (ref 98–111)
Creatinine, Ser: 1.02 mg/dL (ref 0.61–1.24)
GFR, Estimated: >60 mL/min (ref >60)
Glucose, Bld: 91 mg/dL (ref 70–99)
Potassium: 4 mmol/L (ref 3.5–5.1)
Sodium: 142 mmol/L (ref 135–145)

## 2021-11-19 LAB — CBC
HCT: 43.6 % (ref 39.0–52.0)
Hemoglobin: 14.5 g/dL (ref 13.0–17.0)
MCH: 31.4 pg (ref 26.0–34.0)
MCHC: 33.3 g/dL (ref 30.0–36.0)
MCV: 94.4 fL (ref 80.0–100.0)
Platelets: 171 10*3/uL (ref 150–400)
RBC: 4.62 MIL/uL (ref 4.22–5.81)
RDW: 14.6 % (ref 11.5–15.5)
WBC: 6.1 10*3/uL (ref 4.0–10.5)
nRBC: 0 % (ref 0.0–0.2)

## 2021-11-19 NOTE — Patient Instructions (Addendum)
Thank you for coming in today  Labs were done today, if any labs are abnormal the clinic will call you No news is good news  Your physician recommends that you schedule a follow-up appointment in:  1 year with Dr. Haroldine Laws    Do the following things EVERYDAY: Weigh yourself in the morning before breakfast. Write it down and keep it in a log. Take your medicines as prescribed Eat low salt foods--Limit salt (sodium) to 2000 mg per day.  Stay as active as you can everyday Limit all fluids for the day to less than 2 liters  At the [facility], you and your health needs are our priority. As part of our continuing mission to provide you with exceptional heart care, we have created designated Provider Care Teams. These Care Teams include your primary Cardiologist (physician) and Advanced Practice Providers (APPs- Physician Assistants and Nurse Practitioners) who all work together to provide you with the care you need, when you need it.   You may see any of the following providers on your designated Care Team at your next follow up: Dr Glori Bickers Dr Loralie Champagne Dr. Roxana Hires, NP Lyda Jester, [place] [facility] [place], [place] Forestine Na, NP Audry Riles, PharmD   Please be sure to bring in all your medications bottles to every appointment.   If you have any questions or concerns before your next appointment please send [name] a message through [place] or call our office at [phone].    TO LEAVE A MESSAGE FOR THE NURSE SELECT OPTION 2, PLEASE LEAVE A MESSAGE INCLUDING: YOUR NAME DATE OF BIRTH CALL BACK NUMBER REASON FOR CALL**this is important as we prioritize the call backs  YOU WILL RECEIVE A CALL BACK THE SAME DAY AS LONG AS YOU CALL BEFORE 4:00 PM

## 2021-12-21 ENCOUNTER — Other Ambulatory Visit (HOSPITAL_COMMUNITY): Payer: Self-pay | Admitting: Internal Medicine

## 2022-01-05 ENCOUNTER — Other Ambulatory Visit (HOSPITAL_COMMUNITY): Payer: Self-pay | Admitting: Internal Medicine

## 2022-01-18 DIAGNOSIS — L570 Actinic keratosis: Secondary | ICD-10-CM | POA: Diagnosis not present

## 2022-01-18 DIAGNOSIS — L821 Other seborrheic keratosis: Secondary | ICD-10-CM | POA: Diagnosis not present

## 2022-01-18 DIAGNOSIS — Z85828 Personal history of other malignant neoplasm of skin: Secondary | ICD-10-CM | POA: Diagnosis not present

## 2022-01-22 ENCOUNTER — Telehealth: Payer: Self-pay | Admitting: Internal Medicine

## 2022-01-22 NOTE — Telephone Encounter (Signed)
Left message for patient to call back to schedule Medicare Annual Wellness Visit   Last AWV  02/03/21  Please schedule at anytime with [facility] if patient calls the office back.    30 Minutes appointment   Any questions, please call me at [phone]

## 2022-01-29 ENCOUNTER — Inpatient Hospital Stay (HOSPITAL_COMMUNITY)
Admission: EM | Admit: 2022-01-29 | Discharge: 2022-02-01 | DRG: 193 | Disposition: A | Discharge status: Home Health | Payer: Medicare Other | Attending: Internal Medicine | Admitting: Internal Medicine

## 2022-01-29 ENCOUNTER — Other Ambulatory Visit: Payer: Self-pay

## 2022-01-29 ENCOUNTER — Encounter (HOSPITAL_COMMUNITY): Payer: Self-pay

## 2022-01-29 ENCOUNTER — Emergency Department (HOSPITAL_COMMUNITY): Payer: Medicare Other

## 2022-01-29 DIAGNOSIS — Z803 Family history of malignant neoplasm of breast: Secondary | ICD-10-CM | POA: Diagnosis not present

## 2022-01-29 DIAGNOSIS — N390 Urinary tract infection, site not specified: Secondary | ICD-10-CM

## 2022-01-29 DIAGNOSIS — H401190 Primary open-angle glaucoma, unspecified eye, stage unspecified: Secondary | ICD-10-CM | POA: Diagnosis present

## 2022-01-29 DIAGNOSIS — E782 Mixed hyperlipidemia: Secondary | ICD-10-CM | POA: Diagnosis present

## 2022-01-29 DIAGNOSIS — Z66 Do not resuscitate: Secondary | ICD-10-CM | POA: Diagnosis present

## 2022-01-29 DIAGNOSIS — R531 Weakness: Secondary | ICD-10-CM | POA: Diagnosis not present

## 2022-01-29 DIAGNOSIS — N3000 Acute cystitis without hematuria: Secondary | ICD-10-CM | POA: Diagnosis not present

## 2022-01-29 DIAGNOSIS — I10 Essential (primary) hypertension: Secondary | ICD-10-CM | POA: Diagnosis not present

## 2022-01-29 DIAGNOSIS — Z800 Family history of malignant neoplasm of digestive organs: Secondary | ICD-10-CM

## 2022-01-29 DIAGNOSIS — I4821 Permanent atrial fibrillation: Secondary | ICD-10-CM | POA: Diagnosis present

## 2022-01-29 DIAGNOSIS — Z79899 Other long term (current) drug therapy: Secondary | ICD-10-CM | POA: Diagnosis not present

## 2022-01-29 DIAGNOSIS — J189 Pneumonia, unspecified organism: Secondary | ICD-10-CM | POA: Diagnosis not present

## 2022-01-29 DIAGNOSIS — R0902 Hypoxemia: Secondary | ICD-10-CM | POA: Diagnosis present

## 2022-01-29 DIAGNOSIS — I110 Hypertensive heart disease with heart failure: Secondary | ICD-10-CM | POA: Diagnosis present

## 2022-01-29 DIAGNOSIS — I4891 Unspecified atrial fibrillation: Secondary | ICD-10-CM | POA: Diagnosis not present

## 2022-01-29 DIAGNOSIS — E876 Hypokalemia: Secondary | ICD-10-CM | POA: Diagnosis not present

## 2022-01-29 DIAGNOSIS — I5022 Chronic systolic (congestive) heart failure: Secondary | ICD-10-CM | POA: Diagnosis present

## 2022-01-29 DIAGNOSIS — Z7901 Long term (current) use of anticoagulants: Secondary | ICD-10-CM

## 2022-01-29 DIAGNOSIS — N401 Enlarged prostate with lower urinary tract symptoms: Secondary | ICD-10-CM | POA: Diagnosis present

## 2022-01-29 DIAGNOSIS — G9341 Metabolic encephalopathy: Secondary | ICD-10-CM | POA: Diagnosis present

## 2022-01-29 DIAGNOSIS — R509 Fever, unspecified: Secondary | ICD-10-CM | POA: Diagnosis not present

## 2022-01-29 DIAGNOSIS — Z87891 Personal history of nicotine dependence: Secondary | ICD-10-CM

## 2022-01-29 DIAGNOSIS — I2510 Atherosclerotic heart disease of native coronary artery without angina pectoris: Secondary | ICD-10-CM | POA: Diagnosis present

## 2022-01-29 DIAGNOSIS — Z1152 Encounter for screening for COVID-19: Secondary | ICD-10-CM | POA: Diagnosis not present

## 2022-01-29 DIAGNOSIS — I480 Paroxysmal atrial fibrillation: Secondary | ICD-10-CM | POA: Diagnosis not present

## 2022-01-29 DIAGNOSIS — Z951 Presence of aortocoronary bypass graft: Secondary | ICD-10-CM

## 2022-01-29 DIAGNOSIS — N400 Enlarged prostate without lower urinary tract symptoms: Secondary | ICD-10-CM | POA: Diagnosis not present

## 2022-01-29 DIAGNOSIS — I25810 Atherosclerosis of coronary artery bypass graft(s) without angina pectoris: Secondary | ICD-10-CM | POA: Diagnosis not present

## 2022-01-29 DIAGNOSIS — R0602 Shortness of breath: Secondary | ICD-10-CM | POA: Diagnosis not present

## 2022-01-29 DIAGNOSIS — R6510 Systemic inflammatory response syndrome (SIRS) of non-infectious origin without acute organ dysfunction: Secondary | ICD-10-CM | POA: Insufficient documentation

## 2022-01-29 MED ORDER — SODIUM CHLORIDE 0.9 % IV SOLN: INTRAVENOUS | Status: AC

## 2022-01-29 NOTE — ED Triage Notes (Signed)
Pt arrives to ED via [facility] from home. Per EMS report, pt's wife endorses after going out  to eat today, pt went home and slept all day, which is not his normal. Fever and generalized weakness today.

## 2022-01-29 NOTE — ED Provider Notes (Signed)
[facility] Provider Note  CSN: [id number] Arrival date & time: 01/29/22 2232  Chief Complaint(s) Fever and Weakness  HPI Larry Lopez is a 89 y.o. male with a past medical history listed below including hypertension, hyperlipidemia, A-fib on Eliquis, chronic systolic heart failure with a last EF of 40 to 45% in 2015 who presents to the emergency department for 3 days of cough and 1 day of generalized fatigue.  Patient reports waking up this morning feeling ill but was still able to go out and run errands.  When he and wife came home, he fell asleep around noon and slept for most of the day.  When the wife woke him up this afternoon, she noted that he was very weak and unable to get out of the bed.  They noted he was febrile subjectively.  There is prompted a call to EMS he was given Tylenol and route.  Patient denied any shortness of breath.  No nausea or vomiting.  No abdominal pain.  No urinary symptoms.  The history is provided by the patient and the spouse.  Weakness   Past Medical History Past Medical History:  Diagnosis Date   Atrial fibrillation (HCC)    Bradycardia    CAD (coronary artery disease)    Glaucoma    HLD (hyperlipidemia)    HTN (hypertension)    Tachy-brady syndrome (HCC)    Patient Active Problem List   Diagnosis Date Noted   Memory change 04/24/2020   Bilateral impacted cerumen 11/27/2018   Right inguinal hernia 09/05/2017   Urinary frequency 09/05/2017   Coronary artery disease involving native coronary artery 11/05/2013   Imbalance 11/05/2013   Anatomical narrow angle borderline glaucoma 01/18/2011   Primary open angle glaucoma 01/18/2011   Senile nuclear sclerosis 01/18/2011   Routine health maintenance 08/22/2010   Hearing loss 08/22/2009   ATRIAL FIBRILLATION 06/27/2008   Hyperlipidemia 12/09/2006   Essential hypertension 12/09/2006   MYOCARDIAL INFARCTION, HX OF 12/09/2006   CAD (coronary artery  disease) 12/09/2006   Home Medication(s) Prior to Admission medications   Medication Sig Start Date End Date Taking? Authorizing Provider  atorvastatin (LIPITOR) 40 MG tablet Take 1 tablet by mouth once daily 01/05/22   Bensimhon, Shaune Pascal, MD  B Complex-C (SUPER B COMPLEX PO) Take 1 tablet by mouth daily.    [provider]  calcium carbonate (OSCAL) 1500 (600 Ca) MG TABS tablet Take 1 tablet by mouth daily.    [provider]  donepezil (ARICEPT) 10 MG tablet Take 1 tablet (10 mg total) by mouth at bedtime. 09/03/21   Hoyt Koch, MD  ELIQUIS 2.5 MG TABS tablet Take 1 tablet by mouth twice daily 07/27/21   Bensimhon, Shaune Pascal, MD  Emollient (CERAVE) CREA Apply topically. Applied to scalp as needed    [provider]  Ginger, Zingiber officinalis, (GINGER PO) See admin instructions. Chew a small portion of ginger root as needed for indigestion    [provider]  hydrochlorothiazide (HYDRODIURIL) 25 MG tablet Take 1 tablet by mouth once daily 12/21/21   Bensimhon, Shaune Pascal, MD  latanoprost (XALATAN) 0.005 % ophthalmic solution  06/26/18   [provider]  metoprolol tartrate (LOPRESSOR) 25 MG tablet TAKE 1 TABLET BY MOUTH ONCE DAILY AS NEEDED. ONLY  USES  IF  NEEDED  FOR  TACHYCARDIA.  HAS  NOT  NEEDED  IN  OVER  A  YEAR Patient not taking: Reported on 11/19/2021 11/01/18  Bensimhon, Shaune Pascal, MD  Multiple Vitamin (MULTI-VITAMIN) tablet Take by mouth.    [provider]  nitroGLYCERIN (NITROSTAT) 0.4 MG SL tablet Place 1 tablet (0.4 mg total) under the tongue every 5 (five) minutes as needed for chest pain. Patient not taking: Reported on 11/19/2021 04/18/17   Bensimhon, Shaune Pascal, MD  tamsulosin (FLOMAX) 0.4 MG CAPS capsule Take 1 capsule (0.4 mg total) by mouth daily. 02/03/21   Hoyt Koch, MD  timolol (TIMOPTIC) 0.5 % ophthalmic solution INSTILL 1 DROP INTO EACH EYE TWICE DAILY 06/27/18   [provider]   triamcinolone cream (KENALOG) 0.1 % APPLY  CREAM TOPICALLY TO AFFECTED AREA TWICE DAILY 05/21/20   Hoyt Koch, MD                                                                                                                                    Allergies Patient has no known allergies.  Review of Systems Review of Systems As noted in HPI  Physical Exam Vital Signs  I have reviewed the triage vital signs BP 117/60   Pulse 95   Temp 98.4 F (36.9 C) (Oral)   Resp 18   SpO2 96%   Physical Exam Vitals reviewed.  Constitutional:      General: He is not in acute distress.    Appearance: He is well-developed. He is not diaphoretic.  HENT:     Head: Normocephalic and atraumatic.     Nose: Nose normal.  Eyes:     General: No scleral icterus.       Right eye: No discharge.        Left eye: No discharge.     Conjunctiva/sclera: Conjunctivae normal.     Pupils: Pupils are equal, round, and reactive to light.  Cardiovascular:     Rate and Rhythm: Regular rhythm.     Heart sounds: No murmur heard.    No friction rub. No gallop.  Pulmonary:     Effort: Pulmonary effort is normal. No respiratory distress.     Breath sounds: Normal breath sounds. No stridor. No rales.  Abdominal:     General: There is no distension.     Palpations: Abdomen is soft.     Tenderness: There is no abdominal tenderness.  Musculoskeletal:        General: No tenderness.     Cervical back: Normal range of motion and neck supple.  Skin:    General: Skin is warm and dry.     Findings: No erythema or rash.  Neurological:     Mental Status: He is alert and oriented to person, place, and time.     ED Results and Treatments Labs (all labs ordered are listed, but only abnormal results are displayed) Labs Reviewed  COMPREHENSIVE METABOLIC PANEL - Abnormal; Notable for the following components:      Result Value   Sodium 133 (*)  Potassium 3.3 (*)    Glucose, Bld 146 (*)    Calcium 8.1 (*)     Total Protein 6.2 (*)    Albumin 3.3 (*)    Total Bilirubin 2.2 (*)    All other components within normal limits  CBC WITH DIFFERENTIAL/PLATELET - Abnormal; Notable for the following components:   RBC 3.79 (*)    Hemoglobin 11.8 (*)    HCT 35.4 (*)    Platelets 141 (*)    Lymphs Abs 0.6 (*)    Monocytes Absolute 1.2 (*)    All other components within normal limits  PROTIME-INR - Abnormal; Notable for the following components:   Prothrombin Time 16.4 (*)    INR 1.3 (*)    All other components within normal limits  APTT - Abnormal; Notable for the following components:   aPTT 40 (*)    All other components within normal limits  URINALYSIS, ROUTINE W REFLEX MICROSCOPIC - Abnormal; Notable for the following components:   APPearance HAZY (*)    Hgb urine dipstick SMALL (*)    Ketones, ur 5 (*)    Leukocytes,Ua LARGE (*)    WBC, UA >50 (*)    Bacteria, UA MANY (*)    All other components within normal limits  RESP PANEL BY RT-PCR (RSV, FLU A&B, COVID)  RVPGX2  CULTURE, BLOOD (ROUTINE X 2)  CULTURE, BLOOD (ROUTINE X 2)  LACTIC ACID, PLASMA                                                                                                                         EKG  EKG Interpretation  Date/Time:  Friday January 29 2022 23:34:02 EST Ventricular Rate:  83 PR Interval:    QRS Duration: 87 QT Interval:  384 QTC Calculation: 452 R Axis:   14 Text Interpretation: Atrial fibrillation Low voltage, extremity leads Confirmed by Addison Lank [phone]) on 01/29/2022 11:52:52 PM       Radiology DG Chest Port 1 View  Result Date: 01/30/2022 CLINICAL DATA:  Shortness of breath. EXAM: PORTABLE CHEST 1 VIEW COMPARISON:  01/28/2022. FINDINGS: The heart is enlarged and the mediastinal contour is stable. There is atherosclerotic calcification of the aorta. Mild airspace disease is present at the left lung base and incompletely visualized due to limited field of view. The right lung is clear. No  definite effusion or pneumothorax. IMPRESSION: Mild airspace disease at the left lung base, possible atelectasis or infiltrate. Electronically Signed   By: Brett Fairy M.D.   On: 01/30/2022 04:20   DG Chest Port 1 View  Result Date: 01/29/2022 CLINICAL DATA:  Possible sepsis.  Fever and generalized weakness. EXAM: PORTABLE CHEST 1 VIEW COMPARISON:  08/04/2016. FINDINGS: Heart is enlarged and the mediastinal contour is within normal limits. There is atherosclerotic calcification of the aorta. Mild airspace disease is present at the left lung base. No effusion or pneumothorax. Sternotomy wires are present over the midline. No acute osseous abnormality. IMPRESSION: Mild airspace  disease at the left lung base, possible developing infiltrate. Electronically Signed   By: Brett Fairy M.D.   On: 01/29/2022 23:46    Medications Ordered in ED Medications  0.9 %  sodium chloride infusion ( Intravenous New Bag/Given 01/29/22 2335)  metoprolol tartrate (LOPRESSOR) tablet 25 mg (has no administration in time range)  acetaminophen (TYLENOL) tablet 650 mg (has no administration in time range)  azithromycin (ZITHROMAX) 500 mg in sodium chloride 0.9 % 250 mL IVPB (has no administration in time range)  cefTRIAXone (ROCEPHIN) 1 g in sodium chloride 0.9 % 100 mL IVPB (0 g Intravenous Stopped 01/30/22 0249)                                                                                                                                     Procedures .1-3 Lead EKG Interpretation  Performed by: Fatima Blank, MD Authorized by: Fatima Blank, MD     Interpretation: abnormal     ECG rate:  80-130   ECG rate assessment: tachycardic     Rhythm: atrial fibrillation     Ectopy: none     Conduction: normal     (including critical care time)  Medical Decision Making / ED Course   Medical Decision Making Amount and/or Complexity of Data Reviewed Labs: ordered. Decision-making details documented in  ED Course. Radiology: ordered and independent interpretation performed. Decision-making details documented in ED Course. ECG/medicine tests: ordered and independent interpretation performed. Decision-making details documented in ED Course.  Risk OTC drugs. Prescription drug management. Decision regarding hospitalization.   3 days of cough, generalized fatigue and fever.  Will obtain infectious workup to assess for pneumonia, urinary tract infection, viral process.  Workup: Chest x-ray notable for possible left lower lobe pneumonia. CBC without leukocytosis.  Mild anemia. Metabolic panel with mild hypokalemia, hyponatremia.  Mild hyperglycemia without DKA.  No renal insufficiency. Lactic acid normal ruling out severe sepsis. UA is consistent with a urinary tract infection Respiratory viral panel negative for COVID, influenza and RSV.  After patient defervesced following Tylenol, he felt better.  Will provide patient with first dose of antibiotics here in the emergency department and reassess.  Anticipate DC home if stable.  After antibiotics, on reassessment patient was made to ambulate.  He was notably tremulous and became tachycardic up to the 130s with A-fib RVR.  Increased work of breathing as well.  Chest x-ray repeated without any acute changes.  Given his difficulty with minimal activity, I believe patient will benefit from admission to the hospital for continued treatment.  Case discussed with Dr. Bridgett Larsson from the hospitalist service who agreed.       Final Clinical Impression(s) / ED Diagnoses Final diagnoses:  Community acquired pneumonia of left lower lobe of lung  Acute UTI  Atrial fibrillation with RVR ([place])           This chart was dictated using voice recognition software.  Despite best efforts to proofread,  errors can occur which can change the documentation meaning.    Fatima Blank, MD 01/30/22 [phone]

## 2022-01-30 ENCOUNTER — Emergency Department (HOSPITAL_COMMUNITY): Payer: Medicare Other

## 2022-01-30 DIAGNOSIS — E876 Hypokalemia: Secondary | ICD-10-CM | POA: Diagnosis present

## 2022-01-30 DIAGNOSIS — R509 Fever, unspecified: Secondary | ICD-10-CM | POA: Diagnosis not present

## 2022-01-30 DIAGNOSIS — I2510 Atherosclerotic heart disease of native coronary artery without angina pectoris: Secondary | ICD-10-CM | POA: Diagnosis present

## 2022-01-30 DIAGNOSIS — Z87891 Personal history of nicotine dependence: Secondary | ICD-10-CM | POA: Diagnosis not present

## 2022-01-30 DIAGNOSIS — N3000 Acute cystitis without hematuria: Secondary | ICD-10-CM | POA: Diagnosis present

## 2022-01-30 DIAGNOSIS — R6510 Systemic inflammatory response syndrome (SIRS) of non-infectious origin without acute organ dysfunction: Secondary | ICD-10-CM | POA: Insufficient documentation

## 2022-01-30 DIAGNOSIS — I5022 Chronic systolic (congestive) heart failure: Secondary | ICD-10-CM | POA: Diagnosis present

## 2022-01-30 DIAGNOSIS — E782 Mixed hyperlipidemia: Secondary | ICD-10-CM | POA: Diagnosis present

## 2022-01-30 DIAGNOSIS — N400 Enlarged prostate without lower urinary tract symptoms: Secondary | ICD-10-CM | POA: Diagnosis not present

## 2022-01-30 DIAGNOSIS — H401190 Primary open-angle glaucoma, unspecified eye, stage unspecified: Secondary | ICD-10-CM | POA: Diagnosis present

## 2022-01-30 DIAGNOSIS — I10 Essential (primary) hypertension: Secondary | ICD-10-CM | POA: Diagnosis not present

## 2022-01-30 DIAGNOSIS — Z803 Family history of malignant neoplasm of breast: Secondary | ICD-10-CM | POA: Diagnosis not present

## 2022-01-30 DIAGNOSIS — J189 Pneumonia, unspecified organism: Secondary | ICD-10-CM | POA: Diagnosis present

## 2022-01-30 DIAGNOSIS — Z79899 Other long term (current) drug therapy: Secondary | ICD-10-CM | POA: Diagnosis not present

## 2022-01-30 DIAGNOSIS — Z800 Family history of malignant neoplasm of digestive organs: Secondary | ICD-10-CM | POA: Diagnosis not present

## 2022-01-30 DIAGNOSIS — Z7901 Long term (current) use of anticoagulants: Secondary | ICD-10-CM | POA: Diagnosis not present

## 2022-01-30 DIAGNOSIS — Z951 Presence of aortocoronary bypass graft: Secondary | ICD-10-CM | POA: Diagnosis not present

## 2022-01-30 DIAGNOSIS — G9341 Metabolic encephalopathy: Secondary | ICD-10-CM | POA: Diagnosis present

## 2022-01-30 DIAGNOSIS — R531 Weakness: Secondary | ICD-10-CM

## 2022-01-30 DIAGNOSIS — I25810 Atherosclerosis of coronary artery bypass graft(s) without angina pectoris: Secondary | ICD-10-CM | POA: Diagnosis not present

## 2022-01-30 DIAGNOSIS — I4821 Permanent atrial fibrillation: Secondary | ICD-10-CM | POA: Diagnosis present

## 2022-01-30 DIAGNOSIS — I110 Hypertensive heart disease with heart failure: Secondary | ICD-10-CM | POA: Diagnosis present

## 2022-01-30 DIAGNOSIS — R0902 Hypoxemia: Secondary | ICD-10-CM | POA: Diagnosis present

## 2022-01-30 DIAGNOSIS — R0602 Shortness of breath: Secondary | ICD-10-CM | POA: Diagnosis not present

## 2022-01-30 DIAGNOSIS — N401 Enlarged prostate with lower urinary tract symptoms: Secondary | ICD-10-CM | POA: Diagnosis present

## 2022-01-30 DIAGNOSIS — N390 Urinary tract infection, site not specified: Secondary | ICD-10-CM | POA: Insufficient documentation

## 2022-01-30 DIAGNOSIS — Z66 Do not resuscitate: Secondary | ICD-10-CM | POA: Diagnosis present

## 2022-01-30 DIAGNOSIS — Z1152 Encounter for screening for COVID-19: Secondary | ICD-10-CM | POA: Diagnosis not present

## 2022-01-30 DIAGNOSIS — I480 Paroxysmal atrial fibrillation: Secondary | ICD-10-CM | POA: Diagnosis not present

## 2022-01-30 LAB — APTT: aPTT: 40 seconds — ABNORMAL HIGH (ref 24–36)

## 2022-01-30 LAB — COMPREHENSIVE METABOLIC PANEL
ALT: 30 U/L (ref 0–44)
ALT: 27 U/L (ref 0–44)
AST: 39 U/L (ref 15–41)
AST: 38 U/L (ref 15–41)
Albumin: 3.3 g/dL — ABNORMAL LOW (ref 3.5–5.0)
Albumin: 3.3 g/dL — ABNORMAL LOW (ref 3.5–5.0)
Alkaline Phosphatase: 101 U/L (ref 38–126)
Alkaline Phosphatase: 103 U/L (ref 38–126)
Anion gap: 7 (ref 5–15)
Anion gap: 8 (ref 5–15)
BUN: 14 mg/dL (ref 8–23)
BUN: 15 mg/dL (ref 8–23)
CO2: 24 mmol/L (ref 22–32)
CO2: 25 mmol/L (ref 22–32)
Calcium: 8.1 mg/dL — ABNORMAL LOW (ref 8.9–10.3)
Calcium: 8 mg/dL — ABNORMAL LOW (ref 8.9–10.3)
Chloride: 102 mmol/L (ref 98–111)
Chloride: 103 mmol/L (ref 98–111)
Creatinine, Ser: 0.94 mg/dL (ref 0.61–1.24)
Creatinine, Ser: 0.73 mg/dL (ref 0.61–1.24)
GFR, Estimated: >60 mL/min (ref >60)
GFR, Estimated: >60 mL/min (ref >60)
Glucose, Bld: 146 mg/dL — ABNORMAL HIGH (ref 70–99)
Glucose, Bld: 132 mg/dL — ABNORMAL HIGH (ref 70–99)
Potassium: 3.3 mmol/L — ABNORMAL LOW (ref 3.5–5.1)
Potassium: 3.6 mmol/L (ref 3.5–5.1)
Sodium: 133 mmol/L — ABNORMAL LOW (ref 135–145)
Sodium: 136 mmol/L (ref 135–145)
Total Bilirubin: 2.2 mg/dL — ABNORMAL HIGH (ref 0.3–1.2)
Total Bilirubin: 1.5 mg/dL — ABNORMAL HIGH (ref 0.3–1.2)
Total Protein: 6.2 g/dL — ABNORMAL LOW (ref 6.5–8.1)
Total Protein: 6.6 g/dL (ref 6.5–8.1)

## 2022-01-30 LAB — LACTIC ACID, PLASMA: Lactic Acid, Venous: 1.2 mmol/L (ref 0.5–1.9)

## 2022-01-30 LAB — CBC WITH DIFFERENTIAL/PLATELET
Abs Immature Granulocytes: 0.04 10*3/uL (ref 0.00–0.07)
Basophils Absolute: 0 10*3/uL (ref 0.0–0.1)
Basophils Relative: 0 %
Eosinophils Absolute: 0 10*3/uL (ref 0.0–0.5)
Eosinophils Relative: 0 %
HCT: 35.4 % — ABNORMAL LOW (ref 39.0–52.0)
Hemoglobin: 11.8 g/dL — ABNORMAL LOW (ref 13.0–17.0)
Immature Granulocytes: 0 %
Lymphocytes Relative: 7 %
Lymphs Abs: 0.6 10*3/uL — ABNORMAL LOW (ref 0.7–4.0)
MCH: 31.1 pg (ref 26.0–34.0)
MCHC: 33.3 g/dL (ref 30.0–36.0)
MCV: 93.4 fL (ref 80.0–100.0)
Monocytes Absolute: 1.2 10*3/uL — ABNORMAL HIGH (ref 0.1–1.0)
Monocytes Relative: 13 %
Neutro Abs: 7.4 10*3/uL (ref 1.7–7.7)
Neutrophils Relative %: 80 %
Platelets: 141 10*3/uL — ABNORMAL LOW (ref 150–400)
RBC: 3.79 MIL/uL — ABNORMAL LOW (ref 4.22–5.81)
RDW: 14.3 % (ref 11.5–15.5)
WBC: 9.3 10*3/uL (ref 4.0–10.5)
nRBC: 0 % (ref 0.0–0.2)

## 2022-01-30 LAB — URINALYSIS, ROUTINE W REFLEX MICROSCOPIC
Bilirubin Urine: NEGATIVE
Glucose, UA: NEGATIVE mg/dL
Ketones, ur: 5 mg/dL — AB
Nitrite: NEGATIVE
Protein, ur: NEGATIVE mg/dL
Specific Gravity, Urine: 1.016 (ref 1.005–1.030)
WBC, UA: >50 WBC/hpf — ABNORMAL HIGH (ref 0–5)
pH: 5 (ref 5.0–8.0)

## 2022-01-30 LAB — RESP PANEL BY RT-PCR (RSV, FLU A&B, COVID)  RVPGX2
Influenza A by PCR: NEGATIVE
Influenza B by PCR: NEGATIVE
Resp Syncytial Virus by PCR: NEGATIVE
SARS Coronavirus 2 by RT PCR: NEGATIVE

## 2022-01-30 LAB — PROTIME-INR
INR: 1.3 — ABNORMAL HIGH (ref 0.8–1.2)
Prothrombin Time: 16.4 seconds — ABNORMAL HIGH (ref 11.4–15.2)

## 2022-01-30 LAB — MAGNESIUM: Magnesium: 1.9 mg/dL (ref 1.7–2.4)

## 2022-01-30 MED ORDER — SODIUM CHLORIDE 0.9 % IV SOLN
2.0000 g | INTRAVENOUS | Status: DC
  Administered 2022-01-30 – 2022-01-31 (×2): 2 g via INTRAVENOUS
  Filled 2022-01-30 (×2): qty 20

## 2022-01-30 MED ORDER — ACETAMINOPHEN 650 MG RE SUPP: 650.0000 mg | Freq: Four times a day (QID) | RECTAL | Status: DC | PRN

## 2022-01-30 MED ORDER — POTASSIUM CHLORIDE CRYS ER 20 MEQ PO TBCR
40.0000 meq | EXTENDED_RELEASE_TABLET | Freq: Every day | ORAL | Status: DC
  Administered 2022-01-30 – 2022-02-01 (×3): 40 meq via ORAL
  Filled 2022-01-30 (×3): qty 2

## 2022-01-30 MED ORDER — GUAIFENESIN ER 600 MG PO TB12
600.0000 mg | ORAL_TABLET | Freq: Two times a day (BID) | ORAL | Status: DC
  Administered 2022-01-30 – 2022-02-01 (×4): 600 mg via ORAL
  Filled 2022-01-30 (×4): qty 1

## 2022-01-30 MED ORDER — ONDANSETRON HCL 4 MG/2ML IJ SOLN: 4.0000 mg | Freq: Four times a day (QID) | INTRAMUSCULAR | Status: DC | PRN

## 2022-01-30 MED ORDER — ACETAMINOPHEN 325 MG PO TABS
650.0000 mg | ORAL_TABLET | Freq: Once | ORAL | Status: AC
  Administered 2022-01-30: 650 mg via ORAL
  Filled 2022-01-30: qty 2

## 2022-01-30 MED ORDER — ACETAMINOPHEN 325 MG PO TABS
650.0000 mg | ORAL_TABLET | Freq: Four times a day (QID) | ORAL | Status: DC | PRN
  Administered 2022-01-30 – 2022-01-31 (×3): 650 mg via ORAL
  Filled 2022-01-30 (×3): qty 2

## 2022-01-30 MED ORDER — SODIUM CHLORIDE 0.9 % IV SOLN
500.0000 mg | INTRAVENOUS | Status: DC
  Administered 2022-01-31: 500 mg via INTRAVENOUS
  Filled 2022-01-30: qty 5

## 2022-01-30 MED ORDER — SODIUM CHLORIDE 0.9 % IV SOLN
1.0000 g | Freq: Once | INTRAVENOUS | Status: AC
  Administered 2022-01-30: 1 g via INTRAVENOUS
  Filled 2022-01-30: qty 10

## 2022-01-30 MED ORDER — METOPROLOL TARTRATE 25 MG PO TABS: 25.0000 mg | ORAL_TABLET | Freq: Once | ORAL | Status: DC

## 2022-01-30 MED ORDER — ONDANSETRON HCL 4 MG PO TABS: 4.0000 mg | ORAL_TABLET | Freq: Four times a day (QID) | ORAL | Status: DC | PRN

## 2022-01-30 MED ORDER — SODIUM CHLORIDE 0.9 % IV SOLN
500.0000 mg | Freq: Once | INTRAVENOUS | Status: AC
  Administered 2022-01-30: 500 mg via INTRAVENOUS
  Filled 2022-01-30: qty 5

## 2022-01-30 NOTE — H&P (Signed)
History and Physical    Patient: Larry Lopez ZCH:[id number] DOB: 1926/04/17 DOA: 01/29/2022 DOS: the patient was seen and examined on 01/30/2022 PCP: Hoyt Koch, MD  Patient coming from: Home  Chief Complaint:  Chief Complaint  Patient presents with   Fever   Weakness   HPI: Larry Lopez is a 89 y.o. male with medical history significant of glaucoma, HTN, a fib, CAD. Presenting with fever and weakness. He was in his normal state of health until after lunch yesterday. His wife says he was very tired and went to sleep. He didn't wake up as normal for the 6p news. He felt feverish. He had some cough. He tried to get up later in the evening to go to the bathroom, but seemed unsteady and very weak. She told him to lie in bed and she called EMS. They deny any other aggravating or alleviating factors.    Review of Systems: As mentioned in the history of present illness. All other systems reviewed and are negative. Past Medical History:  Diagnosis Date   Atrial fibrillation (HCC)    Bradycardia    CAD (coronary artery disease)    Glaucoma    HLD (hyperlipidemia)    HTN (hypertension)    Tachy-brady syndrome (HCC)    Past Surgical History:  Procedure Laterality Date   CARDIAC SURGERY     CARDIOVERSION N/A 02/06/2013   Procedure: CARDIOVERSION;  Surgeon: Jolaine Artist, MD;  Location: [facility];  Service: Cardiovascular;  Laterality: N/A;   CORONARY ARTERY BYPASS GRAFT     TONSILLECTOMY AND ADENOIDECTOMY     Social History:  reports that he quit smoking about 55 years ago. His smoking use included cigarettes. He has never used smokeless tobacco. He reports current alcohol use of about 28.0 standard drinks of alcohol per week. He reports that he does not use drugs.  No Known Allergies  Family History  Problem Relation Age of Onset   Cancer Father        Deceased, 61   Breast cancer Mother        Deceased, 45   Breast cancer Sister    Colon cancer Daughter         Living   Diabetes Neg Hx    Coronary artery disease Neg Hx     Prior to Admission medications   Medication Sig Start Date End Date Taking? Authorizing Provider  atorvastatin (LIPITOR) 40 MG tablet Take 1 tablet by mouth once daily 01/05/22   Bensimhon, Shaune Pascal, MD  B Complex-C (SUPER B COMPLEX PO) Take 1 tablet by mouth daily.    [provider]  calcium carbonate (OSCAL) 1500 (600 Ca) MG TABS tablet Take 1 tablet by mouth daily.    [provider]  donepezil (ARICEPT) 10 MG tablet Take 1 tablet (10 mg total) by mouth at bedtime. 09/03/21   Hoyt Koch, MD  ELIQUIS 2.5 MG TABS tablet Take 1 tablet by mouth twice daily 07/27/21   Bensimhon, Shaune Pascal, MD  Emollient (CERAVE) CREA Apply topically. Applied to scalp as needed    [provider]  Ginger, Zingiber officinalis, (GINGER PO) See admin instructions. Chew a small portion of ginger root as needed for indigestion    [provider]  hydrochlorothiazide (HYDRODIURIL) 25 MG tablet Take 1 tablet by mouth once daily 12/21/21   Bensimhon, Shaune Pascal, MD  latanoprost (XALATAN) 0.005 % ophthalmic solution  06/26/18   [provider]  metoprolol tartrate (LOPRESSOR) 25 MG  tablet TAKE 1 TABLET BY MOUTH ONCE DAILY AS NEEDED. ONLY  USES  IF  NEEDED  FOR  TACHYCARDIA.  HAS  NOT  NEEDED  IN  OVER  A  YEAR Patient not taking: Reported on 11/19/2021 11/01/18   Bensimhon, Shaune Pascal, MD  Multiple Vitamin (MULTI-VITAMIN) tablet Take by mouth.    [provider]  nitroGLYCERIN (NITROSTAT) 0.4 MG SL tablet Place 1 tablet (0.4 mg total) under the tongue every 5 (five) minutes as needed for chest pain. Patient not taking: Reported on 11/19/2021 04/18/17   Bensimhon, Shaune Pascal, MD  tamsulosin (FLOMAX) 0.4 MG CAPS capsule Take 1 capsule (0.4 mg total) by mouth daily. 02/03/21   Hoyt Koch, MD  timolol (TIMOPTIC) 0.5 % ophthalmic solution INSTILL 1 DROP INTO [facility] EYE TWICE DAILY 06/27/18   [provider]  triamcinolone cream (KENALOG) 0.1 % APPLY  CREAM TOPICALLY TO AFFECTED AREA TWICE DAILY 05/21/20   Hoyt Koch, MD    Physical Exam: Vitals:   01/30/22 0530 01/30/22 0600 01/30/22 0620 01/30/22 0630  BP: (!) 140/63 (!) 151/70  (!) 147/82  Pulse: 85 88  (!) 101  Resp: 13 14  (!) 26  Temp:   (!) 102.5 F (39.2 C)   TempSrc:   Oral   SpO2: 94% 92%  96%   General: 89 y.o. male resting in bed in NAD Eyes: PERRL, normal sclera ENMT: Nares patent w/o discharge, orophaynx clear, dentition normal, ears w/o discharge/lesions/ulcers Neck: Supple, trachea midline Cardiovascular: tachy, +S1, S2, no m/g/r, equal pulses throughout Respiratory: decreased at bases, no w/r/r, normal WOB GI: BS+, NDNT, no masses noted, no organomegaly noted MSK: No e/c/c Skin: No rashes, bruises, ulcerations noted Neuro: A&O x 3, other than HoH no focal deficits Psyc: Appropriate interaction and affect, calm/cooperative  Data Reviewed:  Results for orders placed or performed during the hospital encounter of 01/29/22 (from the past 24 hour(s))  Resp panel by RT-PCR (RSV, Flu A&B, Covid) Anterior Nasal Swab     Status: None   Collection Time: 01/29/22 11:32 PM   Specimen: Anterior Nasal Swab  Result Value Ref Range   SARS Coronavirus 2 by RT PCR NEGATIVE NEGATIVE   Influenza A by PCR NEGATIVE NEGATIVE   Influenza B by PCR NEGATIVE NEGATIVE   Resp Syncytial Virus by PCR NEGATIVE NEGATIVE  Lactic acid, plasma     Status: None   Collection Time: 01/29/22 11:45 PM  Result Value Ref Range   Lactic Acid, Venous 1.2 0.5 - 1.9 mmol/L  Comprehensive metabolic panel     Status: Abnormal   Collection Time: 01/29/22 11:45 PM  Result Value Ref Range   Sodium 133 (L) 135 - 145 mmol/L   Potassium 3.3 (L) 3.5 - 5.1 mmol/L   Chloride 102 98 - 111 mmol/L   CO2 24 22 - 32 mmol/L   Glucose, Bld 146 (H) 70 - 99 mg/dL   BUN 14 8 - 23 mg/dL   Creatinine, Ser 0.94 0.61 - 1.24 mg/dL   Calcium 8.1 (L)  8.9 - 10.3 mg/dL   Total Protein 6.2 (L) 6.5 - 8.1 g/dL   Albumin 3.3 (L) 3.5 - 5.0 g/dL   AST 39 15 - 41 U/L   ALT 30 0 - 44 U/L   Alkaline Phosphatase 101 38 - 126 U/L   Total Bilirubin 2.2 (H) 0.3 - 1.2 mg/dL   GFR, Estimated >60 >60 mL/min   Anion gap 7 5 - 15  CBC with Differential  Status: Abnormal   Collection Time: 01/29/22 11:45 PM  Result Value Ref Range   WBC 9.3 4.0 - 10.5 K/uL   RBC 3.79 (L) 4.22 - 5.81 MIL/uL   Hemoglobin 11.8 (L) 13.0 - 17.0 g/dL   HCT 35.4 (L) 39.0 - 52.0 %   MCV 93.4 80.0 - 100.0 fL   MCH 31.1 26.0 - 34.0 pg   MCHC 33.3 30.0 - 36.0 g/dL   RDW 14.3 11.5 - 15.5 %   Platelets 141 (L) 150 - 400 K/uL   nRBC 0.0 0.0 - 0.2 %   Neutrophils Relative % 80 %   Neutro Abs 7.4 1.7 - 7.7 K/uL   Lymphocytes Relative 7 %   Lymphs Abs 0.6 (L) 0.7 - 4.0 K/uL   Monocytes Relative 13 %   Monocytes Absolute 1.2 (H) 0.1 - 1.0 K/uL   Eosinophils Relative 0 %   Eosinophils Absolute 0.0 0.0 - 0.5 K/uL   Basophils Relative 0 %   Basophils Absolute 0.0 0.0 - 0.1 K/uL   Immature Granulocytes 0 %   Abs Immature Granulocytes 0.04 0.00 - 0.07 K/uL  Protime-INR     Status: Abnormal   Collection Time: 01/29/22 11:45 PM  Result Value Ref Range   Prothrombin Time 16.4 (H) 11.4 - 15.2 seconds   INR 1.3 (H) 0.8 - 1.2  APTT     Status: Abnormal   Collection Time: 01/29/22 11:45 PM  Result Value Ref Range   aPTT 40 (H) 24 - 36 seconds  Urinalysis, Routine w reflex microscopic Urine, Clean Catch     Status: Abnormal   Collection Time: 01/30/22  1:06 AM  Result Value Ref Range   Color, Urine YELLOW YELLOW   APPearance HAZY (A) CLEAR   Specific Gravity, Urine 1.016 1.005 - 1.030   pH 5.0 5.0 - 8.0   Glucose, UA NEGATIVE NEGATIVE mg/dL   Hgb urine dipstick SMALL (A) NEGATIVE   Bilirubin Urine NEGATIVE NEGATIVE   Ketones, ur 5 (A) NEGATIVE mg/dL   Protein, ur NEGATIVE NEGATIVE mg/dL   Nitrite NEGATIVE NEGATIVE   Leukocytes,Ua LARGE (A) NEGATIVE   RBC / HPF 6-10 0 - 5  RBC/hpf   WBC, UA >50 (H) 0 - 5 WBC/hpf   Bacteria, UA MANY (A) NONE SEEN   Squamous Epithelial / HPF 0-5 0 - 5 /HPF   Mucus PRESENT    CXR: Mild airspace disease at the left lung base, possible atelectasis or infiltrate.  EKG: a fib, no st elevations  Assessment and Plan: [facility] PNA SIRS     - placed in obs, tele     - COVID/flu/RSV negative     - check urine legionella/strep     - continue abx     - add guaifenesin     UTI     - continue rocephin     - follow UCx  Hypokalemia     - replace K+; check Mg2+  Generalized weakness     - likely secondary to above     - PT eval  Permanent a fib     - continue home regimen when confirmed  CAD     - continue home regimen when confirmed  Glaucoma     - continue home regimen when confirmed  HLD     - continue home regimen when confirmed  HTN     - continue home regimen when confirmed   Advance Care Planning:   Code Status: DNR  Consults: None  Family  Communication: w/ wife at bedside  Severity of Illness: The appropriate patient status for this patient is OBSERVATION. Observation status is judged to be reasonable and necessary in order to provide the required intensity of service to ensure the patient's safety. The patient's presenting symptoms, physical exam findings, and initial radiographic and laboratory data in the context of their medical condition is felt to place them at decreased risk for further clinical deterioration. Furthermore, it is anticipated that the patient will be medically stable for discharge from the hospital within 2 midnights of admission.   Author: Jonnie Finner, DO 01/30/2022 7:27 AM  For on call review [email or web address].

## 2022-01-31 DIAGNOSIS — I25810 Atherosclerosis of coronary artery bypass graft(s) without angina pectoris: Secondary | ICD-10-CM | POA: Diagnosis not present

## 2022-01-31 DIAGNOSIS — J189 Pneumonia, unspecified organism: Secondary | ICD-10-CM | POA: Diagnosis not present

## 2022-01-31 DIAGNOSIS — I480 Paroxysmal atrial fibrillation: Secondary | ICD-10-CM | POA: Diagnosis not present

## 2022-01-31 DIAGNOSIS — E782 Mixed hyperlipidemia: Secondary | ICD-10-CM

## 2022-01-31 DIAGNOSIS — I10 Essential (primary) hypertension: Secondary | ICD-10-CM

## 2022-01-31 DIAGNOSIS — N400 Enlarged prostate without lower urinary tract symptoms: Secondary | ICD-10-CM | POA: Diagnosis present

## 2022-01-31 DIAGNOSIS — N3000 Acute cystitis without hematuria: Secondary | ICD-10-CM | POA: Diagnosis not present

## 2022-01-31 DIAGNOSIS — I5022 Chronic systolic (congestive) heart failure: Secondary | ICD-10-CM | POA: Diagnosis present

## 2022-01-31 DIAGNOSIS — E876 Hypokalemia: Secondary | ICD-10-CM

## 2022-01-31 DIAGNOSIS — R531 Weakness: Secondary | ICD-10-CM

## 2022-01-31 LAB — COMPREHENSIVE METABOLIC PANEL WITH GFR
ALT: 28 U/L (ref 0–44)
AST: 35 U/L (ref 15–41)
Albumin: 2.9 g/dL — ABNORMAL LOW (ref 3.5–5.0)
Alkaline Phosphatase: 88 U/L (ref 38–126)
Anion gap: 9 (ref 5–15)
BUN: 19 mg/dL (ref 8–23)
CO2: 21 mmol/L — ABNORMAL LOW (ref 22–32)
Calcium: 7.8 mg/dL — ABNORMAL LOW (ref 8.9–10.3)
Chloride: 107 mmol/L (ref 98–111)
Creatinine, Ser: 0.9 mg/dL (ref 0.61–1.24)
GFR, Estimated: >60 mL/min (ref >60)
Glucose, Bld: 101 mg/dL — ABNORMAL HIGH (ref 70–99)
Potassium: 3.7 mmol/L (ref 3.5–5.1)
Sodium: 137 mmol/L (ref 135–145)
Total Bilirubin: 1.2 mg/dL (ref 0.3–1.2)
Total Protein: 5.8 g/dL — ABNORMAL LOW (ref 6.5–8.1)

## 2022-01-31 LAB — CBC
HCT: 35.5 % — ABNORMAL LOW (ref 39.0–52.0)
Hemoglobin: 11.9 g/dL — ABNORMAL LOW (ref 13.0–17.0)
MCH: 31.1 pg (ref 26.0–34.0)
MCHC: 33.5 g/dL (ref 30.0–36.0)
MCV: 92.7 fL (ref 80.0–100.0)
Platelets: 153 K/uL (ref 150–400)
RBC: 3.83 MIL/uL — ABNORMAL LOW (ref 4.22–5.81)
RDW: 14.5 % (ref 11.5–15.5)
WBC: 9.5 K/uL (ref 4.0–10.5)
nRBC: 0 % (ref 0.0–0.2)

## 2022-01-31 LAB — STREP PNEUMONIAE URINARY ANTIGEN: Strep Pneumo Urinary Antigen: NEGATIVE

## 2022-01-31 MED ORDER — ALBUTEROL SULFATE (2.5 MG/3ML) 0.083% IN NEBU
2.5000 mg | INHALATION_SOLUTION | RESPIRATORY_TRACT | Status: DC | PRN
  Administered 2022-01-31 – 2022-02-01 (×2): 2.5 mg via RESPIRATORY_TRACT
  Filled 2022-01-31 (×2): qty 3

## 2022-01-31 MED ORDER — HYDRALAZINE HCL 20 MG/ML IJ SOLN: 10.0000 mg | Freq: Four times a day (QID) | INTRAMUSCULAR | Status: DC | PRN

## 2022-01-31 MED ORDER — ATORVASTATIN CALCIUM 40 MG PO TABS
40.0000 mg | ORAL_TABLET | Freq: Every day | ORAL | Status: DC
  Administered 2022-01-31 – 2022-02-01 (×2): 40 mg via ORAL
  Filled 2022-01-31 (×2): qty 1

## 2022-01-31 MED ORDER — GUAIFENESIN-DM 100-10 MG/5ML PO SYRP
5.0000 mL | ORAL_SOLUTION | ORAL | Status: DC | PRN
  Administered 2022-01-31 – 2022-02-01 (×3): 5 mL via ORAL
  Filled 2022-01-31 (×3): qty 5

## 2022-01-31 MED ORDER — AZITHROMYCIN 250 MG PO TABS
500.0000 mg | ORAL_TABLET | Freq: Every day | ORAL | Status: DC
  Administered 2022-02-01: 500 mg via ORAL
  Filled 2022-01-31: qty 2

## 2022-01-31 MED ORDER — TAMSULOSIN HCL 0.4 MG PO CAPS
0.4000 mg | ORAL_CAPSULE | Freq: Every day | ORAL | Status: DC
  Administered 2022-01-31 – 2022-02-01 (×2): 0.4 mg via ORAL
  Filled 2022-01-31 (×2): qty 1

## 2022-01-31 MED ORDER — LATANOPROST 0.005 % OP SOLN
1.0000 [drp] | Freq: Every day | OPHTHALMIC | Status: DC
  Administered 2022-01-31: 1 [drp] via OPHTHALMIC
  Filled 2022-01-31: qty 2.5

## 2022-01-31 MED ORDER — TIMOLOL MALEATE 0.5 % OP SOLN
1.0000 [drp] | Freq: Two times a day (BID) | OPHTHALMIC | Status: DC
  Administered 2022-01-31 – 2022-02-01 (×3): 1 [drp] via OPHTHALMIC
  Filled 2022-01-31: qty 5

## 2022-01-31 MED ORDER — APIXABAN 2.5 MG PO TABS
2.5000 mg | ORAL_TABLET | Freq: Two times a day (BID) | ORAL | Status: DC
  Administered 2022-01-31 – 2022-02-01 (×3): 2.5 mg via ORAL
  Filled 2022-01-31 (×3): qty 1

## 2022-01-31 MED ORDER — NITROGLYCERIN 0.4 MG SL SUBL: 0.4000 mg | SUBLINGUAL_TABLET | SUBLINGUAL | Status: DC | PRN

## 2022-01-31 NOTE — Assessment & Plan Note (Signed)
Reported history of hypertension although patient is not currently on any antihypertensive therapy Blood pressures are slightly elevated however patient will be managed with conservative blood pressure targets due to advanced age. As needed intravenous hydralazine for markedly elevated blood pressure.

## 2022-01-31 NOTE — Assessment & Plan Note (Signed)
Bouts of rapid atrial fibrillation in the emergency department have resolved with treatment of underlying infection Currently rate controlled without scheduled AV nodal blocking therapy Continue home regimen of anticoagulation with Eliquis Monitoring on telemetry

## 2022-01-31 NOTE — Progress Notes (Signed)
PROGRESS NOTE   Larry Lopez  PRF:[id number] DOB: Apr 07, 1926 DOA: 01/29/2022 PCP: Hoyt Koch, MD   Date of Service: the patient was seen and examined on 01/31/2022  Brief Narrative:  89 year old male with past medical history of coronary artery disease (status post CABG with re-do CABG 2002), paroxysmal atrial fibrillation (on Eliquis, S/P DC cadrdioversion 9357) complicated by tachybradycardia syndrome (no PPM, conservative mgmt), systolic congestive heart failure (Echo 07/2013 EF 40-45%),  hyperlipidemia, benign prostatic hyperplasia, hypertension who presents to [facility] emergency department via EMS due to family concerns for severe lethargy.  Upon evaluation in the emergency department patient was found to exhibit multiple SIRS criteria including be febrile at 102.5 F.  Chest imaging appeared concerning for developing left lower lobe pneumonia.  Furthermore urinalysis appeared suggestive of urinary tract infection.  The hospitalist group was then called to assess the patient for admission to the hospital.  Patient was placed on intravenous ceftriaxone and azithromycin.  COVID-19/influenza/RSV PCR testing were performed and were found to be negative.  Initial hospital course was complicated by rapid atrial fibrillation with heart rates in excess of 130 bpm.  With intravenous fluids and treatment of underlying infection patient spontaneously reverted back to normal sinus rhythm.   Assessment and Plan: * Acute cystitis without hematuria Urinalysis suggestive of urinary tract infection upon evaluation in the emergency department Difficult say as to whether this is asymptomatic bacteriuria or true urinary tract infection, it is possible that a urinary tract infection is contributing to the patient's encephalopathy Unfortunately urine culture was never obtained in the emergency department and would likely be negative if obtained now. Patient is already on intravenous  ceftriaxone and azithromycin which should cover for typical causative organisms anyway  Pneumonia of left lower lobe due to infectious organism Substantial clinical improvement with improving mentation and weakness. Fever curve downtrending although patient is still having low-grade temperatures today. Continue intravenous ceftriaxone and azithromycin Blood cultures without growth so far Strep pneumoniae urinary antigen currently negative Legionella antigen still pending Supplemental oxygen as necessary for bouts of hypoxia  Paroxysmal atrial fibrillation with rapid ventricular response (HCC) Bouts of rapid atrial fibrillation in the emergency department have resolved with treatment of underlying infection Currently rate controlled without scheduled AV nodal blocking therapy Continue home regimen of anticoagulation with Eliquis Monitoring on telemetry   Coronary artery disease involving native heart without angina pectoris Patient is currently chest pain free Monitoring patient on telemetry On Eliquis in place of antiplatelet therapy, on statin therapy.   No AV nodal blocking therapy due to known history of tachybradycardia syndrome  As needed nitroglycerin for episodes of chest discomfort.     Chronic systolic CHF (congestive heart failure) (HCC) No clinical evidence of cardiogenic volume overload Strict input and output monitoring Daily weights Low-sodium diet   Essential hypertension Reported history of hypertension although patient is not currently on any antihypertensive therapy Blood pressures are slightly elevated however patient will be managed with conservative blood pressure targets due to advanced age. As needed intravenous hydralazine for markedly elevated blood pressure.  Mixed hyperlipidemia Continuing home regimen of lipid lowering therapy.   BPH without obstruction/lower urinary tract symptoms Continue home regimen of tamsulosin    Subjective:  Patient  reports he still has a persisting cough.  Cough is intermittently productive and seemingly improved since yesterday.  Patient denies any associated chest pain or shortness of breath.  Patient denies any dysuria.  Patient does complain of a small amount of blood coming  from the head of the urethral meatus.  Physical Exam:  Vitals:   01/31/22 0043 01/31/22 0553 01/31/22 1427 01/31/22 1431  BP: (!) 160/80 (!) 152/75 (!) 148/62 (!) 162/80  Pulse: 90 93 64 91  Resp: '17 19 18   '$ Temp: 99.9 F (37.7 C) 99.9 F (37.7 C) 98.3 F (36.8 C) 98 F (36.7 C)  TempSrc: Oral Oral  Oral  SpO2: 97% 96% 100% 97%    Constitutional: Awake alert and oriented x3, no associated distress.   Skin: no rashes, no lesions, poor skin are noted. Eyes: Pupils are equally reactive to light.  No evidence of scleral icterus or conjunctival pallor.  ENMT: Moist mucous membranes noted.  Posterior pharynx clear of any exudate or lesions.   Respiratory: Bibasilar rales with scattered rhonchi, intermittent expiratory wheezing noted. Normal respiratory effort. No accessory muscle use.  Cardiovascular: Regular rate and rhythm, no murmurs / rubs / gallops. No extremity edema. 2+ pedal pulses. No carotid bruits.  Abdomen: Abdomen is soft and nontender.  No evidence of intra-abdominal masses.  Positive bowel sounds noted in all quadrants.   Musculoskeletal: No joint deformity upper and lower extremities. Good ROM, no contractures. Normal muscle tone.    Data Reviewed:  I have personally reviewed and interpreted labs, imaging.  Significant findings are   CBC: Recent Labs  Lab 01/29/22 2345 01/31/22 0751  WBC 9.3 9.5  NEUTROABS 7.4  --   HGB 11.8* 11.9*  HCT 35.4* 35.5*  MCV 93.4 92.7  PLT 141* 944   Basic Metabolic Panel: Recent Labs  Lab 01/29/22 2345 01/30/22 1159 01/31/22 0751  NA 133* 136 137  K 3.3* 3.6 3.7  CL 102 103 107  CO2 24 25 21*  GLUCOSE 146* 132* 101*  BUN '14 15 19  '$ CREATININE 0.94 0.73 0.90   CALCIUM 8.1* 8.0* 7.8*  MG  --  1.9  --    GFR: CrCl cannot be calculated (Unknown ideal weight.). Liver Function Tests: Recent Labs  Lab 01/29/22 2345 01/30/22 1159 01/31/22 0751  AST 39 38 35  ALT '30 27 28  '$ ALKPHOS 101 103 88  BILITOT 2.2* 1.5* 1.2  PROT 6.2* 6.6 5.8*  ALBUMIN 3.3* 3.3* 2.9*    Coagulation Profile: Recent Labs  Lab 01/29/22 2345  INR 1.3*     EKG/Telemetry: Personally reviewed.  Rhythm is normal sinus rhythm with heart rate of 80 bpm.  No dynamic ST segment changes appreciated.   Code Status:  DNR.  Code status decision has been confirmed with: patient/wife Family Communication: Wife has been updated on plan of care via phone conversation (1/21)   Severity of Illness:  The appropriate patient status for this patient is INPATIENT. Inpatient status is judged to be reasonable and necessary in order to provide the required intensity of service to ensure the patient's safety. The patient's presenting symptoms, physical exam findings, and initial radiographic and laboratory data in the context of their chronic comorbidities is felt to place them at high risk for further clinical deterioration. Furthermore, it is not anticipated that the patient will be medically stable for discharge from the hospital within 2 midnights of admission.   * I certify that at the point of admission it is my clinical judgment that the patient will require inpatient hospital care spanning beyond 2 midnights from the point of admission due to high intensity of service, high risk for further deterioration and high frequency of surveillance required.*  Time spent:  59 minutes  Author:  Iona Beard  Dossie Arbour MD  01/31/2022 6:59 PM

## 2022-01-31 NOTE — Assessment & Plan Note (Signed)
Continue home regimen of tamsulosin

## 2022-01-31 NOTE — Assessment & Plan Note (Signed)
Patient is currently chest pain free Monitoring patient on telemetry On Eliquis in place of antiplatelet therapy, on statin therapy.   No AV nodal blocking therapy due to known history of tachybradycardia syndrome  As needed nitroglycerin for episodes of chest discomfort.

## 2022-01-31 NOTE — Assessment & Plan Note (Signed)
Substantial clinical improvement with improving mentation and weakness. Fever curve downtrending although patient is still having low-grade temperatures today. Continue intravenous ceftriaxone and azithromycin Blood cultures without growth so far Strep pneumoniae urinary antigen currently negative Legionella antigen still pending Supplemental oxygen as necessary for bouts of hypoxia

## 2022-01-31 NOTE — Assessment & Plan Note (Addendum)
Urinalysis suggestive of urinary tract infection upon evaluation in the emergency department Difficult say as to whether this is asymptomatic bacteriuria or true urinary tract infection, it is possible that a urinary tract infection is contributing to the patient's encephalopathy Unfortunately urine culture was never obtained in the emergency department and would likely be negative if obtained now. Patient is already on intravenous ceftriaxone and azithromycin which should cover for typical causative organisms anyway

## 2022-01-31 NOTE — Assessment & Plan Note (Signed)
No clinical evidence of cardiogenic volume overload Strict input and output monitoring Daily weights Low-sodium diet

## 2022-01-31 NOTE — Progress Notes (Signed)
PHARMACIST - PHYSICIAN COMMUNICATION  CONCERNING: Antibiotic IV to Oral Route Change Policy  RECOMMENDATION: This patient is receiving azithromycin by the intravenous route.  Based on criteria approved by the Pharmacy and Therapeutics Committee, the antibiotic(s) is/are being converted to the equivalent oral dose form(s).   DESCRIPTION: These criteria include:  Patient being treated for a respiratory tract infection, urinary tract infection, cellulitis or clostridium difficile associated diarrhea if on metronidazole  The patient is not neutropenic and does not exhibit a GI malabsorption state  The patient is eating (either orally or via tube) and/or has been taking other orally administered medications for a least 24 hours  The patient is improving clinically and has a Tmax < 100.5  If you have questions about this conversion, please contact the Pharmacy Department  []  ( [phone] )  [place] []  ( [phone]  [facility] []  ( [phone]  [place] []  ( [phone] )  [facility] [x]  ( [phone] )  [facility]  

## 2022-01-31 NOTE — Hospital Course (Addendum)
68yom  w/ CAD  S/P CABG with re-do CABG 2002, paroxysmal atrial fibrillation (on Eliquis, S/P DC cadrdioversion 3005) complicated by tachybradycardia syndrome (no PPM, conservative mgmt), systolic congestive heart failure (Echo 07/2013 EF 40-45%),  hyperlipidemia, benign prostatic hyperplasia, hypertension  presented to ED due to family concerns for severe lethargy. IN ed:was found to exhibit multiple SIRS criteria including be febrile at 102.5 F.CXR-concerning for developing left lower lobe pneumonia.  Furthermore urinalysis appeared suggestive of urinary tract infection.   Patient was admitted, treated with ceftriaxone azithromycin.COVID-19/influenza/RSV PCR testing were performed and were found to be negative. Initial hospital course was complicated by rapid atrial fibrillation with heart rates in excess of 130 bpm.  With intravenous fluids and treatment of underlying infection patient spontaneously reverted back to normal sinus rhythm.  Patient was monitored 1 additional night to ensure no recurrence of A-fib.Remained hemodynamically stable.  Blood culture no growth to date, urine culture not done. Patient was treated for fever weakness due to lower lobe pneumonia possible UTI contributing.  At this time patient was medically stable afebrile overnight slept well, antibiotic will be changed to p.o. for him to discharge home with wife.  Both of them are comfortable going to facility today.

## 2022-01-31 NOTE — Evaluation (Signed)
Physical Therapy Evaluation Patient Details Name: Larry Lopez MRN: [id number] DOB: 26-Jun-1926 Today's Date: 01/31/2022  History of Present Illness  Pt is 89 yo male admitted 01/29/21 with LLL PNA, SIRS, UTI.  Pt with hx including but not limited to glaucoma, HTN, afib, CAD  Clinical Impression  Pt admitted with above diagnosis. At baseline, pt active and independent.  He enjoys doing exercise and typically can mobilize without any difficulty.  Today , pt requiring min guard for safety but was able to ambulate 120' but with some increased difficulty and gait deficits/decreased balance.  He required multiple attempts to stand and wife reports appears below his baseline.  Pt very motivated and wants to return to his normal active self. VSS on RA.  Pt currently with functional limitations due to the deficits listed below (see PT Problem List). Pt will benefit from skilled PT to increase their independence and safety with mobility to allow discharge to the venue listed below.          Recommendations for follow up therapy are one component of a multi-disciplinary discharge planning process, led by the attending physician.  Recommendations may be updated based on patient status, additional functional criteria and insurance authorization.  Follow Up Recommendations Outpatient PT      Assistance Recommended at Discharge Intermittent Supervision/Assistance  Patient can return home with the following  A little help with walking and/or transfers;A little help with bathing/dressing/bathroom;Assistance with cooking/housework;Help with stairs or ramp for entrance    Equipment Recommendations None recommended by PT  Recommendations for Other Services       Functional Status Assessment Patient has had a recent decline in their functional status and demonstrates the ability to make significant improvements in function in a reasonable and predictable amount of time.     Precautions / Restrictions  Precautions Precautions: Fall      Mobility  Bed Mobility Overal bed mobility: Needs Assistance Bed Mobility: Supine to Sit, Sit to Supine     Supine to sit: Supervision Sit to supine: Supervision   General bed mobility comments: use of momentum to sit    Transfers Overall transfer level: Needs assistance Equipment used: None Transfers: Sit to/from Stand Sit to Stand: Min guard           General transfer comment: Performed x 2; required multiple attempts to stand with cues to take his time and for hand placement; wife reports this is not normal for pt - states he normally stands easily without UE support    Ambulation/Gait Ambulation/Gait assistance: Min guard Gait Distance (Feet): 120 Feet Assistive device: None Gait Pattern/deviations: Step-through pattern, Wide base of support, Shuffle Gait velocity: normal     General Gait Details: Some unsteadiness with occasional shuffle/fast steps and increased forward momentum; wife reports not his normal pattern  Stairs            Wheelchair Mobility    Modified Rankin (Stroke Patients Only)       Balance Overall balance assessment: Needs assistance Sitting-balance support: No upper extremity supported Sitting balance-Leahy Scale: Good     Standing balance support: No upper extremity supported Standing balance-Leahy Scale: Good Standing balance comment: Could ambulate without AD but was unsteady at times                             Pertinent Vitals/Pain Pain Assessment Pain Assessment: No/denies pain    Home Living Family/patient expects to be discharged to::  Private residence Living Arrangements: Spouse/significant other Available Help at Discharge: Family;Available 24 hours/day Type of Home: House Home Access: Stairs to enter Entrance Stairs-Rails: None Entrance Stairs-Number of Steps: 2   Home Layout: One level Home Equipment: Cane - single point      Prior Function Prior Level  of Function : Independent/Modified Independent;Driving             Mobility Comments: could ambulate in community without AD; likes to lift weights and do exercises; no recent falls ADLs Comments: independent     Hand Dominance        Extremity/Trunk Assessment   Upper Extremity Assessment Upper Extremity Assessment: Overall WFL for tasks assessed    Lower Extremity Assessment Lower Extremity Assessment: Overall WFL for tasks assessed    Cervical / Trunk Assessment Cervical / Trunk Assessment: Kyphotic  Communication   Communication: HOH  Cognition Arousal/Alertness: Awake/alert Behavior During Therapy: WFL for tasks assessed/performed Overall Cognitive Status: Within Functional Limits for tasks assessed                                          General Comments General comments (skin integrity, edema, etc.): VSS on RA   Pt did urinate in toilet during session.  Noted small amount of blood -notified RN (RN reports MD aware).   Exercises     Assessment/Plan    PT Assessment Patient needs continued PT services  PT Problem List Decreased strength;Cardiopulmonary status limiting activity;Decreased range of motion;Decreased cognition;Decreased activity tolerance;Decreased balance;Decreased mobility;Decreased safety awareness       PT Treatment Interventions DME instruction;Therapeutic exercise;Gait training;Balance training;Stair training;Functional mobility training;Therapeutic activities;Patient/family education    PT Goals (Current goals can be found in the Care Plan section)  Acute Rehab PT Goals Patient Stated Goal: return home; return to baseline PT Goal Formulation: With patient/family Time For Goal Achievement: 02/14/22 Potential to Achieve Goals: Good    Frequency Min 3X/week     Co-evaluation               AM-PAC PT "6 Clicks" Mobility  Outcome Measure Help needed turning from your back to your side while in a flat bed  without using bedrails?: None Help needed moving from lying on your back to sitting on the side of a flat bed without using bedrails?: A Little Help needed moving to and from a bed to a chair (including a wheelchair)?: A Little Help needed standing up from a chair using your arms (e.g., wheelchair or bedside chair)?: A Little Help needed to walk in hospital room?: A Little Help needed climbing 3-5 steps with a railing? : A Little 6 Click Score: 19    End of Session Equipment Utilized During Treatment: Gait belt Activity Tolerance: Patient tolerated treatment well Patient left: in chair;with call bell/phone within reach;with family/visitor present (pt knows to call for assist, wife reports she will be with him and she will call for assist back to bed) Nurse Communication: Mobility status PT Visit Diagnosis: Other abnormalities of gait and mobility (R26.89)    Time: 0109-3235 PT Time Calculation (min) (ACUTE ONLY): 22 min   Charges:   PT Evaluation $PT Eval Low Complexity: 1 Low          Thania Woodlief, PT Acute Rehab [facility] [phone]   Karlton Lemon 01/31/2022, 5:13 PM

## 2022-02-01 DIAGNOSIS — N3000 Acute cystitis without hematuria: Secondary | ICD-10-CM | POA: Diagnosis not present

## 2022-02-01 LAB — CBC WITH DIFFERENTIAL/PLATELET
Abs Immature Granulocytes: 0.03 10*3/uL (ref 0.00–0.07)
Basophils Absolute: 0 10*3/uL (ref 0.0–0.1)
Basophils Relative: 0 %
Eosinophils Absolute: 0.1 10*3/uL (ref 0.0–0.5)
Eosinophils Relative: 1 %
HCT: 34.4 % — ABNORMAL LOW (ref 39.0–52.0)
Hemoglobin: 11.6 g/dL — ABNORMAL LOW (ref 13.0–17.0)
Immature Granulocytes: 0 %
Lymphocytes Relative: 9 %
Lymphs Abs: 0.6 10*3/uL — ABNORMAL LOW (ref 0.7–4.0)
MCH: 31.4 pg (ref 26.0–34.0)
MCHC: 33.7 g/dL (ref 30.0–36.0)
MCV: 93.2 fL (ref 80.0–100.0)
Monocytes Absolute: 1 10*3/uL (ref 0.1–1.0)
Monocytes Relative: 13 %
Neutro Abs: 5.5 10*3/uL (ref 1.7–7.7)
Neutrophils Relative %: 77 %
Platelets: 144 10*3/uL — ABNORMAL LOW (ref 150–400)
RBC: 3.69 MIL/uL — ABNORMAL LOW (ref 4.22–5.81)
RDW: 14.6 % (ref 11.5–15.5)
WBC: 7.2 10*3/uL (ref 4.0–10.5)
nRBC: 0 % (ref 0.0–0.2)

## 2022-02-01 LAB — MAGNESIUM: Magnesium: 2.1 mg/dL (ref 1.7–2.4)

## 2022-02-01 LAB — COMPREHENSIVE METABOLIC PANEL
ALT: 32 U/L (ref 0–44)
AST: 41 U/L (ref 15–41)
Albumin: 2.8 g/dL — ABNORMAL LOW (ref 3.5–5.0)
Alkaline Phosphatase: 79 U/L (ref 38–126)
Anion gap: 7 (ref 5–15)
BUN: 17 mg/dL (ref 8–23)
CO2: 23 mmol/L (ref 22–32)
Calcium: 7.8 mg/dL — ABNORMAL LOW (ref 8.9–10.3)
Chloride: 107 mmol/L (ref 98–111)
Creatinine, Ser: 0.84 mg/dL (ref 0.61–1.24)
GFR, Estimated: >60 mL/min (ref >60)
Glucose, Bld: 96 mg/dL (ref 70–99)
Potassium: 3.7 mmol/L (ref 3.5–5.1)
Sodium: 137 mmol/L (ref 135–145)
Total Bilirubin: 0.9 mg/dL (ref 0.3–1.2)
Total Protein: 5.5 g/dL — ABNORMAL LOW (ref 6.5–8.1)

## 2022-02-01 MED ORDER — GUAIFENESIN-DM 100-10 MG/5ML PO SYRP: 5.0000 mL | ORAL_SOLUTION | ORAL | 0 refills | Status: DC | PRN

## 2022-02-01 MED ORDER — AZITHROMYCIN 500 MG PO TABS: 500.0000 mg | ORAL_TABLET | Freq: Every day | ORAL | 0 refills | Status: AC

## 2022-02-01 MED ORDER — GUAIFENESIN ER 600 MG PO TB12: 600.0000 mg | ORAL_TABLET | Freq: Two times a day (BID) | ORAL | 0 refills | Status: AC

## 2022-02-01 MED ORDER — CEPHALEXIN 500 MG PO CAPS: 500.0000 mg | ORAL_CAPSULE | Freq: Four times a day (QID) | ORAL | 0 refills | Status: AC

## 2022-02-01 NOTE — TOC Initial Note (Signed)
Transition of Care [facility]) - Initial/Assessment Note    Patient Details  Name: Larry Lopez MRN: [id number] Date of Birth: [birth year]  Transition of Care [facility]) CM/SW Contact:    Vassie Moselle, LCSW Phone Number: 02/01/2022, 1:17 PM  Clinical Narrative:                 Met with pt and spouse in room. Pt's spouse shares she is supposed to be having surgery this week and will be unable to assist her husband once he gets home. Pt and spouse agreeable to home health services and share they have no had HH in the past. Pt's spouse also requesting tub bench for this pt. CSW shared that this would be private  pay as insurance will not cover it; pt agreeable to private pay.  HHPT/OT/Aide has been arranged with [facility]. Shower seat/tub bench has been ordered through [employer] and will be delivered  to pt's room.   Expected Discharge Plan: [place] Barriers to Discharge: No Barriers Identified   Patient Goals and CMS Choice Patient states their goals for this hospitalization and ongoing recovery are:: To return home CMS Medicare.gov Compare Post Acute Care list provided to:: Patient Choice offered to / list presented to : Patient      Expected Discharge Plan and Services In-house Referral: Clinical Social Work Discharge Planning Services: CM Consult Post Acute Care Choice: Home Health, Durable Medical Equipment Living arrangements for the past 2 months: [place]                 DME Arranged: Tub bench DME Agency: [employer] Date DME Agency Contacted: 02/01/22 Time DME Agency Contacted: [id number] Representative spoke with at DME Agency: Rica Koyanagi HH Arranged: PT, OT, Nurse's Aide [place] Agency: [place] Date [place]: 02/01/22 Time [place]: 16 Representative spoke with at [place]: [place] Arrangements/Services Living arrangements for the past 2 months: [place] Lives with::  Spouse Patient language and need for interpreter reviewed:: Yes Do you feel safe going back to the place where you live?: Yes      Need for Family Participation in Patient Care: No (Comment) Care giver support system in place?: No (comment) Current home services: DME Criminal Activity/Legal Involvement Pertinent to Current Situation/Hospitalization: No - Comment as needed  Activities of Daily Living Home Assistive Devices/Equipment: None ADL Screening (condition at time of admission) Patient's cognitive ability adequate to safely complete daily activities?: Yes Is the patient deaf or have difficulty hearing?: Yes Does the patient have difficulty seeing, even when wearing glasses/contacts?: No Does the patient have difficulty concentrating, remembering, or making decisions?: Yes Patient able to express need for assistance with ADLs?: Yes Does the patient have difficulty dressing or bathing?: Yes Independently performs ADLs?: No Communication: Independent Grooming: Needs assistance Is this a change from baseline?: Change from baseline, expected to last <3 days Feeding: Independent Bathing: Needs assistance Is this a change from baseline?: Change from baseline, expected to last >3 days Toileting: Needs assistance Is this a change from baseline?: Change from baseline, expected to last >3days In/Out Bed: Needs assistance Is this a change from baseline?: Change from baseline, expected to last >3 days Walks in Home: Needs assistance Is this a change from baseline?: Change from baseline, expected to last >3 days Does the patient have difficulty walking or climbing stairs?: Yes Weakness of Legs: Both Weakness of Arms/Hands: None  Permission Sought/Granted   Permission granted to  share information with : Yes, Verbal Permission Granted  Share Information with NAME: Strider Vallance     Permission granted to share info w Relationship: Spouse  Permission granted to share info w Contact  Information: [phone]  Emotional Assessment Appearance:: Appears stated age Attitude/Demeanor/Rapport: Engaged Affect (typically observed): Accepting Orientation: : Oriented to Self, Oriented to Place, Oriented to  Time, Oriented to Situation Alcohol / Substance Use: Not Applicable Psych Involvement: No (comment)  Admission diagnosis:  UTI (urinary tract infection) [N39.0] Acute UTI [N39.0] Acute cystitis without hematuria [N30.00] Atrial fibrillation with RVR ([place]) [I48.91] Community acquired pneumonia of left lower lobe of lung [J18.9] Patient Active Problem List   Diagnosis Date Noted   Chronic systolic CHF (congestive heart failure) ([place]) 01/31/2022   Paroxysmal atrial fibrillation with rapid ventricular response ([place]) 01/31/2022   BPH without obstruction/lower urinary tract symptoms 01/31/2022   Acute cystitis without hematuria 01/30/2022   Hypokalemia 01/30/2022   Pneumonia of left lower lobe due to infectious organism 01/30/2022   SIRS (systemic inflammatory response syndrome) ([place]) 01/30/2022   Generalized weakness 01/30/2022   UTI (urinary tract infection) 01/30/2022   Memory change 04/24/2020   Bilateral impacted cerumen 11/27/2018   Right inguinal hernia 09/05/2017   Urinary frequency 09/05/2017   Coronary artery disease involving native heart without angina pectoris 11/05/2013   Imbalance 11/05/2013   Anatomical narrow angle borderline glaucoma 01/18/2011   Primary open angle glaucoma 01/18/2011   Senile nuclear sclerosis 01/18/2011   Routine health maintenance 08/22/2010   Hearing loss 08/22/2009   ATRIAL FIBRILLATION 06/27/2008   Mixed hyperlipidemia 12/09/2006   Essential hypertension 12/09/2006   MYOCARDIAL INFARCTION, HX OF 12/09/2006   CAD (coronary artery disease) 12/09/2006   PCP:  Hoyt Koch, MD Pharmacy:   [facility] [place] [address] Phone:  [phone] Fax: [phone]  [facility] ORDER) [place], [address] [place] [address] Phone: [phone] Fax: [phone]  [facility] [place], [address] [facility] AT [facility] [place] [address] Phone: [phone] Fax: [phone]     Social Determinants of Health (SDOH) Social History: SDOH Screenings   Food Insecurity: No Food Insecurity (01/30/2022)  Housing: Low Risk  (01/30/2022)  Transportation Needs: No Transportation Needs (01/30/2022)  Utilities: Not At Risk (01/30/2022)  Depression (PHQ2-9): Low Risk  (09/03/2021)  Financial Resource Strain: Low Risk  (06/16/2020)  Physical Activity: Unknown (10/06/2018)  Social Connections: Socially Integrated (08/03/2017)  Stress: Stress Concern Present (08/03/2017)  Tobacco Use: Medium Risk (01/29/2022)   SDOH Interventions:     Readmission Risk Interventions    02/01/2022    1:15 PM  Readmission Risk Prevention Plan  Transportation Screening Complete  PCP or Specialist Appt within 5-7 Days Complete  Home Care Screening Complete  Medication Review (RN CM) Complete

## 2022-02-01 NOTE — Discharge Summary (Addendum)
Physician Discharge Summary  Larry Lopez SHF:[id number] DOB: 09-18-1926 DOA: 01/29/2022  PCP: Hoyt Koch, MD  Admit date: 01/29/2022 Discharge date: 02/01/2022 Recommendations for Outpatient Follow-up:  Follow up with PCP in 1 weeks-call for appointment Please obtain BMP/CBC in one week  Discharge Dispo: [facility]  facility Discharge Condition: Stable Code Status:   Code Status: DNR Diet recommendation:  Diet Order             Diet Heart Room service appropriate? Yes; Fluid consistency: Thin  Diet effective now                    Brief/Interim Summary: 60yom  w/ CAD  S/P CABG with re-do CABG 2002, paroxysmal atrial fibrillation (on Eliquis, S/P DC cadrdioversion 5027) complicated by tachybradycardia syndrome (no PPM, conservative mgmt), systolic congestive heart failure (Echo 07/2013 EF 40-45%),  hyperlipidemia, benign prostatic hyperplasia, hypertension  presented to ED due to family concerns for severe lethargy. IN ed:was found to exhibit multiple SIRS criteria including be febrile at 102.5 F.CXR-concerning for developing left lower lobe pneumonia.  Furthermore urinalysis appeared suggestive of urinary tract infection.   Patient was admitted, treated with ceftriaxone azithromycin.COVID-19/influenza/RSV PCR testing were performed and were found to be negative. Initial hospital course was complicated by rapid atrial fibrillation with heart rates in excess of 130 bpm.  With intravenous fluids and treatment of underlying infection patient spontaneously reverted back to normal sinus rhythm.  Patient was monitored 1 additional night to ensure no recurrence of A-fib.Remained hemodynamically stable.  Blood culture no growth to date, urine culture not done. Patient was treated for fever weakness due to lower lobe pneumonia possible UTI contributing.  At this time patient was medically stable afebrile overnight slept well, antibiotic will be changed to p.o. for him to discharge home  with wife.  Both of them are comfortable going to facility today.      Discharge Diagnoses:  Principal Problem:   Acute cystitis without hematuria Active Problems:   Pneumonia of left lower lobe due to infectious organism   Paroxysmal atrial fibrillation with rapid ventricular response (HCC)   Coronary artery disease involving native heart without angina pectoris   Chronic systolic CHF (congestive heart failure) (HCC)   Essential hypertension   Mixed hyperlipidemia   BPH without obstruction/lower urinary tract symptoms   ATRIAL FIBRILLATION   Primary open angle glaucoma   Hypokalemia   SIRS (systemic inflammatory response syndrome) (HCC)   Generalized weakness  Acute cystitis without hematuria: Culture not available but coverage felt to be adequate with current antibiotics discharged on p.o. Difficult say as to whether this is asymptomatic bacteriuria or true urinary tract infection, it is possible that a urinary tract infection is contributing to the patient's encephalopathy   Pneumonia of left lower lobe due to infectious organism: Clinically improved, doing well on room air.  Afebrile, workup unremarkable so far with blood culture strep pneumonia, Legionella pending.  Continue oral antibiotics to complete the course and follow-up with PCP  Acute metabolic encephalopathy on admission with lethargy: It has resolved with treatment and antibiotics.  Currently at baseline alert awake  PAF W/ XAJ:OINOM of rapid atrial fibrillation in the ED> at this time heart rate is well-controlled patient takes prn metoprolol at home, cont home Eliquis  CAD: No chest pain continue Eliquis. Chronic systolic CHF: Currently euvolemic no signs of exacerbation continue low-sodium diet Daily weight monitoring at home  Essential hypertension: Patient does not take any medication currently.  BP stable Mixed  hyperlipidemia: cont home meds BPH without obstruction/lower urinary tract symptoms:Continue home  regimen of tamsulosin  Wife will have surgery upcoming so soon Home health will be set up for discharge as per request given his age hospitalization deconditioning  Consults: none Subjective: Alert awake oriented resting comfortably eating well wife at the bedside he feels good about going back home today  Discharge Exam: Vitals:   02/01/22 0619 02/01/22 1326  BP: (!) 121/51 124/78  Pulse: 82 (!) 109  Resp: 20 18  Temp: 98.4 F (36.9 C) 98.3 F (36.8 C)  SpO2: 96% 97%   General: Pt is alert, awake, not in acute distress Cardiovascular: RRR, S1/S2 +, no rubs, no gallops Respiratory: CTA bilaterally, no wheezing, no rhonchi Abdominal: Soft, NT, ND, bowel sounds + Extremities: no edema, no cyanosis  Discharge Instructions  Discharge Instructions     Discharge instructions   Complete by: As directed    Follow-up with PCP in 1 week  Please call call MD or return to ER for similar or worsening recurring problem that brought you to hospital or if any fever,nausea/vomiting,abdominal pain, uncontrolled pain, chest pain,  shortness of breath or any other alarming symptoms.  Please follow-up your doctor as instructed in a week time and call the office for appointment.  Please avoid alcohol, smoking, or any other illicit substance and maintain healthy habits including taking your regular medications as prescribed.  You were cared for by a hospitalist during your hospital stay. If you have any questions about your discharge medications or the care you received while you were in the hospital after you are discharged, you can call the unit and ask to speak with the hospitalist on call if the hospitalist that took care of you is not available.  Once you are discharged, your primary care physician will handle any further medical issues. Please note that NO REFILLS for any discharge medications will be authorized once you are discharged, as it is imperative that you return to your primary  care physician (or establish a relationship with a primary care physician if you do not have one) for your aftercare needs so that they can reassess your need for medications and monitor your lab values   Face-to-face encounter (required for Medicare/Medicaid patients)   Complete by: As directed    I Antonieta Pert certify that this patient is under my care and that I, or a nurse practitioner or physician's assistant working with me, had a face-to-face encounter that meets the physician face-to-face encounter requirements with this patient on 02/01/2022. The encounter with the patient was in whole, or in part for the following medical condition(s) which is the primary reason for home health care (List medical condition): Deconditioning/UTI/pneumonia   The encounter with the patient was in whole, or in part, for the following medical condition, which is the primary reason for home health care: Deconditioning/UTI/pneumonia   I certify that, based on my findings, the following services are medically necessary home health services: Physical therapy   Reason for Medically Necessary Home Health Services: Therapy- Therapeutic Exercises to Increase Strength and Endurance   My clinical findings support the need for the above services: Unable to leave home safely without assistance and/or assistive device   Further, I certify that my clinical findings support that this patient is homebound due to: Unable to leave home safely without assistance   Home Health   Complete by: As directed    To provide the following care/treatments:  PT [place]  Increase activity slowly   Complete by: As directed       Allergies as of 02/01/2022   No Known Allergies      Medication List     TAKE these medications    atorvastatin 40 MG tablet Commonly known as: LIPITOR Take 1 tablet by mouth once daily   azithromycin 500 MG tablet Commonly known as: ZITHROMAX Take 1 tablet (500 mg total) by mouth daily  for 2 days.   calcium carbonate 1500 (600 Ca) MG Tabs tablet Commonly known as: OSCAL Take 1,500 mg by mouth daily.   cephALEXin 500 MG capsule Commonly known as: KEFLEX Take 1 capsule (500 mg total) by mouth 4 (four) times daily for 5 days.   CeraVe Crea Apply 1 Application topically daily as needed (dry , itchy scalp). Applied to scalp as needed   donepezil 10 MG tablet Commonly known as: ARICEPT Take 1 tablet (10 mg total) by mouth at bedtime.   Eliquis 2.5 MG Tabs tablet Generic drug: apixaban Take 1 tablet by mouth twice daily What changed: how much to take   famotidine 20 MG tablet Commonly known as: PEPCID Take 20 mg by mouth daily as needed for heartburn or indigestion. OTC   GINGER PO See admin instructions. Chew a small portion of ginger root as needed for indigestion   guaiFENesin 600 MG 12 hr tablet Commonly known as: MUCINEX Take 1 tablet (600 mg total) by mouth 2 (two) times daily for 7 days.   guaiFENesin-dextromethorphan 100-10 MG/5ML syrup Commonly known as: ROBITUSSIN DM Take 5 mLs by mouth every 4 (four) hours as needed for cough (chest congestion).   hydrochlorothiazide 25 MG tablet Commonly known as: HYDRODIURIL Take 1 tablet by mouth once daily   latanoprost 0.005 % ophthalmic solution Commonly known as: XALATAN Place 1 drop into both eyes at bedtime.   metoprolol tartrate 25 MG tablet Commonly known as: LOPRESSOR TAKE 1 TABLET BY MOUTH ONCE DAILY AS NEEDED. ONLY  USES  IF  NEEDED  FOR  TACHYCARDIA.  HAS  NOT  NEEDED  IN  OVER  A  YEAR What changed: See the new instructions.   Multi-Vitamin tablet Take 1 tablet by mouth daily.   nitroGLYCERIN 0.4 MG SL tablet Commonly known as: NITROSTAT Place 1 tablet (0.4 mg total) under the tongue every 5 (five) minutes as needed for chest pain.   SUPER B COMPLEX PO Take 1 tablet by mouth daily.   tamsulosin 0.4 MG Caps capsule Commonly known as: FLOMAX Take 1 capsule (0.4 mg total) by mouth  daily. What changed: when to take this   timolol 0.5 % ophthalmic solution Commonly known as: TIMOPTIC Place 1 drop into both eyes 2 (two) times daily.   triamcinolone cream 0.1 % Commonly known as: KENALOG APPLY  CREAM TOPICALLY TO AFFECTED AREA TWICE DAILY What changed: See the new instructions.               Durable Medical Equipment  (From admission, onward)           Start     Ordered   02/01/22 1110  For home use only DME Tub bench  Once        02/01/22 1110            Follow-up Information     [facility] Follow up.   Why: [facility] is to follow up with you at discharge to provide home health services.  No Known Allergies  The results of significant diagnostics from this hospitalization (including imaging, microbiology, ancillary and laboratory) are listed below for reference.    Microbiology: Recent Results (from the past 240 hour(s))  Blood Culture (routine x 2)     Status: None (Preliminary result)   Collection Time: 01/29/22  1:17 AM   Specimen: BLOOD  Result Value Ref Range Status   Specimen Description   Final    BLOOD BLOOD LEFT FOREARM Performed at [facility] [address], [place], [place] [address]    Special Requests   Final    BOTTLES DRAWN AEROBIC AND ANAEROBIC Blood Culture results may not be optimal due to an excessive volume of blood received in culture bottles Performed at [facility] [address], [place], [place] [address]    Culture   Final    NO GROWTH 2 DAYS Performed at [facility] [address], [place], [place] [address]    Report Status PENDING  Incomplete  Resp panel by RT-PCR (RSV, Flu A&B, Covid) Anterior Nasal Swab     Status: None   Collection Time: 01/29/22 11:32 PM   Specimen: Anterior Nasal Swab  Result Value Ref Range Status   SARS Coronavirus 2 by RT PCR NEGATIVE NEGATIVE Final    Comment: (NOTE) SARS-CoV-2  target nucleic acids are NOT DETECTED.  The SARS-CoV-2 RNA is generally detectable in upper respiratory specimens during the acute phase of infection. The lowest concentration of SARS-CoV-2 viral copies this assay can detect is 138 copies/mL. A negative result does not preclude SARS-Cov-2 infection and should not be used as the sole basis for treatment or other patient management decisions. A negative result may occur with  improper specimen collection/handling, submission of specimen other than nasopharyngeal swab, presence of viral mutation(s) within the areas targeted by this assay, and inadequate number of viral copies(<138 copies/mL). A negative result must be combined with clinical observations, patient history, and epidemiological information. The expected result is Negative.  Fact Sheet for Patients:  EntrepreneurPulse.com.au  Fact Sheet for Healthcare Providers:  IncredibleEmployment.be  This test is no t yet approved or cleared by the [place] FDA and  has been authorized for detection and/or diagnosis of SARS-CoV-2 by FDA under an Emergency Use Authorization (EUA). This EUA will remain  in effect (meaning this test can be used) for the duration of the COVID-19 declaration under Section 564(b)(1) of the Act, 21 U.S.C.section 360bbb-3(b)(1), unless the authorization is terminated  or revoked sooner.       Influenza A by PCR NEGATIVE NEGATIVE Final   Influenza B by PCR NEGATIVE NEGATIVE Final    Comment: (NOTE) The Xpert Xpress SARS-CoV-2/FLU/RSV plus assay is intended as an aid in the diagnosis of influenza from Nasopharyngeal swab specimens and should not be used as a sole basis for treatment. Nasal washings and aspirates are unacceptable for Xpert Xpress SARS-CoV-2/FLU/RSV testing.  Fact Sheet for Patients: EntrepreneurPulse.com.au  Fact Sheet for Healthcare  Providers: IncredibleEmployment.be  This test is not yet approved or cleared by the [place] FDA and has been authorized for detection and/or diagnosis of SARS-CoV-2 by FDA under an Emergency Use Authorization (EUA). This EUA will remain in effect (meaning this test can be used) for the duration of the COVID-19 declaration under Section 564(b)(1) of the Act, 21 U.S.C. section 360bbb-3(b)(1), unless the authorization is terminated or revoked.     Resp Syncytial Virus by PCR NEGATIVE NEGATIVE Final    Comment: (NOTE)  Fact Sheet for Patients: EntrepreneurPulse.com.au  Fact Sheet for Healthcare Providers: IncredibleEmployment.be  This test is not yet approved or cleared by the [place] FDA and has been authorized for detection and/or diagnosis of SARS-CoV-2 by FDA under an Emergency Use Authorization (EUA). This EUA will remain in effect (meaning this test can be used) for the duration of the COVID-19 declaration under Section 564(b)(1) of the Act, 21 U.S.C. section 360bbb-3(b)(1), unless the authorization is terminated or revoked.  Performed at [facility], [place] [address], [place], [place] [address]   Blood Culture (routine x 2)     Status: None (Preliminary result)   Collection Time: 01/29/22 11:45 PM   Specimen: BLOOD  Result Value Ref Range Status   Specimen Description   Final    BLOOD BLOOD LEFT FOREARM Performed at [facility] [address] [place], [place] [address]    Special Requests   Final    BOTTLES DRAWN AEROBIC AND ANAEROBIC Blood Culture adequate volume Performed at [facility] [address] [place], [place] [address]    Culture   Final    NO GROWTH 2 DAYS Performed at [facility] [address] [place], [place] [address]    Report Status PENDING  Incomplete    Procedures/Studies: DG Chest Port 1 View  Result Date:  01/30/2022 CLINICAL DATA:  Shortness of breath. EXAM: PORTABLE CHEST 1 VIEW COMPARISON:  01/28/2022. FINDINGS: The heart is enlarged and the mediastinal contour is stable. There is atherosclerotic calcification of the aorta. Mild airspace disease is present at the left lung base and incompletely visualized due to limited field of view. The right lung is clear. No definite effusion or pneumothorax. IMPRESSION: Mild airspace disease at the left lung base, possible atelectasis or infiltrate. Electronically Signed   By: Brett Fairy M.D.   On: 01/30/2022 04:20   DG Chest Port 1 View  Result Date: 01/29/2022 CLINICAL DATA:  Possible sepsis.  Fever and generalized weakness. EXAM: PORTABLE CHEST 1 VIEW COMPARISON:  08/04/2016. FINDINGS: Heart is enlarged and the mediastinal contour is within normal limits. There is atherosclerotic calcification of the aorta. Mild airspace disease is present at the left lung base. No effusion or pneumothorax. Sternotomy wires are present over the midline. No acute osseous abnormality. IMPRESSION: Mild airspace disease at the left lung base, possible developing infiltrate. Electronically Signed   By: Brett Fairy M.D.   On: 01/29/2022 23:46    Labs: BNP (last 3 results) No results for input(s): "BNP" in the last 8760 hours. Basic Metabolic Panel: Recent Labs  Lab 01/29/22 2345 01/30/22 1159 01/31/22 0751 02/01/22 0636  NA 133* 136 137 137  K 3.3* 3.6 3.7 3.7  CL 102 103 107 107  CO2 24 25 21* 23  GLUCOSE 146* 132* 101* 96  BUN '14 15 19 17  '$ CREATININE 0.94 0.73 0.90 0.84  CALCIUM 8.1* 8.0* 7.8* 7.8*  MG  --  1.9  --  2.1   Liver Function Tests: Recent Labs  Lab 01/29/22 2345 01/30/22 1159 01/31/22 0751 02/01/22 0636  AST 39 38 35 41  ALT '30 27 28 '$ 32  ALKPHOS 101 103 88 79  BILITOT 2.2* 1.5* 1.2 0.9  PROT 6.2* 6.6 5.8* 5.5*  ALBUMIN 3.3* 3.3* 2.9* 2.8*   No results for input(s): "LIPASE", "AMYLASE" in the last 168 hours. No results for input(s):  "AMMONIA" in the last 168 hours. CBC: Recent Labs  Lab 01/29/22 2345 01/31/22 0751 02/01/22  0636  WBC 9.3 9.5 7.2  NEUTROABS 7.4  --  5.5  HGB 11.8* 11.9* 11.6*  HCT 35.4* 35.5* 34.4*  MCV 93.4 92.7 93.2  PLT 141* 153 144*  Sepsis Labs Recent Labs  Lab 01/29/22 2345 01/31/22 0751 02/01/22 0636  WBC 9.3 9.5 7.2  Microbiology Recent Results (from the past 240 hour(s))  Blood Culture (routine x 2)     Status: None (Preliminary result)   Collection Time: 01/29/22  1:17 AM   Specimen: BLOOD  Result Value Ref Range Status   Specimen Description   Final    BLOOD BLOOD LEFT FOREARM Performed at [facility], [place] [address] [place] [address], [place], [place] [address]    Special Requests   Final    BOTTLES DRAWN AEROBIC AND ANAEROBIC Blood Culture results may not be optimal due to an excessive volume of blood received in culture bottles Performed at [facility] [address] [place], [place] [address]    Culture   Final    NO GROWTH 2 DAYS Performed at [facility] [address] [place], [place] [address]    Report Status PENDING  Incomplete  Resp panel by RT-PCR (RSV, Flu A&B, Covid) Anterior Nasal Swab     Status: None   Collection Time: 01/29/22 11:32 PM   Specimen: Anterior Nasal Swab  Result Value Ref Range Status   SARS Coronavirus 2 by RT PCR NEGATIVE NEGATIVE Final    Comment: (NOTE) SARS-CoV-2 target nucleic acids are NOT DETECTED.  The SARS-CoV-2 RNA is generally detectable in upper respiratory specimens during the acute phase of infection. The lowest concentration of SARS-CoV-2 viral copies this assay can detect is 138 copies/mL. A negative result does not preclude SARS-Cov-2 infection and should not be used as the sole basis for treatment or other patient management decisions. A negative result may occur with  improper specimen collection/handling, submission of specimen other than nasopharyngeal swab, presence of viral  mutation(s) within the areas targeted by this assay, and inadequate number of viral copies(<138 copies/mL). A negative result must be combined with clinical observations, patient history, and epidemiological information. The expected result is Negative.  Fact Sheet for Patients:  EntrepreneurPulse.com.au  Fact Sheet for Healthcare Providers:  IncredibleEmployment.be  This test is no t yet approved or cleared by the [place] FDA and  has been authorized for detection and/or diagnosis of SARS-CoV-2 by FDA under an Emergency Use Authorization (EUA). This EUA will remain  in effect (meaning this test can be used) for the duration of the COVID-19 declaration under Section 564(b)(1) of the Act, 21 U.S.C.section 360bbb-3(b)(1), unless the authorization is terminated  or revoked sooner.       Influenza A by PCR NEGATIVE NEGATIVE Final   Influenza B by PCR NEGATIVE NEGATIVE Final    Comment: (NOTE) The Xpert Xpress SARS-CoV-2/FLU/RSV plus assay is intended as an aid in the diagnosis of influenza from Nasopharyngeal swab specimens and should not be used as a sole basis for treatment. Nasal washings and aspirates are unacceptable for Xpert Xpress SARS-CoV-2/FLU/RSV testing.  Fact Sheet for Patients: EntrepreneurPulse.com.au  Fact Sheet for Healthcare Providers: IncredibleEmployment.be  This test is not yet approved or cleared by the [place] FDA and has been authorized for detection and/or diagnosis of SARS-CoV-2 by FDA under an Emergency Use Authorization (EUA). This EUA will remain in effect (meaning this test can be used) for the duration of the COVID-19 declaration under Section 564(b)(1) of the Act, 21 U.S.C. section  360bbb-3(b)(1), unless the authorization is terminated or revoked.     Resp Syncytial Virus by PCR NEGATIVE NEGATIVE Final    Comment: (NOTE) Fact Sheet for  Patients: EntrepreneurPulse.com.au  Fact Sheet for Healthcare Providers: IncredibleEmployment.be  This test is not yet approved or cleared by the [place] FDA and has been authorized for detection and/or diagnosis of SARS-CoV-2 by FDA under an Emergency Use Authorization (EUA). This EUA will remain in effect (meaning this test can be used) for the duration of the COVID-19 declaration under Section 564(b)(1) of the Act, 21 U.S.C. section 360bbb-3(b)(1), unless the authorization is terminated or revoked.  Performed at [facility], [place] [address] [place], [place] [address]   Blood Culture (routine x 2)     Status: None (Preliminary result)   Collection Time: 01/29/22 11:45 PM   Specimen: BLOOD  Result Value Ref Range Status   Specimen Description   Final    BLOOD BLOOD LEFT FOREARM Performed at [facility] [address] [place], [place] [address]    Special Requests   Final    BOTTLES DRAWN AEROBIC AND ANAEROBIC Blood Culture adequate volume Performed at [facility] [address], [place], [place] [address]    Culture   Final    NO GROWTH 2 DAYS Performed at [facility] [address], [place], [place] [address]    Report Status PENDING  Incomplete  Time coordinating discharge: 25 minutes  SIGNED: Antonieta Pert, MD  [facility] 02/01/2022, 1:44 PM  If 7PM-7AM, please contact night-coverage [email or web address]

## 2022-02-01 NOTE — Progress Notes (Signed)
small amounts of blood in urine noted by wife.

## 2022-02-01 NOTE — Evaluation (Addendum)
Occupational Therapy Evaluation Patient Details Name: Larry Lopez MRN: [id number] DOB: November 27, 1926 Today's Date: 02/01/2022   History of Present Illness Patient is 89 year old male admitted 01/29/21 with LLL PNA, SIRS,and UTI.  Pt with hx including but not limited to glaucoma, HTN, afib, CAD   Clinical Impression   Patient is a 89 year old male who was admitted for above. Patient was living at home independently with wife and no AD prior level. Currently, patient is min guard for transfers and mobility with HR noted to increase to 157bpm max during session with known history of a fib. Patient sustained 130s with ADL tasks. Patient was min guard with cues for completion of task for washing up at sink in room. Patient and wife were educated on recommendation for shower seat at home for ECT. Patient and wife verbalized understanding. Patient would continue to benefit from skilled OT services at this time while admitted and after d/c to address noted deficits in order to improve overall safety and independence in ADLs.       Recommendations for follow up therapy are one component of a multi-disciplinary discharge planning process, led by the attending physician.  Recommendations may be updated based on patient status, additional functional criteria and insurance authorization.   Follow Up Recommendations  Outpatient OT     Assistance Recommended at Discharge Frequent or constant Supervision/Assistance  Patient can return home with the following A little help with walking and/or transfers;Assistance with cooking/housework;Direct supervision/assist for medications management;Assist for transportation;Help with stairs or ramp for entrance;Direct supervision/assist for financial management;A little help with bathing/dressing/bathroom    Functional Status Assessment  Patient has had a recent decline in their functional status and demonstrates the ability to make significant improvements in function  in a reasonable and predictable amount of time.  Equipment Recommendations  Tub/shower seat       Precautions / Restrictions Precautions Precautions: Fall Precaution Comments: monitor HR, h/o A fib Restrictions Weight Bearing Restrictions: No      Mobility Bed Mobility Overal bed mobility: Needs Assistance Bed Mobility: Supine to Sit     Supine to sit: Supervision          Transfers Overall transfer level: Needs assistance   Transfers: Sit to/from Stand Sit to Stand: Min guard           General transfer comment: patient noted to have lag between sit and stand position with long pause mid standing but able to transition into standing withouth physical A to maintain balance.      Balance Overall balance assessment: Needs assistance Sitting-balance support: No upper extremity supported Sitting balance-Leahy Scale: Good     Standing balance support: No upper extremity supported, During functional activity Standing balance-Leahy Scale: Good         ADL either performed or assessed with clinical judgement   ADL Overall ADL's : Needs assistance/impaired Eating/Feeding: Modified independent Eating/Feeding Details (indicate cue type and reason): sitting in recliner Grooming: Wash/dry face;Wash/dry hands;Min guard;Set up;Standing Grooming Details (indicate cue type and reason): at sink in room Upper Body Bathing: Sitting;Set up;Supervision/ safety Upper Body Bathing Details (indicate cue type and reason): in recliner with wife providing cues on where to wash to patient. patients wife reported he is able to complete these things himself at home normally. patient and wife were educated on shower seat for ECT. patient and wife verbalized understanding. Lower Body Bathing: Min guard;Set up;Sitting/lateral leans Lower Body Bathing Details (indicate cue type and reason): with cues provided  from wife to ensure all areas were cleaned. Upper Body Dressing :  Supervision/safety;Set up;Sitting   Lower Body Dressing: Set up;Supervision/safety;Sit to/from stand;Sitting/lateral leans   Toilet Transfer: Min guard;Ambulation Toilet Transfer Details (indicate cue type and reason): with no AD with patient noted to have lagging moment midway between sit to stand. patient also noted to have moments of instability with mobility and reaching for furniture at times. no LOB and was abel to complete trip back without touching furniture when cued not to. Toileting- Clothing Manipulation and Hygiene: Supervision/safety;Sit to/from stand       Functional mobility during ADLs: Min guard  Wife reported surgery for her in the near future and inquired about assistance at home for both her and patient. Case manager sent secure chat of patients concerns. Case manager to speak with patient and wife today.       Pertinent Vitals/Pain Pain Assessment Pain Assessment: No/denies pain     Hand Dominance Right   Extremity/Trunk Assessment Upper Extremity Assessment Upper Extremity Assessment: Overall WFL for tasks assessed   Lower Extremity Assessment Lower Extremity Assessment: Defer to PT evaluation   Cervical / Trunk Assessment Cervical / Trunk Assessment: Kyphotic   Communication Communication Communication: HOH   Cognition Arousal/Alertness: Awake/alert Behavior During Therapy: WFL for tasks assessed/performed Overall Cognitive Status: Within Functional Limits for tasks assessed                     Home Living Family/patient expects to be discharged to:: Private residence Living Arrangements: Spouse/significant other Available Help at Discharge: Family;Available 24 hours/day Type of Home: House Home Access: Stairs to enter [employer] of Steps: 2 Entrance Stairs-Rails: None Home Layout: One level     Bathroom Shower/Tub: Occupational psychologist: Standard Bathroom Accessibility: Yes   Home Equipment: Cane - single  point          Prior Functioning/Environment Prior Level of Function : Independent/Modified Independent;Driving             Mobility Comments: could ambulate in community without AD; likes to lift weights and do exercises; no recent falls ADLs Comments: independent        OT Problem List: Decreased activity tolerance;Impaired balance (sitting and/or standing);Decreased coordination;Decreased knowledge of use of DME or AE;Cardiopulmonary status limiting activity      OT Treatment/Interventions: Therapeutic exercise;Self-care/ADL training;Energy conservation;Therapeutic activities;Patient/family education;Balance training    OT Goals(Current goals can be found in the care plan section) Acute Rehab OT Goals Patient Stated Goal: to get out of hospital OT Goal Formulation: With patient/family Time For Goal Achievement: 02/15/22 Potential to Achieve Goals: Fair  OT Frequency: Min 2X/week       AM-PAC OT "6 Clicks" Daily Activity     Outcome Measure Help from another person eating meals?: None Help from another person taking care of personal grooming?: A Little Help from another person toileting, which includes using toliet, bedpan, or urinal?: A Little Help from another person bathing (including washing, rinsing, drying)?: A Little Help from another person to put on and taking off regular upper body clothing?: A Little Help from another person to put on and taking off regular lower body clothing?: A Little 6 Click Score: 19   End of Session Nurse Communication: Mobility status;Other (comment) (HR during session)  Activity Tolerance: Patient tolerated treatment well Patient left: in chair;with call bell/phone within reach;with family/visitor present  OT Visit Diagnosis: Unsteadiness on feet (R26.81);Other abnormalities of gait and mobility (R26.89)  Time: 8833-7445 OT Time Calculation (min): 26 min Charges:  OT General Charges $OT Visit: 1 Visit OT  Evaluation $OT Eval Low Complexity: 1 Low OT Treatments $Self Care/Home Management : 8-22 mins  Rennie Plowman, MS Acute Rehabilitation Department Office# [phone]   Willa Rough 02/01/2022, 9:15 AM

## 2022-02-01 NOTE — Progress Notes (Signed)
Nutrition Brief Note  RD consulted for nutritional assessment.  Visited patient at bedside, wife present. Reports that pt is going home today. Pt not eating as much as he usually does. Pt states he thinks he will eat better when at home and eating his wife's cooking. No questions or needs from RD at this time.   Wt Readings from Last 15 Encounters:  11/19/21 71.4 kg  09/03/21 72.2 kg  02/03/21 72.1 kg  10/16/20 68.9 kg  04/22/20 69.6 kg  04/19/19 70.6 kg  12/06/18 68.3 kg  10/06/18 68.5 kg  08/07/18 68.1 kg  09/05/17 68.9 kg  08/03/17 67.6 kg  05/20/17 69.2 kg  11/25/16 67.5 kg  07/19/16 70.3 kg  07/19/16 70.5 kg    There is no height or weight on file to calculate BMI.   Current diet order is heart healthy, patient is consuming approximately 85% of meals at this time. Labs and medications reviewed.   No nutrition interventions warranted at this time. If nutrition issues arise, please re-consult RD.   Clayton Bibles, MS, RD, LDN Inpatient Clinical Dietitian Contact information available via Amion

## 2022-02-01 NOTE — Plan of Care (Signed)

## 2022-02-02 ENCOUNTER — Encounter: Payer: Self-pay | Admitting: *Deleted

## 2022-02-02 ENCOUNTER — Telehealth: Payer: Self-pay | Admitting: *Deleted

## 2022-02-02 LAB — LEGIONELLA PNEUMOPHILA SEROGP 1 UR AG: L. pneumophila Serogp 1 Ur Ag: NEGATIVE

## 2022-02-02 NOTE — Patient Outreach (Addendum)
Care Coordination [facility] Note Transition Care Management Follow-up Telephone Call Date of discharge and from where: Monday, 02/01/22 Larry Lopez; CAP- LLL; UTI; A-Fib How have you been since you were released from the hospital? Per patient and spouse while phone on speaker mode: "I am doing pretty well-- I feel great actually.  They took good care of me at the hospital.  I got all of the new medications and am taking them like I am supposed to and I am not having any trouble at all with my breathing-- like I said, I feel fine and much better than I did when I went to the hospital;" per spouse/ caregiver whom patient provides verbal consent to speak with today: "I am a bit concerned because he was so swollen up when he left the hospital, and today his legs and belly seem even more swollen to me.  He has not been weighing himself every day, but since you have advised this, we will start weighing him every day.  I am also a bit worried because I am having surgery next week to have my breast removed because I have cancer, and I know I won't be able to take care of him like I normally do.  We have good friends and family who will be helping [name], but I want these home care people to come in to help." Any questions or concerns? Yes 1) wife having surgery for mastectomy next week: will not be able to care for patient: reports friends/ family will be helping as allowed; provided education around initiation of home health services and provided phone number to [facility] home health; they report they will call [facility] now to obtain update on referral placed during hospitalization; I also provided my direct phone number and scheduled follow up telephone visit with RN CM Care Coordinator-- wife's surgery is currently planned for Thursday, February 11, 2022; she states she does not know at this time how long she will be in the hospital 2) weight gain- patient does not routinely monitor daily weights at home: provided education  around purpose of/ importance of daily weight monitoring/ recording in setting of fluid retention as earliest indicator of fluid retention at home; provided education around importance of following low salt diet 3) No hospital follow up PCP appointment scheduled: placed request with scheduling care guide to facilitate scheduling appointment around patient's wife upcoming scheduled surgery (wife provides transportation)  Items Reviewed: Did the pt receive and understand the discharge instructions provided? Yes  Medications obtained and verified? Yes  full medication review completed today; no concerns or discrepancies identified; wife manages all aspects of medication administration; they deny concerns around medications today Other? No  Any new allergies since your discharge? No  Dietary orders reviewed? Yes Do you have support at home? Yes   Home Care and Equipment/Supplies: Were home health services ordered? yes If so, what is the name of the agency? [facility]- provided phone number to patient [phone])  Has the agency set up a time to come to the patient's home? no Were any new equipment or medical supplies ordered?  No What is the name of the medical supply agency? N/A Were you able to get the supplies/equipment? not applicable Do you have any questions related to the use of the equipment or supplies? No N/A  Functional Questionnaire: (I = Independent and D = Dependent) ADLs: I  Bathing/Dressing- I  Meal Prep- D  Eating- I  Maintaining continence- I  Transferring/Ambulation- I  Managing Meds- D  Follow up appointments reviewed:  PCP Hospital f/u appt confirmed? No  Scheduled to see - on - @ [facility] f/u appt confirmed? No  Scheduled to see - on - @ - Are transportation arrangements needed? No  If their condition worsens, is the pt aware to call PCP or go to the Emergency Dept.? Yes Was the patient provided with contact information for the PCP's office or  ED? Yes Was to pt encouraged to call back with questions or concerns? Yes provided my direct phone number should concerns/ questions/ needs arise prior to scheduled RN CM Care Coordinator telephone visit on 02/09/22  SDOH assessments and interventions completed:   Yes SDOH Interventions Today    Flowsheet Row Most Recent Value  SDOH Interventions   Food Insecurity Interventions Intervention Not Indicated  Transportation Interventions Intervention Not Indicated  [spouse provides transportation]      Care Coordination Interventions:  PCP follow up appointment requested [facility] for Care Coordination Services:  RN Care Coordinator Provided education around self-health management of CHF/ fluid retention at home including daily weight monitoring/ recording and low salt diet; provided extensive education around home health services/ initiation of same    Encounter Outcome:  Pt. Visit Completed    Oneta Rack, RN, BSN, CCRN Alumnus RN CM Care Coordination/ Transition of [place] Management [phone]: direct office

## 2022-02-03 DIAGNOSIS — I110 Hypertensive heart disease with heart failure: Secondary | ICD-10-CM | POA: Diagnosis not present

## 2022-02-03 DIAGNOSIS — R001 Bradycardia, unspecified: Secondary | ICD-10-CM | POA: Diagnosis not present

## 2022-02-03 DIAGNOSIS — Z7901 Long term (current) use of anticoagulants: Secondary | ICD-10-CM | POA: Diagnosis not present

## 2022-02-03 DIAGNOSIS — Z87891 Personal history of nicotine dependence: Secondary | ICD-10-CM | POA: Diagnosis not present

## 2022-02-03 DIAGNOSIS — J188 Other pneumonia, unspecified organism: Secondary | ICD-10-CM | POA: Diagnosis not present

## 2022-02-03 DIAGNOSIS — I2510 Atherosclerotic heart disease of native coronary artery without angina pectoris: Secondary | ICD-10-CM | POA: Diagnosis not present

## 2022-02-03 DIAGNOSIS — G9341 Metabolic encephalopathy: Secondary | ICD-10-CM | POA: Diagnosis not present

## 2022-02-03 DIAGNOSIS — I480 Paroxysmal atrial fibrillation: Secondary | ICD-10-CM | POA: Diagnosis not present

## 2022-02-03 DIAGNOSIS — I5022 Chronic systolic (congestive) heart failure: Secondary | ICD-10-CM | POA: Diagnosis not present

## 2022-02-03 DIAGNOSIS — R6510 Systemic inflammatory response syndrome (SIRS) of non-infectious origin without acute organ dysfunction: Secondary | ICD-10-CM | POA: Diagnosis not present

## 2022-02-03 DIAGNOSIS — I495 Sick sinus syndrome: Secondary | ICD-10-CM | POA: Diagnosis not present

## 2022-02-03 DIAGNOSIS — E876 Hypokalemia: Secondary | ICD-10-CM | POA: Diagnosis not present

## 2022-02-04 ENCOUNTER — Telehealth: Payer: Self-pay | Admitting: Internal Medicine

## 2022-02-04 LAB — CULTURE, BLOOD (ROUTINE X 2)
Culture: NO GROWTH
Culture: NO GROWTH
Special Requests: ADEQUATE

## 2022-02-04 NOTE — Telephone Encounter (Signed)
Liji from [facility] called for verbal orders after the pt's evaluation.   Would like to do HHPT for: 1X for 1 week 2X for 3 weeks 1X3 for 3 weeks   To help improve strength, balance and gait.   Please call Liji to confirm: [phone]

## 2022-02-05 ENCOUNTER — Encounter: Payer: Self-pay | Admitting: Internal Medicine

## 2022-02-05 ENCOUNTER — Ambulatory Visit (INDEPENDENT_AMBULATORY_CARE_PROVIDER_SITE_OTHER): Payer: Medicare Other | Admitting: Internal Medicine

## 2022-02-05 VITALS — BP 100/60 | HR 122 | Temp 99.1°F | Ht 71.0 in | Wt 157.0 lb

## 2022-02-05 DIAGNOSIS — N3000 Acute cystitis without hematuria: Secondary | ICD-10-CM

## 2022-02-05 DIAGNOSIS — J189 Pneumonia, unspecified organism: Secondary | ICD-10-CM

## 2022-02-05 DIAGNOSIS — N400 Enlarged prostate without lower urinary tract symptoms: Secondary | ICD-10-CM

## 2022-02-05 NOTE — Assessment & Plan Note (Signed)
Improving and some cough left. Lungs sound improved on exam.

## 2022-02-05 NOTE — Assessment & Plan Note (Signed)
No current issues with obstruction

## 2022-02-05 NOTE — Progress Notes (Signed)
   Subjective:   Patient ID: Larry Lopez, male    DOB: Apr 27, 1926, 89 y.o.   MRN: [id number]  HPI The patient is a 88 YO man coming in for hospital follow up (in for pneumonia, discharge to rehab). He is still coughing. Denies shortness of breath. Still with poor energy and not as stable walking. Appetite is good and sleeping well.   Review of Systems  Constitutional:  Positive for activity change and fatigue.  HENT: Negative.    Eyes: Negative.   Respiratory:  Positive for cough. Negative for chest tightness and shortness of breath.   Cardiovascular:  Negative for chest pain, palpitations and leg swelling.  Gastrointestinal:  Negative for abdominal distention, abdominal pain, constipation, diarrhea, nausea and vomiting.  Musculoskeletal: Negative.   Skin: Negative.   Neurological: Negative.   Psychiatric/Behavioral: Negative.      Objective:  Physical Exam Constitutional:      Appearance: He is well-developed. He is ill-appearing.  HENT:     Head: Normocephalic and atraumatic.  Cardiovascular:     Rate and Rhythm: Normal rate and regular rhythm.  Pulmonary:     Effort: Pulmonary effort is normal. No respiratory distress.     Breath sounds: Normal breath sounds. No wheezing or rales.  Abdominal:     General: Bowel sounds are normal. There is no distension.     Palpations: Abdomen is soft.     Tenderness: There is no abdominal tenderness. There is no rebound.  Musculoskeletal:     Cervical back: Normal range of motion.  Skin:    General: Skin is warm and dry.  Neurological:     Mental Status: He is alert and oriented to person, place, and time.     Coordination: Coordination normal.     Vitals:   02/05/22 1332  BP: 100/60  Pulse: (!) 122  Temp: 99.1 F (37.3 C)  TempSrc: Oral  SpO2: 96%  Weight: 157 lb (71.2 kg)  Height: '5\' 11"'$  (1.803 m)    Assessment & Plan:  Visit time 25 minutes in face to face communication with patient and coordination of care,  additional 5 minutes spent in record review, coordination or care, ordering tests, communicating/referring to other healthcare professionals, documenting in medical records all on the same day of the visit for total time 30 minutes spent on the visit.

## 2022-02-05 NOTE — Assessment & Plan Note (Signed)
Overall resolving and encouraged to maintain hydration and drink plenty of fluids.

## 2022-02-05 NOTE — Telephone Encounter (Signed)
Ok for verbals 

## 2022-02-07 ENCOUNTER — Other Ambulatory Visit (HOSPITAL_COMMUNITY): Payer: Self-pay | Admitting: Internal Medicine

## 2022-02-08 DIAGNOSIS — I495 Sick sinus syndrome: Secondary | ICD-10-CM | POA: Diagnosis not present

## 2022-02-08 DIAGNOSIS — J188 Other pneumonia, unspecified organism: Secondary | ICD-10-CM | POA: Diagnosis not present

## 2022-02-08 DIAGNOSIS — I480 Paroxysmal atrial fibrillation: Secondary | ICD-10-CM | POA: Diagnosis not present

## 2022-02-08 DIAGNOSIS — I110 Hypertensive heart disease with heart failure: Secondary | ICD-10-CM | POA: Diagnosis not present

## 2022-02-08 DIAGNOSIS — I5022 Chronic systolic (congestive) heart failure: Secondary | ICD-10-CM | POA: Diagnosis not present

## 2022-02-08 DIAGNOSIS — R6510 Systemic inflammatory response syndrome (SIRS) of non-infectious origin without acute organ dysfunction: Secondary | ICD-10-CM | POA: Diagnosis not present

## 2022-02-08 NOTE — Telephone Encounter (Signed)
Notified Ligi w/ MD response.Marland KitchenJohny Lopez

## 2022-02-09 ENCOUNTER — Ambulatory Visit: Payer: Self-pay

## 2022-02-09 NOTE — Patient Outreach (Signed)
  Care Coordination   Initial Visit Note   02/09/2022 Name: DEXTER SIGNOR MRN: [id number] DOB: 1926/02/15  Gwendolyn Grant Laymon is a 89 y.o. year old male who sees Hoyt Koch, MD for primary care. I spoke with  Gwendolyn Grant Luby by phone today.  What matters to the patients health and wellness today?  RNCM called for follow up. Ms. Fredell answered the phone and states patient is asleep and expresses, Mr. Champine is doing better, weighing self and reports weight is going down.   SDOH assessments and interventions completed:  No  Care Coordination Interventions:  No, not indicated   Follow up plan: Follow up call scheduled for tomorrow    Encounter Outcome:  Pt. Request to Call Back   Thea Silversmith, RN, MSN, BSN, [place] Coordinator [phone]

## 2022-02-10 ENCOUNTER — Ambulatory Visit: Payer: Self-pay

## 2022-02-10 NOTE — Patient Instructions (Signed)
Visit Information  Thank you for taking time to visit with me today. Please don't hesitate to contact me if I can be of assistance to you.   Following are the goals we discussed today:   Goals Addressed             This Visit's Progress    Care Coordination Activities       Interventions Today    Flowsheet Row Most Recent Value  General Interventions   General Interventions Discussed/Reviewed General Interventions Discussed, Doctor Visits  Doctor Visits Discussed/Reviewed Doctor Visits Reviewed, PCP  PCP/Specialist Visits Compliance with follow-up visit  Pharmacy Interventions   Pharmacy Dicussed/Reviewed Affording Medications  Safety Interventions   Safety Discussed/Reviewed Safety Discussed               Our next appointment is by telephone on 02/23/22 at 09:45 am  Please call the care guide team at [phone] if you need to cancel or reschedule your appointment.   If you are experiencing a Mental Health or [place] or need someone to talk to, please call the Suicide and Crisis Lifeline: [place], RN, MSN, BSN, CCM Care Management Coordinator [employer] [phone]

## 2022-02-10 NOTE — Patient Outreach (Signed)
  Care Coordination   Follow Up Visit Note   02/10/2022 Name: Larry Lopez MRN: [id number] DOB: 1926-11-26  Larry Lopez is a 89 y.o. year old male who sees Hoyt Koch, MD for primary care. I spoke with  Larry Lopez by phone today.  What matters to the patients health and wellness today?  Admission  01/29/22-02/01/22. Larry Lopez reports he is doing well and "feeling good". Completed follow up visit with PCP on 02/05/22. He reports he has medications and is taking without any difficulty. He denies any questions or concerns at this time.   Goals Addressed             This Visit's Progress    Care Coordination Activities       Interventions Today    Flowsheet Row Most Recent Value  General Interventions   General Interventions Discussed/Reviewed General Interventions Discussed, Doctor Visits  Doctor Visits Discussed/Reviewed Doctor Visits Reviewed, PCP  PCP/Specialist Visits Compliance with follow-up visit  Pharmacy Interventions   Pharmacy Dicussed/Reviewed Affording Medications  Safety Interventions   Safety Discussed/Reviewed Safety Discussed             SDOH assessments and interventions completed:  Yesrecently completed January 2024. Patient denies any needs.   Care Coordination Interventions:  Yes, provided   Follow up plan: Follow up call scheduled for 02/23/22    Encounter Outcome:  Pt. Visit Completed   Thea Silversmith, RN, MSN, BSN, [place] Coordinator [phone]

## 2022-02-12 ENCOUNTER — Telehealth: Payer: Self-pay

## 2022-02-12 NOTE — Telephone Encounter (Signed)
FYI: OT eval needs to be delayed to next week due to staffing.

## 2022-02-15 DIAGNOSIS — R6510 Systemic inflammatory response syndrome (SIRS) of non-infectious origin without acute organ dysfunction: Secondary | ICD-10-CM | POA: Diagnosis not present

## 2022-02-15 DIAGNOSIS — I480 Paroxysmal atrial fibrillation: Secondary | ICD-10-CM | POA: Diagnosis not present

## 2022-02-15 DIAGNOSIS — J188 Other pneumonia, unspecified organism: Secondary | ICD-10-CM | POA: Diagnosis not present

## 2022-02-15 DIAGNOSIS — I495 Sick sinus syndrome: Secondary | ICD-10-CM | POA: Diagnosis not present

## 2022-02-15 DIAGNOSIS — I110 Hypertensive heart disease with heart failure: Secondary | ICD-10-CM | POA: Diagnosis not present

## 2022-02-15 DIAGNOSIS — I5022 Chronic systolic (congestive) heart failure: Secondary | ICD-10-CM | POA: Diagnosis not present

## 2022-02-19 DIAGNOSIS — R6510 Systemic inflammatory response syndrome (SIRS) of non-infectious origin without acute organ dysfunction: Secondary | ICD-10-CM | POA: Diagnosis not present

## 2022-02-19 DIAGNOSIS — I480 Paroxysmal atrial fibrillation: Secondary | ICD-10-CM | POA: Diagnosis not present

## 2022-02-19 DIAGNOSIS — I5022 Chronic systolic (congestive) heart failure: Secondary | ICD-10-CM | POA: Diagnosis not present

## 2022-02-19 DIAGNOSIS — I495 Sick sinus syndrome: Secondary | ICD-10-CM | POA: Diagnosis not present

## 2022-02-19 DIAGNOSIS — I110 Hypertensive heart disease with heart failure: Secondary | ICD-10-CM | POA: Diagnosis not present

## 2022-02-19 DIAGNOSIS — J188 Other pneumonia, unspecified organism: Secondary | ICD-10-CM | POA: Diagnosis not present

## 2022-02-23 ENCOUNTER — Ambulatory Visit: Payer: Self-pay

## 2022-02-23 NOTE — Patient Instructions (Addendum)
Visit Information  Thank you for taking time to visit with me today. Please don't hesitate to contact me if I can be of assistance to you.   Following are the goals we discussed today:   Goals Addressed             This Visit's Progress    COMPLETED: Care Coordination Activities       Interventions Today    Flowsheet Row Most Recent Value  Chronic Disease   Chronic disease during today's visit Other  [post hospitalization admission  01/29/22-02/01/22]  General Interventions   General Interventions Discussed/Reviewed General Interventions Reviewed  [encouraged patient to call RNCM or PCP if care coordination needs in the future]  Exercise Interventions   Exercise Discussed/Reviewed Exercise Discussed  [Patient still active with home health therapy]  Pharmacy Interventions   Pharmacy Dicussed/Reviewed Pharmacy Topics Reviewed  [patient reports he has medications and denies any questions or concerns]           If you are experiencing a Mental Health or [place] or need someone to talk to, please call the Suicide and Crisis Lifeline: [place], RN, MSN, BSN, [place] [phone]

## 2022-02-23 NOTE — Patient Outreach (Signed)
  Care Coordination   Follow Up Visit Note   02/23/2022 Name: Larry Lopez MRN: [id number] DOB: [birth year]  Larry Lopez is a 89 y.o. year old male who sees Larry Koch, MD for primary care. I spoke with  Larry Lopez and wife(dpr)by phone today.  What matters to the patients health and wellness today?  Larry Lopez reports he is doing well. He denies any questions or concerns. Larry Lopez also express patient is doing well. Patient and/or wife to contact RNCM if care coordination needs in the future.    Goals Addressed             This Visit's Progress    COMPLETED: Care Coordination Activities       Interventions Today    Flowsheet Row Most Recent Value  Chronic Disease   Chronic disease during today's visit Other  [post hospitalization admission  01/29/22-02/01/22. CAP, UTI,afib]  General Interventions   General Interventions Discussed/Reviewed General Interventions Reviewed  [encouraged patient to call RNCM or PCP if care coordination needs in the future]  Exercise Interventions   Exercise Discussed/Reviewed Exercise Discussed  [Patient still active with home health therapy]  Pharmacy Interventions   Pharmacy Dicussed/Reviewed Pharmacy Topics Reviewed  [patient reports he has medications and denies any questions or concerns]           SDOH assessments and interventions completed:  No  Care Coordination Interventions:  Yes, provided   Follow up plan: No further intervention required.   Encounter Outcome:  Pt. Visit Completed   Thea Silversmith, RN, MSN, BSN, [place] Coordinator [phone]

## 2022-02-24 DIAGNOSIS — J188 Other pneumonia, unspecified organism: Secondary | ICD-10-CM | POA: Diagnosis not present

## 2022-02-24 DIAGNOSIS — I110 Hypertensive heart disease with heart failure: Secondary | ICD-10-CM | POA: Diagnosis not present

## 2022-02-24 DIAGNOSIS — I5022 Chronic systolic (congestive) heart failure: Secondary | ICD-10-CM | POA: Diagnosis not present

## 2022-02-24 DIAGNOSIS — I480 Paroxysmal atrial fibrillation: Secondary | ICD-10-CM | POA: Diagnosis not present

## 2022-02-24 DIAGNOSIS — I495 Sick sinus syndrome: Secondary | ICD-10-CM | POA: Diagnosis not present

## 2022-02-24 DIAGNOSIS — R6510 Systemic inflammatory response syndrome (SIRS) of non-infectious origin without acute organ dysfunction: Secondary | ICD-10-CM | POA: Diagnosis not present

## 2022-02-25 DIAGNOSIS — J188 Other pneumonia, unspecified organism: Secondary | ICD-10-CM | POA: Diagnosis not present

## 2022-02-25 DIAGNOSIS — I495 Sick sinus syndrome: Secondary | ICD-10-CM | POA: Diagnosis not present

## 2022-02-25 DIAGNOSIS — I5022 Chronic systolic (congestive) heart failure: Secondary | ICD-10-CM | POA: Diagnosis not present

## 2022-02-25 DIAGNOSIS — I480 Paroxysmal atrial fibrillation: Secondary | ICD-10-CM | POA: Diagnosis not present

## 2022-02-25 DIAGNOSIS — I110 Hypertensive heart disease with heart failure: Secondary | ICD-10-CM | POA: Diagnosis not present

## 2022-02-25 DIAGNOSIS — R6510 Systemic inflammatory response syndrome (SIRS) of non-infectious origin without acute organ dysfunction: Secondary | ICD-10-CM | POA: Diagnosis not present

## 2022-03-01 DIAGNOSIS — G9341 Metabolic encephalopathy: Secondary | ICD-10-CM | POA: Diagnosis not present

## 2022-03-01 DIAGNOSIS — Z7901 Long term (current) use of anticoagulants: Secondary | ICD-10-CM | POA: Diagnosis not present

## 2022-03-01 DIAGNOSIS — J188 Other pneumonia, unspecified organism: Secondary | ICD-10-CM | POA: Diagnosis not present

## 2022-03-01 DIAGNOSIS — I110 Hypertensive heart disease with heart failure: Secondary | ICD-10-CM | POA: Diagnosis not present

## 2022-03-01 DIAGNOSIS — E876 Hypokalemia: Secondary | ICD-10-CM | POA: Diagnosis not present

## 2022-03-01 DIAGNOSIS — R6510 Systemic inflammatory response syndrome (SIRS) of non-infectious origin without acute organ dysfunction: Secondary | ICD-10-CM | POA: Diagnosis not present

## 2022-03-01 DIAGNOSIS — Z87891 Personal history of nicotine dependence: Secondary | ICD-10-CM | POA: Diagnosis not present

## 2022-03-01 DIAGNOSIS — I495 Sick sinus syndrome: Secondary | ICD-10-CM | POA: Diagnosis not present

## 2022-03-01 DIAGNOSIS — I5022 Chronic systolic (congestive) heart failure: Secondary | ICD-10-CM | POA: Diagnosis not present

## 2022-03-01 DIAGNOSIS — I2510 Atherosclerotic heart disease of native coronary artery without angina pectoris: Secondary | ICD-10-CM | POA: Diagnosis not present

## 2022-03-01 DIAGNOSIS — R001 Bradycardia, unspecified: Secondary | ICD-10-CM | POA: Diagnosis not present

## 2022-03-01 DIAGNOSIS — I480 Paroxysmal atrial fibrillation: Secondary | ICD-10-CM | POA: Diagnosis not present

## 2022-03-01 NOTE — Telephone Encounter (Signed)
Zacharia with [facility] calls today in regards to the occupational therapy. They are willing to start that this week with the patient but are requiring a verbal be put in place. This verbal can be given at   [facility]: [phone]  VM box is secure.

## 2022-03-02 DIAGNOSIS — I110 Hypertensive heart disease with heart failure: Secondary | ICD-10-CM | POA: Diagnosis not present

## 2022-03-02 DIAGNOSIS — J188 Other pneumonia, unspecified organism: Secondary | ICD-10-CM | POA: Diagnosis not present

## 2022-03-02 DIAGNOSIS — I480 Paroxysmal atrial fibrillation: Secondary | ICD-10-CM | POA: Diagnosis not present

## 2022-03-02 DIAGNOSIS — I5022 Chronic systolic (congestive) heart failure: Secondary | ICD-10-CM | POA: Diagnosis not present

## 2022-03-02 DIAGNOSIS — I495 Sick sinus syndrome: Secondary | ICD-10-CM | POA: Diagnosis not present

## 2022-03-02 DIAGNOSIS — R6510 Systemic inflammatory response syndrome (SIRS) of non-infectious origin without acute organ dysfunction: Secondary | ICD-10-CM | POA: Diagnosis not present

## 2022-03-02 NOTE — Telephone Encounter (Signed)
Ok for verbals 

## 2022-03-02 NOTE — Telephone Encounter (Signed)
Gave okay verbals for patient and Philis Nettle also mentioned that he does not require occupational therapy.

## 2022-03-03 ENCOUNTER — Other Ambulatory Visit: Payer: Self-pay | Admitting: Internal Medicine

## 2022-03-05 DIAGNOSIS — I5022 Chronic systolic (congestive) heart failure: Secondary | ICD-10-CM | POA: Diagnosis not present

## 2022-03-05 DIAGNOSIS — J188 Other pneumonia, unspecified organism: Secondary | ICD-10-CM | POA: Diagnosis not present

## 2022-03-05 DIAGNOSIS — R6510 Systemic inflammatory response syndrome (SIRS) of non-infectious origin without acute organ dysfunction: Secondary | ICD-10-CM | POA: Diagnosis not present

## 2022-03-05 DIAGNOSIS — R001 Bradycardia, unspecified: Secondary | ICD-10-CM | POA: Diagnosis not present

## 2022-03-05 DIAGNOSIS — E876 Hypokalemia: Secondary | ICD-10-CM | POA: Diagnosis not present

## 2022-03-05 DIAGNOSIS — G9341 Metabolic encephalopathy: Secondary | ICD-10-CM | POA: Diagnosis not present

## 2022-03-05 DIAGNOSIS — I110 Hypertensive heart disease with heart failure: Secondary | ICD-10-CM | POA: Diagnosis not present

## 2022-03-05 DIAGNOSIS — I495 Sick sinus syndrome: Secondary | ICD-10-CM | POA: Diagnosis not present

## 2022-03-05 DIAGNOSIS — Z87891 Personal history of nicotine dependence: Secondary | ICD-10-CM | POA: Diagnosis not present

## 2022-03-05 DIAGNOSIS — Z7901 Long term (current) use of anticoagulants: Secondary | ICD-10-CM | POA: Diagnosis not present

## 2022-03-05 DIAGNOSIS — I480 Paroxysmal atrial fibrillation: Secondary | ICD-10-CM | POA: Diagnosis not present

## 2022-03-05 DIAGNOSIS — I2510 Atherosclerotic heart disease of native coronary artery without angina pectoris: Secondary | ICD-10-CM | POA: Diagnosis not present

## 2022-03-12 ENCOUNTER — Telehealth: Payer: Self-pay | Admitting: Internal Medicine

## 2022-03-12 DIAGNOSIS — R6510 Systemic inflammatory response syndrome (SIRS) of non-infectious origin without acute organ dysfunction: Secondary | ICD-10-CM | POA: Diagnosis not present

## 2022-03-12 DIAGNOSIS — I110 Hypertensive heart disease with heart failure: Secondary | ICD-10-CM | POA: Diagnosis not present

## 2022-03-12 DIAGNOSIS — J188 Other pneumonia, unspecified organism: Secondary | ICD-10-CM | POA: Diagnosis not present

## 2022-03-12 DIAGNOSIS — I480 Paroxysmal atrial fibrillation: Secondary | ICD-10-CM | POA: Diagnosis not present

## 2022-03-12 DIAGNOSIS — I5022 Chronic systolic (congestive) heart failure: Secondary | ICD-10-CM | POA: Diagnosis not present

## 2022-03-12 DIAGNOSIS — I495 Sick sinus syndrome: Secondary | ICD-10-CM | POA: Diagnosis not present

## 2022-03-12 NOTE — Telephone Encounter (Signed)
Liji from [facility] states they are discharging the pt a week early at the pt's request.  For any additional info please call:  [phone]

## 2022-04-07 ENCOUNTER — Other Ambulatory Visit (HOSPITAL_COMMUNITY): Payer: Self-pay | Admitting: Internal Medicine

## 2022-05-14 DIAGNOSIS — H04123 Dry eye syndrome of bilateral lacrimal glands: Secondary | ICD-10-CM | POA: Diagnosis not present

## 2022-05-14 DIAGNOSIS — H401133 Primary open-angle glaucoma, bilateral, severe stage: Secondary | ICD-10-CM | POA: Diagnosis not present

## 2022-05-24 ENCOUNTER — Ambulatory Visit (INDEPENDENT_AMBULATORY_CARE_PROVIDER_SITE_OTHER): Payer: Medicare Other

## 2022-05-24 VITALS — BP 126/68 | HR 87 | Ht 71.0 in | Wt 153.0 lb

## 2022-05-24 DIAGNOSIS — Z0000 Encounter for general adult medical examination without abnormal findings: Secondary | ICD-10-CM | POA: Diagnosis not present

## 2022-05-24 NOTE — Patient Instructions (Signed)
It was great speaking with you today!  Please schedule your next Medicare Wellness Visit with your Nurse Health Advisor in 1 year by calling [phone]. 

## 2022-05-24 NOTE — Progress Notes (Signed)
Subjective:   Larry Lopez is a 89 y.o. male who presents for Medicare Annual/Subsequent preventive examination.  Review of Systems     No ROS. Medicare Wellness Visit. Additional risk factors are reflected in social history. Cardiac Risk Factors include: advanced age (>72men, >34 women);dyslipidemia;male gender;hypertension;sedentary lifestyle     Objective:    Today's Vitals   05/24/22 1045  BP: 126/68  Pulse: 87  SpO2: 99%  Weight: 153 lb (69.4 kg)  Height: 5\' 11"  (1.803 m)   Body mass index is 21.34 kg/m.     05/24/2022   11:11 AM 01/30/2022    6:21 PM 01/29/2022   11:03 PM 02/12/2021    8:53 AM 10/06/2018   10:48 AM 08/03/2017   11:41 AM 07/20/2016    3:25 AM  Advanced Directives  Does Patient Have a Medical Advance Directive? Yes  No Yes Yes Yes Yes  Type of Aeronautical engineer of [place];Living will Healthcare Power of [place];Living will Healthcare Power of [place];Living will Living will  Does patient want to make changes to medical advance directive? No - Patient declined      Yes (Inpatient - patient defers changing a medical advance directive at this time)  Copy of Healthcare Power of Attorney in Chart?    No - copy requested No - copy requested No - copy requested   Would patient like information on creating a medical advance directive?  No - Patient declined         Current Medications (verified) Outpatient Encounter Medications as of 05/24/2022  Medication Sig   atorvastatin (LIPITOR) 40 MG tablet Take 1 tablet by mouth once daily (Patient taking differently: Take 40 mg by mouth daily.)   calcium carbonate (OSCAL) 1500 (600 Ca) MG TABS tablet Take 1,500 mg by mouth daily.   ELIQUIS 2.5 MG TABS tablet Take 1 tablet by mouth twice daily (Patient taking differently: Take 2.5 mg by mouth 2 (two) times daily.)   hydrochlorothiazide (HYDRODIURIL) 25 MG tablet Take 1 tablet by mouth once daily   metoprolol tartrate (LOPRESSOR) 25 MG tablet TAKE  1 TABLET BY MOUTH ONCE DAILY AS NEEDED. ONLY  USES  IF  NEEDED  FOR  TACHYCARDIA.  HAS  NOT  NEEDED  IN  OVER  A  YEAR   Multiple Vitamin (MULTI-VITAMIN) tablet Take 1 tablet by mouth daily.   tamsulosin (FLOMAX) 0.4 MG CAPS capsule Take 1 capsule by mouth once daily   B Complex-C (SUPER B COMPLEX PO) Take 1 tablet by mouth daily. (Patient not taking: Reported on 05/24/2022)   donepezil (ARICEPT) 10 MG tablet Take 1 tablet (10 mg total) by mouth at bedtime. (Patient not taking: Reported on 05/24/2022)   Emollient (CERAVE) CREA Apply 1 Application topically daily as needed (dry , itchy scalp). Applied to scalp as needed (Patient not taking: Reported on 05/24/2022)   famotidine (PEPCID) 20 MG tablet Take 20 mg by mouth daily as needed for heartburn or indigestion. OTC (Patient not taking: Reported on 05/24/2022)   Ginger, Zingiber officinalis, (GINGER PO) See admin instructions. Chew a small portion of ginger root as needed for indigestion (Patient not taking: Reported on 05/24/2022)   latanoprost (XALATAN) 0.005 % ophthalmic solution Place 1 drop into both eyes at bedtime. (Patient not taking: Reported on 05/24/2022)   nitroGLYCERIN (NITROSTAT) 0.4 MG SL tablet PLACE 1 TABLET UNDER THE TONGUE EVERY 5 MINUTES AS NEEDED FOR CHEST PAIN (Patient not taking: Reported on 05/24/2022)   timolol (TIMOPTIC)  0.5 % ophthalmic solution Place 1 drop into both eyes 2 (two) times daily. (Patient not taking: Reported on 05/24/2022)   triamcinolone cream (KENALOG) 0.1 % APPLY  CREAM TOPICALLY TO AFFECTED AREA TWICE DAILY (Patient not taking: Reported on 05/24/2022)   [DISCONTINUED] guaiFENesin-dextromethorphan (ROBITUSSIN DM) 100-10 MG/5ML syrup Take 5 mLs by mouth every 4 (four) hours as needed for cough (chest congestion). (Patient not taking: Reported on 05/24/2022)   No facility-administered encounter medications on file as of 05/24/2022.    Allergies (verified) Patient has no known allergies.   History: Past Medical  History:  Diagnosis Date   Atrial fibrillation (HCC)    Bradycardia    CAD (coronary artery disease)    Glaucoma    HLD (hyperlipidemia)    HTN (hypertension)    Tachy-brady syndrome (HCC)    Past Surgical History:  Procedure Laterality Date   CARDIAC SURGERY     CARDIOVERSION N/A 02/06/2013   Procedure: CARDIOVERSION;  Surgeon: Dolores Patty, MD;  [facility]COPY;  Service: Cardiovascular;  Laterality: N/A;   CORONARY ARTERY BYPASS GRAFT     TONSILLECTOMY AND ADENOIDECTOMY     Family History  Problem Relation Age of Onset   Cancer Father        Deceased, 53   Breast cancer Mother        Deceased, 63   Breast cancer Sister    Colon cancer Daughter        Living   Diabetes Neg Hx    Coronary artery disease Neg Hx    Social History   Socioeconomic History   Marital status: Married    Spouse name: Not on file   Number of children: 2   Years of education: Not on file   Highest education level: Not on file  Occupational History   Occupation: accountant/ Retired  Tobacco Use   Smoking status: Former    Types: Cigarettes    Quit date: 01/12/1967    Years since quitting: 55.4   Smokeless tobacco: Never  Vaping Use   Vaping Use: Never used  Substance and Sexual Activity   Alcohol use: Yes    Alcohol/week: 28.0 standard drinks of alcohol    Types: 28 Standard drinks or equivalent per week    Comment: .5 bottle of wine daily   Drug use: No   Sexual activity: Yes  Other Topics Concern   Not on file  Social History Narrative   HSG, [employer]. 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 Has weathered the recession OK with some adjustments. Advanced Care planning - patient does not want cardiac resuscitation - provided a signed "out of facility" order for home display (Aug '12) and did review the MOST form, providing  an unsigned copy to be used for home discussion with report back.          Social Determinants of Health   Financial Resource Strain: Low Risk  (05/24/2022)   Overall Financial Resource Strain (CARDIA)    Difficulty of Paying Living Expenses: Not hard at all  Food Insecurity: No Food Insecurity (05/24/2022)   Hunger Vital Sign    Worried About Running Out of Food in the Last Year: Never true    Ran Out of Food in the Last Year: Never true  Transportation Needs: No Transportation Needs (05/24/2022)   PRAPARE - Transportation    Lack  of Transportation (Medical): No    Lack of Transportation (Non-Medical): No  Physical Activity: Insufficiently Active (05/24/2022)   Exercise Vital Sign    Days of Exercise per Week: 2 days    Minutes of Exercise per Session: 10 min  Stress: No Stress Concern Present (05/24/2022)   [employer] of Occupational Health - Occupational Stress Questionnaire    Feeling of Stress : Only a little  Social Connections: Socially Integrated (05/24/2022)   Social Connection and Isolation Panel [NHANES]    Frequency of Communication with Friends and Family: More than three times a week    Frequency of Social Gatherings with Friends and Family: Once a week    Attends Religious Services: More than 4 times per year    Active Member of [employer] or Organizations: Yes    Attends Engineer, structural: More than 4 times per year    Marital Status: Married    Tobacco Counseling Counseling given: Not Answered   Clinical Intake:  Pre-visit preparation completed: Yes  Pain : No/denies pain     BMI - recorded: 21 Nutritional Status: BMI of 19-24  Normal Nutritional Risks: None Diabetes: No  How often do you need to have someone help you when you read instructions, pamphlets, or other written materials from your doctor or pharmacy?: 1 - Never What is the last grade level you completed in school?: college  Interpreter Needed?: No  Information entered by ::  Elyse Jarvis, CMA   Activities of Daily Living    05/24/2022   11:11 AM 01/30/2022    6:00 PM  In your present state of health, do you have any difficulty performing the following activities:  Hearing? 1 1  Vision? 0 0  Difficulty concentrating or making decisions? 1 1  Comment memory getting worse   Walking or climbing stairs? 1 1  Comment some difficulty raising legs up   Dressing or bathing? 0 1  Doing errands, shopping? 0 0  Preparing Food and eating ? N   Using the Toilet? N   In the past six months, have you accidently leaked urine? Y   Do you have problems with loss of bowel control? N   Managing your Medications? N   Managing your Finances? N   Housekeeping or managing your Housekeeping? N     Patient Care Team: Myrlene Broker, MD as PCP - General (Internal Medicine) Bensimhon, Bevelyn Buckles, MD (Cardiology) Mckinley Jewel, MD (Ophthalmology) Glendale Chard, DO as Consulting Physician (Neurology) Szabat, Vinnie Level, [facility] (Inactive) as Pharmacist (Pharmacist)  Indicate any recent Medical Services you may have received from other than [facility] providers in the past year (date may be approximate).     Assessment:   This is a routine wellness examination for [place].  Hearing/Vision screen Patient has hearing difficulty and wears hearing aids. Patient does wear corrective lenses.   Dietary issues and exercise activities discussed: Current Exercise Habits: Home exercise routine, Type of exercise: strength training/weights, Time (Minutes): 15, Frequency (Times/Week): 2, Weekly Exercise (Minutes/Week): 30, Intensity: Mild, Exercise limited by: None identified   Goals Addressed             This Visit's Progress    Patient Stated       I would like to work on getting my balance better and working on strengthening my legs.       Depression Screen    05/24/2022   11:10 AM 02/05/2022    1:44 PM 09/03/2021    8:36  AM 04/22/2020    2:35 PM 10/06/2018   10:49 AM  08/03/2017   11:42 AM 07/19/2016   11:00 AM  PHQ 2/9 Scores  PHQ - 2 Score 0 0 0 0 1 1 0  PHQ- 9 Score     3 3     Fall Risk    05/24/2022   11:11 AM 02/10/2022    3:08 PM 02/05/2022    1:44 PM 09/03/2021    8:36 AM 04/22/2020    2:35 PM  Fall Risk   Falls in the past year? 1 0 0 0 0  Number falls in past yr: 0  0 0 0  Injury with Fall? 1  0 0 0  Risk for fall due to : Impaired mobility;Impaired balance/gait  No Fall Risks No Fall Risks   Follow up Falls evaluation completed  Falls evaluation completed Falls evaluation completed     FALL RISK PREVENTION PERTAINING TO THE HOME:  Any stairs in or around the home? Yes  If so, are there any without handrails? No  Home free of loose throw rugs in walkways, pet beds, electrical cords, etc? Yes  Adequate lighting in your home to reduce risk of falls? Yes   ASSISTIVE DEVICES UTILIZED TO PREVENT FALLS:  Life alert? No  Use of a cane, walker or w/c? No  Grab bars in the bathroom? Yes  Shower chair or bench in shower? No  Elevated toilet seat or a handicapped toilet? No   TIMED UP AND GO:  Was the test performed? No .  Length of time to ambulate 10 feet: N/A sec.   Patient is having troubles lifting feet up when walking, shuffling feet. Patient reports he is having some troubles with balance which started a few months ago.  Cognitive Function: Patient is having memory issues, would like to speak with Dr. Okey Dupre regarding medications. Advised pt and wife to make an appointment.     08/03/2017   10:26 AM  MMSE - Mini Mental State Exam  Orientation to time 5  Orientation to Place 5  Registration 3  Attention/ Calculation 5  Recall 2  Language- name 2 objects 2  Language- repeat 1  Language- follow 3 step command 3  Language- read & follow direction 1  Write a sentence 1  Copy design 1  Total score 29        05/24/2022   11:13 AM 10/06/2018   10:51 AM  6CIT Screen  What Year? 0 points 0 points  What month? 3 points 0  points  What time? 0 points 0 points  Count back from 20 0 points 0 points  Months in reverse 2 points 0 points  Repeat phrase 4 points 2 points  Total Score 9 points 2 points    Immunizations Immunization History  Administered Date(s) Administered   H1N1 12/25/2007   Influenza Split 09/12/2011   Influenza Whole 10/23/2008, 12/13/2013   Influenza, High Dose Seasonal PF 09/30/2017, 09/08/2018, 09/21/2018   Influenza-Unspecified 11/11/2012, 10/26/2014, 09/22/2020   PFIZER(Purple Top)SARS-COV-2 Vaccination 01/31/2019, 02/21/2019, 09/13/2019, 06/23/2020   Pneumococcal Conjugate-13 10/23/2012   Pneumococcal Polysaccharide-23 05/13/2003   Tdap 08/19/2010   Zoster Recombinat (Shingrix) 09/30/2017, 01/05/2018    TDAP status: Due, Education has been provided regarding the importance of this vaccine. Advised may receive this vaccine at local pharmacy or Health Dept. Aware to provide a copy of the vaccination record if obtained from local pharmacy or Health Dept. Verbalized acceptance and understanding.  Flu Vaccine status: Declined,  Education has been provided regarding the importance of this vaccine but patient still declined. Advised may receive this vaccine at local pharmacy or Health Dept. Aware to provide a copy of the vaccination record if obtained from local pharmacy or Health Dept. Verbalized acceptance and understanding.  Pneumococcal vaccine status: Up to date  Covid-19 vaccine status: Completed vaccines  Qualifies for Shingles Vaccine? Yes   Zostavax completed No   Shingrix Completed?: Yes  Screening Tests Health Maintenance  Topic Date Due   DTaP/Tdap/Td (2 - Td or Tdap) 08/18/2020   COVID-19 Vaccine (5 - 2023-24 season) 06/09/2022 (Originally 09/11/2021)   INFLUENZA VACCINE  08/12/2022   Medicare Annual Wellness (AWV)  05/24/2023   Pneumonia Vaccine 23+ Years old  Completed   Zoster Vaccines- Shingrix  Completed   HPV VACCINES  Aged Out    Health Maintenance  Health  Maintenance Due  Topic Date Due   DTaP/Tdap/Td (2 - Td or Tdap) 08/18/2020    Colorectal cancer screening: No longer required.   Lung Cancer Screening: (Low Dose CT Chest recommended if Age 66-80 years, 30 pack-year currently smoking OR have quit w/in 15years.) does not qualify.   Lung Cancer Screening Referral: N/A  Additional Screening:  Hepatitis C Screening: does not qualify; Completed N/A  Vision Screening: Recommended annual ophthalmology exams for early detection of glaucoma and other disorders of the eye. Is the patient up to date with their annual eye exam?  Yes  Who is the provider or what is the name of the office in which the patient attends annual eye exams? Dr. Burgess Estelle If pt is not established with a provider, would they like to be referred to a provider to establish care? No .   Dental Screening: Recommended annual dental exams for proper oral hygiene  Community Resource Referral / Chronic Care Management: CRR required this visit?  No   CCM required this visit?  No      Plan:     I have personally reviewed and noted the following in the patient's chart:   Medical and social history Use of alcohol, tobacco or illicit drugs  Current medications and supplements including opioid prescriptions. Patient is not currently taking opioid prescriptions. Functional ability and status Nutritional status Physical activity Advanced directives List of other physicians Hospitalizations, surgeries, and ER visits in previous 12 months Vitals Screenings to include cognitive, depression, and falls Referrals and appointments  In addition, I have reviewed and discussed with patient certain preventive protocols, quality metrics, and best practice recommendations. A written personalized care plan for preventive services as well as general preventive health recommendations were provided to patient.     Marinus Maw, CMA   05/24/2022   Nurse Notes: Patient has been having  urinary problems for the last month, wetting the floor all the way to the bathroom.  Also having memory issues, forgetting and placing things in wrong places. Not currently taking donepezil, wants to discuss with Dr. Okey Dupre regarding Rx. Advised I can make appointment but wife needed to be with her calendar, they will call the office.

## 2022-06-20 ENCOUNTER — Other Ambulatory Visit (HOSPITAL_COMMUNITY): Payer: Self-pay | Admitting: Internal Medicine

## 2022-07-22 ENCOUNTER — Other Ambulatory Visit (HOSPITAL_COMMUNITY): Payer: Self-pay | Admitting: Internal Medicine

## 2022-08-12 ENCOUNTER — Other Ambulatory Visit: Payer: Self-pay | Admitting: Internal Medicine

## 2022-09-17 ENCOUNTER — Other Ambulatory Visit (HOSPITAL_COMMUNITY): Payer: Self-pay | Admitting: Internal Medicine

## 2022-09-22 DIAGNOSIS — C44329 Squamous cell carcinoma of skin of other parts of face: Secondary | ICD-10-CM | POA: Diagnosis not present

## 2022-09-22 DIAGNOSIS — C44311 Basal cell carcinoma of skin of nose: Secondary | ICD-10-CM | POA: Diagnosis not present

## 2022-09-22 DIAGNOSIS — Z85828 Personal history of other malignant neoplasm of skin: Secondary | ICD-10-CM | POA: Diagnosis not present

## 2022-09-22 DIAGNOSIS — D485 Neoplasm of uncertain behavior of skin: Secondary | ICD-10-CM | POA: Diagnosis not present

## 2022-09-22 DIAGNOSIS — L821 Other seborrheic keratosis: Secondary | ICD-10-CM | POA: Diagnosis not present

## 2022-09-22 DIAGNOSIS — L570 Actinic keratosis: Secondary | ICD-10-CM | POA: Diagnosis not present

## 2022-09-27 ENCOUNTER — Ambulatory Visit (INDEPENDENT_AMBULATORY_CARE_PROVIDER_SITE_OTHER): Payer: Medicare Other

## 2022-09-27 DIAGNOSIS — Z23 Encounter for immunization: Secondary | ICD-10-CM | POA: Diagnosis not present

## 2022-09-27 NOTE — Progress Notes (Signed)
HD FLU Vaccine was administered today in the patients Left Deltoid. Patient tolerated his injection well and the injection site looked fine. Patient was advised to return to the office if he noticed any adverse reactions.

## 2022-10-13 ENCOUNTER — Other Ambulatory Visit: Payer: Self-pay | Admitting: Internal Medicine

## 2022-10-28 DIAGNOSIS — C44329 Squamous cell carcinoma of skin of other parts of face: Secondary | ICD-10-CM | POA: Diagnosis not present

## 2022-10-28 DIAGNOSIS — Z85828 Personal history of other malignant neoplasm of skin: Secondary | ICD-10-CM | POA: Diagnosis not present

## 2022-10-28 DIAGNOSIS — C44311 Basal cell carcinoma of skin of nose: Secondary | ICD-10-CM | POA: Diagnosis not present

## 2022-11-10 ENCOUNTER — Emergency Department (HOSPITAL_COMMUNITY)
Admission: EM | Admit: 2022-11-10 | Discharge: 2022-11-10 | Disposition: A | Discharge status: Home / Self Care | Payer: Medicare Other | Attending: Emergency Medicine | Admitting: Emergency Medicine

## 2022-11-10 ENCOUNTER — Emergency Department (HOSPITAL_COMMUNITY): Payer: Medicare Other

## 2022-11-10 ENCOUNTER — Other Ambulatory Visit: Payer: Self-pay

## 2022-11-10 ENCOUNTER — Encounter (HOSPITAL_COMMUNITY): Payer: Self-pay | Admitting: Emergency Medicine

## 2022-11-10 DIAGNOSIS — D494 Neoplasm of unspecified behavior of bladder: Secondary | ICD-10-CM | POA: Diagnosis not present

## 2022-11-10 DIAGNOSIS — Z79899 Other long term (current) drug therapy: Secondary | ICD-10-CM | POA: Diagnosis not present

## 2022-11-10 DIAGNOSIS — Z7901 Long term (current) use of anticoagulants: Secondary | ICD-10-CM | POA: Insufficient documentation

## 2022-11-10 DIAGNOSIS — I10 Essential (primary) hypertension: Secondary | ICD-10-CM | POA: Insufficient documentation

## 2022-11-10 DIAGNOSIS — K5730 Diverticulosis of large intestine without perforation or abscess without bleeding: Secondary | ICD-10-CM | POA: Diagnosis not present

## 2022-11-10 DIAGNOSIS — K4090 Unilateral inguinal hernia, without obstruction or gangrene, not specified as recurrent: Secondary | ICD-10-CM | POA: Diagnosis not present

## 2022-11-10 DIAGNOSIS — K8020 Calculus of gallbladder without cholecystitis without obstruction: Secondary | ICD-10-CM | POA: Diagnosis not present

## 2022-11-10 DIAGNOSIS — I2510 Atherosclerotic heart disease of native coronary artery without angina pectoris: Secondary | ICD-10-CM | POA: Diagnosis not present

## 2022-11-10 DIAGNOSIS — R310 Gross hematuria: Secondary | ICD-10-CM | POA: Insufficient documentation

## 2022-11-10 DIAGNOSIS — R319 Hematuria, unspecified: Secondary | ICD-10-CM | POA: Diagnosis present

## 2022-11-10 DIAGNOSIS — N3289 Other specified disorders of bladder: Secondary | ICD-10-CM

## 2022-11-10 LAB — CBC
HCT: 42.3 % (ref 39.0–52.0)
Hemoglobin: 13.6 g/dL (ref 13.0–17.0)
MCH: 30.4 pg (ref 26.0–34.0)
MCHC: 32.2 g/dL (ref 30.0–36.0)
MCV: 94.6 fL (ref 80.0–100.0)
Platelets: 180 10*3/uL (ref 150–400)
RBC: 4.47 MIL/uL (ref 4.22–5.81)
RDW: 14.9 % (ref 11.5–15.5)
WBC: 5.1 10*3/uL (ref 4.0–10.5)
nRBC: 0 % (ref 0.0–0.2)

## 2022-11-10 LAB — URINALYSIS, ROUTINE W REFLEX MICROSCOPIC
Bacteria, UA: NONE SEEN
Bilirubin Urine: NEGATIVE
Glucose, UA: NEGATIVE mg/dL
Ketones, ur: NEGATIVE mg/dL
Leukocytes,Ua: NEGATIVE
Nitrite: NEGATIVE
Protein, ur: NEGATIVE mg/dL
RBC / HPF: >50 RBC/hpf (ref 0–5)
Specific Gravity, Urine: 1.006 (ref 1.005–1.030)
pH: 6 (ref 5.0–8.0)

## 2022-11-10 LAB — BASIC METABOLIC PANEL
Anion gap: 7 (ref 5–15)
BUN: 14 mg/dL (ref 8–23)
CO2: 27 mmol/L (ref 22–32)
Calcium: 9.2 mg/dL (ref 8.9–10.3)
Chloride: 107 mmol/L (ref 98–111)
Creatinine, Ser: 0.94 mg/dL (ref 0.61–1.24)
GFR, Estimated: >60 mL/min (ref >60)
Glucose, Bld: 119 mg/dL — ABNORMAL HIGH (ref 70–99)
Potassium: 4 mmol/L (ref 3.5–5.1)
Sodium: 141 mmol/L (ref 135–145)

## 2022-11-10 MED ORDER — SODIUM CHLORIDE 0.9 % IV BOLUS
500.0000 mL | Freq: Once | INTRAVENOUS | Status: AC
  Administered 2022-11-10: 500 mL via INTRAVENOUS

## 2022-11-10 MED ORDER — IOHEXOL 300 MG/ML  SOLN
100.0000 mL | Freq: Once | INTRAMUSCULAR | Status: AC | PRN
  Administered 2022-11-10: 100 mL via INTRAVENOUS

## 2022-11-10 NOTE — Discharge Instructions (Addendum)
Do not take your Eliquis tonight or tomorrow morning.  Call the urologist tomorrow morning to make an appointment.

## 2022-11-10 NOTE — ED Triage Notes (Signed)
Patient arrives ambulatory by POV c/o hematuria onset of this morning. Patient on eliquis. Denies any dizziness or fatigue. Spouse states patient does not drink water like he should.

## 2022-11-10 NOTE — ED Provider Notes (Signed)
[place] EMERGENCY DEPARTMENT AT [facility] Provider Note   CSN: [id number] Arrival date & time: 11/10/22  1114     History  Chief Complaint  Patient presents with   Hematuria    Larry Lopez is a 89 y.o. male.  Pt is a 89 yo male with pmhx significant for afib (on Eliquis), cad, htn, hld, and glaucoma.  Pt has had 3 days of gross hematuria.  Pt denies any pain.  He has no other sx.  He had this one time in the past when he had a UTI.         Home Medications Prior to Admission medications   Medication Sig Start Date End Date Taking? Authorizing Provider  atorvastatin (LIPITOR) 40 MG tablet Take 1 tablet by mouth once daily 07/22/22   Bensimhon, Bevelyn Buckles, MD  B Complex-C (SUPER B COMPLEX PO) Take 1 tablet by mouth daily. Patient not taking: Reported on 05/24/2022    [provider]  calcium carbonate (OSCAL) 1500 (600 Ca) MG TABS tablet Take 1,500 mg by mouth daily.    [provider]  donepezil (ARICEPT) 10 MG tablet TAKE 1 TABLET BY MOUTH AT BEDTIME 10/13/22   Myrlene Broker, MD  ELIQUIS 2.5 MG TABS tablet Take 1 tablet by mouth twice daily 09/17/22   Bensimhon, Bevelyn Buckles, MD  Emollient (CERAVE) CREA Apply 1 Application topically daily as needed (dry , itchy scalp). Applied to scalp as needed Patient not taking: Reported on 05/24/2022    [provider]  famotidine (PEPCID) 20 MG tablet Take 20 mg by mouth daily as needed for heartburn or indigestion. OTC Patient not taking: Reported on 05/24/2022    [provider]  Ginger, Zingiber officinalis, (GINGER PO) See admin instructions. Chew a small portion of ginger root as needed for indigestion Patient not taking: Reported on 05/24/2022    [provider]  hydrochlorothiazide (HYDRODIURIL) 25 MG tablet Take 1 tablet by mouth once daily 06/21/22   Bensimhon, Bevelyn Buckles, MD  metoprolol tartrate (LOPRESSOR) 25 MG tablet TAKE 1 TABLET BY MOUTH ONCE DAILY AS NEEDED. ONLY  USES   IF  NEEDED  FOR  TACHYCARDIA.  HAS  NOT  NEEDED  IN  OVER  A  YEAR 11/01/18   Bensimhon, Bevelyn Buckles, MD  Multiple Vitamin (MULTI-VITAMIN) tablet Take 1 tablet by mouth daily.    [provider]  nitroGLYCERIN (NITROSTAT) 0.4 MG SL tablet PLACE 1 TABLET UNDER THE TONGUE EVERY 5 MINUTES AS NEEDED FOR CHEST PAIN Patient not taking: Reported on 05/24/2022 02/08/22   Bensimhon, Bevelyn Buckles, MD  tamsulosin [facility]) 0.4 MG CAPS capsule Take 1 capsule by mouth once daily 10/13/22   Myrlene Broker, MD  triamcinolone cream (KENALOG) 0.1 % APPLY  CREAM TOPICALLY TO AFFECTED AREA TWICE DAILY Patient not taking: Reported on 05/24/2022 05/21/20   Myrlene Broker, MD      Allergies    Patient has no known allergies.    Review of Systems   Review of Systems  Genitourinary:  Positive for hematuria.  All other systems reviewed and are negative.   Physical Exam Updated Vital Signs BP (!) 141/108   Pulse 66   Temp 97.8 F (36.6 C)   Resp 16   Ht 5\' 11"  (1.803 m)   Wt 69.4 kg   SpO2 99%   BMI 21.34 kg/m  Physical Exam Vitals and nursing note reviewed.  Constitutional:      Appearance: Normal appearance.  HENT:     Head: Normocephalic and atraumatic.     Right Ear: External ear normal.     Left Ear: External ear normal.     Nose: Nose normal.     Mouth/Throat:     Mouth: Mucous membranes are moist.     Pharynx: Oropharynx is clear.  Eyes:     Extraocular Movements: Extraocular movements intact.     Conjunctiva/sclera: Conjunctivae normal.     Pupils: Pupils are equal, round, and reactive to light.  Cardiovascular:     Rate and Rhythm: Normal rate. Rhythm irregular.     Pulses: Normal pulses.     Heart sounds: Normal heart sounds.  Pulmonary:     Effort: Pulmonary effort is normal.     Breath sounds: Normal breath sounds.  Abdominal:     General: Abdomen is flat. Bowel sounds are normal.     Palpations: Abdomen is soft.  Musculoskeletal:        General: Normal range of  motion.     Cervical back: Normal range of motion and neck supple.  Skin:    General: Skin is warm.     Capillary Refill: Capillary refill takes less than 2 seconds.  Neurological:     General: No focal deficit present.     Mental Status: He is alert and oriented to person, place, and time.  Psychiatric:        Mood and Affect: Mood normal.        Behavior: Behavior normal.     ED Results / Procedures / Treatments   Labs (all labs ordered are listed, but only abnormal results are displayed) Labs Reviewed  URINALYSIS, ROUTINE W REFLEX MICROSCOPIC - Abnormal; Notable for the following components:      Result Value   Hgb urine dipstick LARGE (*)    All other components within normal limits  BASIC METABOLIC PANEL - Abnormal; Notable for the following components:   Glucose, Bld 119 (*)    All other components within normal limits  CBC    EKG EKG Interpretation Date/Time:  Wednesday November 10 2022 15:36:08 EDT Ventricular Rate:  39 PR Interval:    QRS Duration:  88 QT Interval:  457 QTC Calculation: 368 R Axis:   18  Text Interpretation: Atrial fibrillation Low voltage, extremity and precordial leads No significant change since last tracing Confirmed by Jacalyn Lefevre [id number]) on 11/10/2022 4:44:06 PM  Radiology CT ABDOMEN PELVIS W CONTRAST  Addendum Date: 11/10/2022   ADDENDUM REPORT: 11/10/2022 18:06 ADDENDUM: New 3 cm abdominal aortic aneurysm. Recommend follow-up ultrasound every 3 years. (Ref.: J Vasc Surg. 2018; 67:2-77 and J Am Coll Radiol 2013;10(10):789-794.) Electronically Signed   By: Darliss Cheney M.D.   On: 11/10/2022 18:06   Result Date: 11/10/2022 CLINICAL DATA:  Gross hematuria EXAM: CT ABDOMEN AND PELVIS WITH CONTRAST TECHNIQUE: Multidetector CT imaging of the abdomen and pelvis was performed using the standard protocol following bolus administration of intravenous contrast. RADIATION DOSE REDUCTION: This exam was performed according to the departmental  dose-optimization program which includes automated exposure control, adjustment of the mA and/or kV according to patient size and/or use of iterative reconstruction technique. CONTRAST:  OMNIPAQUE IOHEXOL 300 MG/ML  SOLN COMPARISON:  CTA abdomen and pelvis 07/19/2016 FINDINGS: Lower chest: There are reticular opacities in both lung bases, increased from prior. Hepatobiliary: Gallstones are present. There is no biliary ductal dilatation. The liver is within normal limits. Pancreas: Unremarkable. No pancreatic ductal dilatation or surrounding inflammatory changes. Spleen:  Low-density splenic subcapsular fluid collection measures 4 mm appears unchanged from the prior study. The spleen is normal in size. No splenic laceration identified. Adrenals/Urinary Tract: The kidneys and adrenal glands are within normal limits. There is a small bladder diverticulum from the dome of the bladder. There is also small posterior left bladder diverticulum. On the sagittal view there some wall thickening of the posterior and anterior bladder wall measuring up to 8 mm in thickness. No bladder calculi or surrounding inflammation. Stomach/Bowel: Stomach is within normal limits. Appendix appears normal. No evidence of bowel wall thickening, distention, or inflammatory changes. There is colonic diverticulosis. Vascular/Lymphatic: Aorta is mildly dilated measuring up to 3 cm in diameter, new from prior. There is atherosclerotic disease throughout the aorta and iliac arteries. IVC normal in size. No enlarged lymph nodes are identified. Reproductive: Prostate gland is enlarged. Other: There is a small right inguinal hernia containing fat and nondilated small bowel. There is no ascites. Musculoskeletal: Degenerative changes affect the spine and hips. IMPRESSION: 1. Posterior bladder wall thickening measuring up to 8 mm in thickness. Findings are nonspecific and may be related to cystitis or neoplasm. Correlate clinically and consider  cystoscopy. 2. Small bladder diverticula. 3. Prostatomegaly. 4. Cholelithiasis. 5. Colonic diverticulosis. 6. Small right inguinal hernia containing fat and nondilated small bowel. 7. Increased reticular opacities in both lung bases, increased from prior. Aortic Atherosclerosis (ICD10-I70.0). Electronically Signed: By: Darliss Cheney M.D. On: 11/10/2022 17:36    Procedures Procedures    Medications Ordered in ED Medications  sodium chloride 0.9 % bolus 500 mL (500 mLs Intravenous New Bag/Given 11/10/22 1525)  iohexol (OMNIPAQUE) 300 MG/ML solution 100 mL (100 mLs Intravenous Contrast Given 11/10/22 1553)    ED Course/ Medical Decision Making/ A&P                                 Medical Decision Making Amount and/or Complexity of Data Reviewed Labs: ordered. Radiology: ordered.  Risk Prescription drug management.   This patient presents to the ED for concern of hematuria, this involves an extensive number of treatment options, and is a complaint that carries with it a high risk of complications and morbidity.  The differential diagnosis includes uti, kidney stone, eliquis, bladder ca   Co morbidities that complicate the patient evaluation  afib (on Eliquis), cad, htn, hld, and glaucoma.  Pt has had 3 days of gross hematuria   Additional history obtained:  Additional history obtained from epic chart review External records from outside source obtained and reviewed including wife   Lab Tests:  I Ordered, and personally interpreted labs.  The pertinent results include:  ua with hematuria, no uti; cbc nl, bmp nl   Imaging Studies ordered:  I ordered imaging studies including ct abd/pelvis  I independently visualized and interpreted imaging which showed  1. Posterior bladder wall thickening measuring up to 8 mm in  thickness. Findings are nonspecific and may be related to cystitis  or neoplasm. Correlate clinically and consider cystoscopy.  2. Small bladder diverticula.   3. Prostatomegaly.  4. Cholelithiasis.  5. Colonic diverticulosis.  6. Small right inguinal hernia containing fat and nondilated small  bowel.  7. Increased reticular opacities in both lung bases, increased from  prior.   I agree with the radiologist interpretation   Cardiac Monitoring:  The patient was maintained on a cardiac monitor.  I personally viewed and interpreted the cardiac monitored which showed an  underlying rhythm of: afib   Medicines ordered and prescription drug management:  I ordered medication including ivfs  for hematuria  Reevaluation of the patient after these medicines showed that the patient improved I have reviewed the patients home medicines and have made adjustments as needed   Test Considered:  ct   Consultations Obtained:  I requested consultation with the urologist (Dr. Laverle Patter),  and discussed lab and imaging findings as well as pertinent plan - he recommends the pt call the office in the am to make an appt to be seen.   Problem List / ED Course:  Hematuria:  pt does have an abnormality of his posterior bladder wall.  He is told to hold the eliquis tonight and tomorrow.  He is to f/u with urology.  Return if worse.     Reevaluation:  After the interventions noted above, I reevaluated the patient and found that they have :improved   Social Determinants of Health:  Lives at home   Dispostion:  After consideration of the diagnostic results and the patients response to treatment, I feel that the patent would benefit from discharge with outpatient f/u.          Final Clinical Impression(s) / ED Diagnoses Final diagnoses:  Gross hematuria  Bladder mass  On apixaban therapy    Rx / DC Orders ED Discharge Orders     None         Jacalyn Lefevre, MD 11/10/22 4098

## 2022-11-12 DIAGNOSIS — N401 Enlarged prostate with lower urinary tract symptoms: Secondary | ICD-10-CM | POA: Diagnosis not present

## 2022-11-12 DIAGNOSIS — R310 Gross hematuria: Secondary | ICD-10-CM | POA: Diagnosis not present

## 2022-11-12 DIAGNOSIS — R351 Nocturia: Secondary | ICD-10-CM | POA: Diagnosis not present

## 2022-11-12 DIAGNOSIS — N3941 Urge incontinence: Secondary | ICD-10-CM | POA: Diagnosis not present

## 2022-11-17 DIAGNOSIS — H6123 Impacted cerumen, bilateral: Secondary | ICD-10-CM | POA: Diagnosis not present

## 2022-11-24 DIAGNOSIS — H524 Presbyopia: Secondary | ICD-10-CM | POA: Diagnosis not present

## 2022-11-24 DIAGNOSIS — H43813 Vitreous degeneration, bilateral: Secondary | ICD-10-CM | POA: Diagnosis not present

## 2022-11-24 DIAGNOSIS — Z961 Presence of intraocular lens: Secondary | ICD-10-CM | POA: Diagnosis not present

## 2022-11-24 DIAGNOSIS — H01004 Unspecified blepharitis left upper eyelid: Secondary | ICD-10-CM | POA: Diagnosis not present

## 2022-11-24 DIAGNOSIS — H0100A Unspecified blepharitis right eye, upper and lower eyelids: Secondary | ICD-10-CM | POA: Diagnosis not present

## 2022-11-24 DIAGNOSIS — D3131 Benign neoplasm of right choroid: Secondary | ICD-10-CM | POA: Diagnosis not present

## 2022-11-24 DIAGNOSIS — H52203 Unspecified astigmatism, bilateral: Secondary | ICD-10-CM | POA: Diagnosis not present

## 2022-11-24 DIAGNOSIS — H401132 Primary open-angle glaucoma, bilateral, moderate stage: Secondary | ICD-10-CM | POA: Diagnosis not present

## 2022-12-14 DIAGNOSIS — H6123 Impacted cerumen, bilateral: Secondary | ICD-10-CM | POA: Diagnosis not present

## 2022-12-30 ENCOUNTER — Other Ambulatory Visit (HOSPITAL_COMMUNITY): Payer: Self-pay | Admitting: Cardiology

## 2022-12-30 MED ORDER — APIXABAN 2.5 MG PO TABS: 2.5000 mg | ORAL_TABLET | Freq: Two times a day (BID) | ORAL | 0 refills | Status: DC

## 2022-12-31 ENCOUNTER — Other Ambulatory Visit: Payer: Self-pay | Admitting: Internal Medicine

## 2022-12-31 NOTE — Telephone Encounter (Unsigned)
Copied from CRM [id number]. Topic: Clinical - Medication Refill >> Dec 31, 2022 12:59 PM Josefa Half C wrote: Most Recent Primary Care Visit:  Provider: Daryll Brod  Department: [facility]  Visit Type: NURSE VISIT  Date: 09/27/2022  Medication: donepezil (ARICEPT) 10 MG tablet  Has the patient contacted their pharmacy? Yes, PT was instructed to call provider to send in refill. (Agent: If no, request that the patient contact the pharmacy for the refill. If patient does not wish to contact the pharmacy document the reason why and proceed with request.) (Agent: If yes, when and what did the pharmacy advise?)  Is this the correct pharmacy for this prescription? Yes If no, delete pharmacy and type the correct one.  This is the patient's preferred pharmacy:  [facility] [address], [place] - [address] [place] [address] Phone: [phone] Fax: [phone]   Has the prescription been filled recently? No  Is the patient out of the medication? Yes  Has the patient been seen for an appointment in the last year OR does the patient have an upcoming appointment? {yes/no:[id number]}  Can we respond through MyChart? Yes  Agent: Please be advised that Rx refills may take up to 3 business days. We ask that you follow-up with your pharmacy.

## 2023-01-20 ENCOUNTER — Encounter (HOSPITAL_COMMUNITY): Payer: Self-pay | Admitting: Internal Medicine

## 2023-01-20 ENCOUNTER — Ambulatory Visit (HOSPITAL_COMMUNITY)
Admission: RE | Admit: 2023-01-20 | Discharge: 2023-01-20 | Disposition: A | Discharge status: Home / Self Care | Payer: Medicare Other | Source: Ambulatory Visit | Attending: Internal Medicine | Admitting: Internal Medicine

## 2023-01-20 VITALS — BP 118/78 | HR 77 | Wt 159.0 lb

## 2023-01-20 DIAGNOSIS — E782 Mixed hyperlipidemia: Secondary | ICD-10-CM

## 2023-01-20 DIAGNOSIS — R001 Bradycardia, unspecified: Secondary | ICD-10-CM | POA: Diagnosis not present

## 2023-01-20 DIAGNOSIS — Z7901 Long term (current) use of anticoagulants: Secondary | ICD-10-CM | POA: Insufficient documentation

## 2023-01-20 DIAGNOSIS — Z951 Presence of aortocoronary bypass graft: Secondary | ICD-10-CM | POA: Insufficient documentation

## 2023-01-20 DIAGNOSIS — E785 Hyperlipidemia, unspecified: Secondary | ICD-10-CM | POA: Insufficient documentation

## 2023-01-20 DIAGNOSIS — I480 Paroxysmal atrial fibrillation: Secondary | ICD-10-CM | POA: Diagnosis not present

## 2023-01-20 DIAGNOSIS — I482 Chronic atrial fibrillation, unspecified: Secondary | ICD-10-CM | POA: Insufficient documentation

## 2023-01-20 DIAGNOSIS — I10 Essential (primary) hypertension: Secondary | ICD-10-CM | POA: Insufficient documentation

## 2023-01-20 DIAGNOSIS — I2510 Atherosclerotic heart disease of native coronary artery without angina pectoris: Secondary | ICD-10-CM | POA: Diagnosis not present

## 2023-01-20 DIAGNOSIS — R54 Age-related physical debility: Secondary | ICD-10-CM | POA: Diagnosis not present

## 2023-01-20 NOTE — Patient Instructions (Addendum)
 Good to see you today!  We will see you back in 1 year   Please call our office around December of 2025 to get scheduled   Labs done today, your results will be available in MyChart, we will contact you for abnormal readings.  If you have any questions or concerns before your next appointment please send us  a message through [place] or call our office at [phone].    TO LEAVE A MESSAGE FOR THE NURSE SELECT OPTION 2, PLEASE LEAVE A MESSAGE INCLUDING: YOUR NAME DATE OF BIRTH CALL BACK NUMBER REASON FOR CALL**this is important as we prioritize the call backs  YOU WILL RECEIVE A CALL BACK THE SAME DAY AS LONG AS YOU CALL BEFORE 4:00 PM  At the [facility], you and your health needs are our priority. As part of our continuing mission to provide you with exceptional heart care, we have created designated Provider Care Teams. These Care Teams include your primary Cardiologist (physician) and Advanced Practice Providers (APPs- Physician Assistants and Nurse Practitioners) who all work together to provide you with the care you need, when you need it.   You may see any of the following providers on your designated Care Team at your next follow up: Dr Toribio Fuel Dr Ezra Shuck Dr. Ria Commander Dr. Morene Brownie Amy Lenetta, NP Caffie Shed, [place] [facility] [place], [place] Beckey Coe, NP Jordan Lee, NP Tinnie Redman, PharmD   Please be sure to bring in all your medications bottles to every appointment.    Thank you for choosing [facility]

## 2023-01-20 NOTE — Progress Notes (Addendum)
 CARDIOLOGY CLINIC NOTE  Patient ID: Larry Lopez, male   DOB: Sep 16, 1926, 89 y.o.   MRN: [id number]  HPI:  Larry Lopez) is a 89 y.o. male  with a history of a coronary artery disease status post redo bypass surgery in 2002 by Dr. Lucas.SABRA He also has a history of hypertension and hyperlipidemia, PAF complicated by  tachy-brady syndrome with significant resting bradycardia prohibiting AV-Nodal blockade. Was hospitalized in 6/10 with rapid AF.  He saw Dr. Fernande previously for consideration of pacemaker but it was thought that as AF is infrequent and self-limiting and his bradycardia is asymptomatic  that we would watch it for now and avoid AV-nodal blockers due to bradycardia. If AF burden increased would then consider pacing and re-insitutuion of AV-nodal blockers.  On 02/06/13 underwent DC-CV for recurrent AF but quickly reverted back to AF.   He had Lexiscan  Myoview  08/08/13 EF 56% Echo read as 40-45% (Dr. Cherrie reviewed personally and thought EF closer to 50-55%.  Had fall in 7/18 with splenic laceration Eliquis  stopped for several weeks.   Here for routine f/u. Here with his wife and his daughter. Now at [facility] for about a month. Struggling with his memory. Wife upse over violent death of her 62yo son. Denies CP or SOB. No edema. Has not been very active. No bleeding on Eliquis  2.5 bid    Lab Results  Component Value Date   CHOL 199 02/03/2021   HDL 53.10 02/03/2021   LDLCALC 116 (H) 02/03/2021   TRIG 151.0 (H) 02/03/2021   CHOLHDL 4 02/03/2021    ROS: All systems negative except as listed in HPI, PMH and Problem List.  Past Medical History:  Diagnosis Date   Atrial fibrillation (HCC)    Bradycardia    CAD (coronary artery disease)    Glaucoma    HLD (hyperlipidemia)    HTN (hypertension)    Tachy-brady syndrome (HCC)     Current Outpatient Medications  Medication Sig Dispense Refill   apixaban  (ELIQUIS ) 2.5 MG TABS tablet Take 1 tablet (2.5 mg  total) by mouth 2 (two) times daily. 180 tablet 0   atorvastatin  (LIPITOR) 40 MG tablet Take 1 tablet by mouth once daily 90 tablet 3   calcium  carbonate (OSCAL) 1500 (600 Ca) MG TABS tablet Take 1,500 mg by mouth daily.     donepezil  (ARICEPT ) 10 MG tablet TAKE 1 TABLET BY MOUTH AT BEDTIME 90 tablet 0   Emollient (CERAVE) CREA Apply 1 Application topically daily as needed (dry , itchy scalp). Applied to scalp as needed     famotidine (PEPCID) 20 MG tablet Take 20 mg by mouth daily as needed for heartburn or indigestion. OTC     Ginger, Zingiber officinalis, (GINGER PO) See admin instructions. Chew a small portion of ginger root as needed for indigestion     hydrochlorothiazide  (HYDRODIURIL ) 25 MG tablet Take 1 tablet by mouth once daily 90 tablet 3   metoprolol  tartrate (LOPRESSOR ) 25 MG tablet TAKE 1 TABLET BY MOUTH ONCE DAILY AS NEEDED. ONLY  USES  IF  NEEDED  FOR  TACHYCARDIA.  HAS  NOT  NEEDED  IN  OVER  A  YEAR 90 tablet 0   Multiple Vitamin (MULTI-VITAMIN) tablet Take 1 tablet by mouth daily.     Multiple Vitamins-Minerals (PRESERVISION AREDS 2) CAPS Take 1 capsule by mouth daily.     nitroGLYCERIN  (NITROSTAT ) 0.4 MG SL tablet PLACE 1 TABLET UNDER THE TONGUE EVERY 5 MINUTES AS NEEDED FOR  CHEST PAIN 25 tablet 0   tamsulosin  (FLOMAX ) 0.4 MG CAPS capsule Take 1 capsule by mouth once daily 90 capsule 0   timolol  (BETIMOL ) 0.5 % ophthalmic solution Place 1 drop into both eyes 2 (two) times daily.     triamcinolone  cream (KENALOG ) 0.1 % APPLY  CREAM TOPICALLY TO AFFECTED AREA TWICE DAILY 10 g 0   No current facility-administered medications for this encounter.    PHYSICAL EXAM: Vitals:   01/20/23 1159  BP: 118/78  Pulse: 77  SpO2: 98%  Weight: 72.1 kg (159 lb)   Wt Readings from Last 3 Encounters:  01/20/23 72.1 kg (159 lb)  11/10/22 69.4 kg (153 lb)  05/24/22 69.4 kg (153 lb)    General:  Elderly thin. No resp difficulty HEENT: normal Neck: supple. no JVD. Carotids 2+ bilat; no  bruits. No lymphadenopathy or thryomegaly appreciated. Cor: Irregular rate & rhythm. No rubs, gallops or murmurs. Lungs: clear Abdomen: soft, nontender, nondistended. No hepatosplenomegaly. No bruits or masses. Good bowel sounds. Extremities: no cyanosis, clubbing, rash, edema Neuro: alert & orientedx3, cranial nerves grossly intact. moves all 4 extremities w/o difficulty. Affect pleasant  ASSESSMENT & PLAN:  1. Chronic AF - Rate controlled. On Eliquis  2.5 bid. No bleeding 2. HTN  - Blood pressure well controlled. Continue current regimen. 3. H/o tachy brady syndrome stable - no indication for PPM 4. CAD s/p re-do CABG 2002 - no s/s ischemia - continue statin - off ASA with Eliquis  5. Hyperlipidemia  - followed by PCP - Goal LDL < 70. Continue statin  6. Larry Lopez - encouraged daily activity and resistance training   Toribio Fuel, MD 12:07 PM

## 2023-01-23 ENCOUNTER — Other Ambulatory Visit (HOSPITAL_COMMUNITY): Payer: Self-pay | Admitting: Internal Medicine

## 2023-01-26 ENCOUNTER — Other Ambulatory Visit: Payer: Self-pay | Admitting: Internal Medicine

## 2023-01-26 NOTE — Telephone Encounter (Signed)
 Copied from CRM [id number]. Topic: Clinical - Medication Refill >> Jan 26, 2023  5:07 PM Corin V wrote: Most Recent Primary Care Visit:  Provider: [place] , Latanya Poisson  Department: [facility]  Visit Type: NURSE VISIT  Date: 09/27/2022  Medication: donepezil  (ARICEPT ) 10 MG tablet tamsulosin  (FLOMAX ) 0.4 MG CAPS capsule  Has the patient contacted their pharmacy? Yes- no refills remaining (Agent: If no, request that the patient contact the pharmacy for the refill. If patient does not wish to contact the pharmacy document the reason why and proceed with request.) (Agent: If yes, when and what did the pharmacy advise?)  Is this the correct pharmacy for this prescription? Yes If no, delete pharmacy and type the correct one.  This is the patient's preferred pharmacy:   [facility] [place], [place]   Has the prescription been filled recently? No  Is the patient out of the medication? No  Has the patient been seen for an appointment in the last year OR does the patient have an upcoming appointment? Yes  Can we respond through MyChart? No  Agent: Please be advised that Rx refills may take up to 3 business days. We ask that you follow-up with your pharmacy.

## 2023-01-27 MED ORDER — TAMSULOSIN HCL 0.4 MG PO CAPS: 0.4000 mg | ORAL_CAPSULE | Freq: Every day | ORAL | 0 refills | Status: DC

## 2023-02-21 DIAGNOSIS — R351 Nocturia: Secondary | ICD-10-CM | POA: Diagnosis not present

## 2023-02-21 DIAGNOSIS — R310 Gross hematuria: Secondary | ICD-10-CM | POA: Diagnosis not present

## 2023-02-21 DIAGNOSIS — N401 Enlarged prostate with lower urinary tract symptoms: Secondary | ICD-10-CM | POA: Diagnosis not present

## 2023-02-21 DIAGNOSIS — R3915 Urgency of urination: Secondary | ICD-10-CM | POA: Diagnosis not present

## 2023-02-23 ENCOUNTER — Other Ambulatory Visit (HOSPITAL_COMMUNITY): Payer: Self-pay | Admitting: Cardiology

## 2023-02-23 ENCOUNTER — Other Ambulatory Visit (HOSPITAL_COMMUNITY): Payer: Self-pay | Admitting: *Deleted

## 2023-02-23 ENCOUNTER — Telehealth: Payer: Self-pay | Admitting: Internal Medicine

## 2023-02-23 DIAGNOSIS — I482 Chronic atrial fibrillation, unspecified: Secondary | ICD-10-CM

## 2023-02-23 DIAGNOSIS — I2510 Atherosclerotic heart disease of native coronary artery without angina pectoris: Secondary | ICD-10-CM

## 2023-02-23 MED ORDER — HYDROCHLOROTHIAZIDE 25 MG PO TABS: 25.0000 mg | ORAL_TABLET | Freq: Every day | ORAL | 3 refills | Status: DC

## 2023-02-23 NOTE — Telephone Encounter (Signed)
Patient needs an appointment last OV was 1/24 over a year

## 2023-02-23 NOTE — Telephone Encounter (Signed)
Copied from CRM [id number]. Topic: Clinical - Prescription Issue >> Feb 23, 2023 12:19 PM [place] J wrote: Reason for CRM: Patient calling in needing scripts from DR. Crawford due to switching pharmacy to [facility]. Pharmacy is requesting scripts for tamsulosin (FLOMAX) 0.4 MG CAPS and donepezil (ARICEPT) 10 MG tablet.   [facility]: [address] [place], [place] [address]

## 2023-02-24 DIAGNOSIS — C44612 Basal cell carcinoma of skin of right upper limb, including shoulder: Secondary | ICD-10-CM | POA: Diagnosis not present

## 2023-02-24 DIAGNOSIS — L570 Actinic keratosis: Secondary | ICD-10-CM | POA: Diagnosis not present

## 2023-02-24 DIAGNOSIS — L814 Other melanin hyperpigmentation: Secondary | ICD-10-CM | POA: Diagnosis not present

## 2023-02-24 DIAGNOSIS — Z85828 Personal history of other malignant neoplasm of skin: Secondary | ICD-10-CM | POA: Diagnosis not present

## 2023-02-24 DIAGNOSIS — D485 Neoplasm of uncertain behavior of skin: Secondary | ICD-10-CM | POA: Diagnosis not present

## 2023-02-24 DIAGNOSIS — C44519 Basal cell carcinoma of skin of other part of trunk: Secondary | ICD-10-CM | POA: Diagnosis not present

## 2023-02-24 DIAGNOSIS — D1801 Hemangioma of skin and subcutaneous tissue: Secondary | ICD-10-CM | POA: Diagnosis not present

## 2023-02-24 DIAGNOSIS — L821 Other seborrheic keratosis: Secondary | ICD-10-CM | POA: Diagnosis not present

## 2023-02-24 DIAGNOSIS — C44719 Basal cell carcinoma of skin of left lower limb, including hip: Secondary | ICD-10-CM | POA: Diagnosis not present

## 2023-03-04 ENCOUNTER — Telehealth: Payer: Self-pay | Admitting: Internal Medicine

## 2023-03-04 NOTE — Telephone Encounter (Signed)
Copied from CRM [id number]. Topic: Clinical - Home Health Verbal Orders >> Mar 04, 2023 12:48 PM Almira Coaster wrote: Caller/Agency: Lysle Pearl / [facility] Callback Number: [phone] Service Requested: Physical Therapy Frequency: Evaluation (weak lower extremities and poor endurance) Any new concerns about the patient? No, they have sent a referral request to the office through fax and have not gotten a response.

## 2023-03-07 NOTE — Telephone Encounter (Signed)
 For any home health request for orders we would need visit for medicare requirements. Please schedule

## 2023-03-09 DIAGNOSIS — R2681 Unsteadiness on feet: Secondary | ICD-10-CM | POA: Diagnosis not present

## 2023-03-11 DIAGNOSIS — R2681 Unsteadiness on feet: Secondary | ICD-10-CM | POA: Diagnosis not present

## 2023-03-15 DIAGNOSIS — R2681 Unsteadiness on feet: Secondary | ICD-10-CM | POA: Diagnosis not present

## 2023-03-17 DIAGNOSIS — R2681 Unsteadiness on feet: Secondary | ICD-10-CM | POA: Diagnosis not present

## 2023-03-22 DIAGNOSIS — R351 Nocturia: Secondary | ICD-10-CM | POA: Diagnosis not present

## 2023-03-22 DIAGNOSIS — R2681 Unsteadiness on feet: Secondary | ICD-10-CM | POA: Diagnosis not present

## 2023-03-22 DIAGNOSIS — N401 Enlarged prostate with lower urinary tract symptoms: Secondary | ICD-10-CM | POA: Diagnosis not present

## 2023-03-22 DIAGNOSIS — N3941 Urge incontinence: Secondary | ICD-10-CM | POA: Diagnosis not present

## 2023-03-24 ENCOUNTER — Ambulatory Visit: Payer: Self-pay | Admitting: Internal Medicine

## 2023-03-24 DIAGNOSIS — C44612 Basal cell carcinoma of skin of right upper limb, including shoulder: Secondary | ICD-10-CM | POA: Diagnosis not present

## 2023-03-24 NOTE — Telephone Encounter (Signed)
 Chief Complaint: Streaks of blood in phlegm Symptoms: see above Frequency: once today Pertinent Negatives: Patient denies fever, shortness of breath Disposition: [] ED /[] Urgent Care (no appt availability in office) / [] Appointment(In office/virtual)/ []  [facility]/ [x] Home Care/ [] Refused Recommended Disposition /[] [facility]/ []  Follow-up with PCP Additional Notes: Patient's daughter called in reporting patient had a 'phlegm spit up with some blood in it'. Patient states earlier today, patient had a cough up of yellow phlegm. Once they returned home, stated he had a spit up/phlegm of blood streaked. Patient's daughter stated patient has not had fever, illness symptoms, shortness of breath. Patient is on eliquis. Advised to continue monitoring and call back if symptoms continue. Advised if amount of blood greatly increases, to seek evaluation at emergency department.   Copied from CRM [id number]. Topic: Clinical - Red Word Triage >> Mar 24, 2023  4:44 PM Eunice Blase wrote: Red Word that prompted transfer to Nurse Triage: Pt's daughter called stated pt is vomiting and some blood is seen. Reason for Disposition  ALSO, mild vomiting occurs only when coughing    Mild specks of blood  Answer Assessment - Initial Assessment Questions 1. ONSET: "When did the cough begin?"      Today 2. SEVERITY: "How bad is the cough today?"      Normal cough but today he had two spit ups yellow and bloody 3. SPUTUM: "Describe the color of your sputum" (none, dry cough; clear, white, yellow, green)     Yellow 4. HEMOPTYSIS: "Are you coughing up any blood?" If so ask: "How much?" (flecks, streaks, tablespoons, etc.)     Streaks of blood 5. DIFFICULTY BREATHING: "Are you having difficulty breathing?" If Yes, ask: "How bad is it?" (e.g., mild, moderate, severe)    - MILD: No SOB at rest, mild SOB with walking, speaks normally in sentences, can lie down, no retractions, pulse < 100.    - MODERATE:  SOB at rest, SOB with minimal exertion and prefers to sit, cannot lie down flat, speaks in phrases, mild retractions, audible wheezing, pulse 100-120.    - SEVERE: Very SOB at rest, speaks in single words, struggling to breathe, sitting hunched forward, retractions, pulse > 120      No 6. FEVER: "Do you have a fever?" If Yes, ask: "What is your temperature, how was it measured, and when did it start?"     No 7. CARDIAC HISTORY: "Do you have any history of heart disease?" (e.g., heart attack, congestive heart failure)      No 8. LUNG HISTORY: "Do you have any history of lung disease?"  (e.g., pulmonary embolus, asthma, emphysema)     No 9. PE RISK FACTORS: "Do you have a history of blood clots?" (or: recent major surgery, recent prolonged travel, bedridden)     No - patient is on Eliquis 10. OTHER SYMPTOMS: "Do you have any other symptoms?" (e.g., runny nose, wheezing, chest pain)       No  Protocols used: Cough - Acute Productive-A-AH

## 2023-03-29 ENCOUNTER — Other Ambulatory Visit (HOSPITAL_COMMUNITY): Payer: Self-pay | Admitting: Internal Medicine

## 2023-03-29 DIAGNOSIS — R2681 Unsteadiness on feet: Secondary | ICD-10-CM | POA: Diagnosis not present

## 2023-03-30 ENCOUNTER — Telehealth: Payer: Self-pay | Admitting: Internal Medicine

## 2023-03-30 NOTE — Telephone Encounter (Signed)
 Copied from CRM [id number]. Topic: General - Other >> Mar 30, 2023 11:58 AM Gurney Maxin H wrote: Reason for CRM: Larry Lopez is calling to follow up on plan of care that was faxed over to office on 2/17 and 3/9, states they never received a signed copy back from provider. Larry Lopez states document can either be faxed or emailed to [phone] phone/[phone] fax, [email or web address] .Shirley Friar [phone]

## 2023-03-31 DIAGNOSIS — R2681 Unsteadiness on feet: Secondary | ICD-10-CM | POA: Diagnosis not present

## 2023-03-31 NOTE — Telephone Encounter (Signed)
 This can not be signed until patient is seen which she does have an upcoming appointment

## 2023-04-04 ENCOUNTER — Ambulatory Visit: Admitting: Internal Medicine

## 2023-04-05 ENCOUNTER — Telehealth: Payer: Self-pay | Admitting: Internal Medicine

## 2023-04-05 ENCOUNTER — Other Ambulatory Visit: Payer: Self-pay | Admitting: Internal Medicine

## 2023-04-05 DIAGNOSIS — Z23 Encounter for immunization: Secondary | ICD-10-CM | POA: Diagnosis not present

## 2023-04-05 NOTE — Telephone Encounter (Signed)
 Patient's daughter called and requested refill for  donepezil (ARICEPT) 10 MG tablet states they have recently changed pharmacy and this RX did not get transferred. I have updated pharmacy in chart. RX needs to go to [facility] [place], [place] - [address]   Patient was seen in office 02/05/22 and has f/u scheduled for 4/11.

## 2023-04-06 NOTE — Telephone Encounter (Signed)
 Duplicate, see telephone encounter for medication refill  from [facility], routed to clinical staff. Closing this duplicate request.

## 2023-04-06 NOTE — Telephone Encounter (Signed)
 [place] to refill 90 day

## 2023-04-07 ENCOUNTER — Other Ambulatory Visit: Payer: Self-pay

## 2023-04-07 DIAGNOSIS — R2681 Unsteadiness on feet: Secondary | ICD-10-CM | POA: Diagnosis not present

## 2023-04-07 MED ORDER — DONEPEZIL HCL 10 MG PO TABS: 10.0000 mg | ORAL_TABLET | Freq: Every day | ORAL | 0 refills | Status: DC

## 2023-04-07 NOTE — Telephone Encounter (Signed)
 Done

## 2023-04-11 DIAGNOSIS — H6123 Impacted cerumen, bilateral: Secondary | ICD-10-CM | POA: Diagnosis not present

## 2023-04-11 DIAGNOSIS — H903 Sensorineural hearing loss, bilateral: Secondary | ICD-10-CM | POA: Diagnosis not present

## 2023-04-12 DIAGNOSIS — R2681 Unsteadiness on feet: Secondary | ICD-10-CM | POA: Diagnosis not present

## 2023-04-14 DIAGNOSIS — R2681 Unsteadiness on feet: Secondary | ICD-10-CM | POA: Diagnosis not present

## 2023-04-15 DIAGNOSIS — R2681 Unsteadiness on feet: Secondary | ICD-10-CM | POA: Diagnosis not present

## 2023-04-19 DIAGNOSIS — R2681 Unsteadiness on feet: Secondary | ICD-10-CM | POA: Diagnosis not present

## 2023-04-21 DIAGNOSIS — R2681 Unsteadiness on feet: Secondary | ICD-10-CM | POA: Diagnosis not present

## 2023-04-22 ENCOUNTER — Encounter: Payer: Self-pay | Admitting: Internal Medicine

## 2023-04-22 ENCOUNTER — Other Ambulatory Visit: Payer: Self-pay | Admitting: Internal Medicine

## 2023-04-22 ENCOUNTER — Ambulatory Visit: Admitting: Internal Medicine

## 2023-04-22 VITALS — BP 112/68 | HR 80 | Temp 97.6°F | Ht 71.0 in | Wt 154.4 lb

## 2023-04-22 DIAGNOSIS — I482 Chronic atrial fibrillation, unspecified: Secondary | ICD-10-CM

## 2023-04-22 DIAGNOSIS — N400 Enlarged prostate without lower urinary tract symptoms: Secondary | ICD-10-CM | POA: Diagnosis not present

## 2023-04-22 DIAGNOSIS — I5022 Chronic systolic (congestive) heart failure: Secondary | ICD-10-CM

## 2023-04-22 DIAGNOSIS — I2510 Atherosclerotic heart disease of native coronary artery without angina pectoris: Secondary | ICD-10-CM | POA: Diagnosis not present

## 2023-04-22 DIAGNOSIS — R413 Other amnesia: Secondary | ICD-10-CM

## 2023-04-22 DIAGNOSIS — I10 Essential (primary) hypertension: Secondary | ICD-10-CM | POA: Diagnosis not present

## 2023-04-22 DIAGNOSIS — R2681 Unsteadiness on feet: Secondary | ICD-10-CM | POA: Diagnosis not present

## 2023-04-22 DIAGNOSIS — E782 Mixed hyperlipidemia: Secondary | ICD-10-CM | POA: Diagnosis not present

## 2023-04-22 MED ORDER — HYDROCHLOROTHIAZIDE 12.5 MG PO TABS: 12.5000 mg | ORAL_TABLET | Freq: Every day | ORAL | 3 refills | Status: DC

## 2023-04-22 NOTE — Assessment & Plan Note (Signed)
 BP with room to increase and urinary frequency. Will reduce dosing hydrochlorothiazide to 12.5 mg daily. New rx done.

## 2023-04-22 NOTE — Telephone Encounter (Signed)
 Copied from CRM [id number]. Topic: Clinical - Medication Refill >> Apr 22, 2023  3:52 PM Drema Balzarine wrote: Most Recent Primary Care Visit:  Provider: Daryll Brod  Department: [facility]  Visit Type: NURSE VISIT  Date: 09/27/2022  Medication: tamsulosin   Has the patient contacted their pharmacy? Yes (Agent: If no, request that the patient contact the pharmacy for the refill. If patient does not wish to contact the pharmacy document the reason why and proceed with request.) (Agent: If yes, when and what did the pharmacy advise?)  Is this the correct pharmacy for this prescription? Yes If no, delete pharmacy and type the correct one.  This is the patient's preferred pharmacy:   [facility] [place], [place] - [address] [place] [address] Phone: [phone] Fax: [phone]   Has the prescription been filled recently? Yes  Is the patient out of the medication? No  Has the patient been seen for an appointment in the last year OR does the patient have an upcoming appointment? Yes  Can we respond through MyChart? No  Agent: Please be advised that Rx refills may take up to 3 business days. We ask that you follow-up with your pharmacy.

## 2023-04-22 NOTE — Progress Notes (Signed)
   Subjective:   Patient ID: Larry Lopez, male    DOB: 09/15/1926, 89 y.o.   MRN: [id number]  HPI The patient is a 89 YO man coming in for medical management (see a/p for details). No new health changes.   Review of Systems  Unable to perform ROS: Dementia  Constitutional: Negative.   HENT: Negative.    Eyes: Negative.   Respiratory:  Negative for cough, chest tightness and shortness of breath.   Cardiovascular:  Negative for chest pain, palpitations and leg swelling.  Gastrointestinal:  Negative for abdominal distention, abdominal pain, constipation, diarrhea, nausea and vomiting.  Genitourinary:  Positive for frequency.  Musculoskeletal: Negative.   Skin: Negative.   Neurological: Negative.   Psychiatric/Behavioral: Negative.      Objective:  Physical Exam Constitutional:      Appearance: He is well-developed.  HENT:     Head: Normocephalic and atraumatic.  Cardiovascular:     Rate and Rhythm: Normal rate and regular rhythm.  Pulmonary:     Effort: Pulmonary effort is normal. No respiratory distress.     Breath sounds: Normal breath sounds. No wheezing or rales.  Abdominal:     General: Bowel sounds are normal. There is no distension.     Palpations: Abdomen is soft.     Tenderness: There is no abdominal tenderness. There is no rebound.  Musculoskeletal:     Cervical back: Normal range of motion.  Skin:    General: Skin is warm and dry.  Neurological:     Mental Status: He is alert. Mental status is at baseline.     Coordination: Coordination normal.     Vitals:   04/22/23 1209  BP: 112/68  Pulse: 80  Temp: 97.6 F (36.4 C)  SpO2: 99%  Weight: 154 lb 6.4 oz (70 kg)  Height: 5\' 11"  (1.803 m)    Assessment & Plan:

## 2023-04-22 NOTE — Assessment & Plan Note (Signed)
 Due to age and taking aricept 10 mg daily. This has progressed some from last year and he is sleeping more during the day. No behavioral changes. Continue.

## 2023-04-22 NOTE — Assessment & Plan Note (Signed)
 Taking flomax and will continue.

## 2023-04-22 NOTE — Assessment & Plan Note (Signed)
 Continue eliquis 2.5 mg BID and has beta blocker prn only. No EKG done but sounds regular today. Reviewed recent CBC/CMP

## 2023-04-22 NOTE — Assessment & Plan Note (Signed)
 Continue lipitor and desires not to have routine labs due to age none checked today.

## 2023-04-22 NOTE — Assessment & Plan Note (Signed)
 No flare today and will slightly lower hydrochlorothiazide to 12.5 mg daily.

## 2023-04-22 NOTE — Patient Instructions (Signed)
 We do not need any labs today and we will lower the dose of the blood pressure medicine hydrochlorothiazide. This is a fluid pill and so lowering the dose may help cut down on amount of peeing.

## 2023-04-25 MED ORDER — TAMSULOSIN HCL 0.4 MG PO CAPS: 0.4000 mg | ORAL_CAPSULE | Freq: Every day | ORAL | 0 refills | Status: DC

## 2023-04-26 DIAGNOSIS — R2681 Unsteadiness on feet: Secondary | ICD-10-CM | POA: Diagnosis not present

## 2023-04-28 DIAGNOSIS — R2681 Unsteadiness on feet: Secondary | ICD-10-CM | POA: Diagnosis not present

## 2023-05-03 DIAGNOSIS — R2681 Unsteadiness on feet: Secondary | ICD-10-CM | POA: Diagnosis not present

## 2023-05-05 DIAGNOSIS — R2681 Unsteadiness on feet: Secondary | ICD-10-CM | POA: Diagnosis not present

## 2023-05-10 DIAGNOSIS — R2681 Unsteadiness on feet: Secondary | ICD-10-CM | POA: Diagnosis not present

## 2023-05-12 DIAGNOSIS — R2681 Unsteadiness on feet: Secondary | ICD-10-CM | POA: Diagnosis not present

## 2023-05-19 DIAGNOSIS — R2681 Unsteadiness on feet: Secondary | ICD-10-CM | POA: Diagnosis not present

## 2023-05-24 DIAGNOSIS — R2681 Unsteadiness on feet: Secondary | ICD-10-CM | POA: Diagnosis not present

## 2023-05-25 ENCOUNTER — Telehealth: Payer: Self-pay

## 2023-05-25 NOTE — Telephone Encounter (Signed)
 Copied from CRM [id number]. Topic: General - Other >> May 25, 2023  1:22 PM Martinique E wrote: Reason for CRM: Lauro Portal from [facility] called in stating that they faxed over OT orders for the patient on 5/1 and have not heard back regarding this. Callback number for Lauro Portal is [phone] and would like to speak with PCP's nurse.

## 2023-05-26 DIAGNOSIS — R2681 Unsteadiness on feet: Secondary | ICD-10-CM | POA: Diagnosis not present

## 2023-05-26 NOTE — Telephone Encounter (Signed)
 Patient has not been seen by the provider in over a year and provider is unable to sign this until patient is seen

## 2023-05-31 DIAGNOSIS — R2681 Unsteadiness on feet: Secondary | ICD-10-CM | POA: Diagnosis not present

## 2023-06-02 DIAGNOSIS — R2681 Unsteadiness on feet: Secondary | ICD-10-CM | POA: Diagnosis not present

## 2023-06-07 DIAGNOSIS — R2681 Unsteadiness on feet: Secondary | ICD-10-CM | POA: Diagnosis not present

## 2023-06-09 DIAGNOSIS — R2681 Unsteadiness on feet: Secondary | ICD-10-CM | POA: Diagnosis not present

## 2023-06-14 DIAGNOSIS — R2681 Unsteadiness on feet: Secondary | ICD-10-CM | POA: Diagnosis not present

## 2023-06-16 ENCOUNTER — Telehealth: Payer: Self-pay | Admitting: Internal Medicine

## 2023-06-16 DIAGNOSIS — R2681 Unsteadiness on feet: Secondary | ICD-10-CM | POA: Diagnosis not present

## 2023-06-16 NOTE — Telephone Encounter (Signed)
 Paperwork for OT/PT has been received and placed in provider's box up front.

## 2023-06-16 NOTE — Telephone Encounter (Signed)
 Copied from CRM [id number]. Topic: Clinical - Medical Advice >> Jun 16, 2023 11:54 AM Chuck Crater wrote: Reason for CRM: Susie stated that patient's physical therapist said that patient has been coughing a lot and they want to speak with nurse before doing anymore physical therapy.  Sara Lee  phone number [phone]

## 2023-06-17 NOTE — Telephone Encounter (Signed)
 Ok to call to get more information

## 2023-06-21 DIAGNOSIS — R2681 Unsteadiness on feet: Secondary | ICD-10-CM | POA: Diagnosis not present

## 2023-06-23 ENCOUNTER — Other Ambulatory Visit (HOSPITAL_COMMUNITY): Payer: Self-pay | Admitting: Internal Medicine

## 2023-06-23 DIAGNOSIS — R2681 Unsteadiness on feet: Secondary | ICD-10-CM | POA: Diagnosis not present

## 2023-06-24 ENCOUNTER — Other Ambulatory Visit (HOSPITAL_COMMUNITY): Payer: Self-pay

## 2023-06-24 ENCOUNTER — Other Ambulatory Visit (HOSPITAL_COMMUNITY): Payer: Self-pay | Admitting: Internal Medicine

## 2023-06-24 DIAGNOSIS — I5022 Chronic systolic (congestive) heart failure: Secondary | ICD-10-CM

## 2023-06-24 MED ORDER — APIXABAN 2.5 MG PO TABS: 2.5000 mg | ORAL_TABLET | Freq: Two times a day (BID) | ORAL | 1 refills | Status: AC

## 2023-06-24 NOTE — Telephone Encounter (Signed)
 We would not be able to order a new study without a visit okay to schedule virtual with any provider

## 2023-06-27 ENCOUNTER — Other Ambulatory Visit: Payer: Self-pay | Admitting: Internal Medicine

## 2023-06-28 DIAGNOSIS — R2681 Unsteadiness on feet: Secondary | ICD-10-CM | POA: Diagnosis not present

## 2023-06-29 ENCOUNTER — Encounter: Payer: Self-pay | Admitting: Family Medicine

## 2023-06-29 ENCOUNTER — Ambulatory Visit: Admitting: Family Medicine

## 2023-06-29 ENCOUNTER — Ambulatory Visit (INDEPENDENT_AMBULATORY_CARE_PROVIDER_SITE_OTHER)

## 2023-06-29 ENCOUNTER — Ambulatory Visit: Payer: Self-pay | Admitting: Family Medicine

## 2023-06-29 VITALS — BP 120/74 | HR 65 | Temp 97.8°F | Ht 71.0 in | Wt 159.0 lb

## 2023-06-29 DIAGNOSIS — R413 Other amnesia: Secondary | ICD-10-CM | POA: Diagnosis not present

## 2023-06-29 DIAGNOSIS — M6281 Muscle weakness (generalized): Secondary | ICD-10-CM

## 2023-06-29 DIAGNOSIS — Z7901 Long term (current) use of anticoagulants: Secondary | ICD-10-CM

## 2023-06-29 DIAGNOSIS — J90 Pleural effusion, not elsewhere classified: Secondary | ICD-10-CM | POA: Diagnosis not present

## 2023-06-29 DIAGNOSIS — I482 Chronic atrial fibrillation, unspecified: Secondary | ICD-10-CM

## 2023-06-29 DIAGNOSIS — R600 Localized edema: Secondary | ICD-10-CM

## 2023-06-29 DIAGNOSIS — I10 Essential (primary) hypertension: Secondary | ICD-10-CM | POA: Diagnosis not present

## 2023-06-29 DIAGNOSIS — R058 Other specified cough: Secondary | ICD-10-CM | POA: Diagnosis not present

## 2023-06-29 DIAGNOSIS — I5022 Chronic systolic (congestive) heart failure: Secondary | ICD-10-CM

## 2023-06-29 DIAGNOSIS — R1310 Dysphagia, unspecified: Secondary | ICD-10-CM

## 2023-06-29 DIAGNOSIS — R918 Other nonspecific abnormal finding of lung field: Secondary | ICD-10-CM | POA: Diagnosis not present

## 2023-06-29 DIAGNOSIS — R059 Cough, unspecified: Secondary | ICD-10-CM | POA: Diagnosis not present

## 2023-06-29 DIAGNOSIS — Z955 Presence of coronary angioplasty implant and graft: Secondary | ICD-10-CM | POA: Diagnosis not present

## 2023-06-29 LAB — TSH: TSH: 2.02 u[IU]/mL (ref 0.35–5.50)

## 2023-06-29 LAB — CBC WITH DIFFERENTIAL/PLATELET
Basophils Absolute: 0 10*3/uL (ref 0.0–0.1)
Basophils Relative: 0.5 % (ref 0.0–3.0)
Eosinophils Absolute: 0.1 10*3/uL (ref 0.0–0.7)
Eosinophils Relative: 2.1 % (ref 0.0–5.0)
HCT: 41.1 % (ref 39.0–52.0)
Hemoglobin: 13.9 g/dL (ref 13.0–17.0)
Lymphocytes Relative: 21.6 % (ref 12.0–46.0)
Lymphs Abs: 1.2 10*3/uL (ref 0.7–4.0)
MCHC: 33.8 g/dL (ref 30.0–36.0)
MCV: 90.4 fl (ref 78.0–100.0)
Monocytes Absolute: 0.7 10*3/uL (ref 0.1–1.0)
Monocytes Relative: 13.2 % — ABNORMAL HIGH (ref 3.0–12.0)
Neutro Abs: 3.4 10*3/uL (ref 1.4–7.7)
Neutrophils Relative %: 62.6 % (ref 43.0–77.0)
Platelets: 178 10*3/uL (ref 150.0–400.0)
RBC: 4.55 Mil/uL (ref 4.22–5.81)
RDW: 15.7 % — ABNORMAL HIGH (ref 11.5–15.5)
WBC: 5.4 10*3/uL (ref 4.0–10.5)

## 2023-06-29 LAB — COMPREHENSIVE METABOLIC PANEL WITH GFR
ALT: 31 U/L (ref 0–53)
AST: 36 U/L (ref 0–37)
Albumin: 4.2 g/dL (ref 3.5–5.2)
Alkaline Phosphatase: 135 U/L — ABNORMAL HIGH (ref 39–117)
BUN: 15 mg/dL (ref 6–23)
CO2: 31 meq/L (ref 19–32)
Calcium: 9.8 mg/dL (ref 8.4–10.5)
Chloride: 105 meq/L (ref 96–112)
Creatinine, Ser: 1.05 mg/dL (ref 0.40–1.50)
GFR: 60.6 mL/min (ref >60.00)
Glucose, Bld: 93 mg/dL (ref 70–99)
Potassium: 4 meq/L (ref 3.5–5.1)
Sodium: 142 meq/L (ref 135–145)
Total Bilirubin: 1.1 mg/dL (ref 0.2–1.2)
Total Protein: 7.1 g/dL (ref 6.0–8.3)

## 2023-06-29 LAB — VITAMIN B12: Vitamin B-12: 858 pg/mL (ref 211–911)

## 2023-06-29 LAB — D-DIMER, QUANTITATIVE: D-Dimer, Quant: 0.53 ug{FEU}/mL — ABNORMAL HIGH (ref <0.50)

## 2023-06-29 LAB — BRAIN NATRIURETIC PEPTIDE: Pro B Natriuretic peptide (BNP): 508 pg/mL — ABNORMAL HIGH (ref 0.0–100.0)

## 2023-06-29 MED ORDER — TORSEMIDE 20 MG PO TABS: 20.0000 mg | ORAL_TABLET | Freq: Every day | ORAL | 0 refills | Status: DC

## 2023-06-29 NOTE — Progress Notes (Signed)
 Subjective:     Patient ID: Larry Lopez, male    DOB: [birth year], 89 y.o.   MRN: [id number]  Chief Complaint  Patient presents with   Orders    Needs to get some physician orders completed as well as a swallow test    HPI  History of Present Illness         Here with his wife.  Living at [facility].   Requesting swallow eval due to cough with eating. He has paperwork from his assisted living facility to sign off on OT eval and treat and swallow study with speech eval.   Left foot has been swollen for the past few months. States he saw his cardiologist for this and was told swelling not r/t heart.   Hx of A-fib on Eliquis  and CHF.  Taking hydrochlorothiazide  12.5 mg   No sign of bleeding.   Memory worsening per wife. Takes donepezil .   Denies falling but wife states he has been slower and weaker overall. States he shuffles his feet when he gets tired.       Health Maintenance Due  Topic Date Due   DTaP/Tdap/Td (2 - Td or Tdap) 08/18/2020   Medicare Annual Wellness (AWV)  05/24/2023    Past Medical History:  Diagnosis Date   Atrial fibrillation (HCC)    Bradycardia    CAD (coronary artery disease)    Glaucoma    HLD (hyperlipidemia)    HTN (hypertension)    Tachy-brady syndrome (HCC)     Past Surgical History:  Procedure Laterality Date   CARDIAC SURGERY     CARDIOVERSION N/A 02/06/2013   Procedure: CARDIOVERSION;  Surgeon: Mardell Shade, MD;  Location: [facility] ENDOSCOPY;  Service: Cardiovascular;  Laterality: N/A;   CORONARY ARTERY BYPASS GRAFT     TONSILLECTOMY AND ADENOIDECTOMY      Family History  Problem Relation Age of Onset   Cancer Father        Deceased, 44   Breast cancer Mother        Deceased, 79   Breast cancer Sister    Colon cancer Daughter        Living   Diabetes Neg Hx    Coronary artery disease Neg Hx     Social History   Socioeconomic History   Marital status: Married    Spouse name: Not on file    Number of children: 2   Years of education: Not on file   Highest education level: Not on file  Occupational History   Occupation: accountant/ Retired  Tobacco Use   Smoking status: Former    Current packs/day: 0.00    Types: Cigarettes    Quit date: 01/12/1967    Years since quitting: 56.4   Smokeless tobacco: Never  Vaping Use   Vaping status: Never Used  Substance and Sexual Activity   Alcohol use: Yes    Alcohol/week: 28.0 standard drinks of alcohol    Types: 28 Standard drinks or equivalent per week    Comment: .5 bottle of wine daily   Drug use: No   Sexual activity: Yes  Other Topics Concern   Not on file  Social History Narrative   HSG, [employer]. 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 Has weathered the recession OK with some adjustments. Advanced Care planning - patient does not want  cardiac resuscitation - provided a signed out of facility order for home display (Aug '12) and did review the MOST form, providing an unsigned copy to be used for home discussion with report back.          Social Drivers of Corporate investment banker Strain: Low Risk  (05/24/2022)   Overall Financial Resource Strain (CARDIA)    Difficulty of Paying Living Expenses: Not hard at all  Food Insecurity: No Food Insecurity (05/24/2022)   Hunger Vital Sign    Worried About Running Out of Food in the Last Year: Never true    Ran Out of Food in the Last Year: Never true  Transportation Needs: No Transportation Needs (05/24/2022)   PRAPARE - Administrator, Civil Service (Medical): No    Lack of Transportation (Non-Medical): No  Physical Activity: Insufficiently Active (05/24/2022)   Exercise Vital Sign    Days of Exercise per Week: 2 days    Minutes of Exercise per Session: 10 min  Stress: No Stress Concern Present (05/24/2022)    [employer] of Occupational Health - Occupational Stress Questionnaire    Feeling of Stress : Only a little  Social Connections: Socially Integrated (05/24/2022)   Social Connection and Isolation Panel    Frequency of Communication with Friends and Family: More than three times a week    Frequency of Social Gatherings with Friends and Family: Once a week    Attends Religious Services: More than 4 times per year    Active Member of [employer] or Organizations: Yes    Attends Engineer, structural: More than 4 times per year    Marital Status: Married  Catering manager Violence: Not At Risk (05/24/2022)   Humiliation, Afraid, Rape, and Kick questionnaire    Fear of Current or Ex-Partner: No    Emotionally Abused: No    Physically Abused: No    Sexually Abused: No    Outpatient Medications Prior to Visit  Medication Sig Dispense Refill   apixaban  (ELIQUIS ) 2.5 MG TABS tablet Take 1 tablet (2.5 mg total) by mouth 2 (two) times daily. 180 tablet 1   atorvastatin  (LIPITOR) 40 MG tablet TAKE ONE TABLET AT BEDTIME 90 tablet 0   calcium  carbonate (OSCAL) 1500 (600 Ca) MG TABS tablet Take 1,500 mg by mouth daily.     Emollient (CERAVE) CREA Apply 1 Application topically daily as needed (dry , itchy scalp). Applied to scalp as needed     famotidine (PEPCID) 20 MG tablet Take 20 mg by mouth daily as needed for heartburn or indigestion. OTC     Ginger, Zingiber officinalis, (GINGER PO) See admin instructions. Chew a small portion of ginger root as needed for indigestion     metoprolol  tartrate (LOPRESSOR ) 25 MG tablet TAKE 1 TABLET BY MOUTH ONCE DAILY AS NEEDED. ONLY  USES  IF  NEEDED  FOR  TACHYCARDIA.  HAS  NOT  NEEDED  IN  OVER  A  YEAR 90 tablet 0   Multiple Vitamin (MULTI-VITAMIN) tablet Take 1 tablet by mouth daily.     Multiple Vitamins-Minerals (PRESERVISION AREDS 2) CAPS Take 1 capsule by mouth daily.     nitroGLYCERIN  (NITROSTAT ) 0.4 MG SL tablet PLACE ONE TABLET UNDER THE TONGUE  EVERY 5 MINUTES AS NEEDED FOR CHEST PAIN 25 tablet 1   timolol  (BETIMOL ) 0.5 % ophthalmic solution Place 1 drop into both eyes 2 (two) times daily.     triamcinolone  cream (KENALOG ) 0.1 % APPLY  CREAM  TOPICALLY TO AFFECTED AREA TWICE DAILY 10 g 0   donepezil  (ARICEPT ) 10 MG tablet Take 1 tablet (10 mg total) by mouth at bedtime. 90 tablet 0   hydrochlorothiazide  (HYDRODIURIL ) 12.5 MG tablet Take 1 tablet (12.5 mg total) by mouth daily. 90 tablet 3   tamsulosin  (FLOMAX ) 0.4 MG CAPS capsule Take 1 capsule (0.4 mg total) by mouth daily. 90 capsule 0   No facility-administered medications prior to visit.    No Known Allergies  Review of Systems  Constitutional:  Negative for chills, fever and malaise/fatigue.  HENT:  Negative for congestion, sinus pain and sore throat.   Eyes:  Negative for blurred vision and double vision.  Respiratory:  Positive for cough. Negative for sputum production, shortness of breath and wheezing.   Cardiovascular:  Negative for chest pain, palpitations and leg swelling.  Gastrointestinal:  Negative for abdominal pain, constipation, diarrhea, nausea and vomiting.  Genitourinary:  Negative for dysuria, frequency and urgency.  Musculoskeletal:  Negative for back pain, falls and neck pain.  Neurological:  Positive for weakness. Negative for dizziness, focal weakness and headaches.       Generalized muscle weakness   Psychiatric/Behavioral:  Positive for memory loss. Negative for substance abuse.        Objective:    Physical Exam Constitutional:      General: He is not in acute distress.    Appearance: He is not ill-appearing.  HENT:     Mouth/Throat:     Mouth: Mucous membranes are moist.     Pharynx: Oropharynx is clear.   Eyes:     Extraocular Movements: Extraocular movements intact.     Conjunctiva/sclera: Conjunctivae normal.    Cardiovascular:     Rate and Rhythm: Normal rate. Rhythm irregular.  Pulmonary:     Effort: Pulmonary effort is normal.      Breath sounds: Normal breath sounds.   Musculoskeletal:     Cervical back: Normal range of motion and neck supple. No tenderness.     Left lower leg: Edema present.  Lymphadenopathy:     Cervical: No cervical adenopathy.   Skin:    General: Skin is warm and dry.   Neurological:     General: No focal deficit present.     Mental Status: He is alert. Mental status is at baseline.     Cranial Nerves: No cranial nerve deficit.     Sensory: No sensory deficit.     Motor: No weakness.     Coordination: Coordination normal.     Gait: Gait normal.   Psychiatric:        Mood and Affect: Mood normal.        Behavior: Behavior normal.        Thought Content: Thought content normal.      BP 120/74 (BP Location: Left Arm, Patient Position: Sitting)   Pulse 65   Temp 97.8 F (36.6 C) (Temporal)   Ht 5' 11 (1.803 m)   Wt 159 lb (72.1 kg)   SpO2 98%   BMI 22.18 kg/m  Wt Readings from Last 3 Encounters:  06/29/23 159 lb (72.1 kg)  04/22/23 154 lb 6.4 oz (70 kg)  01/20/23 159 lb (72.1 kg)       Assessment & Plan:   Problem List Items Addressed This Visit     Anticoagulant long-term use   ATRIAL FIBRILLATION   Relevant Medications   torsemide (DEMADEX) 20 MG tablet   Chronic systolic CHF (congestive heart failure) (HCC)  Relevant Medications   torsemide (DEMADEX) 20 MG tablet   Other Relevant Orders   Brain natriuretic peptide (Completed)   Cough   Relevant Orders   Brain natriuretic peptide (Completed)   DG SWALLOW STUDY OP MEDICARE SPEECH PATH   DG Chest 2 View (Completed)   SLP modified barium swallow   Essential hypertension   Relevant Medications   torsemide (DEMADEX) 20 MG tablet   Other Relevant Orders   CBC with Differential/Platelet (Completed)   Comprehensive metabolic panel with GFR (Completed)   Memory change   Relevant Orders   CBC with Differential/Platelet (Completed)   Comprehensive metabolic panel with GFR (Completed)   Vitamin B12  (Completed)   TSH (Completed)   Swallowing impaired - Primary   Relevant Orders   DG SWALLOW STUDY OP MEDICARE SPEECH PATH   DG Chest 2 View (Completed)   SLP modified barium swallow   Other Visit Diagnoses       Leg edema, left       Relevant Orders   Brain natriuretic peptide (Completed)   D-dimer, quantitative (Completed)     Generalized muscle weakness          This is my first visit with this patient. Here with his wife. They live in an assisted living facility.  Paperwork signed for OT eval and treat as well as speech therapy and swallow eval.  Order placed.  Check labs due to LLE edema. On Eliquis   Persistent cough. Hx of CHF. Check X ray and labs including renal function, CBC, BNP and D-dimer.  Follow up pending results.   Addendum:  Chest X ray shows small pleural effusion and BNP is elevated.  Stop hydrochlorothiazide  and start torsemide 20 mg daily.  Follow up with PCP in 4 weeks and cardiology.    I have discontinued Mardeen Shackleton. Calamari's hydrochlorothiazide . I am also having him start on torsemide. Additionally, I am having him maintain his calcium  carbonate, (Ginger, Zingiber officinalis, (GINGER PO)), Multi-Vitamin, metoprolol  tartrate, triamcinolone  cream, CeraVe, famotidine, PreserVision AREDS 2, timolol , nitroGLYCERIN , atorvastatin , and apixaban .  Meds ordered this encounter  Medications   torsemide (DEMADEX) 20 MG tablet    Sig: Take 1 tablet (20 mg total) by mouth daily.    Dispense:  30 tablet    Refill:  0    Supervising Provider:   Bambi Lever A [[id number]]

## 2023-06-29 NOTE — Patient Instructions (Addendum)
 Please go downstairs for labs and a chest x-ray before you leave.  I ordered a swallow study and speech evaluation.  I also ordered OT evaluation and treatment.  I will be in touch with your results.  Please follow-up with Dr. Nicolette Barrio in the next 4 weeks.

## 2023-06-29 NOTE — Telephone Encounter (Signed)
 Patient has been scheduled with Alyson Back to have an updated swallow test done

## 2023-06-29 NOTE — Progress Notes (Signed)
 Please call patient and his wife and let him know that his labs indicate that his heart failure is slightly worse.  His x-ray shows a small amount of fluid on his lungs as well which is also related to heart failure.  I recommend changing his fluid pill.  Stop hydrochlorothiazide  and start the new medication I just sent to his pharmacy called torsemide.  His labs are fine otherwise.  Please make sure he has a follow-up appointment with Dr. Nicolette Barrio in the next 4 weeks.

## 2023-06-30 DIAGNOSIS — R2681 Unsteadiness on feet: Secondary | ICD-10-CM | POA: Diagnosis not present

## 2023-07-05 DIAGNOSIS — R2681 Unsteadiness on feet: Secondary | ICD-10-CM | POA: Diagnosis not present

## 2023-07-07 DIAGNOSIS — R2681 Unsteadiness on feet: Secondary | ICD-10-CM | POA: Diagnosis not present

## 2023-07-12 DIAGNOSIS — R2681 Unsteadiness on feet: Secondary | ICD-10-CM | POA: Diagnosis not present

## 2023-07-13 ENCOUNTER — Ambulatory Visit (HOSPITAL_COMMUNITY)
Admission: RE | Admit: 2023-07-13 | Discharge: 2023-07-13 | Disposition: A | Discharge status: Home / Self Care | Source: Ambulatory Visit | Attending: Internal Medicine | Admitting: Internal Medicine

## 2023-07-13 DIAGNOSIS — R1310 Dysphagia, unspecified: Secondary | ICD-10-CM | POA: Diagnosis present

## 2023-07-13 DIAGNOSIS — F0280 Dementia in other diseases classified elsewhere without behavioral disturbance: Secondary | ICD-10-CM | POA: Diagnosis not present

## 2023-07-13 DIAGNOSIS — R058 Other specified cough: Secondary | ICD-10-CM

## 2023-07-13 DIAGNOSIS — R638 Other symptoms and signs concerning food and fluid intake: Secondary | ICD-10-CM | POA: Diagnosis not present

## 2023-07-13 NOTE — Therapy (Signed)
 Modified Barium Swallow Study  Patient Details  Name: TADEN WITTER MRN: [id number] Date of Birth: [birth year]  Today's Date: 07/13/2023  Modified Barium Swallow completed.  Full report located under Chart Review in the Imaging Section.  History of Present Illness Larry Lopez is a 89 y.o. male with PMH: HTN, HLD, afib, bradycardia, ,CAD, glaucoma, tachy-brady syndrome. He presented to the his OP MBS secondary to c/o coughing when eating. His wife as well as caregiver from their facility ([facility]) came to the MBS and per his wife, he has been coughing for at least the past month. She reported that his appetite has not changed and he has neither put on nor lost weight. Apparently other residents at ALF have expressed concern when they observe Mr. Magill to be coughing when eating in the dining area.   Clinical Impression Khalee Mazo presents with an oropharyngeal swallow that appears largely [facility] for his age. With solids, he exhibited mastication delays and delayed anterior to posterior transit in oral cavity leading to part of bolus entering into pharynx with head of bolus near pyriform sinus before swallow was initiated. Pharyngeal phase of swallow was impacted from suspected cervical osteophytes as well as decreased anterior hyoid excursion and decreased duration of PES opening, leading to pharyngeal residuals in vallecular and pyriform sinus. A secondary swallow helped to clear majority of pharyngeal residuals. Penetration above the vocal cords (PAS 2) that exited laryngeal vestibule as well as penetration to the vocal cords (PAS 4) that exited laryngeal vestibule observed with thin liquids. Penetration was observed to occur during the swallow but also after the swallow from pyriform sinus residuals spilling over arytenoids and into laryngeal vestibule. No aspiration observed with any of the tested consistencies and no instances of penetration observed with honey thick, nectar thick, puree, barium tablet,  regular solids. SLP discussed results with Mr. Catanzaro, spouse and caregiver with recommendations to swallow twice with solid foods, take small sips with liquids and avoid foods that are too hard/tough. SLP also emphasized that his swallow appears WFL for his age and it is normal for him to have instances of coughing secondary to aging swallow; suggested educating other residents, family members and staff to this. If ongoing symptoms that negatively impact his PO intake, recommend considering HH SLP. Factors that may increase risk of adverse event in presence of aspiration Noe & Lianne 2021): Frail or deconditioned  Swallow Evaluation Recommendations Recommendations: PO diet PO Diet Recommendation: Regular;Dysphagia 3 (Mechanical soft);Thin liquids (Level 0) Liquid Administration via: Cup;Straw Medication Administration: Whole meds with liquid Supervision: Patient able to self-feed Swallowing strategies  : Slow rate;Small bites/sips;Minimize environmental distractions Postural changes: Position pt fully upright for meals      Norleen IVAR Blase, MA, CCC-SLP Speech Therapy

## 2023-07-14 DIAGNOSIS — R2681 Unsteadiness on feet: Secondary | ICD-10-CM | POA: Diagnosis not present

## 2023-07-17 NOTE — Progress Notes (Signed)
Please see results and advise. Thanks

## 2023-07-19 NOTE — Progress Notes (Signed)
 Please let patient and wife know that I spoke with Dr. Rollene and the swallow study results are not significantly changed and she does not recommend any changes at this point.  Please follow-up with Dr. Rollene for any new or worsening symptoms

## 2023-07-20 DIAGNOSIS — F0280 Dementia in other diseases classified elsewhere without behavioral disturbance: Secondary | ICD-10-CM | POA: Diagnosis not present

## 2023-07-21 DIAGNOSIS — R2681 Unsteadiness on feet: Secondary | ICD-10-CM | POA: Diagnosis not present

## 2023-07-22 DIAGNOSIS — F0280 Dementia in other diseases classified elsewhere without behavioral disturbance: Secondary | ICD-10-CM | POA: Diagnosis not present

## 2023-07-26 ENCOUNTER — Other Ambulatory Visit: Payer: Self-pay | Admitting: Internal Medicine

## 2023-07-26 DIAGNOSIS — R2681 Unsteadiness on feet: Secondary | ICD-10-CM | POA: Diagnosis not present

## 2023-07-26 NOTE — Telephone Encounter (Unsigned)
 Copied from CRM [id number]. Topic: Clinical - Medication Refill >> Jul 26, 2023  1:40 PM Henretta I wrote: Medication:  torsemide  (DEMADEX ) 20 MG tablet   Has the patient contacted their pharmacy? Yes, they contacted us  at patients request  (Agent: If no, request that the patient contact the pharmacy for the refill. If patient does not wish to contact the pharmacy document the reason why and proceed with request.) (Agent: If yes, when and what did the pharmacy advise?)  This is the patient's preferred pharmacy:  [facility] [place], [place] - [address] [place] [address] Phone: [phone] Fax: [phone]   Is this the correct pharmacy for this prescription? Yes If no, delete pharmacy and type the correct one.   Has the prescription been filled recently? No  Is the patient out of the medication? N/a pharmacy called on his behalf   Has the patient been seen for an appointment in the last year OR does the patient have an upcoming appointment? Yes  Can we respond through MyChart? Yes  Agent: Please be advised that Rx refills may take up to 3 business days. We ask that you follow-up with your pharmacy.

## 2023-07-27 ENCOUNTER — Ambulatory Visit: Admitting: Internal Medicine

## 2023-07-27 ENCOUNTER — Encounter: Payer: Self-pay | Admitting: Internal Medicine

## 2023-07-27 VITALS — BP 118/82 | HR 66 | Temp 97.8°F | Ht 71.0 in | Wt 156.0 lb

## 2023-07-27 DIAGNOSIS — R1310 Dysphagia, unspecified: Secondary | ICD-10-CM

## 2023-07-27 DIAGNOSIS — N400 Enlarged prostate without lower urinary tract symptoms: Secondary | ICD-10-CM

## 2023-07-27 DIAGNOSIS — I4821 Permanent atrial fibrillation: Secondary | ICD-10-CM

## 2023-07-27 DIAGNOSIS — R413 Other amnesia: Secondary | ICD-10-CM | POA: Diagnosis not present

## 2023-07-27 DIAGNOSIS — F0280 Dementia in other diseases classified elsewhere without behavioral disturbance: Secondary | ICD-10-CM | POA: Diagnosis not present

## 2023-07-27 MED ORDER — OXYBUTYNIN CHLORIDE ER 10 MG PO TB24: 10.0000 mg | ORAL_TABLET | Freq: Every day | ORAL | 1 refills | Status: DC

## 2023-07-27 NOTE — Telephone Encounter (Unsigned)
 Copied from CRM [id number]. Topic: Clinical - Medication Refill >> Jul 27, 2023 12:02 PM Burnard DEL wrote: Medication: torsemide  (DEMADEX ) 20 MG tablet  Has the patient contacted their pharmacy? Yes (Agent: If no, request that the patient contact the pharmacy for the refill. If patient does not wish to contact the pharmacy document the reason why and proceed with request.) (Agent: If yes, when and what did the pharmacy advise?)  This is the patient's preferred pharmacy:  [facility] [place], [place] - [address] [place] [address] Phone: [phone] Fax: [phone]    Is this the correct pharmacy for this prescription? Yes If no, delete pharmacy and type the correct one.   Has the prescription been filled recently? No  Is the patient out of the medication? Yes  Has the patient been seen for an appointment in the last year OR does the patient have an upcoming appointment? Yes  Can we respond through MyChart? Yes  Agent: Please be advised that Rx refills may take up to 3 business days. We ask that you follow-up with your pharmacy.

## 2023-07-27 NOTE — Progress Notes (Unsigned)
   Subjective:   Patient ID: Larry Lopez, male    DOB: 07-14-1926, 89 y.o.   MRN: [id number]  HPI The patient is a 89 YO man coming in for medical management and worsening urinary incontinence.   Review of Systems  Constitutional:  Positive for activity change.  HENT: Negative.    Eyes: Negative.   Respiratory:  Negative for cough, chest tightness and shortness of breath.   Cardiovascular:  Negative for chest pain, palpitations and leg swelling.  Gastrointestinal:  Negative for abdominal distention, abdominal pain, constipation, diarrhea, nausea and vomiting.  Genitourinary:  Positive for frequency and urgency.  Musculoskeletal: Negative.   Skin: Negative.   Neurological: Negative.        Memory change  Psychiatric/Behavioral: Negative.      Objective:  Physical Exam Constitutional:      Appearance: He is well-developed.  HENT:     Head: Normocephalic and atraumatic.  Cardiovascular:     Rate and Rhythm: Normal rate and regular rhythm.  Pulmonary:     Effort: Pulmonary effort is normal. No respiratory distress.     Breath sounds: Normal breath sounds. No wheezing or rales.  Abdominal:     General: Bowel sounds are normal. There is no distension.     Palpations: Abdomen is soft.     Tenderness: There is no abdominal tenderness. There is no rebound.  Musculoskeletal:        General: Tenderness present.     Cervical back: Normal range of motion.  Skin:    General: Skin is warm and dry.  Neurological:     Mental Status: He is alert. Mental status is at baseline.     Coordination: Coordination normal.     Vitals:   07/27/23 1043  BP: 118/82  Pulse: 66  Temp: 97.8 F (36.6 C)  TempSrc: Oral  SpO2: 97%  Weight: 156 lb (70.8 kg)  Height: 5' 11 (1.803 m)    Assessment & Plan:

## 2023-07-27 NOTE — Patient Instructions (Signed)
 We have sent in oxybutynin  to take 1 pill daily for the bladder.

## 2023-07-28 DIAGNOSIS — H6123 Impacted cerumen, bilateral: Secondary | ICD-10-CM | POA: Diagnosis not present

## 2023-07-28 DIAGNOSIS — H903 Sensorineural hearing loss, bilateral: Secondary | ICD-10-CM | POA: Diagnosis not present

## 2023-07-28 DIAGNOSIS — R2681 Unsteadiness on feet: Secondary | ICD-10-CM | POA: Diagnosis not present

## 2023-07-28 MED ORDER — TORSEMIDE 20 MG PO TABS: 20.0000 mg | ORAL_TABLET | Freq: Every day | ORAL | 0 refills | Status: DC

## 2023-07-28 NOTE — Assessment & Plan Note (Signed)
 Stable dementia symptoms and we discussed potential change with oxybutynin  in long term course.

## 2023-07-28 NOTE — Assessment & Plan Note (Signed)
 Taking eliquis  for anticoagulation and rate controlled off meds. Sounds regular today EKG not done we have decided not to do monitoring of this.

## 2023-07-28 NOTE — Assessment & Plan Note (Signed)
 We did review and discuss intermittent aspiration which he had previously been known to have. We had decided within our goals of care to not address further and allow eating and drinking per patient preference. He had recent additional swallow test which was consistent with prior. Reminded about chin tuck with eating but given his dementia he is not able to do this consistently without cuing and there is not someone to help cue at meals.

## 2023-07-28 NOTE — Assessment & Plan Note (Signed)
 Is on flomax  and tried gemtesa recently. We are trying oxybutynin  10 mg xl. We discussed with patient and daughter that this can sometimes worsen cognition but the benefit will likely outweigh the risk. He is a fall hazard getting up every 30 minutes at night to urinate and often during the day as well.

## 2023-07-29 ENCOUNTER — Telehealth: Payer: Self-pay

## 2023-07-29 DIAGNOSIS — F0280 Dementia in other diseases classified elsewhere without behavioral disturbance: Secondary | ICD-10-CM | POA: Diagnosis not present

## 2023-07-29 NOTE — Telephone Encounter (Signed)
 Copied from CRM [id number]. Topic: Clinical - Medical Advice >> Jul 29, 2023  4:18 PM Thersia BROCKS wrote: Reason for CRM: Patient daughter called in regarding the pharmacy calling stating that the diuretic was refilled, was under the impression that he will no longer be taking that would like for Dr.Crawford or her nurses to call back to confirm

## 2023-08-02 DIAGNOSIS — R2681 Unsteadiness on feet: Secondary | ICD-10-CM | POA: Diagnosis not present

## 2023-08-02 NOTE — Telephone Encounter (Signed)
 I do not see this on his medication list no more not sure as to why it was refilled and I do not see this medication on her notes from last visit.

## 2023-08-02 NOTE — Telephone Encounter (Signed)
 Copied from CRM [id number]. Topic: Clinical - Medical Advice >> Aug 02, 2023 10:19 AM Chasity T wrote: Reason for CRM: Devere daughter of patient is calling in because she stats that she was confused on if her father needs to continue the diuretic as discussed because it was recently refilled. She wanting for someone to give her a call back to confirm medications he needs to take.

## 2023-08-03 DIAGNOSIS — F0280 Dementia in other diseases classified elsewhere without behavioral disturbance: Secondary | ICD-10-CM | POA: Diagnosis not present

## 2023-08-03 NOTE — Telephone Encounter (Addendum)
 Please advise as I dont see this on patient medication list so I'm assuming he should no longer be taking this (Diuretic)

## 2023-08-04 DIAGNOSIS — R2681 Unsteadiness on feet: Secondary | ICD-10-CM | POA: Diagnosis not present

## 2023-08-05 DIAGNOSIS — F0280 Dementia in other diseases classified elsewhere without behavioral disturbance: Secondary | ICD-10-CM | POA: Diagnosis not present

## 2023-08-12 ENCOUNTER — Telehealth: Payer: Self-pay

## 2023-08-12 NOTE — Telephone Encounter (Signed)
 Copied from CRM [id number]. Topic: Clinical - Medical Advice >> Jul 29, 2023  4:18 PM Thersia C wrote: Reason for CRM: Patient daughter called in regarding the pharmacy calling stating that the diuretic was refilled, was under the impression that he will no longer be taking that would like for Dr.Crawford or her nurses to call back to confirm    I did not see this medication on patients list and the daughter wants to know if mr Spiker suppose to still be taking this medication?

## 2023-08-12 NOTE — Telephone Encounter (Signed)
 I don't see any question can you clarify?

## 2023-08-12 NOTE — Telephone Encounter (Signed)
 I don't see a name of a medication. What medicine are we talking about?

## 2023-08-16 DIAGNOSIS — R2681 Unsteadiness on feet: Secondary | ICD-10-CM | POA: Diagnosis not present

## 2023-08-16 DIAGNOSIS — F0280 Dementia in other diseases classified elsewhere without behavioral disturbance: Secondary | ICD-10-CM | POA: Diagnosis not present

## 2023-08-18 DIAGNOSIS — R2681 Unsteadiness on feet: Secondary | ICD-10-CM | POA: Diagnosis not present

## 2023-08-19 ENCOUNTER — Other Ambulatory Visit: Payer: Self-pay | Admitting: Internal Medicine

## 2023-08-19 DIAGNOSIS — F0280 Dementia in other diseases classified elsewhere without behavioral disturbance: Secondary | ICD-10-CM | POA: Diagnosis not present

## 2023-08-23 DIAGNOSIS — R2681 Unsteadiness on feet: Secondary | ICD-10-CM | POA: Diagnosis not present

## 2023-08-24 ENCOUNTER — Other Ambulatory Visit: Payer: Self-pay | Admitting: Internal Medicine

## 2023-08-25 ENCOUNTER — Ambulatory Visit: Payer: Self-pay

## 2023-08-25 ENCOUNTER — Other Ambulatory Visit: Payer: Self-pay

## 2023-08-25 DIAGNOSIS — R2681 Unsteadiness on feet: Secondary | ICD-10-CM | POA: Diagnosis not present

## 2023-08-25 MED ORDER — TORSEMIDE 20 MG PO TABS: 20.0000 mg | ORAL_TABLET | Freq: Every day | ORAL | 0 refills | Status: DC

## 2023-08-25 NOTE — Telephone Encounter (Signed)
 FYI Only or Action Required?: Action required by provider: medication refill request.  Patient was last seen in primary care on 07/27/2023 by Rollene Almarie LABOR, MD.  Called Nurse Triage reporting Medication Refill - see note.    Triage Disposition: Call PCP When Office is Open  Patient/caregiver understands and will follow disposition?: Yes      Copied from CRM [id number]. Topic: Clinical - Prescription Issue >> Aug 25, 2023  1:09 PM Larry Lopez wrote: Reason for CRM: Patient daughter stated that father really needs torsemide  (DEMADEX ) 20 MG tablet or his legs will swell. Larry never received a call back from [facility] from 08/12/23. Prescription was refused on 08/13. >> Aug 25, 2023  1:29 PM Larry Lopez wrote: Larry is still waiting for a callback about patient medication torsemide  (DEMADEX ) 20 MG tablet [[id number]]  he will run out of his medication and was denied for a refill.  Reason for Disposition  [1] Caller has NON-URGENT medicine question about med that doctor (or NP/PA) prescribed AND [2] triager unable to answer question  Answer Assessment - Initial Assessment Questions 1. DRUG NAME: What medicine do you need to have refilled?     torsemide  (DEMADEX ) 20 MG tablet 2. REFILLS REMAINING: How many refills are remaining? Notes: The label on the medicine or pill bottle will show how many refills are remaining. If there are no refills remaining, then a renewal may be needed.     0 3. EXPIRATION DATE: What is the expiration date? Note: The label states when the prescription will expire, and thus can no longer be refilled.)     0 4. PRESCRIBER: Who prescribed it? Note: The prescribing doctor or group is responsible for refill approvals.SABRA Rollene Almarie LABOR, MD 5. PHARMACY: Have you contacted your pharmacy (drugstore)? Note: Some pharmacies will contact the doctor (or NP/PA).      N/a 6. SYMPTOMS: Do you have any symptoms?     No current sx Last month having feet  swelling d/t discontinuing torsemide  (DEMADEX ) 20 MG tablet d/t increased urinary frequency. Daughter reports that there was confusion with diuretics and new medication (oxybutin?). Daughter wanted to confirm PCP was able to reorder diuretic-- triager verified that Rx was sent today. 7. PREGNANCY: Is there any chance that you are pregnant? When was your last menstrual period?     N/a    Of note, daughter Larry requesting 2 month supply of torsemide  (DEMADEX ) d/t her [place] primary caregiver [place]. Larry would prefer to be present with pt during appts.  Triager will forward encounter for Dr. Rollene 's office to review and advise. Caregiver verbalized understanding and is expecting call back [phone]) from office if PCP able to accommodate 2 month refill.  Protocols used: Medication Refill and Renewal Call-A-AH

## 2023-08-26 NOTE — Telephone Encounter (Signed)
 I had responded back to the patient through my chart and yesterday pt daughter had called in due to the medication being denied. I did send in refill while phone call was taken by [facility] on 08/25/2023

## 2023-08-30 DIAGNOSIS — R2681 Unsteadiness on feet: Secondary | ICD-10-CM | POA: Diagnosis not present

## 2023-08-31 DIAGNOSIS — F0280 Dementia in other diseases classified elsewhere without behavioral disturbance: Secondary | ICD-10-CM | POA: Diagnosis not present

## 2023-09-02 DIAGNOSIS — F0280 Dementia in other diseases classified elsewhere without behavioral disturbance: Secondary | ICD-10-CM | POA: Diagnosis not present

## 2023-09-06 DIAGNOSIS — H6123 Impacted cerumen, bilateral: Secondary | ICD-10-CM | POA: Diagnosis not present

## 2023-09-07 DIAGNOSIS — F0280 Dementia in other diseases classified elsewhere without behavioral disturbance: Secondary | ICD-10-CM | POA: Diagnosis not present

## 2023-09-12 ENCOUNTER — Other Ambulatory Visit: Payer: Self-pay | Admitting: Internal Medicine

## 2023-09-14 DIAGNOSIS — F0280 Dementia in other diseases classified elsewhere without behavioral disturbance: Secondary | ICD-10-CM | POA: Diagnosis not present

## 2023-09-15 ENCOUNTER — Other Ambulatory Visit (HOSPITAL_COMMUNITY): Payer: Self-pay | Admitting: Internal Medicine

## 2023-09-16 DIAGNOSIS — F0280 Dementia in other diseases classified elsewhere without behavioral disturbance: Secondary | ICD-10-CM | POA: Diagnosis not present

## 2023-09-17 ENCOUNTER — Other Ambulatory Visit: Payer: Self-pay | Admitting: Internal Medicine

## 2023-09-19 ENCOUNTER — Other Ambulatory Visit: Payer: Self-pay | Admitting: Internal Medicine

## 2023-10-04 DIAGNOSIS — H04123 Dry eye syndrome of bilateral lacrimal glands: Secondary | ICD-10-CM | POA: Diagnosis not present

## 2023-10-04 DIAGNOSIS — H401132 Primary open-angle glaucoma, bilateral, moderate stage: Secondary | ICD-10-CM | POA: Diagnosis not present

## 2023-10-10 ENCOUNTER — Other Ambulatory Visit: Payer: Self-pay | Admitting: Internal Medicine

## 2023-10-13 DIAGNOSIS — Z23 Encounter for immunization: Secondary | ICD-10-CM | POA: Diagnosis not present

## 2023-10-14 ENCOUNTER — Other Ambulatory Visit: Payer: Self-pay | Admitting: Internal Medicine

## 2023-10-19 ENCOUNTER — Telehealth: Payer: Self-pay

## 2023-10-25 ENCOUNTER — Telehealth: Payer: Self-pay

## 2023-10-25 NOTE — Telephone Encounter (Signed)
 Forms from [employer] have been sent back successfully on 10/24/2023

## 2023-10-25 NOTE — Telephone Encounter (Signed)
 Form was fax back successfully

## 2023-10-28 DIAGNOSIS — Z974 Presence of external hearing-aid: Secondary | ICD-10-CM | POA: Diagnosis not present

## 2023-10-28 DIAGNOSIS — H6123 Impacted cerumen, bilateral: Secondary | ICD-10-CM | POA: Diagnosis not present

## 2023-11-05 ENCOUNTER — Other Ambulatory Visit: Payer: Self-pay | Admitting: Internal Medicine

## 2023-11-09 DIAGNOSIS — R2681 Unsteadiness on feet: Secondary | ICD-10-CM | POA: Diagnosis not present

## 2023-11-09 DIAGNOSIS — M6281 Muscle weakness (generalized): Secondary | ICD-10-CM | POA: Diagnosis not present

## 2023-11-11 DIAGNOSIS — M6281 Muscle weakness (generalized): Secondary | ICD-10-CM | POA: Diagnosis not present

## 2023-11-11 DIAGNOSIS — R2681 Unsteadiness on feet: Secondary | ICD-10-CM | POA: Diagnosis not present

## 2023-11-14 DIAGNOSIS — L821 Other seborrheic keratosis: Secondary | ICD-10-CM | POA: Diagnosis not present

## 2023-11-14 DIAGNOSIS — Z85828 Personal history of other malignant neoplasm of skin: Secondary | ICD-10-CM | POA: Diagnosis not present

## 2023-11-15 DIAGNOSIS — M6281 Muscle weakness (generalized): Secondary | ICD-10-CM | POA: Diagnosis not present

## 2023-11-15 DIAGNOSIS — R2681 Unsteadiness on feet: Secondary | ICD-10-CM | POA: Diagnosis not present

## 2023-11-17 DIAGNOSIS — R2681 Unsteadiness on feet: Secondary | ICD-10-CM | POA: Diagnosis not present

## 2023-11-17 DIAGNOSIS — M6281 Muscle weakness (generalized): Secondary | ICD-10-CM | POA: Diagnosis not present

## 2023-11-22 DIAGNOSIS — M6281 Muscle weakness (generalized): Secondary | ICD-10-CM | POA: Diagnosis not present

## 2023-11-22 DIAGNOSIS — R2681 Unsteadiness on feet: Secondary | ICD-10-CM | POA: Diagnosis not present

## 2023-11-24 DIAGNOSIS — M6281 Muscle weakness (generalized): Secondary | ICD-10-CM | POA: Diagnosis not present

## 2023-11-24 DIAGNOSIS — R2681 Unsteadiness on feet: Secondary | ICD-10-CM | POA: Diagnosis not present

## 2023-11-25 DIAGNOSIS — M6281 Muscle weakness (generalized): Secondary | ICD-10-CM | POA: Diagnosis not present

## 2023-11-25 DIAGNOSIS — R2681 Unsteadiness on feet: Secondary | ICD-10-CM | POA: Diagnosis not present

## 2023-11-29 DIAGNOSIS — M6281 Muscle weakness (generalized): Secondary | ICD-10-CM | POA: Diagnosis not present

## 2023-11-29 DIAGNOSIS — R2681 Unsteadiness on feet: Secondary | ICD-10-CM | POA: Diagnosis not present

## 2023-12-01 DIAGNOSIS — M6281 Muscle weakness (generalized): Secondary | ICD-10-CM | POA: Diagnosis not present

## 2023-12-01 DIAGNOSIS — R2681 Unsteadiness on feet: Secondary | ICD-10-CM | POA: Diagnosis not present

## 2023-12-02 ENCOUNTER — Other Ambulatory Visit: Payer: Self-pay | Admitting: Internal Medicine

## 2023-12-02 DIAGNOSIS — R2681 Unsteadiness on feet: Secondary | ICD-10-CM | POA: Diagnosis not present

## 2023-12-02 DIAGNOSIS — M6281 Muscle weakness (generalized): Secondary | ICD-10-CM | POA: Diagnosis not present

## 2023-12-13 DIAGNOSIS — R2681 Unsteadiness on feet: Secondary | ICD-10-CM | POA: Diagnosis not present

## 2023-12-13 DIAGNOSIS — M6281 Muscle weakness (generalized): Secondary | ICD-10-CM | POA: Diagnosis not present

## 2023-12-20 DIAGNOSIS — M6281 Muscle weakness (generalized): Secondary | ICD-10-CM | POA: Diagnosis not present

## 2023-12-20 DIAGNOSIS — R2681 Unsteadiness on feet: Secondary | ICD-10-CM | POA: Diagnosis not present

## 2023-12-22 DIAGNOSIS — M6281 Muscle weakness (generalized): Secondary | ICD-10-CM | POA: Diagnosis not present

## 2023-12-22 DIAGNOSIS — R2681 Unsteadiness on feet: Secondary | ICD-10-CM | POA: Diagnosis not present

## 2023-12-23 ENCOUNTER — Ambulatory Visit: Payer: Self-pay

## 2023-12-23 DIAGNOSIS — M6281 Muscle weakness (generalized): Secondary | ICD-10-CM | POA: Diagnosis not present

## 2023-12-23 DIAGNOSIS — R2681 Unsteadiness on feet: Secondary | ICD-10-CM | POA: Diagnosis not present

## 2023-12-23 NOTE — Telephone Encounter (Signed)
 FYI Only or Action Required?: FYI only for provider: appointment scheduled on 12/15.  Patient was last seen in primary care on 07/27/2023 by Rollene Almarie LABOR, MD.  Called Nurse Triage reporting Urinary Frequency.  Symptoms began several months ago.  Interventions attempted: Nothing.  Symptoms are: unchanged.  Triage Disposition: See Physician Within 24 Hours  Patient/caregiver understands and will follow disposition?: Yes           Copied from CRM #[id number]. Topic: Clinical - Red Word Triage >> Dec 23, 2023 11:09 AM Larissa RAMAN wrote: [facility] that prompted transfer to Nurse Triage: frequent urination, burning and pain with urination. Reason for Disposition  Urinating more frequently than usual (i.e., frequency) OR new-onset of the feeling of an urgent need to urinate (i.e., urgency)  Answer Assessment - Initial Assessment Questions 1. SYMPTOM: What's the main symptom you're concerned about? (e.g., frequency, incontinence)     Frequency   2. ONSET: When did the  symptoms  start?  Several months      3. PAIN: Is there any pain? If Yes, ask: How bad is it? (Scale: 1-10; mild, moderate, severe)     No pain   4. CAUSE: What do you think is causing the symptoms?     Unsure  5. OTHER SYMPTOMS: Do you have any other symptoms? (e.g., blood in urine, fever, flank pain, pain with urination)   No    Home health nurse Sharlet Mace called in to report frequency with urination in patient. This has been ongoing for several months. She stated patient us  going multiple times per day. She stated he is on Lasix, and that may be the cause of the frequency. Increased confusion noted as well. Appointment scheduled for evaluation. HH Nurse and wife agree with plan of care, and will call back if anything changes, or if symptoms worsen.  Protocols used: Urinary Symptoms-A-AH

## 2023-12-26 ENCOUNTER — Ambulatory Visit: Admitting: Family Medicine

## 2023-12-26 ENCOUNTER — Encounter: Payer: Self-pay | Admitting: Family Medicine

## 2023-12-26 VITALS — BP 118/58 | HR 86 | Temp 97.7°F | Resp 18 | Ht 71.0 in | Wt 155.0 lb

## 2023-12-26 DIAGNOSIS — N3942 Incontinence without sensory awareness: Secondary | ICD-10-CM | POA: Diagnosis not present

## 2023-12-26 DIAGNOSIS — N400 Enlarged prostate without lower urinary tract symptoms: Secondary | ICD-10-CM

## 2023-12-26 DIAGNOSIS — I5022 Chronic systolic (congestive) heart failure: Secondary | ICD-10-CM

## 2023-12-26 LAB — POCT URINALYSIS DIPSTICK
Bilirubin, UA: NEGATIVE
Blood, UA: NEGATIVE
Glucose, UA: NEGATIVE
Ketones, UA: NEGATIVE
Leukocytes, UA: NEGATIVE
Nitrite, UA: NEGATIVE
Protein, UA: NEGATIVE
Spec Grav, UA: 1.015 (ref 1.010–1.025)
Urobilinogen, UA: NEGATIVE U/dL — AB
pH, UA: 5 (ref 5.0–8.0)

## 2023-12-26 NOTE — Assessment & Plan Note (Signed)
 Tamsulosin  increasing urinary frequency and incontinence at bedtime. - Moved tamsulosin  to early afternoon to reduce nocturia.

## 2023-12-26 NOTE — Assessment & Plan Note (Signed)
 Chronic heart failure managed with torsemide  for fluid retention. - Continue torsemide  in the morning for fluid management.

## 2023-12-26 NOTE — Patient Instructions (Addendum)
°  VISIT SUMMARY: Today, we discussed your ongoing issues with frequent urination and leakage, as well as your heart failure management.  YOUR PLAN: BENIGN PROSTATIC HYPERPLASIA WITH URINARY FREQUENCY AND INCONTINENCE: You have been experiencing frequent urination and leakage, which has been very inconvenient for you. -Move tamsulosin  to early afternoon to reduce nighttime urination. -Continue taking oxybutynin  in the evening for urinary leakage. -Consider using larger inserts in your Depends for better management of incontinence.  HEART FAILURE: Your heart failure is being managed with torsemide  to help with fluid retention. -Continue taking torsemide  in the morning for fluid management.  This is a picture of tamsulosin  (Flomax ) that you need to move out of bedtime and take in the early afternoon:

## 2023-12-26 NOTE — Progress Notes (Signed)
 Assessment & Plan Urinary incontinence without sensory awareness Chronic urinary frequency and incontinence likely exacerbated by bedtime tamsulosin , increasing nocturia. Oxybutynin  used for urinary leakage. Significant inconvenience reported due to frequent urination and incontinence. - Moved tamsulosin  to early afternoon to reduce nocturia. - Continue oxybutynin  in the evening for urinary leakage. - Consider larger inserts in Depends for better incontinence management. Orders:   POCT urinalysis dipstick = normal Results for orders placed or performed in visit on 12/26/23  POCT urinalysis dipstick  Result Value Ref Range   Color, UA yellow    Clarity, UA clear    Glucose, UA Negative Negative   Bilirubin, UA neg    Ketones, UA neg    Spec Grav, UA 1.015 1.010 - 1.025   Blood, UA neg    pH, UA 5.0 5.0 - 8.0   Protein, UA Negative Negative   Urobilinogen, UA negative (A) 0.2 or 1.0 E.U./dL   Nitrite, UA neg    Leukocytes, UA Negative Negative   Appearance     Odor     BPH without obstruction/lower urinary tract symptoms Tamsulosin  increasing urinary frequency and incontinence at bedtime. - Moved tamsulosin  to early afternoon to reduce nocturia.    Chronic systolic CHF (congestive heart failure) (HCC) Chronic heart failure managed with torsemide  for fluid retention. - Continue torsemide  in the morning for fluid management.      Follow up plan: Return if symptoms worsen or fail to improve.  Niki Rung, MSN, APRN, FNP-C  Subjective:  HPI: Larry Lopez is a 89 y.o. male presenting on 12/26/2023 for Urinary Frequency (Day and night frequency and incontinence. /Not a new problem, does take a fluid pill ) and sleeping too much  (Sleeps well at night and naps all day )  Discussed the use of AI scribe software for clinical note transcription with the patient, who gave verbal consent to proceed.  Patient is accompanied by his wife and caregiver, who he is okay with being  present.  He experiences urinary frequency, urinating every fifteen minutes, and uses seven of the thickest pull-ups available each day. Despite this, he has leakage that soils his pants and sheets, necessitating frequent washing. The symptoms have progressively worsened over time. He was previously seen in July and was prescribed oxybutynin . He takes these medications, oxybutynin  and tamsulosin , at bedtime. He also takes torsemide  in the morning due to swelling in his feet and ankles, which resolved with the addition of torsemide . He has a history of heart failure and has undergone two open heart surgeries in the past.       ROS: Negative unless specifically indicated above in HPI.   Relevant past medical history reviewed and updated as indicated.   Allergies and medications reviewed and updated.  Current Medications[1]  Allergies[2]  Objective:   BP (!) 118/58   Pulse 86   Temp 97.7 F (36.5 C)   Resp 18   Ht 5' 11 (1.803 m)   Wt 155 lb (70.3 kg)   SpO2 99%   BMI 21.62 kg/m    Physical Exam Vitals reviewed.  Constitutional:      General: He is not in acute distress.    Appearance: Normal appearance. He is not ill-appearing, toxic-appearing or diaphoretic.  HENT:     Head: Normocephalic and atraumatic.  Eyes:     General: No scleral icterus.       Right eye: No discharge.        Left eye: No discharge.  Conjunctiva/sclera: Conjunctivae normal.  Cardiovascular:     Rate and Rhythm: Normal rate.  Pulmonary:     Effort: Pulmonary effort is normal. No respiratory distress.  Musculoskeletal:        General: Normal range of motion.     Cervical back: Normal range of motion.  Skin:    General: Skin is warm and dry.  Neurological:     Mental Status: He is alert and oriented to person, place, and time. Mental status is at baseline.  Psychiatric:        Mood and Affect: Mood normal.        Behavior: Behavior normal.        Thought Content: Thought content normal.         Judgment: Judgment normal.            [1]  Current Outpatient Medications:    apixaban  (ELIQUIS ) 2.5 MG TABS tablet, Take 1 tablet (2.5 mg total) by mouth 2 (two) times daily., Disp: 180 tablet, Rfl: 1   atorvastatin  (LIPITOR) 40 MG tablet, TAKE ONE TABLET BY MOUTH AT BEDTIME, Disp: 90 tablet, Rfl: 0   calcium  carbonate (OSCAL) 1500 (600 Ca) MG TABS tablet, Take 1,500 mg by mouth daily., Disp: , Rfl:    donepezil  (ARICEPT ) 10 MG tablet, TAKE ONE TABLET BY MOUTH AT BEDTIME, Disp: 90 tablet, Rfl: 0   Emollient (CERAVE) CREA, Apply 1 Application topically daily as needed (dry , itchy scalp). Applied to scalp as needed, Disp: , Rfl:    Ginger, Zingiber officinalis, (GINGER PO), See admin instructions. Chew a small portion of ginger root as needed for indigestion, Disp: , Rfl:    Multiple Vitamin (MULTI-VITAMIN) tablet, Take 1 tablet by mouth daily., Disp: , Rfl:    Multiple Vitamins-Minerals (PRESERVISION AREDS 2) CAPS, Take 1 capsule by mouth daily., Disp: , Rfl:    oxybutynin  (DITROPAN -XL) 10 MG 24 hr tablet, TAKE ONE TABLET BY MOUTH AT BEDTIME, Disp: 90 tablet, Rfl: 0   tamsulosin  (FLOMAX ) 0.4 MG CAPS capsule, TAKE ONE CAPSULE IN THE EVENING, Disp: 90 capsule, Rfl: 0   timolol  (BETIMOL ) 0.5 % ophthalmic solution, Place 1 drop into both eyes 2 (two) times daily., Disp: , Rfl:    torsemide  (DEMADEX ) 20 MG tablet, TAKE ONE TABLET BY MOUTH IN THE MORNING, Disp: 30 tablet, Rfl: 0   triamcinolone  cream (KENALOG ) 0.1 %, APPLY  CREAM TOPICALLY TO AFFECTED AREA TWICE DAILY, Disp: 10 g, Rfl: 0   nitroGLYCERIN  (NITROSTAT ) 0.4 MG SL tablet, PLACE ONE TABLET UNDER THE TONGUE EVERY 5 MINUTES AS NEEDED FOR CHEST PAIN (Patient not taking: Reported on 12/26/2023), Disp: 25 tablet, Rfl: 1 [2] No Known Allergies

## 2023-12-30 ENCOUNTER — Other Ambulatory Visit (HOSPITAL_COMMUNITY): Payer: Self-pay | Admitting: Internal Medicine

## 2023-12-30 ENCOUNTER — Other Ambulatory Visit: Payer: Self-pay | Admitting: Internal Medicine

## 2024-01-09 ENCOUNTER — Other Ambulatory Visit: Payer: Self-pay | Admitting: Internal Medicine

## 2024-01-13 ENCOUNTER — Telehealth: Payer: Self-pay | Admitting: Internal Medicine

## 2024-01-13 NOTE — Telephone Encounter (Signed)
 Copied from CRM [id number]. Topic: Clinical - Prescription Issue >> Jan 13, 2024 12:13 PM Macario HERO wrote: Reason for CRM: Warren from [facility] called regarding patient medication:donepezil  (ARICEPT ) 10 MG tablet [[id number]].  Call back: [phone] -

## 2024-01-24 NOTE — Telephone Encounter (Signed)
 [facility] and [facility] state this has been addressed, no action needed at this time

## 2024-01-27 ENCOUNTER — Other Ambulatory Visit: Payer: Self-pay | Admitting: Internal Medicine

## 2024-02-13 ENCOUNTER — Ambulatory Visit: Admitting: Internal Medicine

## 2024-02-15 ENCOUNTER — Encounter: Payer: Self-pay | Admitting: Emergency Medicine

## 2024-02-15 ENCOUNTER — Ambulatory Visit: Admitting: Emergency Medicine

## 2024-02-15 VITALS — BP 122/78 | HR 78 | Temp 97.3°F | Wt 153.0 lb

## 2024-02-15 DIAGNOSIS — R350 Frequency of micturition: Secondary | ICD-10-CM

## 2024-02-15 LAB — POCT URINALYSIS DIP (CLINITEK)
Bilirubin, UA: NEGATIVE
Blood, UA: NEGATIVE
Glucose, UA: NEGATIVE mg/dL
Ketones, POC UA: NEGATIVE mg/dL
Leukocytes, UA: NEGATIVE
Nitrite, UA: NEGATIVE
POC PROTEIN,UA: NEGATIVE
Spec Grav, UA: 1.015
Urobilinogen, UA: 0.2 U/dL
pH, UA: 6.5

## 2024-02-15 NOTE — Progress Notes (Signed)
 Larry Lopez 89 y.o.   Chief Complaint  Patient presents with   Dysuria    Pt reports frequent urination. Denies burning and blood in urine.     HISTORY OF PRESENT ILLNESS: Accompanied today by caretaker. This is a 89 y.o. male complaining of urinary frequency getting worse over the last several months. Presently on Ditropan  and tamsulosin  for lower urinary tract symptoms. Was able to follow-up with urologist last summer. No other associated symptoms. No other complaints or medical concerns today.  Dysuria  Associated symptoms include frequency. Pertinent negatives include no chills, hematuria, nausea or vomiting.     Prior to Admission medications  Medication Sig Start Date End Date Taking? Authorizing Provider  apixaban  (ELIQUIS ) 2.5 MG TABS tablet Take 1 tablet (2.5 mg total) by mouth 2 (two) times daily. 06/24/23  Yes Bensimhon, Toribio SAUNDERS, MD  atorvastatin  (LIPITOR) 40 MG tablet TAKE ONE TABLET BY MOUTH AT BEDTIME 12/30/23  Yes Bensimhon, Toribio SAUNDERS, MD  calcium  carbonate (OSCAL) 1500 (600 Ca) MG TABS tablet Take 1,500 mg by mouth daily.   Yes [provider]  donepezil  (ARICEPT ) 10 MG tablet TAKE ONE TABLET BY MOUTH AT BEDTIME 01/23/24  Yes Rollene Almarie LABOR, MD  Emollient (CERAVE) CREA Apply 1 Application topically daily as needed (dry , itchy scalp). Applied to scalp as needed   Yes [provider]  Ginger, Zingiber officinalis, (GINGER PO) See admin instructions. Chew a small portion of ginger root as needed for indigestion   Yes [provider]  Multiple Vitamin (MULTI-VITAMIN) tablet Take 1 tablet by mouth daily.   Yes [provider]  Multiple Vitamins-Minerals (PRESERVISION AREDS 2) CAPS Take 1 capsule by mouth daily.   Yes [provider]  nitroGLYCERIN  (NITROSTAT ) 0.4 MG SL tablet PLACE ONE TABLET UNDER THE TONGUE EVERY 5 MINUTES AS NEEDED FOR CHEST PAIN 01/24/23  Yes Bensimhon, Toribio SAUNDERS, MD  oxybutynin  (DITROPAN -XL) 10 MG  24 hr tablet TAKE ONE TABLET BY MOUTH AT BEDTIME 09/13/23  Yes Rollene Almarie LABOR, MD  tamsulosin  (FLOMAX ) 0.4 MG CAPS capsule TAKE ONE CAPSULE BY MOUTH IN THE EVENING 12/30/23  Yes Rollene Almarie LABOR, MD  timolol  (BETIMOL ) 0.5 % ophthalmic solution Place 1 drop into both eyes 2 (two) times daily.   Yes [provider]  torsemide  (DEMADEX ) 20 MG tablet TAKE ONE TABLET BY MOUTH IN THE MORNING 01/27/24  Yes Rollene Almarie LABOR, MD  triamcinolone  cream (KENALOG ) 0.1 % APPLY  CREAM TOPICALLY TO AFFECTED AREA TWICE DAILY 05/21/20  Yes Rollene Almarie LABOR, MD    Allergies[1]  Patient Active Problem List   Diagnosis Date Noted   Swallowing impaired 06/29/2023   Chronic systolic CHF (congestive heart failure) (HCC) 01/31/2022   BPH without obstruction/lower urinary tract symptoms 01/31/2022   Memory change 04/24/2020   Right inguinal hernia 09/05/2017   Cough 04/02/2015   Coronary artery disease involving native heart without angina pectoris 11/05/2013   Anatomical narrow angle borderline glaucoma 01/18/2011   Primary open angle glaucoma 01/18/2011   Senile nuclear sclerosis 01/18/2011   Routine health maintenance 08/22/2010   Hearing loss 08/22/2009   ATRIAL FIBRILLATION 06/27/2008   Mixed hyperlipidemia 12/09/2006   Essential hypertension 12/09/2006   MYOCARDIAL INFARCTION, HX OF 12/09/2006   CAD (coronary artery disease) 12/09/2006    Past Medical History:  Diagnosis Date   Atrial fibrillation (HCC)    Bradycardia    CAD (coronary artery disease)    Glaucoma    HLD (hyperlipidemia)    HTN (  hypertension)    Tachy-brady syndrome (HCC)     Past Surgical History:  Procedure Laterality Date   CARDIAC SURGERY     CARDIOVERSION N/A 02/06/2013   Procedure: CARDIOVERSION;  Surgeon: Toribio JONELLE Fuel, MD;  Location: [facility] ENDOSCOPY;  Service: Cardiovascular;  Laterality: N/A;   CORONARY ARTERY BYPASS GRAFT     TONSILLECTOMY AND ADENOIDECTOMY      Social History    Socioeconomic History   Marital status: Married    Spouse name: Not on file   Number of children: 2   Years of education: Not on file   Highest education level: Not on file  Occupational History   Occupation: accountant/ Retired  Tobacco Use   Smoking status: Former    Current packs/day: 0.00    Types: Cigarettes    Quit date: 01/12/1967    Years since quitting: 57.1   Smokeless tobacco: Never  Vaping Use   Vaping status: Never Used  Substance and Sexual Activity   Alcohol use: Yes    Alcohol/week: 28.0 standard drinks of alcohol    Types: 28 Standard drinks or equivalent per week    Comment: .5 bottle of wine daily   Drug use: No   Sexual activity: Yes  Other Topics Concern   Not on file  Social History Narrative   HSG, [employer]. Married -'25, 1 son  '63, 1 daughter '56, 2 grandchildren (4 step-grands).Work: [employer]- corporate rose to news [employer]; [employer]- very successful. 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 Has weathered the recession OK with some adjustments. Advanced Care planning - patient does not want cardiac resuscitation - provided a signed out of facility order for home display (Aug '12) and did review the MOST form, providing an unsigned copy to be used for home discussion with report back.          Social Drivers of Health   Tobacco Use: Medium Risk (02/15/2024)   Patient History    Smoking Tobacco Use: Former    Smokeless Tobacco Use: Never    Passive Exposure: Not on file  Financial Resource Strain: Low Risk (05/24/2022)   Overall Financial Resource Strain (CARDIA)    Difficulty of Paying Living Expenses: Not hard at all  Food Insecurity: No Food Insecurity (05/24/2022)   Hunger Vital Sign    Worried About Running Out of Food in the Last Year: Never true    Ran Out of Food in the Last Year: Never true  Transportation Needs: No Transportation Needs (05/24/2022)   PRAPARE - Administrator, Civil Service (Medical): No    Lack of  Transportation (Non-Medical): No  Physical Activity: Insufficiently Active (05/24/2022)   Exercise Vital Sign    Days of Exercise per Week: 2 days    Minutes of Exercise per Session: 10 min  Stress: No Stress Concern Present (05/24/2022)   [employer] of Occupational Health - Occupational Stress Questionnaire    Feeling of Stress : Only a little  Social Connections: Socially Integrated (05/24/2022)   Social Connection and Isolation Panel    Frequency of Communication with Friends and Family: More than three times a week    Frequency of Social Gatherings with Friends and Family: Once a week    Attends Religious Services: More than 4 times per year    Active Member of [employer] or Organizations: Yes    Attends Engineer, Structural: More than 4 times per year    Marital Status: Married  Catering Manager Violence: Not At  Risk (05/24/2022)   Humiliation, Afraid, Rape, and Kick questionnaire    Fear of Current or Ex-Partner: No    Emotionally Abused: No    Physically Abused: No    Sexually Abused: No  Depression (PHQ2-9): Low Risk (12/26/2023)   Depression (PHQ2-9)    PHQ-2 Score: 0  Alcohol Screen: Low Risk (05/24/2022)   Alcohol Screen    Last Alcohol Screening Score (AUDIT): 1  Housing: Low Risk (05/24/2022)   Housing    Last Housing Risk Score: 0  Utilities: Not At Risk (05/24/2022)   AHC Utilities    Threatened with loss of utilities: No  Health Literacy: Not on file    Family History  Problem Relation Age of Onset   Cancer Father        Deceased, 24   Breast cancer Mother        Deceased, 41   Breast cancer Sister    Colon cancer Daughter        Living   Diabetes Neg Hx    Coronary artery disease Neg Hx      Review of Systems  Constitutional: Negative.  Negative for chills and fever.  HENT: Negative.  Negative for congestion and sore throat.   Respiratory: Negative.  Negative for cough and shortness of breath.   Cardiovascular: Negative.  Negative for chest  pain and palpitations.  Gastrointestinal:  Negative for abdominal pain, nausea and vomiting.  Genitourinary:  Positive for frequency. Negative for dysuria and hematuria.  Skin: Negative.  Negative for rash.  Neurological: Negative.  Negative for dizziness and headaches.  All other systems reviewed and are negative.   Vitals:   02/15/24 1339  BP: 122/78  Pulse: 78  Temp: (!) 97.3 F (36.3 C)  SpO2: 98%    Physical Exam Vitals reviewed.  Constitutional:      Appearance: Normal appearance.  HENT:     Head: Normocephalic.     Mouth/Throat:     Mouth: Mucous membranes are moist.     Pharynx: Oropharynx is clear.  Eyes:     Extraocular Movements: Extraocular movements intact.  Cardiovascular:     Rate and Rhythm: Normal rate.  Pulmonary:     Effort: Pulmonary effort is normal.  Abdominal:     Palpations: Abdomen is soft.     Tenderness: There is no abdominal tenderness.  Skin:    General: Skin is warm and dry.  Neurological:     Mental Status: He is alert and oriented to person, place, and time.  Psychiatric:        Mood and Affect: Mood normal.        Behavior: Behavior normal.    Results for orders placed or performed in visit on 02/15/24 (from the past 24 hours)  POCT URINALYSIS DIP (CLINITEK)     Status: Normal   Collection Time: 02/15/24  1:51 PM  Result Value Ref Range   Color, UA yellow yellow   Clarity, UA clear clear   Glucose, UA negative negative mg/dL   Bilirubin, UA negative negative   Ketones, POC UA negative negative mg/dL   Spec Grav, UA 8.984 8.989 - 1.025   Blood, UA negative negative   pH, UA 6.5 5.0 - 8.0   POC PROTEIN,UA negative negative, trace   Urobilinogen, UA 0.2 0.2 or 1.0 E.U./dL   Nitrite, UA Negative Negative   Leukocytes, UA Negative Negative     ASSESSMENT & PLAN: Problem List Items Addressed This Visit  Other   Frequent urination - Primary   Progressing and getting worse. Clinically stable.  No red flag signs or  symptoms. Presently on Ditropan  10 mg at bedtime and tamsulosin  0.4 mg daily Despite medications, symptoms getting worse Recommend urinalysis and urine culture today Recommend follow-up with urologist Believe he is an established patient with [facility] ED precautions given Advised to contact the office if no better or worse during the next several days.  Also recommend to follow-up with his PCP.      Relevant Orders   POCT URINALYSIS DIP (CLINITEK) (Completed)   Urine Culture   Patient Instructions  Follow-up with your urologist as soon as available Urine today for culture.  Will reach out with results. Also follow-up with your PCP.  Peeing Often (Urinary Frequency) in Adults Urinary frequency means peeing, or urinating, more often than usual. This is also called overactive bladder. People usually pee 6 or 7 times in 24 hours. If you're concerned that you pee more often than usual, or if peeing often is starting to affect your day-to-day life, you may have an overactive bladder. How often you pee depends on: How much you drink and what you drink. How much you exercise. What medicines you're taking. The health of your bladder muscles and pelvic muscles. If you pee often, you may also feel an urgent need to pee. The stress and anxiety of needing to find a bathroom quickly can make this problem feel worse. This problem may go away on its own. You may also wish to seek treatment. Home treatments are the first step in treating the problem. Surgery may be done if home treatment fails. Follow these instructions at home: Bladder health Your health care provider can suggest ways to help you improve your bladder health. You may be told to: Start a bladder diary. Keep track of these things: How often you pee. How much you pee at one time. What you eat and drink. If you leak any pee. Follow a bladder training program. You may be told to: Wait before going to the bathroom. In doing  this, you learn to ignore some of the urges to pee. Wait a minute after you pee and try to pee again. This helps if you feel you are not completely emptying your bladder. Set a time to pee. The goal is to slowly increase the amount of time in which you wait to pee. Do Kegel exercises. These exercises help the muscles that control peeing to become stronger. This may help you control the urge to pee.  Eating and drinking Eat and drink as told. You may be told to: Avoid caffeine. Drink small amounts of fluid more often. Avoid drinking in the evening. Stop drinking alcohol. Avoid foods or drinks that may irritate the bladder, such as: Coffee, tea, or soda. Artificial sweeteners. Citrus fruits, tomato-based foods, and chocolate. Urinary frequency may be worse if you have trouble pooping (constipation). You may need to take these steps to help prevent or treat the problem: Take medicines to help you poop. Eat foods high in fiber, like beans, whole grains, and fresh fruits and vegetables. Drink more fluids as told. General instructions Take your medicines only as told. Keep all follow-up visits. Your provider needs to check if the home treatments are working or if you need more treatment. Contact a health care provider if: You start peeing more than before. You feel pain when you pee. You notice blood in your pee. Your pee looks cloudy. You  have a fever or chills. You throw up or you feel like you may throw up. Get help right away if: You can't pee. This information is not intended to replace advice given to you by your health care provider. Make sure you discuss any questions you have with your health care provider. Document Revised: 10/07/2022 Document Reviewed: 10/07/2022 Elsevier Patient Education  2025 Elsevier Inc.    Emil Schaumann, MD [facility]    [1] No Known Allergies

## 2024-02-15 NOTE — Patient Instructions (Signed)
 Follow-up with your urologist as soon as available Urine today for culture.  Will reach out with results. Also follow-up with your PCP.  Peeing Often (Urinary Frequency) in Adults Urinary frequency means peeing, or urinating, more often than usual. This is also called overactive bladder. People usually pee 6 or 7 times in 24 hours. If you're concerned that you pee more often than usual, or if peeing often is starting to affect your day-to-day life, you may have an overactive bladder. How often you pee depends on: How much you drink and what you drink. How much you exercise. What medicines you're taking. The health of your bladder muscles and pelvic muscles. If you pee often, you may also feel an urgent need to pee. The stress and anxiety of needing to find a bathroom quickly can make this problem feel worse. This problem may go away on its own. You may also wish to seek treatment. Home treatments are the first step in treating the problem. Surgery may be done if home treatment fails. Follow these instructions at home: Bladder health Your health care provider can suggest ways to help you improve your bladder health. You may be told to: Start a bladder diary. Keep track of these things: How often you pee. How much you pee at one time. What you eat and drink. If you leak any pee. Follow a bladder training program. You may be told to: Wait before going to the bathroom. In doing this, you learn to ignore some of the urges to pee. Wait a minute after you pee and try to pee again. This helps if you feel you are not completely emptying your bladder. Set a time to pee. The goal is to slowly increase the amount of time in which you wait to pee. Do Kegel exercises. These exercises help the muscles that control peeing to become stronger. This may help you control the urge to pee.  Eating and drinking Eat and drink as told. You may be told to: Avoid caffeine. Drink small amounts of fluid more  often. Avoid drinking in the evening. Stop drinking alcohol. Avoid foods or drinks that may irritate the bladder, such as: Coffee, tea, or soda. Artificial sweeteners. Citrus fruits, tomato-based foods, and chocolate. Urinary frequency may be worse if you have trouble pooping (constipation). You may need to take these steps to help prevent or treat the problem: Take medicines to help you poop. Eat foods high in fiber, like beans, whole grains, and fresh fruits and vegetables. Drink more fluids as told. General instructions Take your medicines only as told. Keep all follow-up visits. Your provider needs to check if the home treatments are working or if you need more treatment. Contact a health care provider if: You start peeing more than before. You feel pain when you pee. You notice blood in your pee. Your pee looks cloudy. You have a fever or chills. You throw up or you feel like you may throw up. Get help right away if: You can't pee. This information is not intended to replace advice given to you by your health care provider. Make sure you discuss any questions you have with your health care provider. Document Revised: 10/07/2022 Document Reviewed: 10/07/2022 Elsevier Patient Education  2025 [employer].

## 2024-02-15 NOTE — Assessment & Plan Note (Addendum)
 Progressing and getting worse. Clinically stable.  No red flag signs or symptoms. Presently on Ditropan  10 mg at bedtime and tamsulosin  0.4 mg daily Despite medications, symptoms getting worse Recommend urinalysis and urine culture today Recommend follow-up with urologist Believe he is an established patient with [facility] ED precautions given Advised to contact the office if no better or worse during the next several days.  Also recommend to follow-up with his PCP.

## 2024-02-16 LAB — URINE CULTURE: Result:: NO GROWTH
